# Patient Record
Sex: Female | Born: 1965 | Race: White | Hispanic: No | Smoking: Former smoker
Health system: Southern US, Community
[De-identification: ages and names within clinical notes are randomized; demographics above are authoritative.]

## PROBLEM LIST (undated history)

## (undated) DIAGNOSIS — F99 Mental disorder, not otherwise specified: Secondary | ICD-10-CM

## (undated) DIAGNOSIS — Z789 Other specified health status: Secondary | ICD-10-CM

## (undated) DIAGNOSIS — T4145XA Adverse effect of unspecified anesthetic, initial encounter: Secondary | ICD-10-CM

## (undated) DIAGNOSIS — F319 Bipolar disorder, unspecified: Secondary | ICD-10-CM

## (undated) DIAGNOSIS — R519 Headache, unspecified: Secondary | ICD-10-CM

## (undated) DIAGNOSIS — S022XXA Fracture of nasal bones, initial encounter for closed fracture: Secondary | ICD-10-CM

## (undated) DIAGNOSIS — J309 Allergic rhinitis, unspecified: Secondary | ICD-10-CM

## (undated) DIAGNOSIS — D510 Vitamin B12 deficiency anemia due to intrinsic factor deficiency: Secondary | ICD-10-CM

## (undated) DIAGNOSIS — K219 Gastro-esophageal reflux disease without esophagitis: Secondary | ICD-10-CM

## (undated) DIAGNOSIS — D509 Iron deficiency anemia, unspecified: Secondary | ICD-10-CM

## (undated) DIAGNOSIS — M797 Fibromyalgia: Secondary | ICD-10-CM

## (undated) DIAGNOSIS — R51 Headache: Secondary | ICD-10-CM

## (undated) DIAGNOSIS — D563 Thalassemia minor: Secondary | ICD-10-CM

## (undated) DIAGNOSIS — E119 Type 2 diabetes mellitus without complications: Secondary | ICD-10-CM

## (undated) DIAGNOSIS — G4733 Obstructive sleep apnea (adult) (pediatric): Secondary | ICD-10-CM

## (undated) DIAGNOSIS — Z8489 Family history of other specified conditions: Secondary | ICD-10-CM

## (undated) DIAGNOSIS — F419 Anxiety disorder, unspecified: Secondary | ICD-10-CM

## (undated) DIAGNOSIS — E041 Nontoxic single thyroid nodule: Secondary | ICD-10-CM

## (undated) HISTORY — DX: Iron deficiency anemia, unspecified: D50.9

## (undated) HISTORY — DX: Allergic rhinitis, unspecified: J30.9

## (undated) HISTORY — DX: Nontoxic single thyroid nodule: E04.1

## (undated) HISTORY — DX: Vitamin B12 deficiency anemia due to intrinsic factor deficiency: D51.0

## (undated) HISTORY — PX: OTHER SURGICAL HISTORY: SHX169

## (undated) HISTORY — PX: TUBAL LIGATION: SHX77

## (undated) HISTORY — DX: Morbid (severe) obesity due to excess calories: E66.01

## (undated) HISTORY — DX: Obstructive sleep apnea (adult) (pediatric): G47.33

## (undated) HISTORY — PX: WISDOM TOOTH EXTRACTION: SHX21

## (undated) HISTORY — DX: Bipolar disorder, unspecified: F31.9

---

## 1998-07-10 ENCOUNTER — Inpatient Hospital Stay (HOSPITAL_COMMUNITY): Admission: AD | Admit: 1998-07-10 | Discharge: 1998-07-10 | Payer: Self-pay | Admitting: Obstetrics

## 1998-07-18 ENCOUNTER — Inpatient Hospital Stay (HOSPITAL_COMMUNITY): Admission: AD | Admit: 1998-07-18 | Discharge: 1998-07-18 | Payer: Self-pay | Admitting: *Deleted

## 1998-07-22 ENCOUNTER — Inpatient Hospital Stay (HOSPITAL_COMMUNITY): Admission: AD | Admit: 1998-07-22 | Discharge: 1998-07-22 | Payer: Self-pay | Admitting: Obstetrics & Gynecology

## 1998-08-09 ENCOUNTER — Ambulatory Visit (HOSPITAL_COMMUNITY): Admission: RE | Admit: 1998-08-09 | Discharge: 1998-08-09 | Payer: Self-pay | Admitting: Obstetrics

## 1998-08-10 ENCOUNTER — Encounter: Admission: RE | Admit: 1998-08-10 | Discharge: 1998-08-10 | Payer: Self-pay | Admitting: Obstetrics & Gynecology

## 1998-09-06 ENCOUNTER — Ambulatory Visit (HOSPITAL_COMMUNITY): Admission: RE | Admit: 1998-09-06 | Discharge: 1998-09-06 | Payer: Self-pay | Admitting: Obstetrics & Gynecology

## 1998-09-14 ENCOUNTER — Encounter: Admission: RE | Admit: 1998-09-14 | Discharge: 1998-09-14 | Payer: Self-pay | Admitting: Obstetrics & Gynecology

## 1998-10-12 ENCOUNTER — Encounter: Admission: RE | Admit: 1998-10-12 | Discharge: 1998-10-12 | Payer: Self-pay | Admitting: Obstetrics & Gynecology

## 1998-10-26 ENCOUNTER — Other Ambulatory Visit: Admission: RE | Admit: 1998-10-26 | Discharge: 1998-10-26 | Payer: Self-pay | Admitting: Obstetrics & Gynecology

## 1998-10-26 ENCOUNTER — Encounter: Admission: RE | Admit: 1998-10-26 | Discharge: 1998-10-26 | Payer: Self-pay | Admitting: Obstetrics & Gynecology

## 1998-11-10 ENCOUNTER — Ambulatory Visit (HOSPITAL_COMMUNITY): Admission: RE | Admit: 1998-11-10 | Discharge: 1998-11-10 | Payer: Self-pay | Admitting: Obstetrics & Gynecology

## 1998-12-01 ENCOUNTER — Encounter: Admission: RE | Admit: 1998-12-01 | Discharge: 1998-12-01 | Payer: Self-pay | Admitting: Obstetrics

## 1998-12-09 ENCOUNTER — Inpatient Hospital Stay (HOSPITAL_COMMUNITY): Admission: AD | Admit: 1998-12-09 | Discharge: 1998-12-09 | Payer: Self-pay | Admitting: *Deleted

## 1998-12-10 ENCOUNTER — Inpatient Hospital Stay (HOSPITAL_COMMUNITY): Admission: AD | Admit: 1998-12-10 | Discharge: 1998-12-10 | Payer: Self-pay | Admitting: Obstetrics

## 1998-12-15 ENCOUNTER — Encounter: Admission: RE | Admit: 1998-12-15 | Discharge: 1998-12-15 | Payer: Self-pay | Admitting: Obstetrics

## 1998-12-20 ENCOUNTER — Encounter: Payer: Self-pay | Admitting: *Deleted

## 1998-12-20 ENCOUNTER — Inpatient Hospital Stay (HOSPITAL_COMMUNITY): Admission: AD | Admit: 1998-12-20 | Discharge: 1998-12-20 | Payer: Self-pay | Admitting: *Deleted

## 1998-12-22 ENCOUNTER — Encounter: Admission: RE | Admit: 1998-12-22 | Discharge: 1998-12-22 | Payer: Self-pay | Admitting: Obstetrics

## 1998-12-29 ENCOUNTER — Encounter: Admission: RE | Admit: 1998-12-29 | Discharge: 1998-12-29 | Payer: Self-pay | Admitting: Obstetrics

## 1999-01-04 ENCOUNTER — Encounter: Admission: RE | Admit: 1999-01-04 | Discharge: 1999-01-04 | Payer: Self-pay | Admitting: Obstetrics & Gynecology

## 1999-01-06 ENCOUNTER — Inpatient Hospital Stay (HOSPITAL_COMMUNITY): Admission: AD | Admit: 1999-01-06 | Discharge: 1999-01-06 | Payer: Self-pay | Admitting: *Deleted

## 1999-01-09 ENCOUNTER — Inpatient Hospital Stay (HOSPITAL_COMMUNITY): Admission: AD | Admit: 1999-01-09 | Discharge: 1999-01-12 | Payer: Self-pay | Admitting: Obstetrics & Gynecology

## 1999-08-24 ENCOUNTER — Emergency Department (HOSPITAL_COMMUNITY): Admission: EM | Admit: 1999-08-24 | Discharge: 1999-08-24 | Payer: Self-pay | Admitting: Emergency Medicine

## 1999-08-31 ENCOUNTER — Ambulatory Visit (HOSPITAL_COMMUNITY): Admission: RE | Admit: 1999-08-31 | Discharge: 1999-08-31 | Payer: Self-pay | Admitting: Family Medicine

## 2002-05-26 ENCOUNTER — Inpatient Hospital Stay (HOSPITAL_COMMUNITY): Admission: AD | Admit: 2002-05-26 | Discharge: 2002-05-26 | Payer: Self-pay | Admitting: *Deleted

## 2002-07-17 ENCOUNTER — Encounter (INDEPENDENT_AMBULATORY_CARE_PROVIDER_SITE_OTHER): Payer: Self-pay | Admitting: *Deleted

## 2002-07-17 ENCOUNTER — Inpatient Hospital Stay (HOSPITAL_COMMUNITY): Admission: AD | Admit: 2002-07-17 | Discharge: 2002-07-17 | Payer: Self-pay | Admitting: Family Medicine

## 2002-07-30 DIAGNOSIS — T8859XA Other complications of anesthesia, initial encounter: Secondary | ICD-10-CM

## 2002-07-30 HISTORY — DX: Other complications of anesthesia, initial encounter: T88.59XA

## 2002-07-31 ENCOUNTER — Ambulatory Visit (HOSPITAL_COMMUNITY): Admission: RE | Admit: 2002-07-31 | Discharge: 2002-07-31 | Payer: Self-pay | Admitting: *Deleted

## 2002-08-21 ENCOUNTER — Ambulatory Visit (HOSPITAL_COMMUNITY): Admission: RE | Admit: 2002-08-21 | Discharge: 2002-08-21 | Payer: Self-pay | Admitting: *Deleted

## 2002-09-02 ENCOUNTER — Encounter: Admission: RE | Admit: 2002-09-02 | Discharge: 2002-09-02 | Payer: Self-pay | Admitting: *Deleted

## 2002-09-08 ENCOUNTER — Encounter: Admission: RE | Admit: 2002-09-08 | Discharge: 2002-09-08 | Payer: Self-pay | Admitting: *Deleted

## 2002-09-09 ENCOUNTER — Encounter: Admission: RE | Admit: 2002-09-09 | Discharge: 2002-09-09 | Payer: Self-pay | Admitting: *Deleted

## 2002-09-23 ENCOUNTER — Encounter: Admission: RE | Admit: 2002-09-23 | Discharge: 2002-09-23 | Payer: Self-pay | Admitting: *Deleted

## 2002-10-07 ENCOUNTER — Encounter: Admission: RE | Admit: 2002-10-07 | Discharge: 2002-10-07 | Payer: Self-pay | Admitting: *Deleted

## 2002-10-14 ENCOUNTER — Inpatient Hospital Stay (HOSPITAL_COMMUNITY): Admission: AD | Admit: 2002-10-14 | Discharge: 2002-10-14 | Payer: Self-pay | Admitting: *Deleted

## 2002-10-15 ENCOUNTER — Inpatient Hospital Stay (HOSPITAL_COMMUNITY): Admission: AD | Admit: 2002-10-15 | Discharge: 2002-10-15 | Payer: Self-pay | Admitting: Family Medicine

## 2002-10-21 ENCOUNTER — Encounter: Admission: RE | Admit: 2002-10-21 | Discharge: 2002-10-21 | Payer: Self-pay | Admitting: *Deleted

## 2002-11-04 ENCOUNTER — Encounter: Admission: RE | Admit: 2002-11-04 | Discharge: 2002-11-04 | Payer: Self-pay | Admitting: *Deleted

## 2002-11-11 ENCOUNTER — Ambulatory Visit (HOSPITAL_COMMUNITY): Admission: RE | Admit: 2002-11-11 | Discharge: 2002-11-11 | Payer: Self-pay | Admitting: *Deleted

## 2002-11-11 ENCOUNTER — Encounter: Admission: RE | Admit: 2002-11-11 | Discharge: 2002-11-11 | Payer: Self-pay | Admitting: *Deleted

## 2002-11-11 ENCOUNTER — Encounter: Payer: Self-pay | Admitting: *Deleted

## 2002-11-12 ENCOUNTER — Inpatient Hospital Stay (HOSPITAL_COMMUNITY): Admission: AD | Admit: 2002-11-12 | Discharge: 2002-11-15 | Payer: Self-pay | Admitting: Obstetrics and Gynecology

## 2002-11-12 ENCOUNTER — Encounter (INDEPENDENT_AMBULATORY_CARE_PROVIDER_SITE_OTHER): Payer: Self-pay

## 2002-11-17 ENCOUNTER — Inpatient Hospital Stay (HOSPITAL_COMMUNITY): Admission: AD | Admit: 2002-11-17 | Discharge: 2002-11-17 | Payer: Self-pay | Admitting: *Deleted

## 2002-11-19 ENCOUNTER — Inpatient Hospital Stay (HOSPITAL_COMMUNITY): Admission: AD | Admit: 2002-11-19 | Discharge: 2002-11-19 | Payer: Self-pay | Admitting: Family Medicine

## 2002-11-25 ENCOUNTER — Inpatient Hospital Stay (HOSPITAL_COMMUNITY): Admission: AD | Admit: 2002-11-25 | Discharge: 2002-11-25 | Payer: Self-pay | Admitting: Obstetrics and Gynecology

## 2003-01-26 ENCOUNTER — Inpatient Hospital Stay (HOSPITAL_COMMUNITY): Admission: AD | Admit: 2003-01-26 | Discharge: 2003-01-26 | Payer: Self-pay | Admitting: *Deleted

## 2004-05-10 ENCOUNTER — Ambulatory Visit: Payer: Self-pay | Admitting: Family Medicine

## 2004-05-23 ENCOUNTER — Ambulatory Visit: Payer: Self-pay | Admitting: Nurse Practitioner

## 2004-06-13 ENCOUNTER — Ambulatory Visit: Payer: Self-pay | Admitting: Family Medicine

## 2005-04-24 ENCOUNTER — Ambulatory Visit: Payer: Self-pay | Admitting: Internal Medicine

## 2005-05-01 ENCOUNTER — Ambulatory Visit: Payer: Self-pay | Admitting: *Deleted

## 2005-09-06 ENCOUNTER — Ambulatory Visit: Payer: Self-pay | Admitting: Family Medicine

## 2005-11-13 ENCOUNTER — Ambulatory Visit: Payer: Self-pay | Admitting: Family Medicine

## 2005-11-20 ENCOUNTER — Ambulatory Visit: Payer: Self-pay | Admitting: Internal Medicine

## 2005-11-21 ENCOUNTER — Ambulatory Visit (HOSPITAL_COMMUNITY): Admission: RE | Admit: 2005-11-21 | Discharge: 2005-11-21 | Payer: Self-pay | Admitting: Family Medicine

## 2005-12-25 ENCOUNTER — Ambulatory Visit: Payer: Self-pay | Admitting: Family Medicine

## 2005-12-25 ENCOUNTER — Encounter (INDEPENDENT_AMBULATORY_CARE_PROVIDER_SITE_OTHER): Payer: Self-pay | Admitting: *Deleted

## 2005-12-25 ENCOUNTER — Ambulatory Visit: Payer: Self-pay | Admitting: *Deleted

## 2005-12-27 ENCOUNTER — Ambulatory Visit (HOSPITAL_COMMUNITY): Admission: RE | Admit: 2005-12-27 | Discharge: 2005-12-27 | Payer: Self-pay | Admitting: Family Medicine

## 2006-01-07 ENCOUNTER — Ambulatory Visit: Payer: Self-pay | Admitting: Family Medicine

## 2006-01-16 ENCOUNTER — Ambulatory Visit (HOSPITAL_COMMUNITY): Admission: RE | Admit: 2006-01-16 | Discharge: 2006-01-16 | Payer: Self-pay | Admitting: Family Medicine

## 2006-02-15 ENCOUNTER — Emergency Department (HOSPITAL_COMMUNITY): Admission: EM | Admit: 2006-02-15 | Discharge: 2006-02-15 | Payer: Self-pay | Admitting: Emergency Medicine

## 2006-02-18 ENCOUNTER — Ambulatory Visit: Payer: Self-pay | Admitting: Family Medicine

## 2006-02-20 ENCOUNTER — Ambulatory Visit: Payer: Self-pay | Admitting: Family Medicine

## 2006-03-14 ENCOUNTER — Ambulatory Visit (HOSPITAL_COMMUNITY): Admission: RE | Admit: 2006-03-14 | Discharge: 2006-03-14 | Payer: Self-pay | Admitting: Family Medicine

## 2006-03-14 ENCOUNTER — Ambulatory Visit: Payer: Self-pay | Admitting: Family Medicine

## 2006-05-13 ENCOUNTER — Ambulatory Visit: Payer: Self-pay | Admitting: Family Medicine

## 2006-05-15 ENCOUNTER — Ambulatory Visit (HOSPITAL_COMMUNITY): Admission: RE | Admit: 2006-05-15 | Discharge: 2006-05-15 | Payer: Self-pay | Admitting: Family Medicine

## 2006-06-25 ENCOUNTER — Ambulatory Visit: Payer: Self-pay | Admitting: Family Medicine

## 2006-11-21 ENCOUNTER — Ambulatory Visit: Payer: Self-pay | Admitting: Family Medicine

## 2006-12-25 ENCOUNTER — Emergency Department (HOSPITAL_COMMUNITY): Admission: EM | Admit: 2006-12-25 | Discharge: 2006-12-25 | Payer: Self-pay | Admitting: Emergency Medicine

## 2007-03-02 ENCOUNTER — Emergency Department (HOSPITAL_COMMUNITY): Admission: EM | Admit: 2007-03-02 | Discharge: 2007-03-02 | Payer: Self-pay | Admitting: Emergency Medicine

## 2007-03-27 ENCOUNTER — Ambulatory Visit: Payer: Self-pay | Admitting: Family Medicine

## 2007-03-27 LAB — CONVERTED CEMR LAB
Free T4: 1.07 ng/dL (ref 0.89–1.80)
Lithium Lvl: 0.4 meq/L — ABNORMAL LOW (ref 0.80–1.40)
T3, Total: 135.5 ng/dL (ref 60.0–181.0)

## 2007-04-16 ENCOUNTER — Encounter (INDEPENDENT_AMBULATORY_CARE_PROVIDER_SITE_OTHER): Payer: Self-pay | Admitting: *Deleted

## 2007-11-03 ENCOUNTER — Ambulatory Visit: Payer: Self-pay | Admitting: Internal Medicine

## 2007-12-23 ENCOUNTER — Emergency Department (HOSPITAL_COMMUNITY): Admission: EM | Admit: 2007-12-23 | Discharge: 2007-12-23 | Payer: Self-pay | Admitting: Emergency Medicine

## 2008-02-09 ENCOUNTER — Emergency Department (HOSPITAL_COMMUNITY): Admission: EM | Admit: 2008-02-09 | Discharge: 2008-02-09 | Payer: Self-pay | Admitting: Emergency Medicine

## 2008-02-16 ENCOUNTER — Ambulatory Visit: Payer: Self-pay | Admitting: Internal Medicine

## 2008-07-18 ENCOUNTER — Emergency Department (HOSPITAL_COMMUNITY): Admission: EM | Admit: 2008-07-18 | Discharge: 2008-07-18 | Payer: Self-pay | Admitting: Emergency Medicine

## 2008-07-29 ENCOUNTER — Ambulatory Visit: Payer: Self-pay | Admitting: Family Medicine

## 2008-07-29 LAB — CONVERTED CEMR LAB
AST: 21 units/L (ref 0–37)
Albumin: 4 g/dL (ref 3.5–5.2)
Alkaline Phosphatase: 72 units/L (ref 39–117)
HDL: 49 mg/dL (ref >39)
LDL Cholesterol: 84 mg/dL (ref 0–99)
Lithium Lvl: 0.51 meq/L — ABNORMAL LOW (ref 0.80–1.40)
Potassium: 4.6 meq/L (ref 3.5–5.3)
Sodium: 141 meq/L (ref 135–145)
TSH: 2.518 microintl units/mL (ref 0.350–4.50)
Total Bilirubin: 0.5 mg/dL (ref 0.3–1.2)
Total Protein: 7.6 g/dL (ref 6.0–8.3)
VLDL: 21 mg/dL (ref 0–40)

## 2008-08-26 ENCOUNTER — Ambulatory Visit: Payer: Self-pay | Admitting: Internal Medicine

## 2008-08-27 ENCOUNTER — Encounter (INDEPENDENT_AMBULATORY_CARE_PROVIDER_SITE_OTHER): Payer: Self-pay | Admitting: Family Medicine

## 2008-11-20 ENCOUNTER — Emergency Department (HOSPITAL_COMMUNITY): Admission: EM | Admit: 2008-11-20 | Discharge: 2008-11-20 | Payer: Self-pay | Admitting: Emergency Medicine

## 2008-11-24 ENCOUNTER — Ambulatory Visit: Payer: Self-pay | Admitting: Family Medicine

## 2008-11-26 ENCOUNTER — Ambulatory Visit: Payer: Self-pay | Admitting: Family Medicine

## 2008-12-08 ENCOUNTER — Ambulatory Visit (HOSPITAL_COMMUNITY): Admission: RE | Admit: 2008-12-08 | Discharge: 2008-12-08 | Payer: Self-pay | Admitting: Family Medicine

## 2008-12-10 ENCOUNTER — Emergency Department (HOSPITAL_COMMUNITY): Admission: EM | Admit: 2008-12-10 | Discharge: 2008-12-10 | Payer: Self-pay | Admitting: Family Medicine

## 2008-12-13 ENCOUNTER — Encounter: Payer: Self-pay | Admitting: Internal Medicine

## 2008-12-13 ENCOUNTER — Ambulatory Visit: Payer: Self-pay | Admitting: Family Medicine

## 2008-12-13 DIAGNOSIS — F319 Bipolar disorder, unspecified: Secondary | ICD-10-CM | POA: Insufficient documentation

## 2008-12-13 DIAGNOSIS — IMO0001 Reserved for inherently not codable concepts without codable children: Secondary | ICD-10-CM | POA: Insufficient documentation

## 2008-12-21 ENCOUNTER — Ambulatory Visit: Payer: Self-pay | Admitting: Family Medicine

## 2008-12-21 ENCOUNTER — Encounter: Payer: Self-pay | Admitting: Internal Medicine

## 2008-12-22 ENCOUNTER — Ambulatory Visit: Payer: Self-pay | Admitting: Internal Medicine

## 2008-12-22 DIAGNOSIS — R0602 Shortness of breath: Secondary | ICD-10-CM | POA: Insufficient documentation

## 2008-12-26 ENCOUNTER — Emergency Department (HOSPITAL_COMMUNITY): Admission: EM | Admit: 2008-12-26 | Discharge: 2008-12-26 | Payer: Self-pay | Admitting: Family Medicine

## 2009-01-02 ENCOUNTER — Encounter (INDEPENDENT_AMBULATORY_CARE_PROVIDER_SITE_OTHER): Payer: Self-pay | Admitting: Emergency Medicine

## 2009-01-02 ENCOUNTER — Observation Stay (HOSPITAL_COMMUNITY): Admission: EM | Admit: 2009-01-02 | Discharge: 2009-01-02 | Payer: Self-pay | Admitting: Emergency Medicine

## 2009-01-02 ENCOUNTER — Ambulatory Visit: Payer: Self-pay | Admitting: Vascular Surgery

## 2009-01-04 ENCOUNTER — Ambulatory Visit: Payer: Self-pay | Admitting: Family Medicine

## 2009-01-05 ENCOUNTER — Encounter: Payer: Self-pay | Admitting: Internal Medicine

## 2009-01-05 ENCOUNTER — Ambulatory Visit (HOSPITAL_COMMUNITY): Admission: RE | Admit: 2009-01-05 | Discharge: 2009-01-05 | Payer: Self-pay | Admitting: Internal Medicine

## 2009-01-05 ENCOUNTER — Emergency Department (HOSPITAL_COMMUNITY): Admission: EM | Admit: 2009-01-05 | Discharge: 2009-01-05 | Payer: Self-pay | Admitting: Emergency Medicine

## 2009-01-07 ENCOUNTER — Emergency Department (HOSPITAL_COMMUNITY): Admission: EM | Admit: 2009-01-07 | Discharge: 2009-01-07 | Payer: Self-pay | Admitting: Emergency Medicine

## 2009-01-10 ENCOUNTER — Ambulatory Visit: Payer: Self-pay | Admitting: Internal Medicine

## 2009-01-11 ENCOUNTER — Ambulatory Visit: Payer: Self-pay | Admitting: Internal Medicine

## 2009-01-13 ENCOUNTER — Ambulatory Visit (HOSPITAL_BASED_OUTPATIENT_CLINIC_OR_DEPARTMENT_OTHER): Admission: RE | Admit: 2009-01-13 | Discharge: 2009-01-13 | Payer: Self-pay | Admitting: Internal Medicine

## 2009-01-18 ENCOUNTER — Ambulatory Visit: Payer: Self-pay | Admitting: Pulmonary Disease

## 2009-01-19 ENCOUNTER — Emergency Department (HOSPITAL_COMMUNITY): Admission: EM | Admit: 2009-01-19 | Discharge: 2009-01-19 | Payer: Self-pay | Admitting: Emergency Medicine

## 2009-01-21 ENCOUNTER — Ambulatory Visit: Payer: Self-pay | Admitting: Internal Medicine

## 2009-01-23 ENCOUNTER — Emergency Department (HOSPITAL_COMMUNITY): Admission: EM | Admit: 2009-01-23 | Discharge: 2009-01-23 | Payer: Self-pay | Admitting: Emergency Medicine

## 2009-01-25 ENCOUNTER — Telehealth (INDEPENDENT_AMBULATORY_CARE_PROVIDER_SITE_OTHER): Payer: Self-pay | Admitting: *Deleted

## 2009-01-25 ENCOUNTER — Emergency Department (HOSPITAL_COMMUNITY): Admission: EM | Admit: 2009-01-25 | Discharge: 2009-01-25 | Payer: Self-pay | Admitting: Emergency Medicine

## 2009-01-26 ENCOUNTER — Emergency Department (HOSPITAL_COMMUNITY): Admission: EM | Admit: 2009-01-26 | Discharge: 2009-01-26 | Payer: Self-pay | Admitting: Emergency Medicine

## 2009-01-26 ENCOUNTER — Ambulatory Visit: Payer: Self-pay | Admitting: Pulmonary Disease

## 2009-01-26 DIAGNOSIS — G4730 Sleep apnea, unspecified: Secondary | ICD-10-CM | POA: Insufficient documentation

## 2009-01-27 DIAGNOSIS — J309 Allergic rhinitis, unspecified: Secondary | ICD-10-CM | POA: Insufficient documentation

## 2009-01-28 ENCOUNTER — Telehealth: Payer: Self-pay | Admitting: Internal Medicine

## 2009-01-29 ENCOUNTER — Emergency Department (HOSPITAL_COMMUNITY): Admission: EM | Admit: 2009-01-29 | Discharge: 2009-01-29 | Payer: Self-pay | Admitting: Emergency Medicine

## 2009-01-31 ENCOUNTER — Encounter: Payer: Self-pay | Admitting: Pulmonary Disease

## 2009-02-01 ENCOUNTER — Ambulatory Visit: Payer: Self-pay | Admitting: Internal Medicine

## 2009-02-01 ENCOUNTER — Telehealth (INDEPENDENT_AMBULATORY_CARE_PROVIDER_SITE_OTHER): Payer: Self-pay | Admitting: *Deleted

## 2009-02-14 ENCOUNTER — Encounter: Payer: Self-pay | Admitting: Pulmonary Disease

## 2009-02-21 ENCOUNTER — Ambulatory Visit: Payer: Self-pay | Admitting: Internal Medicine

## 2009-02-24 ENCOUNTER — Observation Stay (HOSPITAL_COMMUNITY): Admission: EM | Admit: 2009-02-24 | Discharge: 2009-02-25 | Payer: Self-pay | Admitting: Emergency Medicine

## 2009-02-24 ENCOUNTER — Ambulatory Visit: Payer: Self-pay | Admitting: Family Medicine

## 2009-02-28 ENCOUNTER — Ambulatory Visit (HOSPITAL_COMMUNITY): Admission: RE | Admit: 2009-02-28 | Discharge: 2009-02-28 | Payer: Self-pay | Admitting: Internal Medicine

## 2009-02-28 ENCOUNTER — Encounter: Payer: Self-pay | Admitting: Internal Medicine

## 2009-03-01 ENCOUNTER — Ambulatory Visit: Payer: Self-pay | Admitting: Family Medicine

## 2009-03-03 ENCOUNTER — Ambulatory Visit: Payer: Self-pay | Admitting: Pulmonary Disease

## 2009-03-04 ENCOUNTER — Ambulatory Visit: Payer: Self-pay | Admitting: Internal Medicine

## 2009-03-04 DIAGNOSIS — E041 Nontoxic single thyroid nodule: Secondary | ICD-10-CM | POA: Insufficient documentation

## 2009-03-09 ENCOUNTER — Ambulatory Visit (HOSPITAL_COMMUNITY): Admission: RE | Admit: 2009-03-09 | Discharge: 2009-03-09 | Payer: Self-pay | Admitting: Internal Medicine

## 2009-03-14 ENCOUNTER — Telehealth: Payer: Self-pay | Admitting: Internal Medicine

## 2009-03-15 ENCOUNTER — Telehealth: Payer: Self-pay | Admitting: Internal Medicine

## 2009-03-16 ENCOUNTER — Telehealth (INDEPENDENT_AMBULATORY_CARE_PROVIDER_SITE_OTHER): Payer: Self-pay | Admitting: *Deleted

## 2009-03-17 ENCOUNTER — Telehealth: Payer: Self-pay | Admitting: Pulmonary Disease

## 2009-03-17 ENCOUNTER — Encounter: Payer: Self-pay | Admitting: Pulmonary Disease

## 2009-03-23 ENCOUNTER — Ambulatory Visit: Admission: RE | Admit: 2009-03-23 | Discharge: 2009-03-23 | Payer: Self-pay | Admitting: Internal Medicine

## 2009-03-24 ENCOUNTER — Encounter: Payer: Self-pay | Admitting: Internal Medicine

## 2009-03-24 DIAGNOSIS — J383 Other diseases of vocal cords: Secondary | ICD-10-CM | POA: Insufficient documentation

## 2009-04-01 ENCOUNTER — Ambulatory Visit (HOSPITAL_COMMUNITY): Admission: RE | Admit: 2009-04-01 | Discharge: 2009-04-01 | Payer: Self-pay | Admitting: Family Medicine

## 2009-04-01 ENCOUNTER — Encounter (INDEPENDENT_AMBULATORY_CARE_PROVIDER_SITE_OTHER): Payer: Self-pay | Admitting: Interventional Radiology

## 2009-04-01 ENCOUNTER — Encounter: Payer: Self-pay | Admitting: Internal Medicine

## 2009-04-14 ENCOUNTER — Telehealth (INDEPENDENT_AMBULATORY_CARE_PROVIDER_SITE_OTHER): Payer: Self-pay | Admitting: *Deleted

## 2009-04-14 ENCOUNTER — Ambulatory Visit: Payer: Self-pay | Admitting: Family Medicine

## 2009-04-16 ENCOUNTER — Emergency Department (HOSPITAL_COMMUNITY): Admission: EM | Admit: 2009-04-16 | Discharge: 2009-04-16 | Payer: Self-pay | Admitting: Emergency Medicine

## 2009-04-22 ENCOUNTER — Encounter (INDEPENDENT_AMBULATORY_CARE_PROVIDER_SITE_OTHER): Payer: Self-pay | Admitting: Adult Health

## 2009-04-22 ENCOUNTER — Ambulatory Visit: Payer: Self-pay | Admitting: Internal Medicine

## 2009-04-22 ENCOUNTER — Telehealth (INDEPENDENT_AMBULATORY_CARE_PROVIDER_SITE_OTHER): Payer: Self-pay | Admitting: *Deleted

## 2009-04-22 LAB — CONVERTED CEMR LAB
Basophils Absolute: 0 10*3/uL (ref 0.0–0.1)
Eosinophils Absolute: 0.1 10*3/uL (ref 0.0–0.7)
Eosinophils Relative: 1 % (ref 0–5)
Lymphs Abs: 1.2 10*3/uL (ref 0.7–4.0)
MCV: 79.2 fL (ref 78.0–100.0)
Neutrophils Relative %: 80 % — ABNORMAL HIGH (ref 43–77)
Platelets: 341 10*3/uL (ref 150–400)
RDW: 14.4 % (ref 11.5–15.5)
WBC: 9.8 10*3/uL (ref 4.0–10.5)

## 2009-04-24 ENCOUNTER — Encounter (INDEPENDENT_AMBULATORY_CARE_PROVIDER_SITE_OTHER): Payer: Self-pay | Admitting: Emergency Medicine

## 2009-04-24 ENCOUNTER — Emergency Department (HOSPITAL_COMMUNITY): Admission: EM | Admit: 2009-04-24 | Discharge: 2009-04-24 | Payer: Self-pay | Admitting: Emergency Medicine

## 2009-04-26 ENCOUNTER — Ambulatory Visit: Payer: Self-pay | Admitting: Internal Medicine

## 2009-04-27 ENCOUNTER — Ambulatory Visit: Payer: Self-pay | Admitting: Vascular Surgery

## 2009-04-28 ENCOUNTER — Ambulatory Visit: Payer: Self-pay | Admitting: Family Medicine

## 2009-04-29 ENCOUNTER — Ambulatory Visit: Payer: Self-pay | Admitting: Family Medicine

## 2009-05-04 ENCOUNTER — Telehealth: Payer: Self-pay | Admitting: Internal Medicine

## 2009-05-05 ENCOUNTER — Ambulatory Visit: Payer: Self-pay | Admitting: Pulmonary Disease

## 2009-05-10 ENCOUNTER — Inpatient Hospital Stay (HOSPITAL_COMMUNITY): Admission: AD | Admit: 2009-05-10 | Discharge: 2009-05-10 | Payer: Self-pay | Admitting: Obstetrics & Gynecology

## 2009-05-11 ENCOUNTER — Encounter (INDEPENDENT_AMBULATORY_CARE_PROVIDER_SITE_OTHER): Payer: Self-pay | Admitting: *Deleted

## 2009-05-11 ENCOUNTER — Emergency Department (HOSPITAL_COMMUNITY): Admission: EM | Admit: 2009-05-11 | Discharge: 2009-05-11 | Payer: Self-pay | Admitting: Emergency Medicine

## 2009-05-12 ENCOUNTER — Telehealth (INDEPENDENT_AMBULATORY_CARE_PROVIDER_SITE_OTHER): Payer: Self-pay | Admitting: *Deleted

## 2009-05-12 ENCOUNTER — Ambulatory Visit: Payer: Self-pay | Admitting: Family Medicine

## 2009-05-13 ENCOUNTER — Telehealth (INDEPENDENT_AMBULATORY_CARE_PROVIDER_SITE_OTHER): Payer: Self-pay | Admitting: *Deleted

## 2009-06-17 ENCOUNTER — Telehealth (INDEPENDENT_AMBULATORY_CARE_PROVIDER_SITE_OTHER): Payer: Self-pay | Admitting: *Deleted

## 2009-06-24 ENCOUNTER — Emergency Department (HOSPITAL_COMMUNITY): Admission: EM | Admit: 2009-06-24 | Discharge: 2009-06-24 | Payer: Self-pay | Admitting: Family Medicine

## 2009-06-28 ENCOUNTER — Ambulatory Visit: Payer: Self-pay | Admitting: Family Medicine

## 2009-06-28 ENCOUNTER — Ambulatory Visit (HOSPITAL_COMMUNITY): Admission: RE | Admit: 2009-06-28 | Discharge: 2009-06-28 | Payer: Self-pay | Admitting: Family Medicine

## 2009-07-06 ENCOUNTER — Telehealth: Payer: Self-pay | Admitting: Internal Medicine

## 2009-07-06 ENCOUNTER — Encounter: Admission: RE | Admit: 2009-07-06 | Discharge: 2009-07-27 | Payer: Self-pay | Admitting: Internal Medicine

## 2009-07-06 ENCOUNTER — Encounter (INDEPENDENT_AMBULATORY_CARE_PROVIDER_SITE_OTHER): Payer: Self-pay | Admitting: *Deleted

## 2009-07-06 ENCOUNTER — Ambulatory Visit: Payer: Self-pay | Admitting: Internal Medicine

## 2009-07-06 DIAGNOSIS — K900 Celiac disease: Secondary | ICD-10-CM | POA: Insufficient documentation

## 2009-07-06 DIAGNOSIS — J019 Acute sinusitis, unspecified: Secondary | ICD-10-CM | POA: Insufficient documentation

## 2009-07-08 ENCOUNTER — Telehealth: Payer: Self-pay | Admitting: Internal Medicine

## 2009-07-08 ENCOUNTER — Telehealth (INDEPENDENT_AMBULATORY_CARE_PROVIDER_SITE_OTHER): Payer: Self-pay | Admitting: *Deleted

## 2009-07-13 ENCOUNTER — Telehealth (INDEPENDENT_AMBULATORY_CARE_PROVIDER_SITE_OTHER): Payer: Self-pay | Admitting: *Deleted

## 2009-08-01 ENCOUNTER — Ambulatory Visit: Payer: Self-pay | Admitting: Internal Medicine

## 2009-08-01 ENCOUNTER — Telehealth (INDEPENDENT_AMBULATORY_CARE_PROVIDER_SITE_OTHER): Payer: Self-pay | Admitting: *Deleted

## 2009-08-01 ENCOUNTER — Encounter: Admission: RE | Admit: 2009-08-01 | Discharge: 2009-10-30 | Payer: Self-pay | Admitting: Internal Medicine

## 2009-08-05 ENCOUNTER — Ambulatory Visit: Payer: Self-pay | Admitting: Gastroenterology

## 2009-08-05 DIAGNOSIS — R197 Diarrhea, unspecified: Secondary | ICD-10-CM | POA: Insufficient documentation

## 2009-08-05 DIAGNOSIS — F1020 Alcohol dependence, uncomplicated: Secondary | ICD-10-CM | POA: Insufficient documentation

## 2009-08-05 LAB — CONVERTED CEMR LAB
ALT: 37 units/L — ABNORMAL HIGH (ref 0–35)
AST: 30 units/L (ref 0–37)
Basophils Absolute: 0 10*3/uL (ref 0.0–0.1)
CO2: 27 meq/L (ref 19–32)
Calcium: 9.1 mg/dL (ref 8.4–10.5)
Chloride: 111 meq/L (ref 96–112)
Creatinine, Ser: 0.5 mg/dL (ref 0.4–1.2)
Eosinophils Absolute: 0.1 10*3/uL (ref 0.0–0.7)
GFR calc non Af Amer: 143.92 mL/min (ref >60)
Hemoglobin: 11.5 g/dL — ABNORMAL LOW (ref 12.0–15.0)
Lymphocytes Relative: 11.9 % — ABNORMAL LOW (ref 12.0–46.0)
MCHC: 32.4 g/dL (ref 30.0–36.0)
Monocytes Relative: 2.8 % — ABNORMAL LOW (ref 3.0–12.0)
Neutrophils Relative %: 83.8 % — ABNORMAL HIGH (ref 43.0–77.0)
Platelets: 269 10*3/uL (ref 150.0–400.0)
Potassium: 4.4 meq/L (ref 3.5–5.1)
RDW: 15.6 % — ABNORMAL HIGH (ref 11.5–14.6)
Sodium: 142 meq/L (ref 135–145)
Total Protein: 7.6 g/dL (ref 6.0–8.3)
aPTT: 31 s — ABNORMAL HIGH

## 2009-08-10 ENCOUNTER — Ambulatory Visit: Payer: Self-pay | Admitting: Emergency Medicine

## 2009-08-10 ENCOUNTER — Telehealth (INDEPENDENT_AMBULATORY_CARE_PROVIDER_SITE_OTHER): Payer: Self-pay | Admitting: *Deleted

## 2009-08-10 DIAGNOSIS — J209 Acute bronchitis, unspecified: Secondary | ICD-10-CM | POA: Insufficient documentation

## 2009-08-12 ENCOUNTER — Encounter: Payer: Self-pay | Admitting: Internal Medicine

## 2009-08-14 ENCOUNTER — Emergency Department (HOSPITAL_COMMUNITY): Admission: EM | Admit: 2009-08-14 | Discharge: 2009-08-14 | Payer: Self-pay | Admitting: Emergency Medicine

## 2009-08-16 ENCOUNTER — Telehealth: Payer: Self-pay | Admitting: Emergency Medicine

## 2009-08-22 ENCOUNTER — Encounter: Payer: Self-pay | Admitting: Internal Medicine

## 2009-08-26 ENCOUNTER — Encounter: Payer: Self-pay | Admitting: Adult Health

## 2009-08-31 ENCOUNTER — Ambulatory Visit: Payer: Self-pay | Admitting: Internal Medicine

## 2009-09-02 ENCOUNTER — Telehealth: Payer: Self-pay | Admitting: Internal Medicine

## 2009-09-05 ENCOUNTER — Ambulatory Visit: Payer: Self-pay | Admitting: Internal Medicine

## 2009-09-17 ENCOUNTER — Emergency Department (HOSPITAL_COMMUNITY): Admission: EM | Admit: 2009-09-17 | Discharge: 2009-09-17 | Payer: Self-pay | Admitting: Emergency Medicine

## 2009-09-19 ENCOUNTER — Emergency Department (HOSPITAL_COMMUNITY): Admission: EM | Admit: 2009-09-19 | Discharge: 2009-09-19 | Payer: Self-pay | Admitting: Emergency Medicine

## 2009-09-19 ENCOUNTER — Telehealth (INDEPENDENT_AMBULATORY_CARE_PROVIDER_SITE_OTHER): Payer: Self-pay | Admitting: *Deleted

## 2009-09-20 ENCOUNTER — Ambulatory Visit: Payer: Self-pay | Admitting: Family Medicine

## 2009-09-30 ENCOUNTER — Ambulatory Visit: Payer: Self-pay | Admitting: Internal Medicine

## 2009-09-30 DIAGNOSIS — R21 Rash and other nonspecific skin eruption: Secondary | ICD-10-CM | POA: Insufficient documentation

## 2009-09-30 LAB — CONVERTED CEMR LAB: IgE (Immunoglobulin E), Serum: 12.2 intl units/mL (ref 0.0–180.0)

## 2009-11-11 ENCOUNTER — Ambulatory Visit: Payer: Self-pay | Admitting: Family Medicine

## 2009-11-15 ENCOUNTER — Ambulatory Visit: Payer: Self-pay | Admitting: Family Medicine

## 2009-11-15 LAB — CONVERTED CEMR LAB
AST: 21 units/L (ref 0–37)
Albumin: 4.1 g/dL (ref 3.5–5.2)
Alkaline Phosphatase: 99 units/L (ref 39–117)
Basophils Absolute: 0 10*3/uL (ref 0.0–0.1)
Calcium: 8.7 mg/dL (ref 8.4–10.5)
Chloride: 112 meq/L (ref 96–112)
HCT: 38.3 % (ref 36.0–46.0)
Hemoglobin: 12 g/dL (ref 12.0–15.0)
Lymphocytes Relative: 12 % (ref 12–46)
Lymphs Abs: 0.8 10*3/uL (ref 0.7–4.0)
Monocytes Absolute: 0.4 10*3/uL (ref 0.1–1.0)
Neutro Abs: 5.5 10*3/uL (ref 1.7–7.7)
Platelets: 270 10*3/uL (ref 150–400)
Potassium: 4.3 meq/L (ref 3.5–5.3)
RDW: 16 % — ABNORMAL HIGH (ref 11.5–15.5)
Sodium: 140 meq/L (ref 135–145)
Total Protein: 7.1 g/dL (ref 6.0–8.3)
Vit D, 25-Hydroxy: 27 ng/mL — ABNORMAL LOW (ref 30–89)
WBC: 6.9 10*3/uL (ref 4.0–10.5)

## 2009-11-23 ENCOUNTER — Telehealth (INDEPENDENT_AMBULATORY_CARE_PROVIDER_SITE_OTHER): Payer: Self-pay | Admitting: *Deleted

## 2009-11-29 ENCOUNTER — Ambulatory Visit: Payer: Self-pay | Admitting: Family Medicine

## 2009-11-30 ENCOUNTER — Telehealth (INDEPENDENT_AMBULATORY_CARE_PROVIDER_SITE_OTHER): Payer: Self-pay | Admitting: *Deleted

## 2009-11-30 ENCOUNTER — Ambulatory Visit: Payer: Self-pay | Admitting: Internal Medicine

## 2009-12-02 ENCOUNTER — Telehealth: Payer: Self-pay | Admitting: Internal Medicine

## 2009-12-06 ENCOUNTER — Encounter: Admission: RE | Admit: 2009-12-06 | Discharge: 2009-12-06 | Payer: Self-pay | Admitting: Family Medicine

## 2010-01-04 ENCOUNTER — Emergency Department (HOSPITAL_COMMUNITY): Admission: EM | Admit: 2010-01-04 | Discharge: 2010-01-05 | Payer: Self-pay | Admitting: Emergency Medicine

## 2010-01-16 ENCOUNTER — Inpatient Hospital Stay (HOSPITAL_COMMUNITY): Admission: AD | Admit: 2010-01-16 | Discharge: 2010-01-16 | Payer: Self-pay | Admitting: Obstetrics & Gynecology

## 2010-01-16 ENCOUNTER — Ambulatory Visit: Payer: Self-pay | Admitting: Advanced Practice Midwife

## 2010-01-18 ENCOUNTER — Ambulatory Visit: Payer: Self-pay | Admitting: Family Medicine

## 2010-01-26 ENCOUNTER — Ambulatory Visit: Payer: Self-pay | Admitting: Family Medicine

## 2010-01-26 ENCOUNTER — Ambulatory Visit (HOSPITAL_COMMUNITY): Admission: RE | Admit: 2010-01-26 | Discharge: 2010-01-26 | Payer: Self-pay | Admitting: Family Medicine

## 2010-01-31 ENCOUNTER — Emergency Department (HOSPITAL_COMMUNITY): Admission: EM | Admit: 2010-01-31 | Discharge: 2010-01-31 | Payer: Self-pay | Admitting: Family Medicine

## 2010-02-03 ENCOUNTER — Ambulatory Visit: Payer: Self-pay | Admitting: Family Medicine

## 2010-02-10 ENCOUNTER — Ambulatory Visit: Payer: Self-pay | Admitting: Internal Medicine

## 2010-03-04 ENCOUNTER — Emergency Department (HOSPITAL_COMMUNITY): Admission: EM | Admit: 2010-03-04 | Discharge: 2010-03-04 | Payer: Self-pay | Admitting: Emergency Medicine

## 2010-03-11 ENCOUNTER — Emergency Department (HOSPITAL_COMMUNITY): Admission: EM | Admit: 2010-03-11 | Discharge: 2010-03-11 | Payer: Self-pay | Admitting: Emergency Medicine

## 2010-03-13 ENCOUNTER — Ambulatory Visit: Payer: Self-pay | Admitting: Internal Medicine

## 2010-03-14 ENCOUNTER — Telehealth (INDEPENDENT_AMBULATORY_CARE_PROVIDER_SITE_OTHER): Payer: Self-pay | Admitting: *Deleted

## 2010-03-23 ENCOUNTER — Ambulatory Visit: Payer: Self-pay | Admitting: Family Medicine

## 2010-03-23 ENCOUNTER — Telehealth (INDEPENDENT_AMBULATORY_CARE_PROVIDER_SITE_OTHER): Payer: Self-pay | Admitting: *Deleted

## 2010-03-29 ENCOUNTER — Ambulatory Visit: Payer: Self-pay | Admitting: Internal Medicine

## 2010-03-29 DIAGNOSIS — R05 Cough: Secondary | ICD-10-CM

## 2010-03-29 DIAGNOSIS — R059 Cough, unspecified: Secondary | ICD-10-CM | POA: Insufficient documentation

## 2010-03-30 ENCOUNTER — Telehealth (INDEPENDENT_AMBULATORY_CARE_PROVIDER_SITE_OTHER): Payer: Self-pay | Admitting: *Deleted

## 2010-04-05 ENCOUNTER — Telehealth (INDEPENDENT_AMBULATORY_CARE_PROVIDER_SITE_OTHER): Payer: Self-pay | Admitting: *Deleted

## 2010-04-19 ENCOUNTER — Ambulatory Visit: Payer: Self-pay | Admitting: Pulmonary Disease

## 2010-04-19 ENCOUNTER — Telehealth: Payer: Self-pay | Admitting: Internal Medicine

## 2010-05-07 ENCOUNTER — Emergency Department (HOSPITAL_COMMUNITY): Admission: EM | Admit: 2010-05-07 | Discharge: 2010-05-07 | Payer: Self-pay | Admitting: Emergency Medicine

## 2010-05-17 ENCOUNTER — Ambulatory Visit: Payer: Self-pay | Admitting: Internal Medicine

## 2010-06-21 ENCOUNTER — Emergency Department (HOSPITAL_COMMUNITY)
Admission: EM | Admit: 2010-06-21 | Discharge: 2010-06-21 | Payer: Self-pay | Source: Home / Self Care | Admitting: Family Medicine

## 2010-07-06 ENCOUNTER — Emergency Department (HOSPITAL_COMMUNITY): Admission: EM | Admit: 2010-07-06 | Discharge: 2009-09-07 | Payer: Self-pay | Admitting: Emergency Medicine

## 2010-08-31 NOTE — Miscellaneous (Signed)
Summary: Skin Test/[facility]  Skin Test/[facility]   Imported By: Sherian Rein 12/08/2009 07:21:54  _____________________________________________________________________  External Attachment:    Type:   Image     Comment:   External Document

## 2010-08-31 NOTE — Assessment & Plan Note (Signed)
Summary: Acute NP office visit - dyspnea   Copy to:  Kalman Shan, MD  Primary Jahkari Maclin/Referring Han Lysne:  [facility]   CC:  increased SOB, dry cough, and chest congestion with occ off-white colored mucus x29months - worse recently.  History of Present Illness: September 30, 2009 44 yoF Bipolar,followed at [facility] and referred now by Dr Marchelle Gearing for allergy evaluation. Hx of OSA and vocal cord dysfunction. Her complaint is of nose bleeds, sinus congestion, feeling run down and pains in chest, recurrent rashes with mouth blisters. Perennial postnasal drip worse in Spring and Fall. Triggers include strong odors and irritants. She suspects some undefined foods and is trying now to eat Vegan. Stays on benadryl to suppress rash and throat drainage. Latex contact dermatitis. Mother atopic- helped by allergy vaccine. Son with allergies to grass pollen and eggs. Mecholyl study negative. Celiac work up negative but choses to be on gluten free diet. Dr Vassie Loll manages for OSA on CPAP.  No hx ENT surgery. House, new to them x 2 years. Dogs. No basement, carpet or mold. CA plus airfilter.  Nov 30, 2009- Allergic rhintis, bronchitis.  Increased head and chest congestion off antihistamines this week. Skin test- limited positives for ragweed and some trees. As we finished today, she mentioned concern of possible stinging insect reaction in the past, without specific history.   March 13, 2010--Presents for increased SOB, dry cough, chest congestion with occ off-white colored mucus x80months - worse recently. Has frequent flare of dry cough and wheezing, Has [facility]. ER notes show neg xray last week. She has  lots of triggers lately to dust and smoke. I explained her work in past w/ neg MCT, nml spirometry, allergy eval. . She says these episodes are controlling her life. We talked about trigger prevention. Denies chest pain,  orthopnea,  hemoptysis, fever, n/v/d, edema, headache.   Medications Prior to Update: 1)  Benadryl 25 Mg Caps (Diphenhydramine Hcl) .Marland Kitchen.. 1-2 Tablets Every 4 Hours As Needed 2)  Clonazepam 0.5 Mg Tabs (Clonazepam) .Marland Kitchen.. 1 Tab By Mouth Twice Daily As Needed Anxiety 3)  Aspirin 325 Mg Tabs (Aspirin) .... 2 Tablets Every 4-6 Hours As Needed 4)  Ra Daytime Cold/flu 30-325-10 Mg/79ml Liqd (Pseudoephedrine-Apap-Dm) .... As Directed On Package 5)  Epipen 0.3 Mg/0.12ml Devi (Epinephrine) .... As Needed For Severe Allergic Reaction 6)  Nasal Moisturizer 0.65 % Soln (Saline) .... As Needed 7)  Levothyroxine Sodium 50 Mcg Tabs (Levothyroxine Sodium) .... Take 1 Tablet By Mouth Once A Day  Current Medications (verified): 1)  Benadryl 25 Mg Caps (Diphenhydramine Hcl) .Marland Kitchen.. 1-2 Tablets Every 4 Hours As Needed 2)  Clonazepam 0.5 Mg Tabs (Clonazepam) .Marland Kitchen.. 1 Tab By Mouth Twice Daily As Needed Anxiety 3)  Aspirin 325 Mg Tabs (Aspirin) .... 2 Tablets Every 4-6 Hours As Needed 4)  Ra Daytime Cold/flu 30-325-10 Mg/33ml Liqd (Pseudoephedrine-Apap-Dm) .... As Directed On Package 5)  Epipen 0.3 Mg/0.71ml Devi (Epinephrine) .... As Needed For Severe Allergic Reaction 6)  Nasal Moisturizer 0.65 % Soln (Saline) .... As Needed 7)  Levothyroxine Sodium 50 Mcg Tabs (Levothyroxine Sodium) .... Take 1 Tablet By Mouth Once A Day 8)  Loratadine 10 Mg Tabs (Loratadine) .... Take 1 Tablet By Mouth Two Times A Day 9)  Topamax 100 Mg Tabs (Topiramate) .... Take 1 Tablet By Mouth Two Times A Day 10)  Mucinex 600 Mg Xr12h-Tab (Guaifenesin) .Marland Kitchen.. 1 Tab Every 4 Hours As  Needed As Directed By Dr. Audria Nine 11)  Ventolin Hfa 108 (90 Base) Mcg/act Aers (Albuterol Sulfate) .... Inhale 2 Puffs Every Four Hours As Needed  Allergies: 1)  ! Wellbutrin 2)  ! Celexa  Past History:  Past Medical History: Last updated: 11/30/2009 #"ASTHMA" and DYSPNEA > acute visits 4/24 to ER, [facility], [facility],  12/10/2008 to er >Normal CXR  11/20/2008, 02/09/2008, 12/23/2007 -  >Spiro 12/08/2008: FeV1 2.68L/82%, FVC 3.3L.78%, Ratio 81 (82), Fef 25-75%: 3.4/96% >negative methacholine challenge test July 2010 >Normal CT chest 02/24/2009 (PE ruled out during ER visit and hospitalization) >CPST 02/28/2009: obesity as primary cause for dyspnea. NEgative test for exercise induced bronchospasm > [place] 03/23/2009 - VCD +  #ALLERGY SINUS TROUBLE- Skin test 11/30/09 #BIPOLAR/PTSD/OCD #MORBID OBESITY - BMI 49 > 360# in June 2010 -> 291# FEb 2011 #Moderate Sleep APnea Abnormal Sleep Study-[facility]-started on CPAP-12/2008.  #Diffuse Hepatic STeatosis >seen on Ct chest july 2010 #Thyroid Nodule on CT chest july 2010 > s/p bx 04/28/2009 - lymphocytes, histiocytes, and debris  Past Surgical History: Last updated: 08/05/2009 s/p tubal ligation 11/12/2002 C sections x 2  Family History: Last updated: 08/05/2009 Family History Breast Cancer--mother and mgm Family History MI/Heart Attack--pgf Family History Emphysema --pgm Family History Asthma--mother Family History Hypertension--father son - VCD Family History of Irritable Bowel Syndrome:Mother  Family History of Diabetes: Father  No FH of Colon Cancer: Family History of Colon Polyps:Mother   Social History: Last updated: 09/30/2009 Lives with husband, 5 boys, 1 girl, 4 dogs Unemployed Patient states former smoker. quit 2008 Alcohol Use - no Daily Caffeine Use: 2 daily  Illicit Drug Use - no  Risk Factors: Alcohol Use: 0 (12/13/2008) Caffeine Use: 1 (12/13/2008) Exercise: no (12/13/2008)  Risk Factors: Smoking Status: quit (01/26/2009)  Review of Systems      See HPI  Vital Signs:  Patient profile:   45 year old female Menstrual status:  regular Height:      72 inches Weight:      274.38 pounds BMI:     37.35 O2 Sat:      97 % on Room air Temp:     97.9 degrees F oral Pulse rate:   105 / minute BP sitting:   138 / 76  (right arm) Cuff size:   large  Vitals Entered By:  Boone Master CNA/MA (March 13, 2010 11:53 AM)  O2 Flow:  Room air CC: increased SOB, dry cough, chest congestion with occ off-white colored mucus x25months - worse recently Is Patient Diabetic? No Comments Medications reviewed with patient Daytime contact number verified with patient. Boone Master CNA/MA  March 13, 2010 11:54 AM    Physical Exam  Additional Exam:  General: A/Ox3; pleasant and cooperative, NAD, obese  SKIN: no rash, lesions NODES: no lymphadenopathy HEENT: [place]/AT, EOM- WNL, Conjuctivae- clear, PERRLA, TM-WNL, Nose- clear, Throat- clear and wnl, Mallampati  II,  NECK: Supple w/ fair ROM, JVD- none, normal carotid impulses w/o bruits Thyroid- , no stridor CHEST: Clear to P&A, no wheezing or psuedowheezing.  HEART: RRR, no m/g/r heard ABDOMEN: Soft and nl;  EAV:WUJW, nl pulses, no edema  NEURO: Grossly intact to observation      Impression & Recommendations:  Problem # 1:  DISEASE - VOCAL CORD NEC (ICD-478.5)  Extensive work up w/ no asthma found, however pt with multiple exacerbation w/ ER treatments.  Will try low dose ICS to see if this may have some preventive measures for her.  also add for  triggers of PND, and reflux.  close follow up with Dr. Marchelle Gearing  REC:  Begin Astepro 2 puffs at bedtime  Trial of QVAR 2 puffs two times a day -brush/rinse/gargle  Sugarless candy, ice chips, water to avoid throat clearing and coughing  Take claritin once daily  Use benadryl as needed drainage.  Begin Dexilant 60mg  once daily  follow up Dr. Marchelle Gearing in 2 weeks. and as needed  Please contact office for sooner follow up if symptoms do not improve or worsen  '  Orders: Est. Patient Level III (16109)  Medications Added to Medication List This Visit: 1)  Loratadine 10 Mg Tabs (Loratadine) .... Take 1 tablet by mouth two times a day 2)  Topamax 100 Mg Tabs (Topiramate) .... Take 1 tablet by mouth two times a day 3)  Mucinex 600 Mg Xr12h-tab (Guaifenesin)  .Marland Kitchen.. 1 tab every 4 hours as needed as directed by dr. Audria Nine 4)  Ventolin Hfa 108 (90 Base) Mcg/act Aers (Albuterol sulfate) .... Inhale 2 puffs every four hours as needed 5)  Qvar 40 Mcg/act Aers (Beclomethasone dipropionate) .... 2 puffs two times a day 6)  Astepro 0.15 % Soln (Azelastine hcl) .... 2 puffs at bedtime  Complete Medication List: 1)  Benadryl 25 Mg Caps (Diphenhydramine hcl) .Marland Kitchen.. 1-2 tablets every 4 hours as needed 2)  Clonazepam 0.5 Mg Tabs (Clonazepam) .Marland Kitchen.. 1 tab by mouth twice daily as needed anxiety 3)  Aspirin 325 Mg Tabs (Aspirin) .... 2 tablets every 4-6 hours as needed 4)  Ra Daytime Cold/flu 30-325-10 Mg/65ml Liqd (Pseudoephedrine-apap-dm) .... As directed on package 5)  Epipen 0.3 Mg/0.34ml Devi (Epinephrine) .... As needed for severe allergic reaction 6)  Nasal Moisturizer 0.65 % Soln (Saline) .... As needed 7)  Levothyroxine Sodium 50 Mcg Tabs (Levothyroxine sodium) .... Take 1 tablet by mouth once a day 8)  Loratadine 10 Mg Tabs (Loratadine) .... Take 1 tablet by mouth two times a day 9)  Topamax 100 Mg Tabs (Topiramate) .... Take 1 tablet by mouth two times a day 10)  Mucinex 600 Mg Xr12h-tab (Guaifenesin) .Marland Kitchen.. 1 tab every 4 hours as needed as directed by dr. Audria Nine 11)  Ventolin Hfa 108 (90 Base) Mcg/act Aers (Albuterol sulfate) .... Inhale 2 puffs every four hours as needed 12)  Qvar 40 Mcg/act Aers (Beclomethasone dipropionate) .... 2 puffs two times a day 13)  Astepro 0.15 % Soln (Azelastine hcl) .... 2 puffs at bedtime  Patient Instructions: 1)  Begin Astepro 2 puffs at bedtime  2)  Trial of QVAR 2 puffs two times a day -brush/rinse/gargle  3)  Sugarless candy, ice chips, water to avoid throat clearing and coughing  4)  Take claritin once daily  5)  Use benadryl as needed drainage.  6)  Begin Dexilant 60mg  once daily  7)  follow up Dr. Marchelle Gearing in 2 weeks. and as needed  8)  Please contact office for sooner follow up if symptoms do not  improve or worsen     Appended Document: Acute NP office visit - dyspnea Hi Tammy, I will see if the QVAR works. We had done a methacholine challenge and it was negative. This patient has been told it is her obesity. I will see how things go at [facility]. Will do spirometry at [facility]  Appended Document: Acute NP office visit - dyspnea pt has follow-up to see MR on 03-29-10

## 2010-08-31 NOTE — Progress Notes (Signed)
Summary: rx  Phone Note Call from Patient Call back at Home Phone [phone]   Caller: Patient Call For: ramaswamy Reason for Call: Talk to Nurse Summary of Call: plegm(yellow) coughing up lots of drainage, taking lots of OTC items, not helping.  Can she get something called in. [facility] [place] Initial call taken by: Eugene Gavia,  August 10, 2009 10:14 AM  Follow-up for Phone Call        Per Nicholos Johns, pt has appt for today at 2:30 with Dr. Delton Coombes. Follow-up by: Gweneth Dimitri RN,  August 10, 2009 11:39 AM

## 2010-08-31 NOTE — Assessment & Plan Note (Signed)
Summary: rov/ mbw   Visit Type:  Follow-up Copy to:  Kalman Shan, MD  Primary Provider/Referring Provider:  Dala Dock   CC:  Pt here for follow-up. Pt states she felt better the first few days and but when the air quality goes bad so does her breathing.  Marland Kitchen  History of Present Illness: OV 09/05/2009. Followup VCD, Recurrent Acute Sinusitis in a morbidly obese female. Stacey [name] returns for followup. Since her last visit she has had multiple episodes of recurrent sinusitis. She has a print out of the govt, report of air quality for past 1 month wiht her. STates that her episodes correspond to when the map is "yellow" suggesting greater than moderate levels of poor air quality. During this time she gets rhinitis and chest tightness. She denies prior allergy or ENT workup.  Currently feeling better. In terms of obesity, she continues to lose weight through dietary measures   Current Medications (verified): 1)  Benadryl 25 Mg Caps (Diphenhydramine Hcl) .Marland Kitchen.. 1-2 Tablets Every 4 Hours As Needed 2)  Clonazepam 0.5 Mg Tabs (Clonazepam) .Marland Kitchen.. 1 Tab By Mouth Twice Daily As Needed Anxiety 3)  Veramyst 27.5 Mcg/spray Susp (Fluticasone Furoate) .... Two Sprays Once Daily For One Week, Then As Needed 4)  Aspirin 325 Mg Tabs (Aspirin) .... 2 Tablets Every 4-6 Hours As Needed 5)  Ra Daytime Cold/flu 30-325-10 Mg/22ml Liqd (Pseudoephedrine-Apap-Dm) .... As Directed On Package 6)  Epipen 0.3 Mg/0.74ml Devi (Epinephrine) .... As Needed For Severe Allergic Reaction 7)  Nasal Moisturizer 0.65 % Soln (Saline) .... As Needed 8)  Prednisone 10 Mg Tabs (Prednisone) .... 4 Tabs For 2 Days, Then 3 Tabs For 2 Days, 2 Tabs For 2 Days, Then 1 Tab For 2 Days, Then Stop 9)  Doxycycline Hyclate 100 Mg Caps (Doxycycline Hyclate) .... Take One Cap By Mouth Two Times A Day After Meals X6 Days 10)  Levothyroxine Sodium 50 Mcg Tabs (Levothyroxine Sodium) .... Take 1 Tablet By Mouth Once A Day 11)  Astepro 0.15 % Soln (Azelastine  Hcl) .... 2 Sprays Each Evening  Allergies (verified): 1)  ! Wellbutrin 2)  ! Celexa  Past History:  Past Surgical History: Last updated: 08/05/2009 s/p tubal ligation 11/12/2002 C sections x 2  Family History: Last updated: 08/05/2009 Family History Breast Cancer--mother and mgm Family History MI/Heart Attack--pgf Family History Emphysema --pgm Family History Asthma--mother Family History Hypertension--father son - VCD Family History of Irritable Bowel Syndrome:Mother  Family History of Diabetes: Father  No FH of Colon Cancer: Family History of Colon Polyps:Mother   Social History: Last updated: 08/05/2009 Lives with husband, 5 boys, 1 girl, 4 dogs Unemployed Patient states former smoker.  Alcohol Use - no Daily Caffeine Use: 2 daily  Illicit Drug Use - no  Risk Factors: Alcohol Use: 0 (12/13/2008) Caffeine Use: 1 (12/13/2008) Exercise: no (12/13/2008)  Risk Factors: Smoking Status: quit (01/26/2009)  Past Medical History: #"ASTHMA" and DYSPNEA > acute visits 4/24 to ER, [facility], [facility],  12/10/2008 to er >Normal CXR 11/20/2008, 02/09/2008, 12/23/2007 -  >Spiro 12/08/2008: FeV1 2.68L/82%, FVC 3.3L.78%, Ratio 81 (82), Fef 25-75%: 3.4/96% >negative methacholine challenge test July 2010 >Normal CT chest 02/24/2009 (PE ruled out during ER visit and hospitalization) >CPST 02/28/2009: obesity as primary cause for dyspnea. NEgative test for exercise induced bronchospasm > [place] 03/23/2009 - VCD +  #ALLERGY SINUS TROUBLE #BIPOLAR/PTSD/OCD #MORBID OBESITY - BMI 49 > 360# in June 2010 -> 291# FEb 2011 #Moderate Sleep APnea Abnormal Sleep Study-[facility]-started on CPAP-12/2008.  #  Diffuse Hepatic STeatosis >seen on Ct chest july 2010 #Thyroid Nodule on CT chest july 2010 > s/p bx 04/28/2009 - lymphocytes, histiocytes, and debris  Family History: Reviewed history from 08/05/2009 and no changes required. Family History Breast Cancer--mother and  mgm Family History MI/Heart Attack--pgf Family History Emphysema --pgm Family History Asthma--mother Family History Hypertension--father son - VCD Family History of Irritable Bowel Syndrome:Mother  Family History of Diabetes: Father  No FH of Colon Cancer: Family History of Colon Polyps:Mother   Social History: Reviewed history from 08/05/2009 and no changes required. Lives with husband, 5 boys, 1 girl, 4 dogs Unemployed Patient states former smoker.  Alcohol Use - no Daily Caffeine Use: 2 daily  Illicit Drug Use - no  Review of Systems       The patient complains of productive cough and nasal congestion/difficulty breathing through nose.  The patient denies shortness of breath with activity, shortness of breath at rest, non-productive cough, coughing up blood, chest pain, irregular heartbeats, acid heartburn, indigestion, loss of appetite, weight change, abdominal pain, difficulty swallowing, sore throat, tooth/dental problems, headaches, sneezing, itching, ear ache, anxiety, depression, hand/feet swelling, joint stiffness or pain, rash, change in color of mucus, and fever.    Vital Signs:  Patient profile:   45 year old female Menstrual status:  regular Height:      72 inches Weight:      291.38 pounds O2 Sat:      98 % on Room air Temp:     97.8 degrees F oral Pulse rate:   84 / minute BP sitting:   122 / 80  (left arm) Cuff size:   large  Vitals Entered By: Carron Curie CMA (September 05, 2009 11:56 AM)  O2 Flow:  Room air CC: Pt here for follow-up. Pt states she felt better the first few days, but when the air quality goes bad so does her breathing.   Comments Medications reviewed with patient Carron Curie CMA  September 05, 2009 11:57 AM Daytime phone number verified with patient.    Physical Exam  General:  obese but losing weight Head:  normocephalic and atraumatic Eyes:  PERRLA and EOMI.   Ears:  mild nasal congestion Nose:  clear nasal discharge,  mild frontal sinus tenderness Mouth:  no deformity or lesions Neck:  no JVD.   Chest Wall:  no deformities noted Lungs:  clear bilaterally to auscultation and percussion Heart:  regular rate and rhythm, S1, S2 without murmurs, rubs, gallops, or clicks Abdomen:  not examined Msk:  no deformity or scoliosis noted with normal posture Pulses:  pulses normal Extremities:  both feet in long, tight stockings. RLE was area of cellulitis. She reports this has healed.  Reluctant to remove the stockings Neurologic:  non-focal Skin:  intact without lesions or rashes Cervical Nodes:  no significant adenopathy Axillary Nodes:  no significant adenopathy Psych:  anxious and easily distracted.     Impression & Recommendations:  Problem # 1:  ALLERGIC RHINITIS CAUSE UNSPECIFIED (ICD-477.9) Assessment Unchanged She appears to having recurrent rhinitis/sinusitis. Wonder if there is allergic component. I have reiterated to her multiple times that she does not have asthma based on our testing but is possible that she has an allergic issue especially if symptoms correlate with air-quality. Will refer her to DR Maple Hudson for allergy workup   Her updated medication list for this problem includes:    Benadryl 25 Mg Caps (Diphenhydramine hcl) .Marland Kitchen... 1-2 tablets every 4 hours as needed  Veramyst 27.5 Mcg/spray Susp (Fluticasone furoate) .Marland Kitchen..Marland Kitchen Two sprays once daily for one week, then as needed    Nasal Moisturizer 0.65 % Soln (Saline) .Marland Kitchen... As needed    Astepro 0.15 % Soln (Azelastine hcl) .Marland Kitchen... 2 sprays each evening  Orders: Allergy Referral  (Allergy) Est. Patient Level II (47829)  Medications Added to Medication List This Visit: 1)  Astepro 0.15 % Soln (Azelastine hcl) .... 2 sprays each evening  Patient Instructions: 1)  Please visit with Dr. Maple Hudson in our office for evaluation of allergic rhinitis 2)  Use saline netti pot nasal wash daily. Available at [facility], [facility], etc 3)  For acute sinus issues call  your primary care 4)  Followup based on visit with DR Maple Hudson

## 2010-08-31 NOTE — Progress Notes (Signed)
Summary: talk to nurse  Phone Note Call from Patient Call back at Home Phone [phone]   Caller: Patient Call For: Jalaya Sarver Reason for Call: Talk to Nurse Summary of Call: Pt states she was in for allergy testing on 5/4 and that a couple of places on her arm that were tested are itching now, pls advise. Initial call taken by: Darletta Moll,  Dec 02, 2009 8:22 AM  Follow-up for Phone Call        pt had skin test on 11/30/09 and states there are a few places on her arms now that are still red and itching and she wants to know what to use for this. Please advise.Carron Curie CMA  Dec 02, 2009 9:48 AM allergies: wellbutrin, celexa  per CY-use caladryl lotion. Carron Curie CMA  Dec 02, 2009 10:02 AM  pt advised. Carron Curie CMA  Dec 02, 2009 10:04 AM

## 2010-08-31 NOTE — Assessment & Plan Note (Signed)
Summary: rov 2 wks w/ mr ///kp   Visit Type:  Follow-up Copy to:  Kalman Shan, MD  Primary Corah Willeford/Referring Kenyatte Chatmon:  Dala Dock   CC:  Pt here for follow-up. Pt only used qvar 2 days. Marland Kitchen  History of Present Illness:  Followup VCD, REcurrent acute sinusitis  OV 03/29/2010: Last seen Feb 2011. She then saw Dr. Maple Hudson allergist for allergy eval of sinuses. Workup was only mildly positive. No allergy shots recommended. Only envirmental control recommended. She continue to suffer from sinus flares with dysponea, dry cough, "throat closing up' and 'wheeze' esp with trriggers. Her triggers are consistent prior triggers and inlcude bleach, smoke, dogs, carpets. She feels symptoms perists despite adopting breathing exercies that she learned once before at speech therapy for her VCD. Marland Kitchen She says these episodes are controlling her life. Denies chest pain,  orthopnea, hemoptysis, fever, n/v/d, edema, headache. On a positive note, she continues to lose weight.  Preventive Screening-Counseling & Management  Alcohol-Tobacco     Smoking Status: quit     Year Quit: 2008  Current Medications (verified): 1)  Benadryl 25 Mg Caps (Diphenhydramine Hcl) .Marland Kitchen.. 1-2 Tablets Every 4 Hours As Needed 2)  Aspirin 325 Mg Tabs (Aspirin) .... 2 Tablets Every 4-6 Hours As Needed 3)  Ra Daytime Cold/flu 30-325-10 Mg/64ml Liqd (Pseudoephedrine-Apap-Dm) .... As Directed On Package 4)  Epipen 0.3 Mg/0.28ml Devi (Epinephrine) .... As Needed For Severe Allergic Reaction 5)  Nasal Moisturizer 0.65 % Soln (Saline) .... As Needed 6)  Levothyroxine Sodium 50 Mcg Tabs (Levothyroxine Sodium) .... Take 1 Tablet By Mouth Once A Day 7)  Loratadine 10 Mg Tabs (Loratadine) .... Take 1 Tablet By Mouth Two Times A Day 8)  Topamax 100 Mg Tabs (Topiramate) .... Take 1 Tablet By Mouth Two Times A Day 9)  Mucinex 600 Mg Xr12h-Tab (Guaifenesin) .Marland Kitchen.. 1 Tab Every 4 Hours As Needed As Directed By Dr. Audria Nine 10)  Ventolin Hfa 108 (90 Base)  Mcg/act Aers (Albuterol Sulfate) .... Inhale 2 Puffs Every Four Hours As Needed 11)  Astepro 0.15 % Soln (Azelastine Hcl) .... 2 Puffs At Bedtime  Allergies (verified): 1)  ! Wellbutrin 2)  ! Celexa  Past History:  Past medical, surgical, family and social histories (including risk factors) reviewed, and no changes noted (except as noted below).  Past Medical History: #"ASTHMA" and DYSPNEA...Marland KitchenDr Marchelle Gearing > acute visits 4/24 to ER, [facility], [facility],  12/10/2008 to er >Normal CXR 11/20/2008, 02/09/2008, 12/23/2007 -  >Spiro 12/08/2008: FeV1 2.68L/82%, FVC 3.3L.78%, Ratio 81 (82), Fef 25-75%: 3.4/96% >negative methacholine challenge test July 2010 >Normal CT chest 02/24/2009 (PE ruled out during ER visit and hospitalization) >CPST 02/28/2009: obesity as primary cause for dyspnea. NEgative test for exercise induced bronchospasm > Lary 03/23/2009 - VCD +  #ALLERGY SINUS TROUBLE.... Dr Fannie Knee  > Skin test 11/30/09 Skin test- limited positives for ragweed and some trees. #BIPOLAR/PTSD/OCD #MORBID OBESITYwith intentionalweight loss -  > BMI 49 > BMI 36.8 Augutst 2011 > 360# in June 2010 -> 291# FEb 2011 #Moderate Sleep APnea.Marland KitchenMarland KitchenDr Vassie Loll > y-OSA-started on CPAP-12/2008.  #Diffuse Hepatic STeatosis >seen on Ct chest july 2010 #Thyroid Nodule on CT chest july 2010 > s/p bx 04/28/2009 - lymphocytes, histiocytes, and debris  Past Surgical History: Reviewed history from 08/05/2009 and no changes required. s/p tubal ligation 11/12/2002 C sections x 2  Family History: Reviewed history from 08/05/2009 and no changes required. Family History Breast Cancer--mother and mgm Family History MI/Heart Attack--pgf Family History Emphysema --pgm  Family History Asthma--mother Family History Hypertension--father son - VCD Family History of Irritable Bowel Syndrome:Mother  Family History of Diabetes: Father  No FH of Colon Cancer: Family History of Colon Polyps:Mother    Social History: Reviewed history from 09/30/2009 and no changes required. Lives with husband, 5 boys, 1 girl, 4 dogs Unemployed Patient states former smoker. quit 2008 Alcohol Use - no Daily Caffeine Use: 2 daily  Illicit Drug Use - no  Review of Systems       The patient complains of sore throat and nasal congestion/difficulty breathing through nose.  The patient denies shortness of breath with activity, shortness of breath at rest, productive cough, non-productive cough, coughing up blood, chest pain, irregular heartbeats, acid heartburn, indigestion, loss of appetite, weight change, abdominal pain, difficulty swallowing, tooth/dental problems, headaches, sneezing, itching, ear ache, anxiety, depression, hand/feet swelling, joint stiffness or pain, rash, change in color of mucus, and fever.    Vital Signs:  Patient profile:   45 year old female Menstrual status:  regular Height:      72 inches Weight:      270.25 pounds BMI:     36.78 O2 Sat:      98 % on Room air Temp:     98.5 degrees F oral Pulse rate:   88 / minute BP sitting:   110 / 80  (right arm) Cuff size:   regular  Vitals Entered By: Carron Curie CMA (March 29, 2010 2:23 PM)  O2 Flow:  Room air CC: Pt here for follow-up. Pt only used qvar 2 days.    Physical Exam  General:  obese but losing weight Head:  normocephalic and atraumatic Eyes:  PERRLA and EOMI.   Ears:  mild nasal congestion Nose:  clear nasal discharge, mild frontal sinus tenderness Mouth:  no deformity or lesions Neck:  no JVD.   Chest Wall:  no deformities noted Lungs:  clear bilaterally to auscultation and percussion Heart:  regular rate and rhythm, S1, S2 without murmurs, rubs, gallops, or clicks Abdomen:  obese soft non tender no rebound Msk:  no deformity or scoliosis noted with normal posture Pulses:  pulses normal Extremities:  both feet in long, tight stockings. RLE was area of cellulitis. She reports this has healed.   Reluctant to remove the stockings Neurologic:  non-focal Skin:  intact without lesions or rashes Cervical Nodes:  no significant adenopathy Axillary Nodes:  no significant adenopathy Psych:  depressed affect and anxious.     Impression & Recommendations:  Problem # 1:  COUGH (ICD-786.2) Assessment Deteriorated  This is due to VCD and post nasal drainage related LPR (layrngopharyngeal reflux). Last visit my NP started qVAr but she did not take it. I do not think this will help because she does not have asthma (ruled out by [facility]). I wil start her on neurontin. We discussed side effects and she recollects taking it many years ago for bipolar. She is fine with the strategy and iwll monitor for ADRs and call us. I will get her to see Dr. Suszanne Conners ENT to see if we can improve sinus control any further. WE discussed going to back to speech therapy again but she was not inclined.   Orders: Est. Patient Level III (40981)  Medications Added to Medication List This Visit: 1)  Neurontin 300 Mg Caps (Gabapentin) .... Take  1 capsusle daily for 3 days then 1 cap two times a day x 3 days then 1 cap three times a day to continue  Other Orders: ENT Referral (ENT)  Patient Instructions: 1)  I will refer you to Dr. Suszanne Conners for ENT eval 2)  STart neurontin as directed 3)  If sleepy or having side effects from it call me 4)  return to see me in 6 weeks to report progress Prescriptions: NEURONTIN 300 MG CAPS (GABAPENTIN) take  1 capsusle daily for 3 days then 1 cap two times a day x 3 days then 1 cap three times a day to continue  #90 x 1   Entered and Authorized by:   Kalman Shan MD   Signed by:   Kalman Shan MD on 03/29/2010   Method used:   Electronically to        [facility] (retail)       [address]       [place], [place]  [address]       Ph: [phone]       Fax: [phone]   RxID:   [id number]

## 2010-08-31 NOTE — Progress Notes (Signed)
Summary: sub for dexilant and astepro  Phone Note Call from Patient   Caller: Patient Call For: Stacey [name] Summary of Call: need neurontin prescript called to [facility]  Initial call taken by: Rickard Patience,  March 30, 2010 10:40 AM  Follow-up for Phone Call        SPoke with pt and she states neurotin at [facility] was $50 and she cannot afford this so she wants it sent to [facility] pharmacy. Rx for neurotin has been sent.  She also request that I ask [facility] if they have Astepro and dexilant, and if so then she needs an rx. If they do not have these meds then pt is requesting a generic substitute be prescribed and sent to [facility] pharmacy. Please advise on a sub. Thanks.Carron Curie CMA  March 30, 2010 12:16 PM   Additional Follow-up for Phone Call Additional follow up Details #1::        #Neurontin - thanks for sending to [facility] #Astepro- no generic. If she wants she can have generic fluticasone which is a steroid and different action to astepro #Dexilant -> generic omeprazole 20-40mg  per day which alone should cost $25-50/month. So, she can have generic ranitidine (different mchansism and weaker but still ok) 150 (1tab) -300mg  (2tab) at bedtime. this should cost $5 per month or so  Additional Follow-up by: Kalman Shan MD,  March 30, 2010 3:18 PM    Additional Follow-up for Phone Call Additional follow up Details #2::    Spoke with pt and notified of the above recs per MR.  Pt verbalized understanding.  She is willing to try the fluticasone and the omeprazole- I will be happy to send these meds to [facility], just need to know the directions for fluticasone and also if you want her on 20 or 40 mg of omeprazole and how many times per day you want her to take this.  Pls advise thanks! Follow-up by: Vernie Murders,  March 30, 2010 3:28 PM  Additional Follow-up for Phone Call Additional follow up Details #3:: Details for Additional Follow-up  Action Taken: i jsut sent it. Plse ensure it went to [facility]  Rxs went successfully- pt aware Vernie Murders  March 30, 2010 3:40 PM  Additional Follow-up by: Kalman Shan MD,  March 30, 2010 3:32 PM  New/Updated Medications: FLUTICASONE PROPIONATE 50 MCG/ACT SUSP (FLUTICASONE PROPIONATE) 2 squirts each nostril daily OMEPRAZOLE 20 MG CPDR (OMEPRAZOLE) 1 capsule on empty stomach in morning Prescriptions: OMEPRAZOLE 20 MG CPDR (OMEPRAZOLE) 1 capsule on empty stomach in morning  #30 x 6   Entered and Authorized by:   Kalman Shan MD   Signed by:   Kalman Shan MD on 03/30/2010   Method used:   Faxed to ...       [facility] (retail)       [address] [place], [place]  [address]       Ph: [phone]       Fax: [phone]   RxID:   [id number] FLUTICASONE PROPIONATE 50 MCG/ACT SUSP (FLUTICASONE PROPIONATE) 2 squirts each nostril daily  #1 x 6   Entered and Authorized by:   Kalman Shan MD   Signed by:   Kalman Shan MD on 03/30/2010   Method used:   Faxed to ...       [facility] (retail)       [address]  [place], [place]  [address]       Ph: [phone]       Fax: [phone]   RxID:   [id number] NEURONTIN 300 MG CAPS (GABAPENTIN) take  1 capsusle daily for 3 days then 1 cap two times a day x 3 days then 1 cap three times a day to continue  #90 x 1   Entered by:   Carron Curie CMA   Authorized by:   Kalman Shan MD   Signed by:   Carron Curie CMA on 03/30/2010   Method used:   Faxed to ...       [facility] (retail)       [address] [place], [place]  [address]       Ph: [phone]       Fax: [phone]   RxID:   [id number]

## 2010-08-31 NOTE — Consult Note (Signed)
Summary: [facility]   Imported By: Lester Bessemer 09/22/2009 07:32:44  _____________________________________________________________________  External Attachment:    Type:   Image     Comment:   External Document

## 2010-08-31 NOTE — Letter (Signed)
Summary: Generic Letter  [facility]  [address] [place], [place] [address]   Phone: [phone]  Fax: [phone]    08/05/2009  [facility] [address] [place], [place]  [address]  Dear Stacey [name],  It is my pleasure to have treated you recently as a new patient in my office. I appreciate your confidence and the opportunity to participate in your care.  Since I do have a busy inpatient endoscopy schedule and office schedule, my office hours vary weekly. I am, however, available for emergency calls everyday through my office. If I am not available for an urgent office appointment, another one of our gastroenterologist will be able to assist you.  My well-trained staff are prepared to help you at all times. For emergencies after office hours, a physician from our Gastroenterology section is always available through my 24 hour answering service  Once again I welcome you as a new patient and I look forward to a happy and healthy relationship               Sincerely,   Melvia Heaps MD  Appended Document: Generic Letter LETTER MAILED

## 2010-08-31 NOTE — Progress Notes (Signed)
Summary: samples of Dexilant   Phone Note Call from Patient Call back at Home Phone [phone]   Caller: Patient Call For: ramaswamy / Tammy P Reason for Call: Talk to Nurse Summary of Call: Dexilant 60mg  samples were given to pt at last visit, due in next week with MR.  Can she get enough samples to last her until appt or is there something OTC she can take? Initial call taken by: Eugene Gavia,  March 23, 2010 10:29 AM  Follow-up for Phone Call        called and spoke with pt.  pt recently saw TP on 03-13-2010 and was started on Dexilant.  Pt states she is about to run out of samples.  Gave pt another week worth of samples to last until she is seen by MR next week on 03-29-2010.  pt aware samples at front  desk.  Aundra Millet Reynolds LPN  March 23, 2010 10:41 AM

## 2010-08-31 NOTE — Assessment & Plan Note (Signed)
Summary: EXPLOSIVE BMS & ABD PAIN/YF    History of Present Illness Visit Type: consult  Primary GI MD: Melvia Heaps MD [facility] Primary Provider: Dala Dock  Requesting Provider: Kalman Shan, MD  Chief Complaint: Generalized abd pain, diarrhea and constipation  History of Present Illness:   [name] is a 45 year old white female referred at the request of Dr. Audria Nine for evaluation of diarrhea.  On a regular diet she frequently will experience diffuse abdominal pain followed by explosive diarrhea.  This may waken her.  Sac clearly improved on a gluten-free diet.  She has had some small amounts of bleeding at future based hemorrhoids.  The make her gassy.  CT of the abdomen in October, 2010 that showed fatty liver and gallstones.   GI Review of Systems    Reports abdominal pain.     Location of  Abdominal pain: generalized.    Denies acid reflux, belching, bloating, chest pain, dysphagia with liquids, dysphagia with solids, heartburn, loss of appetite, nausea, vomiting, vomiting blood, weight loss, and  weight gain.      Reports constipation and  diarrhea.     Denies anal fissure, black tarry stools, change in bowel habit, diverticulosis, fecal incontinence, heme positive stool, hemorrhoids, irritable bowel syndrome, jaundice, light color stool, liver problems, rectal bleeding, and  rectal pain.    Current Medications (verified): 1)  Benadryl 25 Mg Caps (Diphenhydramine Hcl) .Marland Kitchen.. 1-2 Tablets Every 4 Hours As Needed 2)  Clonazepam 0.5 Mg Tabs (Clonazepam) .... As Needed Anxiety 3)  Veramyst 27.5 Mcg/spray Susp (Fluticasone Furoate) .... Two Sprays Once Daily For One Week, Then As Needed 4)  Aspirin 325 Mg Tabs (Aspirin) .... 2 Tablets Every 4-6 Hours Prn 5)  Ra Daytime Cold/flu 30-325-10 Mg/20ml Liqd (Pseudoephedrine-Apap-Dm) .... As Directed 6)  Epipen 0.3 Mg/0.42ml Devi (Epinephrine) .... As Needed  Allergies (verified): 1)  ! Wellbutrin 2)  ! Celexa  Past History:  Past  Medical History: Reviewed history from 07/06/2009 and no changes required. #"ASTHMA" and DYSPNEA > acute visits 4/24 to ER, 4/[facility], [facility]/201 [facility],  12/10/2008 to er >Normal CXR 11/20/2008, 02/09/2008, 12/23/2007 -  >Spiro 12/08/2008: FeV1 2.68L/82%, FVC 3.3L.78%, Ratio 81 (82), Fef 25-75%: 3.4/96% >negative methacholine challenge test July 2010 >Normal CT chest 02/24/2009 (PE ruled out during ER visit and hospitalization) >CPST 02/28/2009: obesity as primary cause for dyspnea. NEgative test for exercise induced bronchospasm > Lary 03/23/2009 - VCD +  #ALLERGY SINUS TROUBLE #BIPOLAR/PTSD/OCD #MORBID OBESITY - BMI 49 > 360# in June 2010 -> 300# Dec 2010 #Moderate Sleep APnea Abnormal Sleep Study-OSA-started on CPAP-12/2008.  #Diffuse Hepatic STeatosis >seen on Ct chest july 2010 #Thyroid Nodule on CT chest july 2010 > s/p bx 04/28/2009 - lymphocytes, histiocytes, and debris  Past Surgical History: s/p tubal ligation 11/12/2002 C sections x 2  Family History: Family History Breast Cancer--mother and mgm Family History MI/Heart Attack--pgf Family History Emphysema --pgm Family History Asthma--mother Family History Hypertension--father son - VCD Family History of Irritable Bowel Syndrome:Mother  Family History of Diabetes: Father  No FH of Colon Cancer: Family History of Colon Polyps:Mother   Social History: Lives with husband, 5 boys, 1 girl, 4 dogs Unemployed Patient states former smoker.  Alcohol Use - no Daily Caffeine Use: 2 daily  Illicit Drug Use - no Drug Use:  no  Review of Systems       The patient complains of allergy/sinus, anxiety-new, arthritis/joint pain, back pain, confusion, cough, depression-new, fatigue, headaches-new, itching, menstrual pain, muscle pains/cramps, shortness  of breath, skin rash, sleeping problems, sore throat, swelling of feet/legs, and urination - excessive.  The patient denies anemia, blood in urine, breast changes/lumps,  change in vision, coughing up blood, fainting, fever, hearing problems, heart murmur, heart rhythm changes, night sweats, nosebleeds, pregnancy symptoms, swollen lymph glands, thirst - excessive , urination - excessive , urination changes/pain, urine leakage, vision changes, and voice change.         All other systems were reviewed and were negative   Vital Signs:  Patient profile:   45 year old female Menstrual status:  regular Height:      72 inches Weight:      295 pounds BMI:     40.15 BSA:     2.52 Pulse rate:   88 / minute Pulse rhythm:   regular BP sitting:   124 / 82  (left arm) Cuff size:   regular  Vitals Entered By: Ok Anis CMA (August 05, 2009 9:06 AM)  Physical Exam  Additional Exam:  On physical exam she is an obese female  skin: anicteric HEENT: normocephalic; PEERLA; no nasal or pharyngeal abnormalities neck: supple nodes: no cervical lymphadenopathy chest: clear to ausculatation and percussion heart: no murmurs, gallops, or rubs abd: soft, nontender; BS normoactive; no abdominal masses, tenderness, organomegaly rectal: deferred ext: no cynanosis, clubbing, edema skeletal: no deformities neuro: oriented x 3; no focal abnormalities    Impression & Recommendations:  Problem # 1:  DIARRHEA (ICD-787.91) Symptoms raise the question of celiac disease if, in fact, she is markedly improved on a gluten-free diet.  IBS, microscopic or macroscopic colitis are  other possibilities.  Recommendations #1 check celiac panel #2 colonoscopy if #1 is negative.  If serology suggest celiac disease then I will proceed with a small bowel biopsy Orders: TLB-CBC Platelet - w/Differential (85025-CBCD) TLB-CMP (Comprehensive Metabolic Pnl) (80053-COMP) TLB-PT (Protime) (85610-PTP) TLB-PTT (85730-PTTL) T-Sprue Panel (Celiac Disease Aby Eval) (83516x3/[address])  Problem # 2:  ALCOHOLISM (ICD-303.90) Assessment: Comment Only  Problem # 3:  OBSTRUCTIVE SLEEP APNEA  (ICD-780.57) Assessment: Comment Only  Problem # 4:  ASTHMA (ICD-493.90) Assessment: Comment Only  Problem # 5:  BIPOLAR DISORDER UNSPECIFIED (ICD-296.80) Assessment: Comment Only  Patient Instructions: 1)  cc Dow Adolph, MD

## 2010-08-31 NOTE — Assessment & Plan Note (Signed)
Summary: flu vaccine- ok per ts//kp   Nurse Visit   Allergies: 1)  ! Wellbutrin 2)  ! Celexa  Orders Added: 1)  Admin 1st Vaccine [90471] 2)  Flu Vaccine 110yrs + [[address]]  Flu Vaccine Consent Questions     Do you have a history of severe allergic reactions to this vaccine? no    Any prior history of allergic reactions to egg and/or gelatin? no    Do you have a sensitivity to the preservative Thimersol? no    Do you have a past history of Guillan-Barre Syndrome? no    Do you currently have an acute febrile illness? no    Have you ever had a severe reaction to latex? no    Vaccine information given and explained to patient? yes    Are you currently pregnant? no    Lot Number:[id number]   Exp Date:01/26/2010   Site Given  Right Deltoid IM Tammy Scott  August 01, 2009 4:28 PM

## 2010-08-31 NOTE — Progress Notes (Signed)
Summary: two GI appts/ two GI docs?  Phone Note From Other Clinic   Caller: robin- w/ [facility]- dr Arlyce Dice Call For: [facility] Summary of Call: pt has an appt scheduled w/ dr Arlyce Dice for 1/7. however, pt is already scheduled for endo w/ dr Talmage Nap 08/22/09. per caller: should the appt w/ dr Arlyce Dice be cancelled? call robin at [phone] Initial call taken by: Tivis Ringer,  August 01, 2009 11:11 AM  Follow-up for Phone Call        lm for.pt to call me back/pt's appt to see dr Talmage Nap is for endocrinology and she does plan to see dr Arlyce Dice on 08/05/09 Follow-up by: Oneita Jolly,  August 01, 2009 1:48 PM

## 2010-08-31 NOTE — Assessment & Plan Note (Signed)
Summary: ALLERGIES///kp   Vital Signs:  Patient profile:   45 year old female Menstrual status:  regular Height:      72 inches Weight:      284.25 pounds BMI:     38.69 O2 Sat:      96 % on Room air Pulse rate:   104 / minute BP sitting:   106 / 66  (left arm) Cuff size:   large  Vitals Entered By: Reynaldo Minium CMA (September 30, 2009 3:29 PM)  O2 Flow:  Room air  Copy to:  Kalman Shan, MD  Primary Provider/Referring Provider:  Dala Dock   CC:  Allerguy Consult-Dr. Marchelle Gearing.Marland Kitchen  History of Present Illness: September 30, 2009 44 yoF Bipolar,followed at [facility] and referred now by Dr Marchelle Gearing for allergy evaluation. Hx of OSA and vocal cord dysfunction. Her complaint is of nose bleeds, sinus congestion, feeling run down and pains in chest, recurrent rashes with mouth blisters. Perennial postnasal drip worse in Spring and Fall. Triggers include strong odors and irritants. She suspects some undefined foods and is trying now to eat Vegan. Stays on benadryl to suppress rash and throat drainage. Latex contact dermatitis. Mother atopic- helped by allergy vaccine. Son with allergies to grass polen and eggs. Mecholyl study negative. Celiac work up negative but choses to be on gluten free diet. Dr Vassie Loll manages for OSA on CPAP.  No hx ENT surgery. House, new to them x 2 years. Dogs. No basement, carpet or mold. CA plus airfilter.   Current Medications (verified): 1)  Benadryl 25 Mg Caps (Diphenhydramine Hcl) .Marland Kitchen.. 1-2 Tablets Every 4 Hours As Needed 2)  Clonazepam 0.5 Mg Tabs (Clonazepam) .Marland Kitchen.. 1 Tab By Mouth Twice Daily As Needed Anxiety 3)  Aspirin 325 Mg Tabs (Aspirin) .... 2 Tablets Every 4-6 Hours As Needed 4)  Ra Daytime Cold/flu 30-325-10 Mg/60ml Liqd (Pseudoephedrine-Apap-Dm) .... As Directed On Package 5)  Epipen 0.3 Mg/0.34ml Devi (Epinephrine) .... As Needed For Severe Allergic Reaction 6)  Nasal Moisturizer 0.65 % Soln (Saline) .... As Needed 7)  Levothyroxine Sodium 50  Mcg Tabs (Levothyroxine Sodium) .... Take 1 Tablet By Mouth Once A Day 8)  Topiramate 25 Mg Tabs (Topiramate) .... Take 1 By Mouth Two Times A Day X 1 Week, Take 2 By Mouth Two Times A Day X 1 Week, Take 3 By Mouth Two Times A Day X 1 Week.  Allergies (verified): 1)  ! Wellbutrin 2)  ! Celexa  Social History: Lives with husband, 5 boys, 1 girl, 4 dogs Unemployed Patient states former smoker. quit 2008 Alcohol Use - no Daily Caffeine Use: 2 daily  Illicit Drug Use - no  Review of Systems       The patient complains of shortness of breath with activity, shortness of breath at rest, productive cough, non-productive cough, chest pain, irregular heartbeats, indigestion, loss of appetite, weight change, abdominal pain, sore throat, headaches, nasal congestion/difficulty breathing through nose, sneezing, itching, anxiety, depression, joint stiffness or pain, rash, and change in color of mucus.  The patient denies coughing up blood, acid heartburn, difficulty swallowing, tooth/dental problems, ear ache, hand/feet swelling, and fever.         Aware heartburn while pregnant.  Physical Exam  Additional Exam:  General: A/Ox3; pleasant and cooperative, NAD, overweight, talkative SKIN: no rash, lesions NODES: no lymphadenopathy HEENT: [place]/AT, EOM- WNL, Conjuctivae- clear, PERRLA, TM-WNL, Nose- clear, Throat- clear and wnl, Mallampati  II, ot red NECK: Supple w/ fair ROM, JVD- none, normal  carotid impulses w/o bruits Thyroid- normal to palpation, no stridor CHEST: Clear to P&A HEART: RRR, no m/g/r heard ABDOMEN: Soft and nl; nml bowel sounds; no organomegaly or masses noted ZOX:WRUE, nl pulses, no edema  NEURO: Grossly intact to observation      Impression & Recommendations:  Problem # 1:  ALLERGIC RHINITIS CAUSE UNSPECIFIED (ICD-477.9)  Question if she has a significant atopic pattern or is sensitive to nonspecific irritants. We can assess her IgE status. The following medications were  removed from the medication list:    Veramyst 27.5 Mcg/spray Susp (Fluticasone furoate) .Marland Kitchen..Marland Kitchen Two sprays once daily for one week, then as needed    Astepro 0.15 % Soln (Azelastine hcl) .Marland Kitchen... 2 sprays each evening Her updated medication list for this problem includes:    Benadryl 25 Mg Caps (Diphenhydramine hcl) .Marland Kitchen... 1-2 tablets every 4 hours as needed    Nasal Moisturizer 0.65 % Soln (Saline) .Marland Kitchen... As needed  Problem # 2:  SKIN RASH (ICD-782.1) She says benadryl suppresses this. No clear pattern. I can't tell if she is trying to describe an atopic dermatitiis.  Medications Added to Medication List This Visit: 1)  Topiramate 25 Mg Tabs (Topiramate) .... Take 1 by mouth two times a day x 1 week, take 2 by mouth two times a day x 1 week, take 3 by mouth two times a day x 1 week.  Other Orders: T-Allergy Profile Region [place], [place], [place], [place], [place] ([phone]) T-Food Allergy Profile Specific IgE ([id number]) Consultation Level IV (98119)  Patient Instructions: 1)  Return as able for allergy skin tests- Stop all antihistamines 3 days before skin testing, including cold and allergy meds, otc sleep and cough meds.  2)  Lab

## 2010-08-31 NOTE — Progress Notes (Signed)
Summary: sharp pain in R lung  Phone Note Call from Patient Call back at Home Phone [phone]   Caller: Patient Call For: tammy parrett Summary of Call: pt was seen by tp 08/31/09. pt c/o of cough w/ white/ yellow mucus x 2 wks. this will be her 3rd day taking prednisone. pt says she has sharp pain in lung area "whenever she moves around". this x 2days. pt denies fever although she says her spouse told her she felt "a little warm". pt says that she usually does well on doxycycline. [facility] on [address]. call pt at above # [phone] Initial call taken by: Tivis Ringer, CNA,  September 02, 2009 12:24 PM  Follow-up for Phone Call        mr pls advise if you want to give pt doxy   Philipp Deputy [facility]  September 02, 2009 1:19 PM   Additional Follow-up for Phone Call Additional follow up Details #1::        vascular US sept 2010 neg for DVT. CT angio July 2010 negative for PE. She is not on OCP. So, low risk for PE.  If she is not having limb swelling or coughing blood, then okay for doxycycline 100mg  by mouth two times a day x 6 days after meals. If worse, go to er Additional Follow-up by: Kalman Shan MD,  September 02, 2009 3:40 PM    Additional Follow-up for Phone Call Additional follow up Details #2::    called, spoke with pt.  Pt denies hemoptysis and limb swelling.  Pt informed MR ok'd doxy 100mg  two times a day x6 days after meals and if sxs become worse than to go to ER.  Requesting [facility] to be sent to [facility].  aware abx sent-verbalized understanding of instructions. Follow-up by: Gweneth Dimitri RN,  September 02, 2009 3:56 PM  New/Updated Medications: DOXYCYCLINE HYCLATE 100 MG CAPS (DOXYCYCLINE HYCLATE) take one cap by mouth two times a day after meals x6 days Prescriptions: DOXYCYCLINE HYCLATE 100 MG CAPS (DOXYCYCLINE HYCLATE) take one cap by mouth two times a day after meals x6 days  #12 x 0   Entered by:   Gweneth Dimitri RN   Authorized by:   Kalman Shan MD  Signed by:   Gweneth Dimitri RN on 09/02/2009   Method used:   Electronically to        [facility] (retail)       [address]       [place], [place]  [address]       Ph: [phone]       Fax: [phone]   RxID:   [id number]

## 2010-08-31 NOTE — Assessment & Plan Note (Signed)
Summary: allergy testing/apc   Vital Signs:  Patient profile:   45 year old female Menstrual status:  regular Height:      72 inches Weight:      278 pounds BMI:     37.84 O2 Sat:      100 % on Room air Pulse rate:   69 / minute BP sitting:   104 / 62  (left arm) Cuff size:   large  Vitals Entered By: Reynaldo Minium CMA (Nov 30, 2009 3:12 PM)  O2 Flow:  Room air  Copy to:  Kalman Shan, MD  Primary Provider/Referring Provider:  [facility]    History of Present Illness: History of Present Illness: September 30, 2009 44 yoF Bipolar,followed at [facility] and referred now by Dr Marchelle Gearing for allergy evaluation. Hx of OSA and vocal cord dysfunction. Her complaint is of nose bleeds, sinus congestion, feeling run down and pains in chest, recurrent rashes with mouth blisters. Perennial postnasal drip worse in Spring and Fall. Triggers include strong odors and irritants. She suspects some undefined foods and is trying now to eat Vegan. Stays on benadryl to suppress rash and throat drainage. Latex contact dermatitis. Mother atopic- helped by allergy vaccine. Son with allergies to grass pollen and eggs. Mecholyl study negative. Celiac work up negative but choses to be on gluten free diet. Dr Vassie Loll manages for OSA on CPAP.  No hx ENT surgery. House, new to them x 2 years. Dogs. No basement, carpet or mold. CA plus airfilter.  Nov 30, 2009- Allergic rhintis, bronchitis.  Increased head and chest congestion off antihistamines this week. Skin test- limited positives for ragweed and some trees. As we finished today, she mentioned concern of possible stinging insect reaction in the past, without specific history.    Current Medications (verified): 1)  Benadryl 25 Mg Caps (Diphenhydramine Hcl) .Marland Kitchen.. 1-2 Tablets Every 4 Hours As Needed 2)  Clonazepam 0.5 Mg Tabs (Clonazepam) .Marland Kitchen.. 1 Tab By Mouth Twice Daily As Needed Anxiety 3)  Aspirin 325 Mg Tabs (Aspirin) .... 2 Tablets Every 4-6 Hours As  Needed 4)  Ra Daytime Cold/flu 30-325-10 Mg/81ml Liqd (Pseudoephedrine-Apap-Dm) .... As Directed On Package 5)  Epipen 0.3 Mg/0.72ml Devi (Epinephrine) .... As Needed For Severe Allergic Reaction 6)  Nasal Moisturizer 0.65 % Soln (Saline) .... As Needed 7)  Levothyroxine Sodium 50 Mcg Tabs (Levothyroxine Sodium) .... Take 1 Tablet By Mouth Once A Day  Allergies (verified): 1)  ! Wellbutrin 2)  ! Celexa  Past History:  Past Surgical History: Last updated: 08/05/2009 s/p tubal ligation 11/12/2002 C sections x 2  Family History: Last updated: 08/05/2009 Family History Breast Cancer--mother and mgm Family History MI/Heart Attack--pgf Family History Emphysema --pgm Family History Asthma--mother Family History Hypertension--father son - VCD Family History of Irritable Bowel Syndrome:Mother  Family History of Diabetes: Father  No FH of Colon Cancer: Family History of Colon Polyps:Mother   Social History: Last updated: 09/30/2009 Lives with husband, 5 boys, 1 girl, 4 dogs Unemployed Patient states former smoker. quit 2008 Alcohol Use - no Daily Caffeine Use: 2 daily  Illicit Drug Use - no  Risk Factors: Alcohol Use: 0 (12/13/2008) Caffeine Use: 1 (12/13/2008) Exercise: no (12/13/2008)  Risk Factors: Smoking Status: quit (01/26/2009)  Past Medical History: #"ASTHMA" and DYSPNEA > acute visits 4/24 to ER, [facility], [facility],  12/10/2008 to er >Normal CXR 11/20/2008, 02/09/2008, 12/23/2007 -  >Spiro 12/08/2008: FeV1 2.68L/82%, FVC 3.3L.78%, Ratio 81 (82), Fef 25-75%: 3.4/96% >negative  methacholine challenge test July 2010 >Normal CT chest 02/24/2009 (PE ruled out during ER visit and hospitalization) >CPST 02/28/2009: obesity as primary cause for dyspnea. NEgative test for exercise induced bronchospasm > [place]ary 03/23/2009 - VCD +  #ALLERGY SINUS TROUBLE- Skin test 11/30/09 #BIPOLAR/PTSD/OCD #MORBID OBESITY - BMI 49 > 360# in June 2010 -> 291# FEb  2011 #Moderate Sleep APnea Abnormal Sleep Study-[facility]A-started on CPAP-12/2008.  #Diffuse Hepatic STeatosis >seen on Ct chest july 2010 #Thyroid Nodule on CT chest july 2010 > s/p bx 04/28/2009 - lymphocytes, histiocytes, and debris  Review of Systems      See HPI  The patient denies anorexia, fever, weight loss, weight gain, vision loss, decreased hearing, hoarseness, chest pain, syncope, dyspnea on exertion, peripheral edema, prolonged cough, headaches, hemoptysis, and severe indigestion/heartburn.    Physical Exam  Additional Exam:  General: A/Ox3; pleasant and cooperative, NAD, overweight, talkative SKIN: no rash, lesions NODES: no lymphadenopathy HEENT: [place]/AT, EOM- WNL, Conjuctivae- clear, PERRLA, TM-WNL, Nose- clear, Throat- clear and wnl, Mallampati  II,  NECK: Supple w/ fair ROM, JVD- none, normal carotid impulses w/o bruits Thyroid- , no stridor CHEST: Clear to P&A HEART: RRR, no m/g/r heard ABDOMEN: Soft and nl;  EAV:WUJW, nl pulses, no edema  NEURO: Grossly intact to observation      Impression & Recommendations:  Problem # 1:  ALLERGIC RHINITIS CAUSE UNSPECIFIED (ICD-477.9) Limited skin test positivity. I'm not impressed that this corresponds to her complaints. We discussed symptomatic management and environmental controls first. Her updated medication list for this problem includes:    Benadryl 25 Mg Caps (Diphenhydramine hcl) .Marland Kitchen... 1-2 tablets every 4 hours as needed    Nasal Moisturizer 0.65 % Soln (Saline) .Marland Kitchen... As needed  Problem # 2:  ASTHMA (ICD-493.90) Methacholine challenge was negative in the past. Obesity with hypoventilation and vocal cord dysfunction may be greater problems for her.  Other Orders: Est. Patient Level II (11914) Allergy Puncture Test (78295) Allergy I.D Test (62130)  Patient Instructions: 1)  Please schedule a follow-up appointment as needed. 2)  if you are concerned about allergic reaction to stinging insects, beyond what we have  talked about today, then suggest you see Dr Kathyrn Lass group

## 2010-08-31 NOTE — Assessment & Plan Note (Signed)
Summary: sleep followup//lmr   Visit Type:  Follow-up Copy to:  Kalman Shan, MD  Primary Stacey [name]/Referring Stacey [name]:  Stacey [name]   CC:  Pt here for follow up. Pt states is using CPAP every night. Pt request flu vax.  History of Present Illness:  45/F, obese, bipolar for FU of moderate obstructive sleep apnea  Reviewed PSG >> moderate obstructive sleep apnea with predominant hypopneas & RERAs, RDI 21/h, lowest destauration 82%, no arryhtmias or significant PLMs. AutoCPAP 5-15 was set up on 01/31/09. She has had good results. Feels refreshed, tolerating well, pressure OK, mask Ok, no dryness. Reviewed download 7/2- 7/19 >> avg pressure 10.5, residual AHI 6.8, leak OK, good compliance.  7/31- 03/17/09 >>avg pr 12 cm,    April 19, 2010 12:23 PM  has lost 70 lbs  from 343 to 278 lbs over the last yr!  Reports good compliance, husband has not noted snoring, denies excessive daytime somnolence , neurontin helping cough & bipolar symptoms  Preventive Screening-Counseling & Management  Alcohol-Tobacco     Alcohol drinks/day: 0     Smoking Status: quit     Packs/Day: 0.5     Year Quit: 2008  Current Medications (verified): 1)  Benadryl 25 Mg Caps (Diphenhydramine Hcl) .Marland Kitchen.. 1-2 Tablets Every 4 Hours As Needed 2)  Ibuprofen 200 Mg Caps (Ibuprofen) .... Take 2 To 3 Tabs By Mouth At Bedtime and As Needed During The Day 3)  Ra Daytime Cold/flu 30-325-10 Mg/14ml Liqd (Pseudoephedrine-Apap-Dm) .... As Directed On Package 4)  Epipen 0.3 Mg/0.32ml Devi (Epinephrine) .... As Needed For Severe Allergic Reaction 5)  Nasal Moisturizer 0.65 % Soln (Saline) .... As Needed 6)  Levothyroxine Sodium 50 Mcg Tabs (Levothyroxine Sodium) .... Take 1 Tablet By Mouth Once A Day 7)  Loratadine 10 Mg Tabs (Loratadine) .... Take 1 Tablet By Mouth Two Times A Day 8)  Topamax 100 Mg Tabs (Topiramate) .... Take 1 Tablet By Mouth Two Times A Day 9)  Mucinex 600 Mg Xr12h-Tab (Guaifenesin) .Marland Kitchen.. 1 Tab Every 4 Hours  As Needed As Directed By Dr. Audria Nine 10)  Ventolin Hfa 108 (90 Base) Mcg/act Aers (Albuterol Sulfate) .... Inhale 2 Puffs Every Four Hours As Needed 11)  Neurontin 300 Mg Caps (Gabapentin) .... Take  1 Capsusle Daily For 3 Days Then 1 Cap Two Times A Day X 3 Days Then 1 Cap Three Times A Day To Continue 12)  Fluticasone Propionate 50 Mcg/act Susp (Fluticasone Propionate) .... 2 Squirts Each Nostril Daily 13)  Omeprazole 20 Mg Cpdr (Omeprazole) .Marland Kitchen.. 1 Capsule On Empty Stomach in Morning  Allergies (verified): 1)  ! Wellbutrin 2)  ! Celexa  Past History:  Past Medical History: Last updated: 03/29/2010 #"ASTHMA" and DYSPNEA...Marland KitchenDr Marchelle Gearing > acute visits 4/24 to ER, [facility], [facility],  12/10/2008 to er >Normal CXR 11/20/2008, 02/09/2008, 12/23/2007 -  >Spiro 12/08/2008: FeV1 2.68L/82%, FVC 3.3L.78%, Ratio 81 (82), Fef 25-75%: 3.4/96% >negative methacholine challenge test July 2010 >Normal CT chest 02/24/2009 (PE ruled out during ER visit and hospitalization) >CPST 02/28/2009: obesity as primary cause for dyspnea. NEgative test for exercise induced bronchospasm > [place] 03/23/2009 - VCD +  #ALLERGY SINUS TROUBLE.... Dr Fannie Knee  > Skin test 11/30/09 Skin test- limited positives for ragweed and some trees. #BIPOLAR/PTSD/OCD #MORBID OBESITYwith intentionalweight loss -  > BMI 49 > BMI 36.8 Augutst 2011 > 360# in June 2010 -> 291# FEb 2011 #Moderate Sleep APnea.Marland KitchenMarland KitchenDr Vassie Loll > y-OSA-started on CPAP-12/2008.  #Diffuse Hepatic STeatosis >seen on Ct chest  july 2010 #Thyroid Nodule on CT chest july 2010 > s/p bx 04/28/2009 - lymphocytes, histiocytes, and debris  Social History: Last updated: 09/30/2009 Lives with husband, 5 boys, 1 girl, 4 dogs Unemployed Patient states former smoker. quit 2008 Alcohol Use - no Daily Caffeine Use: 2 daily  Illicit Drug Use - no  Social History: Packs/Day:  0.5  Review of Systems  The patient denies anorexia, fever, weight loss,  weight gain, vision loss, decreased hearing, hoarseness, chest pain, syncope, dyspnea on exertion, peripheral edema, prolonged cough, headaches, hemoptysis, abdominal pain, melena, hematochezia, severe indigestion/heartburn, hematuria, muscle weakness, suspicious skin lesions, difficulty walking, depression, unusual weight change, abnormal bleeding, enlarged lymph nodes, and angioedema.    Vital Signs:  Patient profile:   45 year old female Menstrual status:  regular Height:      72 inches Weight:      278.50 pounds BMI:     37.91 O2 Sat:      97 % on Room air Temp:     98.3 degrees F oral Pulse rate:   89 / minute BP sitting:   118 / 80  (left arm) Cuff size:   large  Vitals Entered By: Zackery Barefoot CMA (April 19, 2010 11:55 AM)  O2 Flow:  Room air CC: Pt here for follow up. Pt states is using CPAP every night. Pt request flu vax Comments Medications reviewed with patient Verified contact number and pharmacy with patient Zackery Barefoot CMA  April 19, 2010 11:55 AM    Physical Exam  Additional Exam:  General: A/Ox3; pleasant and cooperative, NAD, obese  SKIN: no rash, lesions NODES: no lymphadenopathy HEENT: /AT, EOM- WNL, Conjuctivae- clear, PERRLA, TM-WNL, Nose- clear, Throat- clear and wnl, Mallampati  II NECK: Supple w/ fair ROM, JVD- none, normal carotid impulses w/o bruits Thyroid- , no stridor CHEST: Clear to P&A, no wheezing or psuedowheezing.  HEART: RRR, no m/g/r heard QIO:NGEX, nl pulses, no edema        Impression & Recommendations:  Problem # 1:  OBSTRUCTIVE SLEEP APNEA (ICD-780.57) Obtian download on current auto settings Given significant wt loss, would expect lower avg pressure needs If so, can change to fixed level CPAP Compliance encouraged, wt loss emphasized, asked to avoid meds with sedative side effects, cautioned against driving when sleepy.  If continues to lose wt, consider rpt study to see if PSA resolved. Orders: Est. Patient  Level III (52841) DME Referral (DME)  Medications Added to Medication List This Visit: 1)  Ibuprofen 200 Mg Caps (Ibuprofen) .... Take 2 to 3 tabs by mouth at bedtime and as needed during the day  Patient Instructions: 1)  Copy sent to: dr Marchelle Gearing 2)  Please schedule a follow-up appointment in 1 year. 3)  We will obtain download fro your machine & make changes as needed

## 2010-08-31 NOTE — Progress Notes (Signed)
Summary: triage/sinustis/  Phone Note Call from Patient   Caller: Patient Reason for Call: Talk to Nurse Summary of Call: Patient states [facility] this past [facility] for Sinusitis (see report).  She states her face and sinuses are swollen and that they gave her prednisone..She says she has still has a dull headache and her face is swollen..Patient denies SOB difficulety breathing.Marland KitchenMarland KitchenShe says the predinisone has set off her bipolar disorder and that she has talked to Mental Health and they said we need to release her first..Marland KitchenAdvised patient we have an 8:30 appt with Dr. Audria Nine tomorrow and nothing today.Marland KitchenMarland KitchenAdvised her to go to ED if she felt she was having a crisis and couldn't wait until then. Initial call taken by: Conchita Paris,  September 19, 2009 9:44 AM

## 2010-08-31 NOTE — Progress Notes (Signed)
Summary: talk to nurse  Phone Note Call from Patient   Caller: Patient Call For: parrett Summary of Call: pt have question about directions on check out sheet. she was seen on yesterday Initial call taken by: Rickard Patience,  March 14, 2010 1:29 PM  Follow-up for Phone Call        Pt states that TP and she talked about using/trying Zyrtec once daily but checkout sheet states take claritin once daily-pt is already  on loratadine two times a day. Please advise.Pt okayed for leaving a message on answering Golden Hurter CMA  March 14, 2010 1:41 PM   Additional Follow-up for Phone Call Additional follow up Details #1::        She can use Zyrtec 10mg  in am and benadryl as previously recommended.  Additional Follow-up by: Rubye Oaks NP,  March 14, 2010 2:12 PM    Additional Follow-up for Phone Call Additional follow up Details #2::    Spoke with pt; aware of recs from TP.Reynaldo Minium CMA  March 14, 2010 2:36 PM

## 2010-08-31 NOTE — Progress Notes (Signed)
Summary: neurontin/ FYI  Phone Note Call from Patient Call back at Home Phone [phone]   Caller: Patient Call For: ramaswamy Summary of Call: FYI only for NR: neurontin working great "YEAH and HUGS" per pt. no call back needed.  Initial call taken by: Tivis Ringer, CNA,  April 19, 2010 12:39 PM  Follow-up for Phone Call        great. I am glad Follow-up by: Kalman Shan MD,  April 19, 2010 6:49 PM

## 2010-08-31 NOTE — Assessment & Plan Note (Signed)
Summary: bronchitis, VCD, allergies   Visit Type:  Follow-up Copy to:  Kalman Shan, MD  Primary Provider/Referring Provider:  Dala Dock   CC:  Acute visit. Stacey [name]. [name] c/o a runny nose and cough with yellow mucus for 3 days.Marland Kitchen  History of Present Illness: ACUTE OFFICE VISIT 07/06/2009: Followup VCD, thyroid nodule and for new acute sinusitis.  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 She has not attended speech therapy due to issues with cellulitis in RLE which has since healed. Started attending speech therapy today. REgarding thyroid nodule underwent bx 04/01/2009. Showed lymphocytes, histiocytes and debris. She never followed this up with [facility] or saw endocrinologist. Now she has acute sinus symptoms wiht green/yellow mucus and post nasal drainage, subjective feeling of fever, worsening myalgias and cough. There is some streaky associated hemoptysis as well. Denies sick contacts. Of note, she has lost 60# weight since June 2010 intentionally. Also, says she is eating gluten free diet and this has helped iwth weight loss and chronic diarrhea to resolve  Acute visit 08/10/09 -- 45 yo pt of MR with VCD, hx sinus dz, allergic rhinitis. Tells me that her nasal drip and congestion have been worse over about 2 weeks. Has been on her benadryl and veramyst. Not reliable with daily NSW's. No fever. She is coughing up white to yellow thick mucous.  b Current Medications (verified): 1)  Benadryl 25 Mg Caps (Diphenhydramine Hcl) .Marland Kitchen.. 1-2 Tablets Every 4 Hours As Needed 2)  Clonazepam 0.5 Mg Tabs (Clonazepam) .... As Needed Anxiety 3)  Veramyst 27.5 Mcg/spray Susp (Fluticasone Furoate) .... Two Sprays Once Daily For One Week, Then As Needed 4)  Aspirin 325 Mg Tabs (Aspirin) .... 2 Tablets Every 4-6 Hours Prn 5)  Ra Daytime Cold/flu 30-325-10 Mg/43ml Liqd (Pseudoephedrine-Apap-Dm) .... As Directed 6)  Epipen 0.3 Mg/0.1ml Devi (Epinephrine) .... As Needed  Allergies (verified): 1)   ! Wellbutrin 2)  ! Celexa  Vital Signs:  [name] profile:   45 year old female Menstrual status:  regular Height:      72 inches (182.88 cm) Weight:      297 pounds (135 kg) BMI:     40.43 O2 Sat:      97 % on Room air Temp:     98.3 degrees F (36.83 degrees C) oral Pulse rate:   85 / minute BP sitting:   118 / 80  (left arm) Cuff size:   regular  Vitals Entered By: Michel Bickers CMA (August 10, 2009 2:30 PM)  O2 Flow:  Room air  Physical Exam  General:  obese.   Head:  normocephalic and atraumatic Eyes:  PERRLA and EOMI.   Ears:  mild nasal congestion Nose:  clear nasal discharge, mild frontal sinus tenderness Mouth:  no deformity or lesions Neck:  no JVD.   Chest Wall:  no deformities noted Lungs:  clear bilaterally to auscultation and percussion Heart:  regular rate and rhythm, S1, S2 without murmurs, rubs, gallops, or clicks Abdomen:  not examined Msk:  no deformity or scoliosis noted with normal posture Neurologic:  non-focal Skin:  intact without lesions or rashes Psych:  anxious and easily distracted.     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 [name] Level  III [phone])  Medications Added to Medication List This Visit: 1)  Doxycycline Hyclate 100 Mg Caps (Doxycycline hyclate) .Marland Kitchen.. 1 by mouth two times a day  [name] Instructions: 1)  Continue benadry ald Veramyst 2)  Add daily nasal saline washes 3)  Take doxycycline 100mg  by mouth two times a day for 7 days.  4)  Call our office if you do not improve in 7 - 10 days. Prescriptions: DOXYCYCLINE HYCLATE 100 MG CAPS (DOXYCYCLINE HYCLATE) 1 by mouth two times a day  #14 x 0   Entered and Authorized by:   Leslye Peer MD   Signed by:    Leslye Peer MD on 08/10/2009   Method used:   Electronically to        [facility] (retail)       [address]       [place], [place]  [address]       Ph: [phone]       Fax: [phone]   RxID:   [id number]

## 2010-08-31 NOTE — Assessment & Plan Note (Signed)
Summary: Acute NP office visit - bronchitis   Copy to:  Kalman Shan, MD  Primary Provider/Referring Provider:  [facility]   CC:  dyspnea, wheezing, tightness in chest, prod cough with yellow/white mucus, fatigue x1week, and worsening over night.  denies f/c/s  finished round of doxy given by RB.  History of Present Illness: ACUTE OFFICE VISIT 07/06/2009: Followup VCD, thyroid nodule and for new acute sinusitis.  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 She has not attended speech therapy due to issues with cellulitis in RLE which has since healed. Started attending speech therapy today. REgarding thyroid nodule underwent bx 04/01/2009. Showed lymphocytes, histiocytes and debris. She never followed this up with [facility] or saw endocrinologist. Now she has acute sinus symptoms wiht green/yellow mucus and post nasal drainage, subjective feeling of fever, worsening myalgias and cough. There is some streaky associated hemoptysis as well. Denies sick contacts. Of note, she has lost 60# weight since June 2010 intentionally. Also, says she is eating gluten free diet and this has helped iwth weight loss and chronic diarrhea to resolve  Acute visit 08/10/09 -- 45 yo pt of MR with VCD, hx sinus dz, allergic rhinitis. Tells me that her nasal drip and congestion have been worse over about 2 weeks. Has been on her benadryl and veramyst. Not reliable with daily NSW's. No fever. She is coughing up white to yellow thick mucous.   August 31, 2009 --Presents for an acute office visit. Complains of dyspnea, wheezing, tightness in chest, prod cough with yellow/white mucus, fatigue x1week, worsening over night.  denies f/c/s  Last visit tx w/ Doxycycline for 7 days. Went to ER 08/14/09 , CXR was neg. Cardiac enzymes neg. Denies  , orthopnea, hemoptysis, fever, n/v/d, edema, headache. .   Medications Prior to Update: 1)  Benadryl 25 Mg Caps (Diphenhydramine Hcl) .Marland Kitchen.. 1-2 Tablets Every 4 Hours As Needed 2)   Clonazepam 0.5 Mg Tabs (Clonazepam) .... As Needed Anxiety 3)  Veramyst 27.5 Mcg/spray Susp (Fluticasone Furoate) .... Two Sprays Once Daily For One Week, Then As Needed 4)  Aspirin 325 Mg Tabs (Aspirin) .... 2 Tablets Every 4-6 Hours Prn 5)  Ra Daytime Cold/flu 30-325-10 Mg/59ml Liqd (Pseudoephedrine-Apap-Dm) .... As Directed 6)  Epipen 0.3 Mg/0.8ml Devi (Epinephrine) .... As Needed 7)  Doxycycline Hyclate 100 Mg Caps (Doxycycline Hyclate) .Marland Kitchen.. 1 By Mouth Two Times A Day  Current Medications (verified): 1)  Benadryl 25 Mg Caps (Diphenhydramine Hcl) .Marland Kitchen.. 1-2 Tablets Every 4 Hours As Needed 2)  Clonazepam 0.5 Mg Tabs (Clonazepam) .Marland Kitchen.. 1 Tab By Mouth Twice Daily As Needed Anxiety 3)  Veramyst 27.5 Mcg/spray Susp (Fluticasone Furoate) .... Two Sprays Once Daily For One Week, Then As Needed 4)  Aspirin 325 Mg Tabs (Aspirin) .... 2 Tablets Every 4-6 Hours As Needed 5)  Ra Daytime Cold/flu 30-325-10 Mg/50ml Liqd (Pseudoephedrine-Apap-Dm) .... As Directed On Package 6)  Epipen 0.3 Mg/0.44ml Devi (Epinephrine) .... As Needed For Severe Allergic Reaction 7)  Nasal Moisturizer 0.65 % Soln (Saline) .... As Needed  Allergies (verified): 1)  ! Wellbutrin 2)  ! Celexa  Past History:  Past Medical History: Last updated: 07/06/2009 #"ASTHMA" and DYSPNEA > acute visits 4/24 to ER, 4/[facility] to [facility], [facility]/30/[facility] to [facility],  12/10/2008 to er >Normal CXR 11/20/2008, 02/09/2008, 12/23/2007 -  >Spiro 12/08/2008: FeV1 2.68L/82%, FVC 3.3L.78%, Ratio 81 (82), Fef 25-75%: 3.4/96% >negative methacholine challenge test July 2010 >Normal CT chest 02/24/2009 (PE ruled out during ER visit and hospitalization) >CPST 02/28/2009: obesity as primary cause  for dyspnea. NEgative test for exercise induced bronchospasm > [place] 03/23/2009 - VCD +  #ALLERGY SINUS TROUBLE #BIPOLAR/PTSD/OCD #MORBID OBESITY - BMI 49 > 360# in June 2010 -> 300# Dec 2010 #Moderate Sleep APnea Abnormal Sleep Study-[facility]-started on CPAP-12/2008.    #Diffuse Hepatic STeatosis >seen on Ct chest july 2010 #Thyroid Nodule on CT chest july 2010 > s/p bx 04/28/2009 - lymphocytes, histiocytes, and debris  Past Surgical History: Last updated: 08/05/2009 s/p tubal ligation 11/12/2002 C sections x 2  Family History: Last updated: 08/05/2009 Family History Breast Cancer--mother and mgm Family History MI/Heart Attack--pgf Family History Emphysema --pgm Family History Asthma--mother Family History Hypertension--father son - VCD Family History of Irritable Bowel Syndrome:Mother  Family History of Diabetes: Father  No FH of Colon Cancer: Family History of Colon Polyps:Mother   Social History: Last updated: 08/05/2009 Lives with husband, 5 boys, 1 girl, 4 dogs Unemployed Patient states former smoker.  Alcohol Use - no Daily Caffeine Use: 2 daily  Illicit Drug Use - no  Risk Factors: Alcohol Use: 0 (12/13/2008) Caffeine Use: 1 (12/13/2008) Exercise: no (12/13/2008)  Risk Factors: Smoking Status: quit (01/26/2009)  Review of Systems      See HPI  Vital Signs:  Patient profile:   45 year old female Menstrual status:  regular Height:      72 inches Weight:      290.50 pounds BMI:     39.54 O2 Sat:      100 % on Room air Temp:     98.1 degrees F oral Pulse rate:   70 / minute BP sitting:   106 / 76  (left arm) Cuff size:   large  Vitals Entered By: Boone Master CNA (August 31, 2009 11:22 AM)  O2 Flow:  Room air CC: dyspnea, wheezing, tightness in chest, prod cough with yellow/white mucus, fatigue x1week, worsening over night.  denies f/c/s  finished round of doxy given by RB Is Patient Diabetic? No Comments Medications reviewed with patient Daytime contact number verified with patient. Boone Master CNA  August 31, 2009 11:22 AM    Physical Exam  Additional Exam:  Gen. Morbidly obese female  ENT - no lesions, no post nasal drip Neck: No JVD, no thyromegaly, no carotid bruits Lungs: no use of accessory  muscles, no dullness to percussion, clear without rales or rhonchi  Cardiovascular: Rhythm regular, heart sounds  normal, no murmurs or gallops, no peripheral edema Musculoskeletal: No deformities, no cyanosis or clubbing  EXT : warm, venous insufficiency, neg calf tendrness Neuro: intact w/ no focal deficits noted.      Impression & Recommendations:  Problem # 1:  ACUTE BRONCHITIS (ICD-466.0)  Recurrent bronchitis -slow to resovle , recent xray w/ no acute changes.  REC:  Restart Veramyst 2 puffs two times a day  Add Astepro 2 puffs at bedtime  Prednisone taper over next week.  Increase fluids.  follow up Dr. Marchelle Gearing next week as scheduled and as needed  Please contact office for sooner follow up if symptoms do not improve or worsen  The following medications were removed from the medication list:    Doxycycline Hyclate 100 Mg Caps (Doxycycline hyclate) .Marland Kitchen... 1 by mouth two times a day Her updated medication list for this problem includes:    Ra Daytime Cold/flu 30-325-10 Mg/52ml Liqd (Pseudoephedrine-apap-dm) .Marland Kitchen... As directed on package    Doxycycline Hyclate 100 Mg Caps (Doxycycline hyclate) .Marland Kitchen... Take one cap by mouth two times a day after meals x6 days  Orders: Est. Patient Level III ([address])  Medications Added to Medication List This Visit: 1)  Clonazepam 0.5 Mg Tabs (Clonazepam) .Marland Kitchen.. 1 tab by mouth twice daily as needed anxiety 2)  Aspirin 325 Mg Tabs (Aspirin) .... 2 tablets every 4-6 hours as needed 3)  Ra Daytime Cold/flu 30-325-10 Mg/79ml Liqd (Pseudoephedrine-apap-dm) .... As directed on package 4)  Epipen 0.3 Mg/0.67ml Devi (Epinephrine) .... As needed for severe allergic reaction 5)  Nasal Moisturizer 0.65 % Soln (Saline) .... As needed 6)  Prednisone 10 Mg Tabs (Prednisone) .... 4 tabs for 2 days, then 3 tabs for 2 days, 2 tabs for 2 days, then 1 tab for 2 days, then stop  Patient Instructions: 1)  Restart Veramyst 2 puffs two times a day  2)  Add Astepro 2  puffs at bedtime  3)  Prednisone taper over next week.  4)  Increase fluids.  5)  follow up Dr. Marchelle Gearing next week as scheduled and as needed  6)  Please contact office for sooner follow up if symptoms do not improve or worsen  Prescriptions: PREDNISONE 10 MG TABS (PREDNISONE) 4 tabs for 2 days, then 3 tabs for 2 days, 2 tabs for 2 days, then 1 tab for 2 days, then stop  #20 x 0   Entered and Authorized by:   Rubye Oaks NP   Signed by:   Rubye Oaks NP on 08/31/2009   Method used:   [facility]       [address]       [place], [place]  [address]       Ph: [phone]       Fax: [phone]   RxID:   [id number]    Immunization History:  Pneumovax Immunization History:    Pneumovax:  historical (04/29/2009)   Appended Document: levothyroxine addition to med list Medications Added LEVOTHYROXINE SODIUM 50 MCG TABS (LEVOTHYROXINE SODIUM) Take 1 tablet by mouth once a day       at OV, pt had a lab result sheet from [facility] with a TSH result a recommendation to begin levothyroxine.  this result sheet has been copied and sent to be scanned into EMR and the levothyroxine added to pt's med list. Boone Master CNA  September 05, 2009 10:27 AM   Clinical Lists Changes  Medications: Added new medication of LEVOTHYROXINE SODIUM 50 MCG TABS (LEVOTHYROXINE SODIUM) Take 1 tablet by mouth once a day

## 2010-08-31 NOTE — Progress Notes (Signed)
Summary: cough/ congestion  Phone Note Call from Patient   Caller: Patient Call For: ramaswamy Summary of Call:  congestion and cough would like sample of veramyst Initial call taken by: Rickard Patience,  August 16, 2009 8:47 AM  Follow-up for Phone Call        [facility]. Carron Curie CMA  August 16, 2009 9:44 AM  called and spoke with pt.  pt states she saw RB for a sick visit on 08-10-2009.  Pt was instructed to take Doxycycline, Benadryl, Veramyst and nasal saline rinses. Pt states she is doing all of this. Pt states she is almost finished with abx but believes she needs another round.  Pt c/o still coughing up frothy white to yellow colored sputum.  Pt denied fever or changes in breathing.  Will forward to RB to address.  Also, pt states she finished Veramyst sample and requested 1 more box. sample left at front desk.  pt aware. Aundra Millet Reynolds LPN  August 16, 2009 11:33 AM  No clear indication to extend abx course - If no change on doxy then doubt a longer course will change things. Suspect that sgressive treatment of her allergies will help - agree with the veramyst, benadryl, consider adding daily nasal saline washes. Leslye Peer MD  August 17, 2009 8:53 AM    Follow-up by: Leslye Peer MD,  August 17, 2009 8:53 AM  Additional Follow-up for Phone Call Additional follow up Details #1::        LMT[facility]. Carron Curie CMA  August 17, 2009 9:25 AM pt advised of RB recs. Asked pt if she wanted an appt because pt was not satisfied with response, she stated she just got up and will call back if she needs an appt. Carron Curie CMA  August 17, 2009 10:05 AM

## 2010-08-31 NOTE — Progress Notes (Signed)
Summary: referral to [facility] ENT made  Phone Note Call from Patient Call back at Home Phone [phone]   Caller: Patient Call For: Ramaswamy Reason for Call: Talk to Nurse Summary of Call: pt called pcp, said to cal MR - MR wanted her to see ENT Friday, she doesn't have insurance.  Checked with [facility] and they recommended [facility].  ENT is telling her $130 to come in and she doesn't have this.  Spoke to MR last week about this too.  [facility] only requires $50 and then they work out a Surveyor, quantity.  Please advise.  Has not c/x ENT yet.  Waiting for your advise.  Was only able to pick up medicine yesterday. Initial call taken by: Eugene Gavia,  April 05, 2010 10:02 AM  Follow-up for Phone Call        Pt states she does not have $130 that is required for ENT doctor here in [place], but [facility] told her [facility] ENT only charges $50 and sets p payments for the rest. Pt is [facility] to [facility]. Please advise. Carron Curie CMA  April 05, 2010 2:21 PM   Additional Follow-up for Phone Call Additional follow up Details #1::        ok . order done.  Additional Follow-up by: Kalman Shan MD,  April 05, 2010 2:45 PM    Additional Follow-up for Phone Call Additional follow up Details #2::    Spoke with pt and advised per MR that order was sent to [facility] for [facility] ENT ref.  Also I sched her rov with RA per her request since overdue to see him- appt was sched for 04/19/10 at 11:45 am. Follow-up by: Vernie Murders,  April 05, 2010 2:54 PM

## 2010-08-31 NOTE — Assessment & Plan Note (Signed)
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 Neurontin startred for VCD; she had refused repeat speech therapy and could not afford seeing ENT. Neurontin now upped to 400mg  three times a day by her pyschiatrist. Cough largely resolved. Still gets cough when exposed to stress, cigarettes, pollution or smoke or weather changes. Some few Adlene Adduci ago [facility] put her back on QVAR ( in spring 2011 my NP also had started her on qvar) despite her being MCT test negative for atshma in past. STates QVAR also helping. Wants to continue QVAR. Denies fever, nausea.   Preventive Screening-Counseling & Management  Alcohol-Tobacco     Alcohol drinks/day: 0     Smoking Status: quit     Packs/Day: 0.5     Year Started: 1987     Year Quit: 2008  Comments: pt states she would smoke som ethen stop for a while, then start back.   Current Medications (verified): 1)  Benadryl 25 Mg Caps (Diphenhydramine Hcl) .Marland Kitchen.. 1-2 Tablets Every 4 Hours As Needed 2)  Ibuprofen 200 Mg Caps (Ibuprofen) .... Take 2 To 3 Tabs By Mouth At Bedtime and As Needed During The Day 3)  Epipen 0.3 Mg/0.27ml Devi (Epinephrine) .... As Needed For Severe Allergic Reaction 4)  Levothyroxine Sodium 50 Mcg Tabs (Levothyroxine Sodium) .... Take 1 Tablet By Mouth Once A Day 5)  Loratadine 10 Mg Tabs (Loratadine) .... Take 1 Tablet By Mouth Two Times A Day 6)  Topamax 100 Mg Tabs (Topiramate) .... Take 1 Tablet By Mouth Two Times A Day 7)  Mucinex 600 Mg Xr12h-Tab (Guaifenesin) .Marland Kitchen.. 1 Tab Every 4 Hours As Needed As Directed By Dr. Audria Nine 8)  Ventolin Hfa 108 (90 Base) Mcg/act Aers (Albuterol  Sulfate) .... Inhale 2 Puffs Every Four Hours As Needed 9)  Qvar 40 Mcg/act Aers (Beclomethasone Dipropionate) .... 2 Puffs in Am 10)  Neurontin 400 Mg Caps (Gabapentin) .... Take 1 Tablet By Mouth Three Times A Day 11)  Lorazepam 1 Mg Tabs (Lorazepam) .... 1/2 To 1 Tab Three Times A Day 12)  Cetirizine Hcl 10 Mg Tabs (Cetirizine Hcl) .... Take 1 Tablet By Mouth Once A Day 13)  Aciphex 20 Mg Tbec (Rabeprazole Sodium) .... Take 1 Tablet By Mouth Once A Day 14)  Cepacol Mouthwash/gargle 0.05 % Liqd (Cetylpyridinium Chloride) .... As Needed  Allergies (verified): 1)  ! Wellbutrin 2)  ! Celexa  Past History:  Past medical, surgical, family and social histories (including risk factors) reviewed, and no changes noted (except as noted below).  Past Medical History: #"ASTHMA" and DYSPNEA...Marland KitchenDr Marchelle Gearing > acute visits 4/24 to ER, 4/28 to [facility], [facility],  12/10/2008 to er >Normal CXR 11/20/2008, 02/09/2008, 12/23/2007 -  >[facility] 12/08/2008: FeV1 2.68L/82%, FVC 3.3L.78%, Ratio 81 (82), Fef 25-75%: 3.4/96% >negative methacholine challenge test July 2010 >Normal CT chest 02/24/2009 (PE ruled out during ER visit and hospitalization) >CPST 02/28/2009: obesity as primary cause for dyspnea. NEgative test for exercise induced bronchospasm > [place] 03/23/2009 - VCD +  -Neurontin trial - SUCCESS in sept 2011  #ALLERGY SINUS TROUBLE.... Dr Fannie Knee  > Skin test 11/30/09 Skin test- limited positives for ragweed and  some trees. #BIPOLAR/PTSD/OCD #MORBID OBESITYwith intentionalweight loss -  > BMI 49 > BMI 36.8 Augutst 2011 > 360# in June 2010 -> 291# FEb 2011 #Moderate Sleep APnea.Marland KitchenMarland KitchenDr Vassie Loll > y-OSA-started on CPAP-12/2008.  #Diffuse Hepatic STeatosis >seen on Ct chest july 2010 #Thyroid Nodule on CT chest july 2010 > s/p bx 04/28/2009 - lymphocytes, histiocytes, and debris  Past Surgical History: Reviewed history from 08/05/2009 and no changes required. s/p tubal ligation 11/12/2002 C  sections x 2   Family History: Reviewed history from 08/05/2009 and no changes required. Family History Breast Cancer--mother and mgm Family History MI/Heart Attack--pgf Family History Emphysema --pgm Family History Asthma--mother Family History Hypertension--father son - VCD Family History of Irritable Bowel Syndrome:Mother  Family History of Diabetes: Father  No FH of Colon Cancer: Family History of Colon Polyps:Mother   Social History: Reviewed history from 09/30/2009 and no changes required. Lives with husband, 5 boys, 1 girl, 4 dogs Unemployed Patient states former smoker. quit 2008 Alcohol Use - no Daily Caffeine Use: 2 daily  Illicit Drug Use - no  Review of Systems  The patient denies anorexia, fever, weight loss, weight gain, vision loss, decreased hearing, hoarseness, chest pain, syncope, dyspnea on exertion, peripheral edema, prolonged cough, headaches, hemoptysis, abdominal pain, melena, hematochezia, severe indigestion/heartburn, hematuria, incontinence, genital sores, muscle weakness, suspicious skin lesions, transient blindness, difficulty walking, depression, unusual weight change, abnormal bleeding, enlarged lymph nodes, angioedema, breast masses, and testicular masses.    Vital Signs:  Patient profile:   45 year old female Menstrual status:  regular Height:      72 inches Weight:      287.50 pounds BMI:     39.13 O2 Sat:      100 % on Room air Temp:     97.8 degrees F oral Pulse rate:   86 / minute BP sitting:   110 / 68  (right arm) Cuff size:   large  Vitals Entered By: Carron Curie CMA (May 17, 2010 2:49 PM)  O2 Flow:  Room air CC: Pt here for 6 week follow-up to discuss neurontin. Comments Medications reviewed with patient Carron Curie CMA  May 17, 2010 2:51 PM Daytime phone number verified with patient.    Physical Exam  General:  obese but losing weight Head:  normocephalic and atraumatic Eyes:  PERRLA and EOMI.     Ears:  mild nasal congestion Nose:  clear nasal discharge, mild frontal sinus tenderness Mouth:  no deformity or lesions Neck:  no JVD.   Chest Wall:  no deformities noted Lungs:  clear bilaterally to auscultation and percussion Heart:  regular rate and rhythm, S1, S2 without murmurs, rubs, gallops, or clicks Abdomen:  obese soft non tender no rebound Msk:  no deformity or scoliosis noted with normal posture Pulses:  pulses normal Extremities:  wearing boots Neurologic:  non-focal Skin:  intact without lesions or rashes Cervical Nodes:  no significant adenopathy Axillary Nodes:  no significant adenopathy Psych:  depressed affect and anxious.     Impression & Recommendations:  Problem # 1:  COUGH (ICD-786.2) Assessment Improved Coiugh is from LPR (VCD and sinus). Cough is singiifcantly resolved with neurontin. She has asthma symptoms with subjective releif with QVAR but July 2010 methacholine test was negative for asthma. I discussed the implications of committing to long term ICS without formal confirmatory diagnosis of asthma. So, we resolved to repeat methacholine challenge test.   Flu shot today FU after mct Orders: Pulmonary Referral (Pulmonary) Est. Patient Level III (14782)  Medications Added to Medication List This Visit: 1)  Qvar 40 Mcg/act Aers (Beclomethasone dipropionate) .... 2 puffs in am 2)  Neurontin 400 Mg Caps (Gabapentin) .... Take 1 tablet by mouth three times a day 3)  Lorazepam 1 Mg Tabs (Lorazepam) .... 1/2 to 1 tab three times a day 4)  Cetirizine Hcl 10 Mg Tabs (Cetirizine hcl) .... Take 1 tablet by mouth once a day 5)  Aciphex 20 Mg Tbec (Rabeprazole sodium) .... Take 1 tablet by mouth once a day 6)  Cepacol Mouthwash/gargle 0.05 % Liqd (Cetylpyridinium chloride) .... As needed  Patient Instructions: 1)  please stop qvar 2)  have meathcholine challenge test (repeat) in 2-3 Jaquelynn Wanamaker 3)  return to see me after the test 4)  continue neurontin 5)   have flu shot today    Appended Document: flu shot Flu Vaccine Consent Questions     Do you have a history of severe allergic reactions to this vaccine? no    Any prior history of allergic reactions to egg and/or gelatin? no    Do you have a sensitivity to the preservative Thimersol? no    Do you have a past history of Guillan-Barre Syndrome? no    Do you currently have an acute febrile illness? no    Have you ever had a severe reaction to latex? no    Vaccine information given and explained to patient? yes    Are you currently pregnant? no    Lot Number:[id number]   Exp Date:01/27/2011   Site Given  Left Deltoid IM Carron Curie CMA  May 17, 2010 5:25 PM     Clinical Lists Changes  Orders: Added new Service order of Admin 1st Vaccine ([address]) - Signed Added new Service order of Flu Vaccine 89yrs + [phone]) - Signed Observations: Added new observation of FLU VAX VIS: 02/21/10 version (05/17/2010 17:24) Added new observation of FLU VAXLOT: [id number] (05/17/2010 17:24) Added new observation of FLU VAXMFR: [employer] (05/17/2010 17:24) Added new observation of FLU VAX EXP: 01/27/2011 (05/17/2010 17:24) Added new observation of FLU VAX DSE: 0.53ml (05/17/2010 17:24) Added new observation of FLU VAX: Fluvax 3+ (05/17/2010 17:24)

## 2010-08-31 NOTE — Progress Notes (Signed)
Summary: allergy shots  Phone Note Call from Patient Call back at Home Phone [phone]   Caller: Patient Call For: young Summary of Call: pt wants to know if [facility] will cover her allergy shots.  Initial call taken by: Tivis Ringer, CNA,  Nov 30, 2009 4:58 PM  Follow-up for Phone Call        I spoke with the patient & let her know that [facility] does not cover allergy shots.  I am going to get her a total cost with a cash payment discount so she will know what to do from here.  Phone Call Completed Follow-up by: Lorenza Evangelist,  Dec 06, 2009 12:14 PM

## 2010-08-31 NOTE — Miscellaneous (Signed)
Summary: Discharge Summary for [facility]  Discharge Summary for [facility]   Imported By: Sherian Rein 09/06/2009 11:22:58  _____________________________________________________________________  External Attachment:    Type:   Image     Comment:   External Document

## 2010-08-31 NOTE — Progress Notes (Signed)
Summary: shot  Phone Note Call from Patient   Caller: Patient Call For: young Summary of Call: calling about getting steroid shot this week Initial call taken by: Rickard Patience,  November 23, 2009 11:54 AM  Follow-up for Phone Call        Pt states she is schedueld for skin test appt on 11/30/09. She staets she is aware she is supposed to stop meds three days before, but she states she is taking benadryl every 4-6 hours and loratadine daily and is still ahving alot of allergy symptoms. [facility] estates CY mentioned something about if she didn't feel like she could go without meds x 3 days then it was possible for him to give her a steroid shot. Please advise. Carron Curie CMA  November 23, 2009 1:12 PM allergies: wellbutrin, celexa  Additional Follow-up for Phone Call Additional follow up Details #1::        I suggest we send her prednisone 10 mg, # 7. Have her take one daily, starting a week before she comes for skin testing. She is to stop all antihistamine products 3 days before testing. Additional Follow-up by: Waymon Budge MD,  November 23, 2009 4:45 PM    Additional Follow-up for Phone Call Additional follow up Details #2::    Called, spoke with pt.  Pt informed of above recs per CY and aware pred rx sent to [facility].  She verbalized understanding.   Follow-up by: Gweneth Dimitri RN,  November 23, 2009 4:52 PM  New/Updated Medications: PREDNISONE 10 MG TABS (PREDNISONE) take 1 tablet by mouth once daily Prescriptions: PREDNISONE 10 MG TABS (PREDNISONE) take 1 tablet by mouth once daily  #7 x 0   Entered by:   Gweneth Dimitri RN   Authorized by:   Waymon Budge MD   Signed by:   Gweneth Dimitri RN on 11/23/2009   Method used:   Electronically to        [facility] (retail)       [address]       [place], [place]  [address]       Ph: [phone]       Fax: [phone]   RxID:   [id number]

## 2010-09-07 ENCOUNTER — Inpatient Hospital Stay (INDEPENDENT_AMBULATORY_CARE_PROVIDER_SITE_OTHER)
Admission: RE | Admit: 2010-09-07 | Discharge: 2010-09-07 | Disposition: A | Discharge status: Home / Self Care | Payer: Self-pay | Source: Ambulatory Visit | Attending: Family Medicine | Admitting: Family Medicine

## 2010-09-07 DIAGNOSIS — R42 Dizziness and giddiness: Secondary | ICD-10-CM

## 2010-09-07 DIAGNOSIS — R071 Chest pain on breathing: Secondary | ICD-10-CM

## 2010-09-07 LAB — POCT I-STAT, CHEM 8
BUN: 10 mg/dL (ref 6–23)
Calcium, Ion: 1.25 mmol/L (ref 1.12–1.32)
Chloride: 108 mEq/L (ref 96–112)
Creatinine, Ser: 0.8 mg/dL (ref 0.4–1.2)
Glucose, Bld: 100 mg/dL — ABNORMAL HIGH (ref 70–99)

## 2010-10-13 LAB — BASIC METABOLIC PANEL
BUN: 5 mg/dL — ABNORMAL LOW (ref 6–23)
CO2: 22 mEq/L (ref 19–32)
Chloride: 113 mEq/L — ABNORMAL HIGH (ref 96–112)
Creatinine, Ser: 0.65 mg/dL (ref 0.4–1.2)
Glucose, Bld: 77 mg/dL (ref 70–99)

## 2010-10-13 LAB — POCT CARDIAC MARKERS

## 2010-10-13 LAB — CBC
MCH: 27.9 pg (ref 26.0–34.0)
MCHC: 33.3 g/dL (ref 30.0–36.0)
MCV: 83.8 fL (ref 78.0–100.0)
Platelets: 261 10*3/uL (ref 150–400)
RBC: 4.44 MIL/uL (ref 3.87–5.11)

## 2010-10-13 LAB — DIFFERENTIAL
Eosinophils Absolute: 0.1 10*3/uL (ref 0.0–0.7)
Eosinophils Relative: 1 % (ref 0–5)
Lymphs Abs: 1.3 10*3/uL (ref 0.7–4.0)

## 2010-10-15 LAB — URINALYSIS, ROUTINE W REFLEX MICROSCOPIC
Leukocytes, UA: NEGATIVE
Protein, ur: NEGATIVE mg/dL
Urobilinogen, UA: 0.2 mg/dL (ref 0.0–1.0)

## 2010-10-15 LAB — POCT I-STAT, CHEM 8
BUN: 4 mg/dL — ABNORMAL LOW (ref 6–23)
Creatinine, Ser: 0.6 mg/dL (ref 0.4–1.2)
Potassium: 4 mEq/L (ref 3.5–5.1)
Sodium: 141 mEq/L (ref 135–145)
TCO2: 27 mmol/L (ref 0–100)

## 2010-10-15 LAB — CBC
Hemoglobin: 12.2 g/dL (ref 12.0–15.0)
MCHC: 34.2 g/dL (ref 30.0–36.0)
Platelets: 287 10*3/uL (ref 150–400)
RDW: 14.4 % (ref 11.5–15.5)

## 2010-10-15 LAB — URINE MICROSCOPIC-ADD ON

## 2010-10-15 LAB — POCT CARDIAC MARKERS
CKMB, poc: <1 ng/mL — ABNORMAL LOW (ref 1.0–8.0)
Myoglobin, poc: 34.2 ng/mL (ref 12–200)

## 2010-10-16 LAB — POCT I-STAT, CHEM 8
Glucose, Bld: 86 mg/dL (ref 70–99)
HCT: 40 % (ref 36.0–46.0)
Hemoglobin: 13.6 g/dL (ref 12.0–15.0)
Potassium: 4.6 mEq/L (ref 3.5–5.1)
Sodium: 141 mEq/L (ref 135–145)

## 2010-10-24 ENCOUNTER — Inpatient Hospital Stay (INDEPENDENT_AMBULATORY_CARE_PROVIDER_SITE_OTHER)
Admission: RE | Admit: 2010-10-24 | Discharge: 2010-10-24 | Disposition: A | Discharge status: Home / Self Care | Payer: Self-pay | Source: Ambulatory Visit | Attending: Family Medicine | Admitting: Family Medicine

## 2010-10-24 DIAGNOSIS — R51 Headache: Secondary | ICD-10-CM

## 2010-10-24 DIAGNOSIS — J019 Acute sinusitis, unspecified: Secondary | ICD-10-CM

## 2010-10-24 LAB — POCT I-STAT, CHEM 8
Glucose, Bld: 80 mg/dL (ref 70–99)
HCT: 35 % — ABNORMAL LOW (ref 36.0–46.0)
Hemoglobin: 11.9 g/dL — ABNORMAL LOW (ref 12.0–15.0)
Potassium: 4.4 mEq/L (ref 3.5–5.1)

## 2010-11-02 LAB — BASIC METABOLIC PANEL
BUN: 2 mg/dL — ABNORMAL LOW (ref 6–23)
CO2: 27 mEq/L (ref 19–32)
Chloride: 107 mEq/L (ref 96–112)
Creatinine, Ser: 0.54 mg/dL (ref 0.4–1.2)
GFR calc Af Amer: >60 mL/min (ref >60)
Potassium: 3.8 mEq/L (ref 3.5–5.1)

## 2010-11-02 LAB — URINE MICROSCOPIC-ADD ON

## 2010-11-02 LAB — WET PREP, GENITAL
Clue Cells Wet Prep HPF POC: NONE SEEN
Trich, Wet Prep: NONE SEEN
Yeast Wet Prep HPF POC: NONE SEEN

## 2010-11-02 LAB — URINALYSIS, ROUTINE W REFLEX MICROSCOPIC
Glucose, UA: NEGATIVE mg/dL
Glucose, UA: NEGATIVE mg/dL
Hgb urine dipstick: NEGATIVE
Ketones, ur: 15 mg/dL — AB
Ketones, ur: >80 mg/dL — AB
Protein, ur: NEGATIVE mg/dL
Protein, ur: NEGATIVE mg/dL
Urobilinogen, UA: 0.2 mg/dL (ref 0.0–1.0)

## 2010-11-02 LAB — CBC
HCT: 36.3 % (ref 36.0–46.0)
MCHC: 32.7 g/dL (ref 30.0–36.0)
MCHC: 33.2 g/dL (ref 30.0–36.0)
MCV: 78.1 fL (ref 78.0–100.0)
RBC: 4.64 MIL/uL (ref 3.87–5.11)
RDW: 14.8 % (ref 11.5–15.5)

## 2010-11-02 LAB — DIFFERENTIAL
Basophils Absolute: 0 10*3/uL (ref 0.0–0.1)
Basophils Relative: 0 % (ref 0–1)
Basophils Relative: 0 % (ref 0–1)
Eosinophils Absolute: 0 10*3/uL (ref 0.0–0.7)
Eosinophils Relative: 1 % (ref 0–5)
Monocytes Relative: 5 % (ref 3–12)
Neutro Abs: 5.4 10*3/uL (ref 1.7–7.7)
Neutrophils Relative %: 78 % — ABNORMAL HIGH (ref 43–77)
Neutrophils Relative %: 83 % — ABNORMAL HIGH (ref 43–77)

## 2010-11-02 LAB — URINE CULTURE: Colony Count: 30000

## 2010-11-03 LAB — URINALYSIS, ROUTINE W REFLEX MICROSCOPIC
Glucose, UA: NEGATIVE mg/dL
Ketones, ur: NEGATIVE mg/dL
pH: 6 (ref 5.0–8.0)

## 2010-11-03 LAB — CULTURE, ROUTINE-ABSCESS

## 2010-11-03 LAB — POCT I-STAT, CHEM 8
BUN: 11 mg/dL (ref 6–23)
Calcium, Ion: 1.12 mmol/L (ref 1.12–1.32)
Chloride: 108 mEq/L (ref 96–112)

## 2010-11-03 LAB — CBC
MCHC: 33.7 g/dL (ref 30.0–36.0)
Platelets: 291 10*3/uL (ref 150–400)
RDW: 15.7 % — ABNORMAL HIGH (ref 11.5–15.5)

## 2010-11-03 LAB — DIFFERENTIAL
Eosinophils Absolute: 0.1 10*3/uL (ref 0.0–0.7)
Eosinophils Relative: 1 % (ref 0–5)
Lymphocytes Relative: 12 % (ref 12–46)
Monocytes Absolute: 0.5 10*3/uL (ref 0.1–1.0)
Neutro Abs: 6.4 10*3/uL (ref 1.7–7.7)

## 2010-11-03 LAB — POCT PREGNANCY, URINE: Preg Test, Ur: NEGATIVE

## 2010-11-05 LAB — POCT CARDIAC MARKERS
CKMB, poc: <1 ng/mL — ABNORMAL LOW (ref 1.0–8.0)
Myoglobin, poc: 36.1 ng/mL (ref 12–200)
Myoglobin, poc: 61 ng/mL (ref 12–200)
Troponin i, poc: <0.05 ng/mL (ref 0.00–0.09)

## 2010-11-05 LAB — CARDIAC PANEL(CRET KIN+CKTOT+MB+TROPI)
CK, MB: 0.4 ng/mL (ref 0.3–4.0)
CK, MB: 0.4 ng/mL (ref 0.3–4.0)
Total CK: 27 U/L (ref 7–177)
Troponin I: 0.01 ng/mL (ref 0.00–0.06)

## 2010-11-05 LAB — CBC
HCT: 35.9 % — ABNORMAL LOW (ref 36.0–46.0)
Platelets: 351 10*3/uL (ref 150–400)
WBC: 8.9 10*3/uL (ref 4.0–10.5)

## 2010-11-05 LAB — BASIC METABOLIC PANEL
BUN: 4 mg/dL — ABNORMAL LOW (ref 6–23)
CO2: 27 mEq/L (ref 19–32)
Chloride: 108 mEq/L (ref 96–112)
Potassium: 3.8 mEq/L (ref 3.5–5.1)

## 2010-11-05 LAB — GLUCOSE, CAPILLARY: Glucose-Capillary: 88 mg/dL (ref 70–99)

## 2010-11-05 LAB — POCT I-STAT, CHEM 8
BUN: <3 mg/dL — ABNORMAL LOW (ref 6–23)
Calcium, Ion: 1.2 mmol/L (ref 1.12–1.32)
Creatinine, Ser: 0.6 mg/dL (ref 0.4–1.2)
Glucose, Bld: 115 mg/dL — ABNORMAL HIGH (ref 70–99)
TCO2: 21 mmol/L (ref 0–100)

## 2010-11-05 LAB — D-DIMER, QUANTITATIVE: D-Dimer, Quant: 0.76 ug/mL-FEU — ABNORMAL HIGH (ref 0.00–0.48)

## 2010-11-05 LAB — CK TOTAL AND CKMB (NOT AT ARMC): Relative Index: INVALID (ref 0.0–2.5)

## 2010-11-05 LAB — LIPID PANEL
Cholesterol: 126 mg/dL (ref 0–200)
HDL: 48 mg/dL (ref >39)
Triglycerides: 62 mg/dL (ref <150)

## 2010-11-06 LAB — DIFFERENTIAL
Basophils Absolute: 0 10*3/uL (ref 0.0–0.1)
Lymphocytes Relative: 10 % — ABNORMAL LOW (ref 12–46)
Monocytes Absolute: 0.3 10*3/uL (ref 0.1–1.0)
Neutro Abs: 7.2 10*3/uL (ref 1.7–7.7)
Neutrophils Relative %: 85 % — ABNORMAL HIGH (ref 43–77)

## 2010-11-06 LAB — POCT I-STAT, CHEM 8
Calcium, Ion: 1.15 mmol/L (ref 1.12–1.32)
Chloride: 107 mEq/L (ref 96–112)
Chloride: 110 mEq/L (ref 96–112)
Glucose, Bld: 100 mg/dL — ABNORMAL HIGH (ref 70–99)
HCT: 36 % (ref 36.0–46.0)
HCT: 35 % — ABNORMAL LOW (ref 36.0–46.0)
Hemoglobin: 11.9 g/dL — ABNORMAL LOW (ref 12.0–15.0)
Potassium: 3.1 mEq/L — ABNORMAL LOW (ref 3.5–5.1)
Potassium: 4.1 mEq/L (ref 3.5–5.1)
Sodium: 143 mEq/L (ref 135–145)
Sodium: 141 mEq/L (ref 135–145)
TCO2: 24 mmol/L (ref 0–100)

## 2010-11-06 LAB — CBC
Hemoglobin: 11.7 g/dL — ABNORMAL LOW (ref 12.0–15.0)
RBC: 4.1 MIL/uL (ref 3.87–5.11)
RDW: 15 % (ref 11.5–15.5)

## 2010-11-06 LAB — POCT I-STAT 3, VENOUS BLOOD GAS (G3P V)
pCO2, Ven: 31.3 mmHg — ABNORMAL LOW (ref 45.0–50.0)
pH, Ven: 7.446 — ABNORMAL HIGH (ref 7.250–7.300)

## 2010-11-06 LAB — BRAIN NATRIURETIC PEPTIDE: Pro B Natriuretic peptide (BNP): 63 pg/mL (ref 0.0–100.0)

## 2010-11-06 LAB — LITHIUM LEVEL: Lithium Lvl: 0.57 mEq/L — ABNORMAL LOW (ref 0.80–1.40)

## 2010-11-08 ENCOUNTER — Other Ambulatory Visit (HOSPITAL_COMMUNITY): Payer: Self-pay | Admitting: Family Medicine

## 2010-11-08 ENCOUNTER — Other Ambulatory Visit (HOSPITAL_COMMUNITY): Payer: Self-pay

## 2010-11-08 DIAGNOSIS — E049 Nontoxic goiter, unspecified: Secondary | ICD-10-CM

## 2010-11-13 ENCOUNTER — Ambulatory Visit (HOSPITAL_COMMUNITY)
Admission: RE | Admit: 2010-11-13 | Discharge: 2010-11-13 | Disposition: A | Discharge status: Home / Self Care | Payer: Self-pay | Source: Ambulatory Visit | Attending: Family Medicine | Admitting: Family Medicine

## 2010-11-13 DIAGNOSIS — E0789 Other specified disorders of thyroid: Secondary | ICD-10-CM | POA: Insufficient documentation

## 2010-11-13 DIAGNOSIS — E049 Nontoxic goiter, unspecified: Secondary | ICD-10-CM

## 2010-12-12 NOTE — H&P (Signed)
Stacey [name], Stacey [name] NO.:  [id number]   MEDICAL RECORD NO.:  [id number]          PATIENT TYPE:  INP   LOCATION:  1827                         FACILITY:  [facility]   PHYSICIAN:  Pearlean Brownie, M.D.DATE OF BIRTH:  Sep 08, 1965   DATE OF ADMISSION:  02/24/2009  DATE OF DISCHARGE:                              HISTORY & PHYSICAL   PRIMARY CARE PHYSICIAN:  [facility], Maurice March, MD   CHIEF COMPLAINT:  Chest pain.   HISTORY OF PRESENT ILLNESS:  This is a 45 year old female with chest  pain, started this morning.  The patient described as a pressure on the  left side of her chest that radiated to her back.  Pain lasted for  hours, only relieved at about 3 p.m. while the patient was in the ER  when she received 1 sublingual nitroglycerin, rated the pain as 5-6/10.  After first nitroglycerin, pain was 0/10.  Currently has Nitropaste on,  and the patient states she is still getting some muscle spasm and pain  in her chest.  Rates the pain is minimal.  The patient also had some  shortness of breath with her chest pain earlier today.  She stated she  felt she could not catch her breath.  These all did start at rest while  she was on the computer.  The patient had no nausea or vomiting, no  sweating.  The patient does see Dr. Marchelle Gearing at [facility] and  contacted his office during her episode this morning.  The patient was  told she could be worked in his office and see later today.  The patient  felt she could not wait and drove herself to the ER.  Pulmonology workup  so far per the patient has been negative.  She was scheduled for a  stress test on Monday.  In the ER, the patient received sublingual  nitroglycerin  x1, aspirin 324 mg, albuterol nebs, Atrovent nebs,  Ativan, and she is currently still on her Nitropaste.   ALLERGIES:  ASMANEX TWISTHALER and WELLBUTRIN.   MEDICATIONS:  1. Albuterol inhaler.  2. Benadryl 50 mg p.o. daily.  3.  Clonazepam 0.5 mg p.o. daily p.r.n. anxiety.  4. Lorazepam 1 mg p.o. q.4 h. p.r.n. anxiety.   PAST MEDICAL HISTORY:  Asthma, back pain which is chronic, bipolar  disorder, morbid obesity, posttraumatic stress disorder, and sleep  apnea.   PAST SURGICAL HISTORY:  Significant only for BTL.   SOCIAL HISTORY:  Lives with her husband and 5 sons and 1 daughter.  The  patient does not work currently.  Tobacco use.  She quit smoking about 2-  1/2 years ago.  Previous to that describes her smoking was very  intermittent and not heavy. Denies alcohol and denies drugs.   FAMILY HISTORY:  Mother, diabetes and hypertension.  Father, diabetes,  hypertension, and CAD.  Siblings, none.   REVIEW OF SYSTEMS:  Please see HPI for complete review of systems.  The  patient admits to chest pain, dyspnea, and wheezing.  Denies any other  problems.   PHYSICAL EXAMINATION:  VITAL SIGNS:  Temperature 98.3, pulse  108,  respirations 16, blood pressure 146/100, 98% on room air, weight 345  pounds.  GENERAL:  Pleasant, middle-aged, obese female in no acute distress.  A  and O x3.  HEAD:  Atraumatic and normocephalic.  EYES:  EOMI.  PERRLA.  EARS:  Clear.  No bulging of tympanic membrane or erythema noted.  MOUTH:  Moist oral mucosa.  NECK:  No JVD.  Supple.  Normal range of motion.  No masses.  CV:  Regular rate and rhythm.  No rubs, murmurs, or gallops.  LUNGS:  Clear to bilateral auscultation.  Normal respiratory effort.  ABDOMEN:  Positive bowel sounds x4.  Nontender and soft.  No masses or  organomegaly.  EXTREMITIES:  No clubbing or cyanosis.  +1 pitting edema bilaterally on  the lower extremities.  NEURO:  Cranial nerves II through XII are intact.   LABORATORY AND STUDIES:  Sodium of 140, potassium 4.4, BUN 3, creatinine  0.6, and glucose 115.  Hemoglobin 12.9 and hematocrit 38.0.  Point-of-  care enzymes and cardiac enzymes done in the ER were negative x2.  D-  dimer was 0.76.  CTA was done  subsequent to the high D-dimer showed no  pulmonary embolism, did show a thyroid nodule and a fatty liver.  Chest  x-ray showed no acute or active processes.  EKG was in normal sinus  rhythm at a rate of 73 beats per minute.   ASSESSMENT AND PLAN:  A 45 year old female admitted with chest pain.  1. Chest pain.  Point-of-care enzymes negative x2 in the ER.  We will      cycle her enzymes x3 overnight and get a repeat EKG in the a.m.      TSH and FLP.  We will monitor her blood pressure.  We will start      metoprolol 25 mg p.o. b.i.d. with the first dose now.  We will      discontinue her Nitropaste.  Chest pain is currently resolved.  If      the pain returns, we will start her nitroglycerin sublingual and      continue to monitor.  2. Bipolar and posttraumatic stress syndrome.  On p.r.n. clonazepam      and p.r.n. lorazepam for anxiety and stress.  We will continue      these p.r.n.  Not on any baseline meds for bipolar, supposed to      follow up with [facility], has not done so yet.  3. Shortness of breath.  We will continue with albuterol inhaler      p.r.n. for shortness of breath and continue her home Benadryl.  4. Prophylaxis heparin 5000 units subcu q.8 h. and PPI.  5. FEN/GI:  Heart healthy diet, hep-lock, IV fluids.  6. Disposition.  Pending resolution of symptoms.      Alvia Grove, DO  Electronically Signed      Pearlean Brownie, M.D.  Electronically Signed    BB/MEDQ  D:  02/24/2009  T:  02/25/2009  Job:  [id number]

## 2010-12-12 NOTE — Op Note (Signed)
Stacey [name], BYE              ACCOUNT NO.:  [id number]   MEDICAL RECORD NO.:  [id number]          PATIENT TYPE:  AMB   LOCATION:  CARD                         FACILITY:  [facility]   PHYSICIAN:  Kalman Shan, MD   DATE OF BIRTH:  April 22, 1966   DATE OF PROCEDURE:  03/23/2009  DATE OF DISCHARGE:                               OPERATIVE REPORT   TYPE OF PROCEDURE:  Laryngoscopy.   INDICATION:  Rule out vocal cord dysfunction.   PREPROCEDURE ASSESSMENT:  Low risk procedure with just topical lidocaine  anesthesia.  Obese female.  No clear cause for dyspnea and asthma  symptoms.  Methacholine challenge test negative.  Exercise induced  bronchospasm test negative.  Cardiopulmonary stress test shows only  obesity as cause of dyspnea.   PREPROCEDURE VITAL SIGNS:  Height 72 inches, weight 343 pounds, blood  pressure 134/79, pulse of 81, respiratory rate of 25.  ASA class I-II.  Airway assessment class is II.   PHYSICAL EXAM:  Is within normal limits other than the fact she is  obese.   PROCEDURE NOTE:  Her left nostril was numbed with just topical lidocaine  gel and the bronchoscope was introduced through the nose and then  positioned just above the epiglottis to have a clear look at the vocal  cords.  No other anesthesia was administered.  Then under quiet  conditions normal respiratory motion of the vocal cords was observed and  this was normal.  The epiglottis also looked normal.  The vocal cord  also looked normal.  After this exposure was done with various agents  that are supposed to trigger her asthma symptoms.  She was initially  negative for bleach and cigarette smoke but subsequently had positive  vocal cord dysfunction for Spray and Wash, Mr. Clean and perfume.  She  was then again negative for nail polish and nail polish remover.  After  this the procedure was terminated.   IMPRESSION:  Vocal cord dysfunction for select agents such as Spray and  Wash. Mr. Brigid Re and  perfume.   POSTPROCEDURE PLAN:  Will refer to speech therapy.  No other  restrictions postprocedure.      Kalman Shan, MD  Electronically Signed     MR/MEDQ  D:  03/23/2009  T:  03/23/2009  Job:  [id number]

## 2010-12-12 NOTE — Procedures (Signed)
Stacey [name], Stacey [name] NO.:  [id number]   MEDICAL RECORD NO.:  [id number]          PATIENT TYPE:  OUT   LOCATION:  SLEEP CENTER                 FACILITY:  [facility]   PHYSICIAN:  Oretha Milch, MD      DATE OF BIRTH:  Dec 03, 1965   DATE OF STUDY:  01/18/2009                            NOCTURNAL POLYSOMNOGRAM   REFERRING PHYSICIAN:   INDICATION FOR THE STUDY:  Excessive daytime somnolence, loud snoring,  morning headaches, and non-restorative sleep in this morbidly obese  woman with asthma.  At the time of the study, her height was 5 feet 11  inches, weight 360 pounds, BMI 50, and neck size 18 inches.   EPWORTH SLEEPINESS SCORE:  Epworth sleepiness score was 12.   BEDTIME MEDICATION:  Lithium carbonate for bipolar disorder.   This overnight polysomnogram was performed with a sleep technologist in  attendance.  EEG, EOG, EMG, EKG, and respiratory data were recorded.  Sleep stages, arousals, limb movements, and respiratory parameters were  scored according to criteria laid out by the American Academy of Sleep  Medicine.   SLEEP ARCHITECTURE:  Lights out was at 10:27 p.m., lights on was at 5:17  a.m.  Total sleep time was 256 minutes with a sleep period time of 380  minutes with a sleep efficiency of 63%.  Sleep latency was 29 minutes  and latency to REM sleep was 18 minutes.  REM sleep was noted on 1 brief  around 4:00 a.m., she had long periods of awakening during the study.  Sleep stages of the percentage of total sleep time was N1 7%, N2 54%,  and N3 30%, and REM sleep 8.8% (22.5 minutes).  She has spent 126  minutes in the supine position and 22 minutes in the supine REM  position.   AROUSAL DATA:  There were a total of 111 arousals with an arousal index  of 26 events per hour, of these 76 were spontaneous and 2 were  associated with limb movements, and the rest were associated with  respiratory events.   RESPIRATORY DATA:  There were a total of 5 obstructive  apnea, 0 central  apnea, 0 mixed apneas, and 75 hypopneas with an apnea-hypopnea index of  18.7 events per hour, 9 respiratory effort related arousals were noted  with an RDI (respiratory disturbance index) of 20.8 events per hour.  Longest hypopnea was 32-second.  The REM related AHI was 69 events per  hour.   OXYGEN SATURATION DATA:  The lowest desaturation was 82% during non-REM  sleep.  Desaturation index was 25 events per hour.  She spent 12 minutes  with a saturation less than 88%.   CARDIAC DATA:  The low heart rate was 54 beats per minute during non-REM  sleep.  No arrhythmias were noted.   LIMB MOVEMENT DATA:  Limb movement arousal index was 0.5 events per  hour.   DISCUSSION:  She was desensitized with a medium ResMed Mirage Quattro  mask prior to the study.  However, there were not enough respiratory  events in the first half to perform a CPAP titration.   IMPRESSION:  1. Moderate degree of sleep disordered  breathing with predominant      hypopneas causing sleep fragmentation and oxygen desaturation.  2. This appeared to be more severe during REM sleep, but was not      related to body position.  3. No evidence of cardiac arrhythmias, limb movements, or behavioral      disturbance during sleep.   RECOMMENDATIONS:  1. Treatment options for this degree of sleep-disordered breathing      include CPAP therapy and weight loss.  2. She should be asked to avoid medications with sedative side      effects.  3. She should be cautioned against driving when sleepy.      Oretha Milch, MD  Electronically Signed     RVA/MEDQ  D:  01/18/2009 13:02:46  T:  01/19/2009 01:25:57  Job:  [id number]   cc:   Kalman Shan, MD  [address] [employer] [address]  [place] [address]

## 2010-12-12 NOTE — Discharge Summary (Signed)
NAMELEIA, COLETTI NO.:  [id number]   MEDICAL RECORD NO.:  [id number]          PATIENT TYPE:  INP   LOCATION:  3733                         FACILITY:  [facility]   PHYSICIAN:  Paula Compton, MD        DATE OF BIRTH:  [birth year]   DATE OF ADMISSION:  02/24/2009  DATE OF DISCHARGE:  02/25/2009                               DISCHARGE SUMMARY   PRIMARY CARE Orine Goga:  Maurice March, MD   DISCHARGE DIAGNOSES:  1. Chest pain.  2. Bipolar and post-traumatic stress syndrome.  3. Shortness of breath.   DISCHARGE MEDICATIONS:  1. Albuterol inhaler.  2. Benadryl 50 mg p.o. daily p.r.n.  3. Clonazepam 0.5 mg p.o. daily p.r.n. anxiety.  4. Lorazepam 1 mg p.o. q.4 h. p.r.n. anxiety.   PROCEDURES:  1. EKG which showed normal sinus rhythm at a rate of 73 beats per      minute.  Repeat EKG done today showed a normal sinus rhythm at a      rate of 71 beats per minute.  2. A CTA was done in the ER due to her elevated D-dimer which showed      no acute thoracic findings.  3. Nodules extending posteriorly from the left thyroid lobe measuring      1.6 cm in diameter.  The patient is on outpatient followup on this.  4. Diffuse hepatic steatosis was also found.  No pulmonary embolisms      were seen on CTA.   LABORATORY DATA:  In the ER, point of care enzymes were negative x2.  Cardiac enzymes were negative x3.  Lipid panel was drawn showed a  cholesterol of 126, triglycerides of 62, HDL 48, LDL 66.  BMP with  sodium of 140, potassium of 3.8, chloride 108, CO2 27, BUN 4, creatinine  0.57, glucose of 90.  CBC white count of 8.9, hemoglobin 11.9,  hematocrit 35.9, platelets 361.  D-dimer was elevated at 0.76.   BRIEF HOSPITAL COURSE:  1. This is a 45 year old female who presented to the ER yesterday      started with some atypical chest pain started that morning.  The      patient started with pressure on the left side of her chest that      radiated to her back.  Chest  pain was relieved.  In the ER, the      patient received one dose of nitroglycerin.  Cardiac enzymes were      negative x2.  Point of care enzymes were cycled and once the      patient was on the floor they were negative x3.  EKG in the ER with      normal sinus rhythm, repeat EKG done on the floor showed a normal      sinus rhythm.  Fasting lipid panels were drawn and results were as      above.  The patient does not have a TSH pending.  Currently, the      patient is resolved of her chest pain, the chest pain actually  resolved yesterday in the ER, chest pain has not returned at all.      The patient has no associated symptoms of nausea, vomiting, no      sweating and shortness of breath.  2. Bipolar post-traumatic stress syndrome.  The patient was continued      on her p.r.n. clonazepam and p.r.n. lorazepam for anxiety and      stress.  She does not have any baseline medicines for this disorder      and has been told repeatedly to follow up to get a psychiatrist at      [facility] and the patient has not yet done, so the patient      was encouraged to do this.  Once again on her hospital admission,      the patient was given phone numbers many times and at this point      she needs to follow up with that as she desires.  3. Shortness of breath.  The patient is followed by Dr. Marchelle Gearing at      [facility].  Workup thus far has been negative for any      kind of findings.  The patient does state that she has trouble      breathing when the ozone is bad.  She does have some concern for      breathing later on today, currently, she is stable and she is      sating at 100% on room air.  On exam, lung sounds are clear.  The      patient is not showing any accessory use of muscles or any      increasing work to breathe.  4. Thyroid nodule.  This was an incidental finding on the CT scan.      Discussed this with the patient.  She is to follow this up with Dr.      Audria Nine,  her primary care doctor.   DISCHARGE INSTRUCTIONS:  1. The patient was instructed that if she has any return of chest      pain, she is to return to the ED as soon as possible.  2. Pending result issues to be followed up after discharge.  Thyroid      nodule was found incidentally on the CTA and needs to be followed      up with PCP.  TSH is pending that needs to be followed up as well.   FOLLOWUP APPOINTMENTS:  1. The patient has a stress test scheduled by Dr. Marchelle Gearing here at      [facility] on Monday, February 28, 2009.  The patient was instructed      to follow up with this.  2. An appointment was made with Dr. Marchelle Gearing at [facility]      for March 04, 2009 at 4:30 p.m.  3. The patient already has an appointment with Dr. Vassie Loll, sleep doctor      at [facility] March 03, 2009 at 1:45 p.m.   DISCHARGE CONDITION:  The patient was discharged to home in stable  medical condition.      Alvia Grove, DO  Electronically Signed      Paula Compton, MD  Electronically Signed    BB/MEDQ  D:  02/25/2009  T:  02/26/2009  Job:  [id number]   cc:   Kalman Shan, MD  Maurice March, M.D.

## 2010-12-12 NOTE — Assessment & Plan Note (Signed)
[facility]                                 ON-CALL NOTE   NAME:[name], Stacey BORMAN                     MRN:          [id number]  DATE:02/17/2009                            DOB:          August 12, 1965    Ms. [name] is a patient of Dr. Marchelle Gearing.  She call this evening saying  that when she stepped outside, she had tightness and burning in her  chest and is similar to previous episodes for which she reported to the  emergency room throughout the last 2 months.  She has been told to try  Benadryl and Ventolin, and has also been given prescriptions for  prednisone to try on an acute basis.  She claims that this episode is no  worse than the prior episodes that she is reported to the emergency  room, and she usually gets good benefit from prednisone shortly after  taking it.  I asked that she tried 40 mg of prednisone now, if she does  not have significant improvement over the next several hours or she has  any worsening, she should report to the emergency room immediately.  Otherwise, she should call Dr. Marchelle Gearing in his office tomorrow for  further instructions.  At any time, she can call back here or report to  the emergency room.  During this interview, she talked in complete  sentences and had no evidence of respiratory distress over the phone.     Rogelia Rohrer, MD  Electronically Signed    Clemetine Marker  DD: 02/17/2009  DT: 02/18/2009  Job #: [id number]

## 2010-12-15 NOTE — Op Note (Signed)
NAME:  Stacey [name], Stacey [name] NO.:  [id number]   MEDICAL RECORD NO.:  [id number]                   PATIENT TYPE:  AMB   LOCATION:  [facility]                                 FACILITY:  [facility]   PHYSICIAN:  Phil D. Okey Dupre, M.D.                  DATE OF BIRTH:  [birth year]   DATE OF PROCEDURE:  11/12/2002  DATE OF DISCHARGE:                                 OPERATIVE REPORT   PROCEDURES:  1. Low flat cesarean section.  2. Bilateral tubal ligation.   PREOPERATIVE DIAGNOSES:  1. Breech presentation at labor.  2. Previous cesarean section.   POSTOPERATIVE DIAGNOSES:  1. Breech presentation at labor.  2. Previous cesarean section.  3. Double footling breech.   SURGEON:  Javier Glazier. Okey Dupre, M.D.   FIRST ASSISTANT:  Dr. Milas Gain.   ANESTHESIA:  Spinal.   PROCEDURE:  Under satisfactory spinal anesthesia with the patient in the  dorsal supine position, a Foley catheter in the urinary bladder, the abdomen  was prepped and draped in the usual sterile manner.  We entered through a  previous surgical scar, the skin incision situated 7 cm above the symphysis  pubis and extended for a total length of 20 cm.  The abdomen was entered by  layers and entered into the peritoneal cavity.  The visceral peritoneum and  the viscera of the uterus was opened transversely with sharp dissection, the  bladder pushed away from the lower uterine segment.  It was entered by sharp  and blunt dissection, and the baby was in a RST presentation.  The right  foot was grasped, which immediately brought the left foot into view.  Both  feet were then grasped around the heels and the baby was easily delivered  down to the shoulders, which were delivered in a typical breech extraction  and the head was then easily delivered.  The cord doubly clamped and divided  and the baby handed to the pediatrician.  The placenta manually removed, and  the uterus explored.  The uterus was then closed with a continuous  running  locked 0 Vicryl on an atraumatic needle.  Once this was done, the fallopian  tube on the left was grasped in the midportion.  A hemostat placed around an  avascular portion of the mesosalpinx beneath the tube and one plain suture  drawn through this and tied around the distal and proximal ends of the tube,  forming a 2 cm loop above the tie.  A second tie placed just below the  aforementioned tie.  The section of the tube above the tie was excised and  the ends of the tubes remaining were coagulated with hot cautery.  Attention  was paid to the area if there was bleeding and none was noted.  Attention  directed to the right fallopian tube, which was bound down with adhesions,  so that there was no real free area  of the tube with the exception of the  fimbriated end.  A hemostat was placed across the fimbriated end of the tube  for about the last 2 cm, and the section of tube, the fimbria, was excised.  The portion of tube held by the hemostat was then tied with a 1 plain catgut  suture and a second tie placed just below the aforementioned tie and so that  the end of the tube was then coagulated with hot cautery.  The area was  observed for bleeding, and none was noted.  The area of the uterine incision  was then observed.  There was a small oozing area in the midportion and a  figure-of-eight 0 Vicryl was used to control that.  There was no further  bleeding noted.  The fascia was closed with a running alternating locked 0  Vicryl suture.  A stab wound was made in the right upper area of the  incision, approximately 1 cm from the right end of the incision and 1 cm  above the incision, and through this, a Jackson-Pratt drain, #10, was drawn,  which was left in the wound.  The wound then was tied with subcutaneous  approximation carried out with 0 plain sutures.  The skin staples were used  for skin edge approximation.  A dry, sterile dressing was applied.  The  estimated total blood  loss during the procedure was 500 mL.  Tolerated the  procedure well and was sent to the recovery room in a satisfactory condition  with the Foley catheter draining clear amber urine at the end of the  procedure.  The baby was female with a 9 and 9 Apgar's.  Weight is not  available at this time.                                               Phil D. Okey Dupre, M.D.    PDR/MEDQ  D:  11/12/2002  T:  11/12/2002  Job:  [id number]

## 2010-12-15 NOTE — Discharge Summary (Signed)
NAME:  Stacey [name], GECK NO.:  [id number]   MEDICAL RECORD NO.:  [id number]                   PATIENT TYPE:  INP   LOCATION:  9113                                 FACILITY:  [facility]   PHYSICIAN:  Phil D. Okey Dupre, M.D.                  DATE OF BIRTH:  1966/06/07   DATE OF ADMISSION:  11/12/2002  DATE OF DISCHARGE:  11/15/2002                                 DISCHARGE SUMMARY   DISCHARGE DIAGNOSES:  1. Status post low transverse cesarean section.  2. Bilateral tubal ligation.  3. Breech presentation.  4. Delivery of a viable female infant.   DISCHARGE MEDICATIONS:  1. Percocet 5/325 mg one tab p.o. q.4h. p.r.n. pain.  2. Ibuprofen 600 mg one tab p.o. q.6h. p.r.n. cramps, pain.  3. Flintstones vitamins 1-2 every day while breast feeding.   WOUND CARE:  Keep the incision clean and dry and return to maternity  admissions on Tuesday April 20 for removal of staples.   DIET:  Regular.   ACTIVITY:  1. No heavy lifting x 4 weeks.  2. Nothing in the vagina for six weeks.   FOLLOW UP:  1. Followup at [facility] in six weeks.  2. Patient was also instructed to return to maternity admissions at [facility] if her headache did not resolve or worsened, if she developed     blurring vision or spots in her vision, if she developed right sided     abdominal pain, or was otherwise concerned.   BRIEF ADMISSION HISTORY:  The patient s a 45 year old G9, P5-0-3-5 who  presented at 33 and 5 weeks to maternity admission complaining of  contractions and possible spontaneous ruptured membranes.  She reported good  fetal activity.  No vaginal bleeding.  She received health care at [facility] with onset of care at 23 and 5/7ths weeks.  Her first pregnancy in  1988 was delivered by C-section while her remaining deliveries were all  vaginal.   PRENATAL LABORATORY DATA:  Significant  for group B streptococcus positive  cultures in December 2003.  Patient had  signed a consent for bilateral tubal  ligation on September 23, 2002.   PHYSICAL EXAMINATION:  VITAL SIGNS:  Patient was afebrile.  PELVIC:  Patient was found to be 4 cm dilated.  Fetal heart tracing was  reassuring baseline in the 140s, good variability and reactivity.  Contractions were occurring every four to six minutes.   DIAGNOSTIC STUDIES:  Ultrasound showed the baby to be in breech presentation  so the patient was taken back for C-section secondary to breech presentation  at labor.   PROCEDURES:  Baby was delivered easily, viable female infant, Apgar's 8 and  9 and birth weight 8 pounds 0 ounces.  Bilateral tubal ligation was  performed.  Estimated total blood loss was 500 cc.  Patient and infant  tolerated the procedure well.  HOSPITAL COURSE:  The remainder of her hospitalization patient recovered  well.  Post-op hemoglobin was 10.6.  Patient had resumption of bowel  function by post-op day #1.  Patient did develop some headache on post-op  day #3 but denied any visual changes or right upper quadrant pain and had  blood pressures ranging from 116-144 systolic over 64-74 diastolic.  The JP  drain that had been placed during operation was removed on post-op day #3  secondary to decreased drain output.  Secondary to the patient's obesity  staples were not removed at the time of discharge.  Incision looked clean,  dry and intact without erythema or induration.  The drain site was also  clean and dry with no drainage.   DISPOSITION:  The patient was to return to maternity admission on April 20  for staple removal.  At the time of discharge was breast feeding and doing  well.  RPR was negative.     Georgina Peer, M.D.                 Phil D. Okey Dupre, M.D.    JM/MEDQ  D:  12/11/2002  T:  12/11/2002  Job:  [id number]

## 2011-01-13 ENCOUNTER — Emergency Department (HOSPITAL_COMMUNITY)
Admission: EM | Admit: 2011-01-13 | Discharge: 2011-01-13 | Disposition: A | Discharge status: Home / Self Care | Payer: Self-pay | Attending: Emergency Medicine | Admitting: Emergency Medicine

## 2011-01-13 DIAGNOSIS — R220 Localized swelling, mass and lump, head: Secondary | ICD-10-CM | POA: Insufficient documentation

## 2011-01-13 DIAGNOSIS — R000 Tachycardia, unspecified: Secondary | ICD-10-CM | POA: Insufficient documentation

## 2011-01-13 DIAGNOSIS — R0789 Other chest pain: Secondary | ICD-10-CM | POA: Insufficient documentation

## 2011-01-13 DIAGNOSIS — E669 Obesity, unspecified: Secondary | ICD-10-CM | POA: Insufficient documentation

## 2011-01-13 DIAGNOSIS — M549 Dorsalgia, unspecified: Secondary | ICD-10-CM | POA: Insufficient documentation

## 2011-01-13 DIAGNOSIS — R0609 Other forms of dyspnea: Secondary | ICD-10-CM | POA: Insufficient documentation

## 2011-01-13 DIAGNOSIS — T5491XA Toxic effect of unspecified corrosive substance, accidental (unintentional), initial encounter: Secondary | ICD-10-CM | POA: Insufficient documentation

## 2011-01-13 DIAGNOSIS — G8929 Other chronic pain: Secondary | ICD-10-CM | POA: Insufficient documentation

## 2011-01-13 DIAGNOSIS — R0989 Other specified symptoms and signs involving the circulatory and respiratory systems: Secondary | ICD-10-CM | POA: Insufficient documentation

## 2011-01-13 DIAGNOSIS — Z7982 Long term (current) use of aspirin: Secondary | ICD-10-CM | POA: Insufficient documentation

## 2011-01-13 DIAGNOSIS — Z79899 Other long term (current) drug therapy: Secondary | ICD-10-CM | POA: Insufficient documentation

## 2011-01-13 DIAGNOSIS — F319 Bipolar disorder, unspecified: Secondary | ICD-10-CM | POA: Insufficient documentation

## 2011-03-06 ENCOUNTER — Inpatient Hospital Stay (INDEPENDENT_AMBULATORY_CARE_PROVIDER_SITE_OTHER)
Admission: RE | Admit: 2011-03-06 | Discharge: 2011-03-06 | Disposition: A | Discharge status: Home / Self Care | Payer: Self-pay | Source: Ambulatory Visit | Attending: Family Medicine | Admitting: Family Medicine

## 2011-03-06 DIAGNOSIS — R05 Cough: Secondary | ICD-10-CM

## 2011-03-30 ENCOUNTER — Inpatient Hospital Stay (INDEPENDENT_AMBULATORY_CARE_PROVIDER_SITE_OTHER)
Admission: RE | Admit: 2011-03-30 | Discharge: 2011-03-30 | Disposition: A | Discharge status: Home / Self Care | Payer: Self-pay | Source: Ambulatory Visit | Attending: Emergency Medicine | Admitting: Emergency Medicine

## 2011-03-30 DIAGNOSIS — L259 Unspecified contact dermatitis, unspecified cause: Secondary | ICD-10-CM

## 2011-03-30 DIAGNOSIS — I872 Venous insufficiency (chronic) (peripheral): Secondary | ICD-10-CM

## 2011-03-30 DIAGNOSIS — J45909 Unspecified asthma, uncomplicated: Secondary | ICD-10-CM

## 2011-04-16 ENCOUNTER — Inpatient Hospital Stay (INDEPENDENT_AMBULATORY_CARE_PROVIDER_SITE_OTHER)
Admission: RE | Admit: 2011-04-16 | Discharge: 2011-04-16 | Disposition: A | Discharge status: Home / Self Care | Payer: Self-pay | Source: Ambulatory Visit | Attending: Emergency Medicine | Admitting: Emergency Medicine

## 2011-04-16 DIAGNOSIS — R05 Cough: Secondary | ICD-10-CM

## 2011-04-16 DIAGNOSIS — L989 Disorder of the skin and subcutaneous tissue, unspecified: Secondary | ICD-10-CM

## 2011-04-25 ENCOUNTER — Encounter: Payer: Self-pay | Admitting: Pulmonary Disease

## 2011-04-25 LAB — URINALYSIS, ROUTINE W REFLEX MICROSCOPIC
Glucose, UA: NEGATIVE
Hgb urine dipstick: NEGATIVE
Specific Gravity, Urine: 1.024
Urobilinogen, UA: 1

## 2011-04-26 ENCOUNTER — Ambulatory Visit (INDEPENDENT_AMBULATORY_CARE_PROVIDER_SITE_OTHER)
Admission: RE | Admit: 2011-04-26 | Discharge: 2011-04-26 | Disposition: A | Discharge status: Home / Self Care | Payer: Medicaid Other | Source: Ambulatory Visit | Attending: Adult Health | Admitting: Adult Health

## 2011-04-26 ENCOUNTER — Ambulatory Visit (INDEPENDENT_AMBULATORY_CARE_PROVIDER_SITE_OTHER): Payer: Medicaid Other | Admitting: Adult Health

## 2011-04-26 ENCOUNTER — Encounter: Payer: Self-pay | Admitting: Adult Health

## 2011-04-26 VITALS — BP 126/80 | HR 110 | Temp 97.9°F | Ht 70.0 in | Wt 342.4 lb

## 2011-04-26 DIAGNOSIS — R0600 Dyspnea, unspecified: Secondary | ICD-10-CM

## 2011-04-26 DIAGNOSIS — R0609 Other forms of dyspnea: Secondary | ICD-10-CM

## 2011-04-26 MED ORDER — OLOPATADINE HCL 0.6 % NA SOLN: 2.0000 | Freq: Every day | NASAL | Status: DC

## 2011-04-26 NOTE — Patient Instructions (Signed)
Continue on Zyrtec 10mg  daily in am  Stop Claritin.  Begin Chlor tabs 4mg   2 tabs every 6 hr as needed drainage.  Saline nasal rinses As needed   Begin Patanase Nasal 2 puffs At bedtime   Continue on Nasonex 2 puffs in am.  Please contact office for sooner follow up if symptoms do not improve or worsen or seek emergency care  follow up Dr. Marchelle Gearing in 2 months and As needed

## 2011-04-26 NOTE — Assessment & Plan Note (Signed)
Flare   Plan:  Continue on Zyrtec 10mg  daily in am  Stop Claritin.  Begin Chlor tabs 4mg   2 tabs every 6 hr as needed drainage.  Saline nasal rinses As needed   Begin Patanase Nasal 2 puffs At bedtime   Continue on Nasonex 2 puffs in am.  Please contact office for sooner follow up if symptoms do not improve or worsen or seek emergency care  follow up Dr. Marchelle Gearing in 2 months and As needed

## 2011-04-26 NOTE — Progress Notes (Signed)
Subjective:    Patient ID: Stacey [name], female    DOB: 07/01/1966, 45 y.o.   MRN: [id number]  HPI 45 yo female with known hx of VCD and sinus related LPR cough, Recurrent acute sinuisitis in obese female, moderate OSA, bipolar, with hx of  multiple ER visits for respiratory issues.   May 17, 2010: Last seen March 29, 2010. Neurontin startred for VCD; she had refused repeat speech therapy and could not afford seeing ENT. Neurontin now upped to 400mg  three times a day by her pyschiatrist. Cough largely resolved. Still gets cough when exposed to stress, cigarettes, pollution or smoke or weather changes. Some few weeks ago [facility] put her back on QVAR ( in spring 2011 my NP also had started her on qvar) despite her being MCT test negative for atshma in past. STates QVAR also helping. Wants to continue QVAR.  04/26/2011 Acute OV  Pt states has had increased SOB x 2 months, relates to "the ragweed season". She states that SOB is esp worse in the early morning. She states gets OOB with walking short distances and taking shower. More coughing esp in evening . Coughing up yellow and white mucus. Seen at urgent care x 3 times recently , given zpack x 2. Has a lot of throat drainage despite zyrtec and claritin daily and saline washes.       Past Medical History:  #"ASTHMA" and DYSPNEA...Marland KitchenDr Marchelle Gearing  > acute visits 4/24 to ER, 4/28 to [facility], 4/30/201 to [facility], 12/10/2008 to er  >Normal CXR 11/20/2008, 02/09/2008, 12/23/2007 -  >Spiro 12/08/2008: FeV1 2.68L/82%, FVC 3.3L.78%, Ratio 81 (82), Fef 25-75%: 3.4/96%  >negative methacholine challenge test July 2010  >Normal CT chest 02/24/2009 (PE ruled out during ER visit and hospitalization)  >CPST 02/28/2009: obesity as primary cause for dyspnea. NEgative test for exercise induced bronchospasm  > [place] 03/23/2009 - VCD +  -Neurontin trial - SUCCESS in sept 2011  #ALLERGY SINUS TROUBLE.... Dr Fannie Knee  > Skin test 11/30/09 Skin test- limited  positives for ragweed and some trees.  #BIPOLAR/PTSD/OCD  #MORBID OBESITYwith intentionalweight loss -  > BMI 49  > BMI 36.8 Augutst 2011  > 360# in June 2010 -> 291# FEb 2011  #Moderate Sleep APnea.Marland KitchenMarland KitchenDr Vassie Loll  > y-OSA-started on CPAP-12/2008.  #Diffuse Hepatic STeatosis  >seen on Ct chest july 2010  #Thyroid Nodule on CT chest july 2010  > s/p bx 04/28/2009 - lymphocytes, histiocytes, and debris    Review of Systems Constitutional:   No  weight loss, night sweats,  Fevers, chills, fatigue, or  lassitude.  HEENT:   No headaches,  Difficulty swallowing,  Tooth/dental problems, or  Sore throat,                No sneezing, itching, ear ache, nasal congestion, post nasal drip,   CV:  No chest pain,  Orthopnea, PND, swelling in lower extremities, anasarca, dizziness, palpitations, syncope.   GI  No heartburn, indigestion, abdominal pain, nausea, vomiting, diarrhea, change in bowel habits, loss of appetite, bloody stools.   Resp: No shortness of breath with exertion or at rest.  No excess mucus, no productive cough,  No non-productive cough,  No coughing up of blood.  No change in color of mucus.  No wheezing.  No chest wall deformity  Skin: no rash or lesions.  GU: no dysuria, change in color of urine, no urgency or frequency.  No flank pain, no hematuria   MS:  No joint pain or swelling.  No decreased range of motion.  No back pain  Psych:  No change in mood or affect. No depression or anxiety.  No memory loss.          Objective:   Physical Exam GEN: A/Ox3; pleasant , NAD, well nourished   HEENT:  [place]/AT,  EACs-clear, TMs-wnl, NOSE-clear, THROAT-clear, no lesions, no postnasal drip or exudate noted.   NECK:  Supple w/ fair ROM; no JVD; normal carotid impulses w/o bruits; no thyromegaly or nodules palpated; no lymphadenopathy.  RESP  Clear  P & A; w/o, wheezes/ rales/ or rhonchi.no accessory muscle use, no dullness to percussion  CARD:  RRR, no m/r/g  , no peripheral edema,  pulses intact, no cyanosis or clubbing.  GI:   Soft & nt; nml bowel sounds; no organomegaly or masses detected.  Musco: Warm bil, no deformities or joint swelling noted.   Neuro: alert, no focal deficits noted.    Skin: Warm, no lesions or rashes         Assessment & Plan:

## 2011-04-27 ENCOUNTER — Other Ambulatory Visit: Payer: Self-pay | Admitting: *Deleted

## 2011-04-27 ENCOUNTER — Telehealth: Payer: Self-pay | Admitting: Internal Medicine

## 2011-04-27 MED ORDER — PANTOPRAZOLE SODIUM 40 MG PO TBEC: 40.0000 mg | DELAYED_RELEASE_TABLET | Freq: Every day | ORAL | Status: DC

## 2011-04-27 MED ORDER — ESOMEPRAZOLE MAGNESIUM 40 MG PO CPDR: 40.0000 mg | DELAYED_RELEASE_CAPSULE | Freq: Every day | ORAL | Status: DC

## 2011-04-27 NOTE — Telephone Encounter (Signed)
Per TP okay to send rx for pt

## 2011-04-27 NOTE — Telephone Encounter (Signed)
Pantoprazole 40mg  daily is fine

## 2011-04-27 NOTE — Telephone Encounter (Signed)
Called [facility] - spoke with Victorino Dike.  She was advised ok to change nexium to pantoprazole 40mg  daily # 30 x 1.  She verbalized understanding of this.  Nothing further needed.    Called, spoke with pt.  I informed her [facility] does not carry nexium so it was changed to pantoprazole 40mg  qd.  She verbalized understanding of this and voiced no further questions/concerns.

## 2011-04-27 NOTE — Telephone Encounter (Signed)
Stacey [name], pt was seen by you yesterday - nexium 40mg   rx called in today.  Per Victorino Dike with [facility], they do not carry this.  They have either pantoprazole or omeprazole.  Are you ok with nexium being switched to one of these?

## 2011-05-07 ENCOUNTER — Emergency Department (HOSPITAL_COMMUNITY): Payer: Medicaid Other

## 2011-05-07 ENCOUNTER — Emergency Department (HOSPITAL_COMMUNITY)
Admission: EM | Admit: 2011-05-07 | Discharge: 2011-05-08 | Disposition: A | Discharge status: Home / Self Care | Payer: Medicaid Other | Attending: Emergency Medicine | Admitting: Emergency Medicine

## 2011-05-07 DIAGNOSIS — F489 Nonpsychotic mental disorder, unspecified: Secondary | ICD-10-CM | POA: Insufficient documentation

## 2011-05-07 DIAGNOSIS — N938 Other specified abnormal uterine and vaginal bleeding: Secondary | ICD-10-CM | POA: Insufficient documentation

## 2011-05-07 DIAGNOSIS — F429 Obsessive-compulsive disorder, unspecified: Secondary | ICD-10-CM | POA: Insufficient documentation

## 2011-05-07 DIAGNOSIS — F319 Bipolar disorder, unspecified: Secondary | ICD-10-CM | POA: Insufficient documentation

## 2011-05-07 DIAGNOSIS — N949 Unspecified condition associated with female genital organs and menstrual cycle: Secondary | ICD-10-CM | POA: Insufficient documentation

## 2011-05-07 DIAGNOSIS — F4310 Post-traumatic stress disorder, unspecified: Secondary | ICD-10-CM | POA: Insufficient documentation

## 2011-05-07 DIAGNOSIS — D509 Iron deficiency anemia, unspecified: Secondary | ICD-10-CM | POA: Insufficient documentation

## 2011-05-07 LAB — DIFFERENTIAL
Basophils Relative: 0 % (ref 0–1)
Eosinophils Absolute: 0.1 10*3/uL (ref 0.0–0.7)
Lymphs Abs: 1.2 10*3/uL (ref 0.7–4.0)
Monocytes Absolute: 0.7 10*3/uL (ref 0.1–1.0)
Neutrophils Relative %: 83 % — ABNORMAL HIGH (ref 43–77)

## 2011-05-07 LAB — COMPREHENSIVE METABOLIC PANEL
Albumin: 3.7 g/dL (ref 3.5–5.2)
Alkaline Phosphatase: 128 U/L — ABNORMAL HIGH (ref 39–117)
BUN: 5 mg/dL — ABNORMAL LOW (ref 6–23)
Calcium: 9.9 mg/dL (ref 8.4–10.5)
Creatinine, Ser: 0.7 mg/dL (ref 0.50–1.10)
GFR calc Af Amer: >90 mL/min (ref >90)
Glucose, Bld: 98 mg/dL (ref 70–99)
Potassium: 4.4 mEq/L (ref 3.5–5.1)
Total Protein: 8.5 g/dL — ABNORMAL HIGH (ref 6.0–8.3)

## 2011-05-07 LAB — RAPID URINE DRUG SCREEN, HOSP PERFORMED
Barbiturates: NOT DETECTED
Opiates: NOT DETECTED

## 2011-05-07 LAB — CBC
HCT: 28.9 % — ABNORMAL LOW (ref 36.0–46.0)
MCH: 18.4 pg — ABNORMAL LOW (ref 26.0–34.0)
MCHC: 27.3 g/dL — ABNORMAL LOW (ref 30.0–36.0)
RDW: 18.5 % — ABNORMAL HIGH (ref 11.5–15.5)

## 2011-05-07 LAB — ETHANOL: Alcohol, Ethyl (B): <11 mg/dL (ref 0–11)

## 2011-05-10 ENCOUNTER — Observation Stay (HOSPITAL_COMMUNITY)
Admission: AD | Admit: 2011-05-10 | Discharge: 2011-05-11 | Disposition: A | Discharge status: Home / Self Care | Payer: Medicaid Other | Source: Ambulatory Visit | Attending: Obstetrics & Gynecology | Admitting: Obstetrics & Gynecology

## 2011-05-10 ENCOUNTER — Encounter (HOSPITAL_COMMUNITY): Payer: Self-pay | Admitting: *Deleted

## 2011-05-10 DIAGNOSIS — N938 Other specified abnormal uterine and vaginal bleeding: Principal | ICD-10-CM | POA: Insufficient documentation

## 2011-05-10 DIAGNOSIS — D649 Anemia, unspecified: Secondary | ICD-10-CM | POA: Insufficient documentation

## 2011-05-10 DIAGNOSIS — N949 Unspecified condition associated with female genital organs and menstrual cycle: Secondary | ICD-10-CM | POA: Insufficient documentation

## 2011-05-10 DIAGNOSIS — R42 Dizziness and giddiness: Secondary | ICD-10-CM | POA: Insufficient documentation

## 2011-05-10 HISTORY — DX: Anxiety disorder, unspecified: F41.9

## 2011-05-10 HISTORY — DX: Mental disorder, not otherwise specified: F99

## 2011-05-10 HISTORY — DX: Thalassemia minor: D56.3

## 2011-05-10 LAB — RETICULOCYTES
RBC.: 4.11 MIL/uL (ref 3.87–5.11)
Retic Ct Pct: 3.7 % — ABNORMAL HIGH (ref 0.4–3.1)

## 2011-05-10 LAB — IRON AND TIBC
Saturation Ratios: 7 % — ABNORMAL LOW (ref 20–55)
TIBC: 363 ug/dL (ref 250–470)

## 2011-05-10 LAB — CBC
MCH: 18.7 pg — ABNORMAL LOW (ref 26.0–34.0)
MCHC: 27.3 g/dL — ABNORMAL LOW (ref 30.0–36.0)
MCV: 68.6 fL — ABNORMAL LOW (ref 78.0–100.0)
Platelets: 366 10*3/uL (ref 150–400)
RBC: 4.11 MIL/uL (ref 3.87–5.11)

## 2011-05-10 LAB — FERRITIN: Ferritin: 7 ng/mL — ABNORMAL LOW (ref 10–291)

## 2011-05-10 LAB — ABO/RH: ABO/RH(D): O POS

## 2011-05-10 LAB — PREPARE RBC (CROSSMATCH)

## 2011-05-10 MED ORDER — DIPHENHYDRAMINE HCL 25 MG PO CAPS
25.0000 mg | ORAL_CAPSULE | Freq: Once | ORAL | Status: AC
  Administered 2011-05-10: 25 mg via ORAL
  Filled 2011-05-10: qty 1

## 2011-05-10 MED ORDER — PANTOPRAZOLE SODIUM 40 MG PO TBEC
40.0000 mg | DELAYED_RELEASE_TABLET | Freq: Every day | ORAL | Status: DC
  Administered 2011-05-10: 40 mg via ORAL
  Filled 2011-05-10 (×3): qty 1

## 2011-05-10 MED ORDER — OLOPATADINE HCL 0.6 % NA SOLN
2.0000 | Freq: Every day | NASAL | Status: DC
  Administered 2011-05-10: 2 via NASAL

## 2011-05-10 MED ORDER — LORATADINE 10 MG PO TABS
10.0000 mg | ORAL_TABLET | Freq: Every day | ORAL | Status: DC
  Administered 2011-05-10 – 2011-05-11 (×2): 10 mg via ORAL
  Filled 2011-05-10 (×3): qty 1

## 2011-05-10 MED ORDER — LORATADINE 10 MG PO TABS: 10.0000 mg | ORAL_TABLET | Freq: Every day | ORAL | Status: DC

## 2011-05-10 MED ORDER — THERA M PLUS PO TABS
1.0000 | ORAL_TABLET | Freq: Every day | ORAL | Status: DC
  Administered 2011-05-10 – 2011-05-11 (×2): 1 via ORAL
  Filled 2011-05-10 (×3): qty 1

## 2011-05-10 MED ORDER — GUAIFENESIN ER 600 MG PO TB12
600.0000 mg | ORAL_TABLET | Freq: Two times a day (BID) | ORAL | Status: DC
  Administered 2011-05-10 – 2011-05-11 (×2): 600 mg via ORAL
  Filled 2011-05-10 (×4): qty 1

## 2011-05-10 MED ORDER — FLUTICASONE PROPIONATE HFA 44 MCG/ACT IN AERO
1.0000 | INHALATION_SPRAY | Freq: Two times a day (BID) | RESPIRATORY_TRACT | Status: DC
  Administered 2011-05-10 – 2011-05-11 (×2): 1 via RESPIRATORY_TRACT
  Filled 2011-05-10: qty 10.6

## 2011-05-10 MED ORDER — ACETAMINOPHEN 325 MG PO TABS
650.0000 mg | ORAL_TABLET | Freq: Once | ORAL | Status: AC
  Administered 2011-05-10: 650 mg via ORAL
  Filled 2011-05-10: qty 1

## 2011-05-10 MED ORDER — SODIUM CHLORIDE 0.9 % IV SOLN
INTRAVENOUS | Status: DC
  Administered 2011-05-10: 17:00:00 via INTRAVENOUS

## 2011-05-10 MED ORDER — ESCITALOPRAM OXALATE 10 MG PO TABS
10.0000 mg | ORAL_TABLET | Freq: Every day | ORAL | Status: DC
  Administered 2011-05-10: 10 mg via ORAL
  Filled 2011-05-10 (×2): qty 1

## 2011-05-10 MED ORDER — EPINEPHRINE 0.3 MG/0.3ML IJ DEVI
0.3000 mg | Freq: Once | INTRAMUSCULAR | Status: DC
Start: 2011-05-10 — End: 2011-05-10

## 2011-05-10 MED ORDER — FLUTICASONE PROPIONATE 50 MCG/ACT NA SUSP
1.0000 | Freq: Every day | NASAL | Status: DC
  Administered 2011-05-11: 1 via NASAL
  Filled 2011-05-10: qty 16

## 2011-05-10 MED ORDER — PSEUDOEPHEDRINE HCL 30 MG PO TABS: 60.0000 mg | ORAL_TABLET | ORAL | Status: DC | PRN

## 2011-05-10 MED ORDER — ALBUTEROL SULFATE HFA 108 (90 BASE) MCG/ACT IN AERS
2.0000 | INHALATION_SPRAY | Freq: Four times a day (QID) | RESPIRATORY_TRACT | Status: DC | PRN
  Filled 2011-05-10: qty 6.7

## 2011-05-10 MED ORDER — LORAZEPAM 1 MG PO TABS: 1.0000 mg | ORAL_TABLET | Freq: Three times a day (TID) | ORAL | Status: DC | PRN

## 2011-05-10 MED ORDER — LITHIUM CARBONATE 300 MG PO CAPS
300.0000 mg | ORAL_CAPSULE | Freq: Two times a day (BID) | ORAL | Status: DC
  Administered 2011-05-10 – 2011-05-11 (×2): 300 mg via ORAL
  Filled 2011-05-10 (×4): qty 1

## 2011-05-10 MED ORDER — CALCIUM CARBONATE ANTACID 500 MG PO CHEW: 1.0000 | CHEWABLE_TABLET | Freq: Three times a day (TID) | ORAL | Status: DC | PRN

## 2011-05-10 MED ORDER — GABAPENTIN 300 MG PO CAPS
300.0000 mg | ORAL_CAPSULE | Freq: Three times a day (TID) | ORAL | Status: DC
  Administered 2011-05-10 – 2011-05-11 (×2): 300 mg via ORAL
  Filled 2011-05-10 (×5): qty 1

## 2011-05-10 MED ORDER — FERROUS SULFATE 325 (65 FE) MG PO TABS
325.0000 mg | ORAL_TABLET | Freq: Every day | ORAL | Status: DC
  Administered 2011-05-11: 325 mg via ORAL
  Filled 2011-05-10: qty 1

## 2011-05-10 NOTE — Progress Notes (Signed)
Pt states she has been having irregular cycles x2 years, was seen at [facility] on Monday 10/8 for anemia and cleared for [facility] psych admit for 24 hours. Pt was told she was anemic and was given a RX for Iron which makes her nauseated. No bleeding or pain at this time.  Pt states she feels dizzy at this time.

## 2011-05-10 NOTE — Progress Notes (Signed)
GAVE REPORT TO KIM RN  - TRANSFER TO [phone]

## 2011-05-10 NOTE — Progress Notes (Signed)
TRANSFER  VIA W/C      -  PT VERY TALKATIVE.  HUSBAND WITH PT.

## 2011-05-10 NOTE — ED Provider Notes (Signed)
Stacey [name] [id number] @Unknown  Chief Complaint  Patient presents with  . Vaginal Bleeding  . Near Syncope    SUBJECTIVE  HPI: She gives a two-day history of dizziness which is progressively worsening and at present is dizzy even sitting up at rest. She was discharged from [facility] 2 days ago where she was admitted due to mania episode. During that admission she was found to be anemic with hemoglobin of 7.9 on 05/07/2011.they gave her oral iron however she had a self DC'd that due to diarrhea, finger swelling and itching which she attributes to taking the iron supplement. Menstrual history is significant for her being fairly regular every 3-4 weeks for the past one to 2 years. She typically has heavy flow with clots for 2 days preceded by and followed by light spotting for a few days. She states that she's not bleeding now for at least a few months has had inter menstrual spotting in an unpredictable pattern and postcoital spotting. She says there some months where she has spotting almost every day. The only new meds are lithium (which she's been on before without problem) and an antihistamine.   Past Medical History  Diagnosis Date  . Asthma   . Allergic rhinitis, cause unspecified   . Bipolar 1 disorder   . Morbid obesity   . OSA (obstructive sleep apnea)   . Thyroid nodule   . Mental disorder   . 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 Pt has never been tested.   GF and aunt have B thal minor Ob Hx: Gyn Hx: Past Surgical History  Procedure Date  . Tubal ligation   . Cesarean section   . Noraplant    History   Social History  . Marital Status: Married    Spouse Name: N/A    Number of Children: N/A  . Years of Education: N/A   Occupational History  . Unemployed    Social History Main Topics  . Smoking status: Former Smoker -- 0.5 packs/day for 10 years    Types: Cigarettes    Quit date: 07/30/2006  . Smokeless tobacco: Never Used  .  Alcohol Use: No  . Drug Use: No  . Sexually Active: Yes    Birth Control/ Protection: Surgical   Other Topics Concern  . Not on file   Social History Narrative  . No narrative on file   No current facility-administered medications on file prior to encounter.   Current Outpatient Prescriptions on File Prior to Encounter  Medication Sig Dispense Refill  . beclomethasone (QVAR) 40 MCG/ACT inhaler Inhale 2 puffs into the lungs 3 (three) times daily.       . calcium carbonate (TUMS - DOSED IN MG ELEMENTAL CALCIUM) 500 MG chewable tablet As needed after meals       . cetirizine (ZYRTEC) 10 MG tablet Take 10 mg by mouth daily.        . diphenhydrAMINE (SOMINEX) 25 MG tablet Take 50 mg by mouth every 4 (four) hours as needed.       Marland Kitchen escitalopram (LEXAPRO) 10 MG tablet Take 10 mg by mouth at bedtime.        . gabapentin (NEURONTIN) 300 MG capsule Take 300 mg by mouth 3 (three) times daily.        Marland Kitchen guaiFENesin (MUCINEX) 600 MG 12 hr tablet Take 600 mg by mouth 2 (two) times daily.        Marland Kitchen loratadine (CLARITIN) 10 MG tablet Take  10 mg by mouth daily.        Marland Kitchen LORazepam (ATIVAN) 1 MG tablet 1/2 to 1 three times a day as needed      . mometasone (NASONEX) 50 MCG/ACT nasal spray Place 1 spray into the nose daily.        . multivitamin (THERAGRAN) per tablet Take 1 tablet by mouth daily.        . Olopatadine HCl 0.6 % SOLN Place 2 puffs into the nose at bedtime.  30.5 g  5  . pantoprazole (PROTONIX) 40 MG tablet Take 1 tablet (40 mg total) by mouth daily.  30 tablet  1  . pseudoephedrine (SUDAFED) 30 MG tablet Take 60 mg by mouth every 4 (four) hours as needed.        Marland Kitchen albuterol (PROVENTIL HFA;VENTOLIN HFA) 108 (90 BASE) MCG/ACT inhaler Inhale 2 puffs into the lungs every 6 (six) hours as needed.        . Cetylpyridinium Chloride (CEPACOL MOUTHWASH/GARGLE) 0.05 % LIQD Use as directed in the mouth or throat.        Marland Kitchen EPINEPHrine (EPI-PEN) 0.3 mg/0.3 mL DEVI Inject 0.3 mg into the muscle once.         . topiramate (TOPAMAX) 100 MG tablet Take 100 mg by mouth 2 (two) times daily.        Marland Kitchen topiramate (TOPAMAX) 25 MG tablet Take 25 mg by mouth at bedtime.         Allergies  Allergen Reactions  . Bupropion Hcl     REACTION: swelling in hands  . Citalopram Hydrobromide   . Lamictal (Lamotrigine) Rash    ROS: Pertinent items in HPI  OBJECTIVE  BP 140/82  Pulse 121  Temp(Src) 98.7 F (37.1 C) (Oral)  Resp 20  Ht 5\' 10"  (1.778 m)  Wt 148.893 kg (328 lb 4 oz)  BMI 47.10 kg/m2  SpO2 99%  LMP 05/06/2011   Physical Exam:  General: Morbidly obese in NAD Skin: pale, dry Psych: A&O x 3, agitated, some flight of ideas Abd: obese NT ND Pelvic: NEFG, could not visualize cx with large spec; no blood, no vag lesion, physiologic discharge Back: neg CVAT Ext:tr edema  Results for orders placed during the hospital encounter of 05/10/11 (from the past 24 hour(s))  CBC     Status: Abnormal   Collection Time   05/10/11  3:10 PM      Component Value Range   WBC 10.3  4.0 - 10.5 (K/uL)   RBC 4.11  3.87 - 5.11 (MIL/uL)   Hemoglobin 7.7 (*) 12.0 - 15.0 (g/dL)   HCT 96.2 (*) 95.2 - 46.0 (%)   MCV 68.6 (*) 78.0 - 100.0 (fL)   MCH 18.7 (*) 26.0 - 34.0 (pg)   MCHC 27.3 (*) 30.0 - 36.0 (g/dL)   RDW 84.1 (*) 32.4 - 15.5 (%)   Platelets 366  150 - 400 (K/uL)  RETICULOCYTES     Status: Abnormal   Collection Time   05/10/11  3:10 PM      Component Value Range   Retic Ct Pct 3.7 (*) 0.4 - 3.1 (%)   RBC. 4.11  3.87 - 5.11 (MIL/uL)   Retic Count, Manual 152.1  19.0 - 186.0 (K/uL)  TYPE AND SCREEN     Status: Normal (Preliminary result)   Collection Time   05/10/11  3:10 PM      Component Value Range   ABO/RH(D) O POS     Antibody Screen PENDING  Sample Expiration 05/13/2011     See orthostatics: standing tachycardia but BP stable  ASSESSMENT  AUB Symptomatic anemia   PLAN   Pelvic US ordered and care turned over to Henrietta Hoover, PA F/U appt at [facility] clinic

## 2011-05-11 LAB — CBC
HCT: 30.4 % — ABNORMAL LOW (ref 36.0–46.0)
Hemoglobin: 8.5 g/dL — ABNORMAL LOW (ref 12.0–15.0)
MCH: 19.7 pg — ABNORMAL LOW (ref 26.0–34.0)
MCHC: 28 g/dL — ABNORMAL LOW (ref 30.0–36.0)
MCV: 70.5 fL — ABNORMAL LOW (ref 78.0–100.0)

## 2011-05-11 LAB — TYPE AND SCREEN
ABO/RH(D): O POS
Unit division: 0

## 2011-05-11 NOTE — Progress Notes (Signed)
UR Chart review completed.  

## 2011-05-11 NOTE — Discharge Summary (Signed)
Physician Discharge Summary  Patient ID: VERLINDA SLOTNICK MRN: [id number] DOB/AGE: Mar 19, 1966 45 y.o.  Admit date: 05/10/2011 Discharge date: 05/11/2011  Admission Diagnoses:  Vaginal Bleeding, symptomatic anemia  Discharge Diagnoses: Vaginal bleeding, symptomatic anemia, s/p blood transfusion  Discharged Condition: good  Hospital Course:  This is a 45 year old woman who was admitted on 05/10/2011 for abnormal uterine bleeding and symptomatic anemia. The patient was administered 2 units of packed red blood cells. The patient tolerated the procedure without any complications. The patient had a repeat hemoglobin which was improved to 8.5. The patient is currently being discharged in stable condition to followup with her primary care provider within a week.  Significant Diagnostic Studies: None  Treatments: Transfusion of 2 units packed red blood cells  Discharge Exam: Blood pressure 136/77, pulse 79, temperature 97.8 F (36.6 C), temperature source Oral, resp. rate 20, height 5\' 10"  (1.778 m), weight 149.37 kg (329 lb 4.8 oz), last menstrual period 05/06/2011, SpO2 98.00%. General appearance: alert, cooperative and no distress Head: Normocephalic, without obvious abnormality, atraumatic Eyes: conjunctivae/corneas clear. PERRL, EOM's intact. Fundi benign. Resp: clear to auscultation bilaterally Cardio: regular rate and rhythm, S1, S2 normal, no murmur, click, rub or gallop GI: soft, non-tender; bowel sounds normal; no masses,  no organomegaly Extremities: no edema, redness or tenderness in the calves or thighs Pulses: 2+ and symmetric  Disposition: Home or Self Care  Discharge Orders    Future Appointments: Provider: Department: Dept Phone: Center:   05/30/2011 2:00 PM Amada Kingfisher, MD [facility] [phone] [facility]   06/26/2011 10:00 AM Kalman Shan, MD [facility] [phone] None     Current Discharge Medication List    CONTINUE these medications which have  NOT CHANGED   Details  beclomethasone (QVAR) 40 MCG/ACT inhaler Inhale 2 puffs into the lungs 3 (three) times daily.     calcium carbonate (TUMS - DOSED IN MG ELEMENTAL CALCIUM) 500 MG chewable tablet As needed after meals     cetirizine (ZYRTEC) 10 MG tablet Take 10 mg by mouth daily.      diphenhydrAMINE (SOMINEX) 25 MG tablet Take 50 mg by mouth every 4 (four) hours as needed.     escitalopram (LEXAPRO) 10 MG tablet Take 10 mg by mouth at bedtime.      ferrous sulfate 325 (65 FE) MG tablet Take 325 mg by mouth daily with breakfast.      gabapentin (NEURONTIN) 300 MG capsule Take 300 mg by mouth 3 (three) times daily.      guaiFENesin (MUCINEX) 600 MG 12 hr tablet Take 600 mg by mouth 2 (two) times daily.      lithium carbonate 300 MG capsule Take 300 mg by mouth 2 (two) times daily.      loratadine (CLARITIN) 10 MG tablet Take 10 mg by mouth daily.      LORazepam (ATIVAN) 1 MG tablet 1/2 to 1 three times a day as needed    mometasone (NASONEX) 50 MCG/ACT nasal spray Place 1 spray into the nose daily.      multivitamin (THERAGRAN) per tablet Take 1 tablet by mouth daily.      Olopatadine HCl 0.6 % SOLN Place 2 puffs into the nose at bedtime. Qty: 30.5 g, Refills: 5    pantoprazole (PROTONIX) 40 MG tablet Take 1 tablet (40 mg total) by mouth daily. Qty: 30 tablet, Refills: 1    pseudoephedrine (SUDAFED) 30 MG tablet Take 60 mg by mouth every 4 (four) hours as needed.  albuterol (PROVENTIL HFA;VENTOLIN HFA) 108 (90 BASE) MCG/ACT inhaler Inhale 2 puffs into the lungs every 6 (six) hours as needed.      Cetylpyridinium Chloride (CEPACOL MOUTHWASH/GARGLE) 0.05 % LIQD Use as directed in the mouth or throat.      EPINEPHrine (EPI-PEN) 0.3 mg/0.3 mL DEVI Inject 0.3 mg into the muscle once.      !! topiramate (TOPAMAX) 100 MG tablet Take 100 mg by mouth 2 (two) times daily.      !! topiramate (TOPAMAX) 25 MG tablet Take 25 mg by mouth at bedtime.       !! - Potential  duplicate medications found. Please discuss with provider.       SignedCandelaria Celeste JEHIEL 05/11/2011, 1:47 PM

## 2011-05-12 NOTE — H&P (Signed)
SUBJECTIVE  HPI: She gives a two-day history of dizziness which is progressively worsening and at present is dizzy even sitting up at rest. She was discharged from [facility] 2 days ago where she was admitted due to mania episode. During that admission she was found to be anemic with hemoglobin of 7.9 on 05/07/2011.they gave her oral iron however she had a self DC'd that due to diarrhea, finger swelling and itching which she attributes to taking the iron supplement.  Menstrual history is significant for her being fairly regular every 3-4 weeks for the past one to 2 years. She typically has heavy flow with clots for 2 days preceded by and followed by light spotting for a few days. She states that she's not bleeding now for at least a few months has had inter menstrual spotting in an unpredictable pattern and postcoital spotting. She says there some months where she has spotting almost every day.  The only new meds are lithium (which she's been on before without problem) and an antihistamine.  Past Medical History   Diagnosis  Date   .  Asthma    .  Allergic rhinitis, cause unspecified    .  Bipolar 1 disorder    .  Morbid obesity    .  OSA (obstructive sleep apnea)    .  Thyroid nodule    .  Mental disorder    .  Beta thalassemia minor  Pt has a family hx. Pt has never been tested.   GF and aunt have B thal minor  Ob Hx:  Gyn Hx:  Past Surgical History   Procedure  Date   .  Tubal ligation    .  Cesarean section    .  Noraplant     History    Social History   .  Marital Status:  Married     Spouse Name:  N/A     Number of Children:  N/A   .  Years of Education:  N/A    Occupational History   .  Unemployed     Social History Main Topics   .  Smoking status:  Former Smoker -- 0.5 packs/day for 10 years     Types:  Cigarettes     Quit date:  07/30/2006   .  Smokeless tobacco:  Never Used   .  Alcohol Use:  No   .  Drug Use:  No   .  Sexually Active:  Yes    Birth Control/ Protection:  Surgical    Other Topics  Concern   .  Not on file    Social History Narrative   .  No narrative on file    No current facility-administered medications on file prior to encounter.    Current Outpatient Prescriptions on File Prior to Encounter   Medication  Sig  Dispense  Refill   .  beclomethasone (QVAR) 40 MCG/ACT inhaler  Inhale 2 puffs into the lungs 3 (three) times daily.     .  calcium carbonate (TUMS - DOSED IN MG ELEMENTAL CALCIUM) 500 MG chewable tablet  As needed after meals     .  cetirizine (ZYRTEC) 10 MG tablet  Take 10 mg by mouth daily.     .  diphenhydrAMINE (SOMINEX) 25 MG tablet  Take 50 mg by mouth every 4 (four) hours as needed.     Marland Kitchen  escitalopram (LEXAPRO) 10 MG tablet  Take 10 mg by mouth at bedtime.     Marland Kitchen  gabapentin (NEURONTIN) 300 MG capsule  Take 300 mg by mouth 3 (three) times daily.     Marland Kitchen  guaiFENesin (MUCINEX) 600 MG 12 hr tablet  Take 600 mg by mouth 2 (two) times daily.     Marland Kitchen  loratadine (CLARITIN) 10 MG tablet  Take 10 mg by mouth daily.     Marland Kitchen  LORazepam (ATIVAN) 1 MG tablet  1/2 to 1 three times a day as needed     .  mometasone (NASONEX) 50 MCG/ACT nasal spray  Place 1 spray into the nose daily.     .  multivitamin (THERAGRAN) per tablet  Take 1 tablet by mouth daily.     .  Olopatadine HCl 0.6 % SOLN  Place 2 puffs into the nose at bedtime.  30.5 g  5   .  pantoprazole (PROTONIX) 40 MG tablet  Take 1 tablet (40 mg total) by mouth daily.  30 tablet  1   .  pseudoephedrine (SUDAFED) 30 MG tablet  Take 60 mg by mouth every 4 (four) hours as needed.     Marland Kitchen  albuterol (PROVENTIL HFA;VENTOLIN HFA) 108 (90 BASE) MCG/ACT inhaler  Inhale 2 puffs into the lungs every 6 (six) hours as needed.     .  Cetylpyridinium Chloride (CEPACOL MOUTHWASH/GARGLE) 0.05 % LIQD  Use as directed in the mouth or throat.     Marland Kitchen  EPINEPHrine (EPI-PEN) 0.3 mg/0.3 mL DEVI  Inject 0.3 mg into the muscle once.     .  topiramate (TOPAMAX) 100 MG tablet  Take 100  mg by mouth 2 (two) times daily.     Marland Kitchen  topiramate (TOPAMAX) 25 MG tablet  Take 25 mg by mouth at bedtime.      Allergies   Allergen  Reactions   .  Bupropion Hcl      REACTION: swelling in hands   .  Citalopram Hydrobromide    .  Lamictal (Lamotrigine)  Rash   ROS: Pertinent items in HPI  OBJECTIVE  BP 140/82  Pulse 121  Temp(Src) 98.7 F (37.1 C) (Oral)  Resp 20  Ht 5\' 10"  (1.778 m)  Wt 148.893 kg (328 lb 4 oz)  BMI 47.10 kg/m2  SpO2 99%  LMP 05/06/2011  Physical Exam:  General: Morbidly obese in NAD  Skin: pale, dry  Psych: A&O x 3, agitated, some flight of ideas  Abd: obese NT ND  Pelvic: NEFG, could not visualize cx with large spec; no blood, no vag lesion, physiologic discharge  Back: neg CVAT  Ext:tr edema  Results for orders placed during the hospital encounter of 05/10/11 (from the past 24 hour(s))   CBC Status: Abnormal    Collection Time    05/10/11 3:10 PM   Component  Value  Range    WBC  10.3  4.0 - 10.5 (K/uL)    RBC  4.11  3.87 - 5.11 (MIL/uL)    Hemoglobin  7.7 (*)  12.0 - 15.0 (g/dL)    HCT  40.9 (*)  81.1 - 46.0 (%)    MCV  68.6 (*)  78.0 - 100.0 (fL)    MCH  18.7 (*)  26.0 - 34.0 (pg)    MCHC  27.3 (*)  30.0 - 36.0 (g/dL)    RDW  91.4 (*)  78.2 - 15.5 (%)    Platelets  366  150 - 400 (K/uL)   RETICULOCYTES Status: Abnormal    Collection Time    05/10/11 3:10 PM   Component  Value  Range    Retic Ct Pct  3.7 (*)  0.4 - 3.1 (%)    RBC.  4.11  3.87 - 5.11 (MIL/uL)    Retic Count, Manual  152.1  19.0 - 186.0 (K/uL)   TYPE AND SCREEN Status: Normal (Preliminary result)    Collection Time    05/10/11 3:10 PM   Component  Value  Range    ABO/RH(D)  O POS     Antibody Screen  PENDING     Sample Expiration  05/13/2011    See orthostatics: standing tachycardia but BP stable  ASSESSMENT  AUB  Symptomatic anemia  PLAN Admit for 2 units of blood--pt consented in [facility] with blood. Not bleeding currently Pt needs endometrial biopsy in the gyn clinic  after discharge.

## 2011-05-14 LAB — URINE CULTURE: Colony Count: 5000

## 2011-05-14 LAB — HEMOGLOBINOPATHY EVALUATION
Hemoglobin Other: 0 % (ref 0.0–0.0)
Hgb A2 Quant: 1.9 % — ABNORMAL LOW (ref 2.2–3.2)

## 2011-05-14 LAB — PREGNANCY, URINE: Preg Test, Ur: NEGATIVE

## 2011-05-14 LAB — URINALYSIS, ROUTINE W REFLEX MICROSCOPIC
Bilirubin Urine: NEGATIVE
Glucose, UA: NEGATIVE
Ketones, ur: NEGATIVE
Protein, ur: NEGATIVE

## 2011-05-15 ENCOUNTER — Telehealth: Payer: Self-pay | Admitting: Internal Medicine

## 2011-05-15 NOTE — Telephone Encounter (Signed)
I spoke with pt and advised her at this time we did not have any samples of qvar 40 at this time. Pt states she will call back at a later date and see if we have any then

## 2011-05-30 ENCOUNTER — Ambulatory Visit (INDEPENDENT_AMBULATORY_CARE_PROVIDER_SITE_OTHER): Payer: Self-pay | Admitting: Obstetrics and Gynecology

## 2011-05-30 ENCOUNTER — Encounter: Payer: Self-pay | Admitting: Obstetrics and Gynecology

## 2011-05-30 VITALS — BP 143/88 | HR 99 | Temp 98.1°F | Ht 70.0 in | Wt 337.4 lb

## 2011-05-30 DIAGNOSIS — D649 Anemia, unspecified: Secondary | ICD-10-CM

## 2011-05-30 DIAGNOSIS — Z23 Encounter for immunization: Secondary | ICD-10-CM

## 2011-05-30 DIAGNOSIS — N949 Unspecified condition associated with female genital organs and menstrual cycle: Secondary | ICD-10-CM

## 2011-05-30 DIAGNOSIS — N938 Other specified abnormal uterine and vaginal bleeding: Secondary | ICD-10-CM | POA: Insufficient documentation

## 2011-05-30 DIAGNOSIS — Z124 Encounter for screening for malignant neoplasm of cervix: Secondary | ICD-10-CM

## 2011-05-30 LAB — HEPATIC FUNCTION PANEL
ALT: 31 U/L (ref 0–35)
AST: 20 U/L (ref 0–37)
Total Protein: 7.4 g/dL (ref 6.0–8.3)

## 2011-05-30 LAB — POCT PREGNANCY, URINE: Preg Test, Ur: NEGATIVE

## 2011-05-30 MED ORDER — MEDROXYPROGESTERONE ACETATE 150 MG/ML IM SUSP
150.0000 mg | Freq: Once | INTRAMUSCULAR | Status: AC
  Administered 2011-05-30: 150 mg via INTRAMUSCULAR

## 2011-05-30 MED ORDER — INTEGRA F 125-1 MG PO CAPS: 1.0000 | ORAL_CAPSULE | Freq: Once | ORAL | Status: DC

## 2011-05-30 MED ORDER — INFLUENZA VIRUS VACC SPLIT PF IM SUSP
0.5000 mL | Freq: Once | INTRAMUSCULAR | Status: AC
  Administered 2011-05-30: 0.5 mL via INTRAMUSCULAR

## 2011-05-30 NOTE — Progress Notes (Signed)
Pt states that her periods usually start heavy for the first day and then it settles out, but continues to spot throughout the month.

## 2011-05-30 NOTE — Progress Notes (Signed)
This patient is a 45 year old morbidly obese white female gravida 6 para 48 with the youngest child age 72. She was seen in the emergency room and was in wrong hospital and admitted because of her bipolar condition being out of control. She was treated and discharged but sent to the [facility] because he discovered she had severe anemia. She received 2 blood transfusions at the [facility]. and left with a hemoglobin of 8.4. She was placed on iron. She came in today for followup and considering treatment. She had a pelvic ultrasound which was relatively normal with a normal appearing endometrium. Her periods over the last few years have been quite heavy with spotting in between. She was seen for a Pap smear and health serve that they difficulty because they could not visualize the cervix very well.  Examination: Abdomen flat soft nontender no masses no organomegaly but very obese. Pelvic: External genitalia normal BUS within normal limits vagina clean with moderate amount of blood in the vault well rugated. With the largest speculum we have the cervix was difficult to visualize except a portion in a Pap smear was taken. I think that endometrial biopsy in the office would be very difficult if not impossible.  Plan: The plan is to give the patient one shot of Depo-Provera 150 mg. Continue on the iron and since she's having difficulty taking a ferrous sulfate I'm giving her some samples of Integra to try and if that's more palatable she may have to switch. In addition I think we should get a repeat on her CBC. With the addition of the iron and hopefully the Depo-Provera controlling the bleeding she can build up her hemoglobin. She's also had some upper right quadrant tenderness and has gallstones which have been asymptomatic. So we will do a liver function profile.  Impression: Dysfunctional uterine bleeding. Secondary severe anemia. Sleep apnea. Bipolar condition. Multiple allergies both seasonal and medical.

## 2011-05-31 LAB — CBC
MCH: 21.9 pg — ABNORMAL LOW (ref 26.0–34.0)
Platelets: 525 10*3/uL — ABNORMAL HIGH (ref 150–400)
RBC: 4.66 MIL/uL (ref 3.87–5.11)
RDW: 25.6 % — ABNORMAL HIGH (ref 11.5–15.5)
WBC: 12.3 10*3/uL — ABNORMAL HIGH (ref 4.0–10.5)

## 2011-06-26 ENCOUNTER — Ambulatory Visit: Payer: Self-pay | Admitting: Internal Medicine

## 2011-07-27 ENCOUNTER — Ambulatory Visit (INDEPENDENT_AMBULATORY_CARE_PROVIDER_SITE_OTHER): Payer: Medicaid Other | Admitting: Obstetrics and Gynecology

## 2011-07-27 ENCOUNTER — Encounter: Payer: Self-pay | Admitting: Obstetrics and Gynecology

## 2011-07-27 VITALS — BP 136/77 | HR 102 | Temp 97.7°F | Ht 71.0 in | Wt 350.5 lb

## 2011-07-27 DIAGNOSIS — Z124 Encounter for screening for malignant neoplasm of cervix: Secondary | ICD-10-CM

## 2011-07-27 DIAGNOSIS — N939 Abnormal uterine and vaginal bleeding, unspecified: Secondary | ICD-10-CM

## 2011-07-27 DIAGNOSIS — D649 Anemia, unspecified: Secondary | ICD-10-CM

## 2011-07-27 DIAGNOSIS — N898 Other specified noninflammatory disorders of vagina: Secondary | ICD-10-CM

## 2011-07-27 DIAGNOSIS — N938 Other specified abnormal uterine and vaginal bleeding: Secondary | ICD-10-CM

## 2011-07-27 DIAGNOSIS — N949 Unspecified condition associated with female genital organs and menstrual cycle: Secondary | ICD-10-CM

## 2011-07-27 MED ORDER — INTEGRA F 125-1 MG PO CAPS: 1.0000 | ORAL_CAPSULE | Freq: Once | ORAL | Status: DC

## 2011-07-27 NOTE — ED Provider Notes (Addendum)
Stacey [name] [id number] y.A.[id number] @Unknown  Chief Complaint  Patient presents with  . follow up on Depo provera    SUBJECTIVE  HPI She presents for followup of abnormal bleeding. Today she has scant bleeding, mild cramps and no orthostatic sx. She was initially seen by [facility]10/2011 at which time she was reporting very heavy periods with intermenstrual spotting and postcoital spotting. She had orthostatic symptoms and a hemoglobin of 7.9. She was transfused 2 units packed RBCs and started on iron. She was then seen at GYN clinic 05/30/2011 and her hemoglobin was up to 10.2. Her ultrasound revealed a normal endometrium with thickness up to 9 mm. She had a normal Pap smear. Of note at both of the visits it was impossible to visualize her cervix using a large speculum. Dr. Okey Dupre felt that it would probably be impossible to do an endometrial biopsy in the clinic setting. He gave her Depo-Provera 150 mg IM and told her that if she had heavy bleeding still at the end of the November we would next consider doing a D&C. Since she got the Depo-Provera she has had heavy bleeding through mid November but it slowed down toward the end of November. In early December she had menstrual-like bleeding and this was the time for her menses to occur. After that the bleeding tapered and she's used 1-2 minipads pads a day. She's also using a lot of Advil. She would like to change from ferrous sulfate which she feels has caused her to have a truncal rash back to the Integra-F which she tolerated well. She is reluctant to proceed with any type of invasive or surgical management of her bleeding and would like to continue with medical management.   Past Medical History  Diagnosis Date  . Asthma   . Allergic rhinitis, cause unspecified   . Bipolar 1 disorder   . Morbid obesity   . OSA (obstructive sleep apnea)   . Thyroid nodule   . Mental disorder   . 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 Pt has never been  tested.   . Anxiety    Past Surgical History  Procedure Date  . Tubal ligation   . Cesarean section   . Noraplant    History   Social History  . Marital Status: Married    Spouse Name: N/A    Number of Children: N/A  . Years of Education: N/A   Occupational History  . Unemployed    Social History Main Topics  . Smoking status: Former Smoker -- 0.5 packs/day for 10 years    Types: Cigarettes    Quit date: 07/30/2006  . Smokeless tobacco: Never Used  . Alcohol Use: No  . Drug Use: No  . Sexually Active: Yes    Birth Control/ Protection: Surgical   Other Topics Concern  . Not on file   Social History Narrative  . No narrative on file   Current Outpatient Prescriptions on File Prior to Visit  Medication Sig Dispense Refill  . albuterol (PROVENTIL HFA;VENTOLIN HFA) 108 (90 BASE) MCG/ACT inhaler Inhale 2 puffs into the lungs every 6 (six) hours as needed.        . beclomethasone (QVAR) 40 MCG/ACT inhaler Inhale 2 puffs into the lungs 3 (three) times daily.       . cetirizine (ZYRTEC) 10 MG tablet Take 10 mg by mouth daily.        . diphenhydrAMINE (SOMINEX) 25 MG tablet Take 50 mg by mouth every 4 (  four) hours as needed.       Marland Kitchen EPINEPHrine (EPI-PEN) 0.3 mg/0.3 mL DEVI Inject 0.3 mg into the muscle once.        . escitalopram (LEXAPRO) 10 MG tablet Take 10 mg by mouth at bedtime.        . ferrous sulfate 325 (65 FE) MG tablet Take 325 mg by mouth daily with breakfast.        . gabapentin (NEURONTIN) 300 MG capsule Take 300 mg by mouth 3 (three) times daily.        Marland Kitchen guaiFENesin (MUCINEX) 600 MG 12 hr tablet Take 600 mg by mouth 2 (two) times daily.        Marland Kitchen lithium carbonate 300 MG capsule Take 300 mg by mouth 3 (three) times daily.       Marland Kitchen loratadine (CLARITIN) 10 MG tablet Take 10 mg by mouth daily.        Marland Kitchen LORazepam (ATIVAN) 1 MG tablet 1/2 to 1 three times a day as needed      . mometasone (NASONEX) 50 MCG/ACT nasal spray Place 1 spray into the nose daily.        .  multivitamin (THERAGRAN) per tablet Take 1 tablet by mouth daily.        . Olopatadine HCl 0.6 % SOLN Place 2 puffs into the nose at bedtime.  30.5 g  5  . pantoprazole (PROTONIX) 40 MG tablet Take 1 tablet (40 mg total) by mouth daily.  30 tablet  1  . pseudoephedrine (SUDAFED) 30 MG tablet Take 60 mg by mouth every 4 (four) hours as needed.        . calcium carbonate (TUMS - DOSED IN MG ELEMENTAL CALCIUM) 500 MG chewable tablet As needed after meals       . Cetylpyridinium Chloride (CEPACOL MOUTHWASH/GARGLE) 0.05 % LIQD Use as directed in the mouth or throat.        Marland Kitchen omeprazole (PRILOSEC) 20 MG capsule Take 20 mg by mouth daily.        Marland Kitchen topiramate (TOPAMAX) 100 MG tablet Take 100 mg by mouth 2 (two) times daily.        Marland Kitchen topiramate (TOPAMAX) 25 MG tablet Take 25 mg by mouth at bedtime.         Allergies  Allergen Reactions  . Bupropion Hcl     REACTION: swelling in hands  . Citalopram Hydrobromide   . Lamictal (Lamotrigine) Rash  . Latex Other (See Comments)    Blisters where there latex touched it    ROS: Pertinent items in HPI  OBJECTIVE  BP 136/77  Pulse 102  Temp(Src) 97.7 F (36.5 C) (Oral)  Ht 5\' 11"  (1.803 m)  Wt 350 lb 8 oz (158.986 kg)  BMI 48.88 kg/m2  LMP 05/30/2011    ASSESSMENT   Abnormal uterine bleeding with some improvement after Depo-Provera Morbid obesity Bipolar disorder  PLAN In consultation with Dr. Debroah Loop plan is to repeat the ultrasound in 2-4 weeks to evaluate the endometrium and return here the end of January when it will be time for another Depo-Provera injections. Rx for Integra-F and ibuprofen 600 mg every 6 hours for cramps.

## 2011-07-27 NOTE — Progress Notes (Signed)
Stacey [name] [id number]  Chief Complaint  Patient presents with  . follow up on Depo provera    SUBJECTIVE  HPI:  Past Medical History  Diagnosis Date  . Asthma   . Allergic rhinitis, cause unspecified   . Bipolar 1 disorder   . Morbid obesity   . OSA (obstructive sleep apnea)   . Thyroid nodule   . Mental disorder   . 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 Pt has never been tested.   . Anxiety    Past Surgical History  Procedure Date  . Tubal ligation   . Cesarean section   . Noraplant    History   Social History  . Marital Status: Married    Spouse Name: N/A    Number of Children: N/A  . Years of Education: N/A   Occupational History  . Unemployed    Social History Main Topics  . Smoking status: Former Smoker -- 0.5 packs/day for 10 years    Types: Cigarettes    Quit date: 07/30/2006  . Smokeless tobacco: Never Used  . Alcohol Use: No  . Drug Use: No  . Sexually Active: Yes    Birth Control/ Protection: Surgical   Other Topics Concern  . Not on file   Social History Narrative  . No narrative on file   Current Outpatient Prescriptions on File Prior to Visit  Medication Sig Dispense Refill  . albuterol (PROVENTIL HFA;VENTOLIN HFA) 108 (90 BASE) MCG/ACT inhaler Inhale 2 puffs into the lungs every 6 (six) hours as needed.        . beclomethasone (QVAR) 40 MCG/ACT inhaler Inhale 2 puffs into the lungs 3 (three) times daily.       . cetirizine (ZYRTEC) 10 MG tablet Take 10 mg by mouth daily.        . diphenhydrAMINE (SOMINEX) 25 MG tablet Take 50 mg by mouth every 4 (four) hours as needed.       Marland Kitchen EPINEPHrine (EPI-PEN) 0.3 mg/0.3 mL DEVI Inject 0.3 mg into the muscle once.        . escitalopram (LEXAPRO) 10 MG tablet Take 10 mg by mouth at bedtime.        . ferrous sulfate 325 (65 FE) MG tablet Take 325 mg by mouth daily with breakfast.        . gabapentin (NEURONTIN) 300 MG capsule Take 300 mg by mouth 3 (three) times daily.        Marland Kitchen  guaiFENesin (MUCINEX) 600 MG 12 hr tablet Take 600 mg by mouth 2 (two) times daily.        Marland Kitchen lithium carbonate 300 MG capsule Take 300 mg by mouth 3 (three) times daily.       Marland Kitchen loratadine (CLARITIN) 10 MG tablet Take 10 mg by mouth daily.        Marland Kitchen LORazepam (ATIVAN) 1 MG tablet 1/2 to 1 three times a day as needed      . mometasone (NASONEX) 50 MCG/ACT nasal spray Place 1 spray into the nose daily.        . multivitamin (THERAGRAN) per tablet Take 1 tablet by mouth daily.        . Olopatadine HCl 0.6 % SOLN Place 2 puffs into the nose at bedtime.  30.5 g  5  . pantoprazole (PROTONIX) 40 MG tablet Take 1 tablet (40 mg total) by mouth daily.  30 tablet  1  . pseudoephedrine (SUDAFED) 30 MG tablet Take 60 mg by mouth  every 4 (four) hours as needed.        . calcium carbonate (TUMS - DOSED IN MG ELEMENTAL CALCIUM) 500 MG chewable tablet As needed after meals       . Cetylpyridinium Chloride (CEPACOL MOUTHWASH/GARGLE) 0.05 % LIQD Use as directed in the mouth or throat.        . Fe Fum-FePoly-FA-Vit C-Vit B3 (INTEGRA F) 125-1 MG CAPS Take 1 tablet by mouth once.  16 capsule  0  . omeprazole (PRILOSEC) 20 MG capsule Take 20 mg by mouth daily.        Marland Kitchen topiramate (TOPAMAX) 100 MG tablet Take 100 mg by mouth 2 (two) times daily.        Marland Kitchen topiramate (TOPAMAX) 25 MG tablet Take 25 mg by mouth at bedtime.         Allergies  Allergen Reactions  . Bupropion Hcl     REACTION: swelling in hands  . Citalopram Hydrobromide   . Lamictal (Lamotrigine) Rash  . Latex Other (See Comments)    Blisters where there latex touched it    ROS: Pertinent items in HPI  OBJECTIVE  BP 136/77  Pulse 102  Temp(Src) 97.7 F (36.5 C) (Oral)  Ht 5\' 11"  (1.803 m)  Wt 350 lb 8 oz (158.986 kg)  BMI 48.88 kg/m2  LMP 05/30/2011    ASSESSMENT     PLAN

## 2011-07-27 NOTE — Patient Instructions (Signed)
Abnormal Uterine Bleeding Abnormal uterine bleeding can have many causes. Some cases are simply treated, while others are more serious. There are several kinds of bleeding that is considered abnormal, including:  Bleeding between periods.   Bleeding after sexual intercourse.   Spotting anytime in the menstrual cycle.   Bleeding heavier or more than normal.   Bleeding after menopause.  CAUSES  There are many causes of abnormal uterine bleeding. It can be present in teenagers, pregnant women, women during their reproductive years, and women who have reached menopause. Your caregiver will look for the more common causes depending on your age, signs, symptoms and your particular circumstance. Most cases are not serious and can be treated. Even the more serious causes, like cancer of the female organs, can be treated adequately if found in the early stages. That is why all types of bleeding should be evaluated and treated as soon as possible. DIAGNOSIS  Diagnosing the cause may take several kinds of tests. Your caregiver may:  Take a complete history of the type of bleeding.   Perform a complete physical exam and Pap smear.   Take an ultrasound on the abdomen showing a picture of the female organs and the pelvis.   Inject dye into the uterus and Fallopian tubes and X-ray them (hysterosalpingogram).   Place fluid in the uterus and do an ultrasound (sonohysterogrqphy).   Take a CT scan to examine the female organs and pelvis.   Take an MRI to examine the female organs and pelvis. There is no X-ray involved with this procedure.   Look inside the uterus with a telescope that has a light at the end (hysteroscopy).   Scrap the inside of the uterus to get tissue to examine (Dilatation and Curettage, D&C).   Look into the pelvis with a telescope that has a light at the end (laparoscopy). This is done through a very small cut (incision) in the abdomen.  TREATMENT  Treatment will depend on the  cause of the abnormal bleeding. It can include:  Doing nothing to allow the problem to take care of itself over time.   Hormone treatment.   Birth control pills.   Treating the medical condition causing the problem.   Laparoscopy.   Major or minor surgery   Destroying the lining of the uterus with electrical currant, laser, freezing or heat (uterine ablation).  HOME CARE INSTRUCTIONS   Follow your caregiver's recommendation on how to treat your problem.   See your caregiver if you missed a menstrual period and think you may be pregnant.   If you are bleeding heavily, count the number of pads/tampons you use and how often you have to change them. Tell this to your caregiver.   Avoid sexual intercourse until the problem is controlled.  SEEK MEDICAL CARE IF:   You have any kind of abnormal bleeding mentioned above.   You feel dizzy at times.   You are 45 years old and have not had a menstrual period yet.  SEEK IMMEDIATE MEDICAL CARE IF:   You pass out.   You are changing pads/tampons every 15 to 30 minutes.   You have belly (abdominal) pain.   You have a temperature of 100 F (37.8 C) or higher.   You become sweaty or weak.   You are passing large blood clots from the vagina.   You start to feel sick to your stomach (nauseous) and throw up (vomit).  Document Released: 07/16/2005 Document Revised: 03/28/2011 Document Reviewed: 12/09/2008 ExitCare  Patient Information 2012 [place], [place].Fluphenazine depot injection What is this medicine? FLUPHENAZINE (floo FEN a zeen) is used to treat the symptoms of schizophrenia and other mental disorders. Not all fluphenazine injections are the same. Do not change the brand you are using without checking with your doctor or health care professional. This medicine may be used for other purposes; ask your health care provider or pharmacist if you have questions. What should I tell my health care provider before I take this  medicine? They need to know if you have any of these conditions: -blood disorders or disease -dementia -head injury -heart disease -liver disease -Parkinson's disease -Reye's syndrome -uncontrollable movement disorder -an unusual or allergic reaction to fluphenazine, other medicines foods, dyes, or preservatives -pregnant or trying to get pregnant -breast-feeding How should I use this medicine? This medicine is for injection into a muscle or under the skin. It is given by a health-care professional in a hospital or clinic setting. Talk to your pediatrician regarding the use of this medicine in children. Special care may be needed. Overdosage: If you think you have taken too much of this medicine contact a poison control center or emergency room at once. NOTE: This medicine is only for you. Do not share this medicine with others. What if I miss a dose? This does not apply. What may interact with this medicine? Do not take this medicine with any of the following medications: -amoxapine -certain antibiotics like gatifloxacin, grepafloxacin, sparfloxacin -cisapride -clozapine -dofetilide -droperidol -ephedrine -medicines for mental depression like escitalopram, fluoxetine, paroxetine, sertraline -ibutilide -levomethadyl -maprotiline -other phenothiazines like chlorpromazine, mesoridazine, prochlorperazine, and thioridazine -pimozide -pindolol -propranolol -risperidone -sotalol -trimethobenzamide -ziprasidone This medicine may also interact with the following medications: -atropine -some medications for high blood pressure or heart problems This list may not describe all possible interactions. Give your health care provider a list of all the medicines, herbs, non-prescription drugs, or dietary supplements you use. Also tell them if you smoke, drink alcohol, or use illegal drugs. Some items may interact with your medicine. What should I watch for while using this medicine? You  may get drowsy or dizzy. Do not drive, use machinery, or do anything that needs mental alertness until you know how this medicine affects you. Do not stand or sit up quickly, especially if you are an older patient. This reduces the risk of dizzy or fainting spells. Alcohol may interfere with the effect of this medicine. Avoid alcoholic drinks. This medicine can reduce the response of your body to heat or cold. Try not to get overheated. Avoid temperature extremes, such as saunas, hot tubs, or very hot or cold baths or showers. Dress warmly in cold weather. This medicine can make you more sensitive to the sun. Keep out of the sun. If you cannot avoid being in the sun, wear protective clothing and use sunscreen. Do not use sun lamps or tanning beds/booths. Your mouth may get dry. Chewing sugarless gum or sucking hard candy, and drinking plenty of water may help. Contact your doctor if the problem does not go away or is severe. If you are going to have surgery, tell your doctor or health care professional that you are taking this medicine. What side effects may I notice from receiving this medicine? Side effects that you should report to your doctor or health care professional as soon as possible: -allergic reactions like skin rash, itching or hives, swelling of the face, lips, or tongue -blurred vision -breast enlargement in men or women -breast milk  in women who are not breast-feeding -chest pain, fast or irregular heartbeat -confusion, restlessness -dark yellow or brown urine -difficulty breathing or swallowing -dizziness or fainting spells -drooling, shaking -fever, chills, sore throat -involuntary or uncontrollable movements of the eyes, mouth, head, arms, and legs -seizures -stomach area pain -unusual bleeding or bruising -unusually weak or tired -yellowing of skin or eyes Side effects that usually do not require medical attention (report to your doctor or health care professional if they  continue or are bothersome): -difficulty passing urine -difficulty sleeping -headache -sexual dysfunction This list may not describe all possible side effects. Call your doctor for medical advice about side effects. You may report side effects to FDA at 1-800-FDA-1088. Where should I keep my medicine? This drug is given in a hospital or clinic and will not be stored at home. NOTE: This sheet is a summary. It may not cover all possible information. If you have questions about this medicine, talk to your doctor, pharmacist, or health care provider.  2012, [employer]. (11/24/2007 4:14:15 PM)

## 2011-08-20 ENCOUNTER — Ambulatory Visit (HOSPITAL_COMMUNITY)
Admission: RE | Admit: 2011-08-20 | Discharge: 2011-08-20 | Disposition: A | Discharge status: Home / Self Care | Payer: Medicaid Other | Source: Ambulatory Visit | Attending: Obstetrics and Gynecology | Admitting: Obstetrics and Gynecology

## 2011-08-20 ENCOUNTER — Other Ambulatory Visit: Payer: Self-pay | Admitting: Family Medicine

## 2011-08-20 DIAGNOSIS — N939 Abnormal uterine and vaginal bleeding, unspecified: Secondary | ICD-10-CM

## 2011-08-20 DIAGNOSIS — N938 Other specified abnormal uterine and vaginal bleeding: Secondary | ICD-10-CM | POA: Insufficient documentation

## 2011-08-20 DIAGNOSIS — Z1231 Encounter for screening mammogram for malignant neoplasm of breast: Secondary | ICD-10-CM

## 2011-08-20 DIAGNOSIS — N949 Unspecified condition associated with female genital organs and menstrual cycle: Secondary | ICD-10-CM | POA: Insufficient documentation

## 2011-08-30 ENCOUNTER — Encounter: Payer: Self-pay | Admitting: Advanced Practice Midwife

## 2011-08-30 ENCOUNTER — Other Ambulatory Visit (HOSPITAL_COMMUNITY)
Admission: RE | Admit: 2011-08-30 | Discharge: 2011-08-30 | Disposition: A | Discharge status: Home / Self Care | Payer: Medicaid Other | Source: Ambulatory Visit | Attending: Advanced Practice Midwife | Admitting: Advanced Practice Midwife

## 2011-08-30 ENCOUNTER — Ambulatory Visit (INDEPENDENT_AMBULATORY_CARE_PROVIDER_SITE_OTHER): Payer: Medicaid Other | Admitting: Advanced Practice Midwife

## 2011-08-30 VITALS — BP 144/85 | HR 105 | Temp 98.0°F | Ht 70.25 in | Wt 353.8 lb

## 2011-08-30 DIAGNOSIS — N921 Excessive and frequent menstruation with irregular cycle: Secondary | ICD-10-CM

## 2011-08-30 DIAGNOSIS — N920 Excessive and frequent menstruation with regular cycle: Secondary | ICD-10-CM | POA: Insufficient documentation

## 2011-08-30 MED ORDER — MEDROXYPROGESTERONE ACETATE 150 MG/ML IM SUSP
150.0000 mg | Freq: Once | INTRAMUSCULAR | Status: AC
  Administered 2011-08-30: 150 mg via INTRAMUSCULAR

## 2011-08-30 NOTE — Progress Notes (Signed)
F/U visit from [facility] (Est clinic pt) for further eval of menometrorrhagia and Depo. Bleeding stopped three months ago after bleeding for 3 months necessitating transfusion in October. Pt states Hbg checked at PCP three weeks ago was normal. Pt has not had EBX up to this point because of difficulty visualizing cervix. Pap obtained w/ difficulty 10/12 was neg. Transformation zone present. [place] 08/20/11 6mm endometrium. Normal.    Endometrial Biopsy Procedure Note  Pre-operative Diagnosis: Menometrorrhagia  Post-operative Diagnosis: same  Indications: abnormal uterine bleeding  Procedure Details   Urine pregnancy test was done 08/30/11 and result was negative.  The risks (including infection, bleeding, pain, and uterine perforation) and benefits of the procedure were explained to the patient and Written informed consent was obtained.  Antibiotic prophylaxis against endocarditis was not indicated.   The patient was placed in the dorsal lithotomy position.  Bimanual exam showed the uterus to be in the retroflexed position, cervix anterior.  A large graves' speculum inserted in the vagina, and the cervix prepped with povidone iodine.  Endocervical curettage with a Kevorkian curette was not performed.   A sharp tenaculum was applied to the anterior lip of the cervix for stabilization.  A sterile uterine sound was used to sound the uterus to a depth of 8cm.  A Pipelle endometrial aspirator was used to sample the endometrium.  Sample was sent for pathologic examination.  Condition: Stable  Complications: None  Plan:  The patient was advised to call for any fever or for prolonged or severe pain or bleeding. She was advised to use OTC ibuprofen as needed for mild to moderate pain. She was advised to avoid vaginal intercourse for 48 hours or until the bleeding has completely stopped.  Attending Physician Documentation: I was performed the procedure.  Will call pr w/ results and schedule F/U PRN for  abnormal results. Otherwise, F/U for Depo in 12 weeks. Pt prefers to continue medical management of DUB at this time.   Dorathy Kinsman 08/30/2011 5:24 PM

## 2011-09-05 ENCOUNTER — Ambulatory Visit
Admission: RE | Admit: 2011-09-05 | Discharge: 2011-09-05 | Disposition: A | Discharge status: Home / Self Care | Payer: Medicaid Other | Source: Ambulatory Visit | Attending: Family Medicine | Admitting: Family Medicine

## 2011-09-05 DIAGNOSIS — Z1231 Encounter for screening mammogram for malignant neoplasm of breast: Secondary | ICD-10-CM

## 2011-11-15 ENCOUNTER — Ambulatory Visit (INDEPENDENT_AMBULATORY_CARE_PROVIDER_SITE_OTHER): Payer: Medicaid Other | Admitting: *Deleted

## 2011-11-15 VITALS — BP 130/87 | HR 97 | Temp 98.7°F

## 2011-11-15 DIAGNOSIS — N939 Abnormal uterine and vaginal bleeding, unspecified: Secondary | ICD-10-CM

## 2011-11-15 DIAGNOSIS — N949 Unspecified condition associated with female genital organs and menstrual cycle: Secondary | ICD-10-CM

## 2011-11-15 MED ORDER — MEDROXYPROGESTERONE ACETATE 150 MG/ML IM SUSP
150.0000 mg | Freq: Once | INTRAMUSCULAR | Status: AC
  Administered 2011-11-15: 150 mg via INTRAMUSCULAR

## 2011-11-15 NOTE — Progress Notes (Signed)
Patient here for depoprovera- states shot not helping a lot- wants to stop- informed patient appointment today for shot only, but can schedule doctor visit to discuss plan of care including other medical options and /or surgical- also discussed this will be 3rd shot so may be best to take shot today to help bleeding to not increase until has doctor appointment since appointment may be a few weeks away. Patient states having some depression but saw her therapist today. Patient elected to get shot today and d/c  After this.

## 2011-12-12 ENCOUNTER — Ambulatory Visit: Payer: Medicaid Other | Admitting: Family

## 2012-09-04 ENCOUNTER — Encounter (HOSPITAL_COMMUNITY): Payer: Self-pay | Admitting: *Deleted

## 2012-09-04 ENCOUNTER — Emergency Department (HOSPITAL_COMMUNITY): Payer: Medicaid Other

## 2012-09-04 ENCOUNTER — Emergency Department (HOSPITAL_COMMUNITY)
Admission: EM | Admit: 2012-09-04 | Discharge: 2012-09-04 | Disposition: A | Discharge status: Home / Self Care | Payer: Medicaid Other | Attending: Emergency Medicine | Admitting: Emergency Medicine

## 2012-09-04 ENCOUNTER — Other Ambulatory Visit: Payer: Self-pay

## 2012-09-04 DIAGNOSIS — F319 Bipolar disorder, unspecified: Secondary | ICD-10-CM | POA: Insufficient documentation

## 2012-09-04 DIAGNOSIS — R0789 Other chest pain: Secondary | ICD-10-CM | POA: Insufficient documentation

## 2012-09-04 DIAGNOSIS — R110 Nausea: Secondary | ICD-10-CM

## 2012-09-04 DIAGNOSIS — Z79899 Other long term (current) drug therapy: Secondary | ICD-10-CM | POA: Insufficient documentation

## 2012-09-04 DIAGNOSIS — Z8639 Personal history of other endocrine, nutritional and metabolic disease: Secondary | ICD-10-CM | POA: Insufficient documentation

## 2012-09-04 DIAGNOSIS — J45909 Unspecified asthma, uncomplicated: Secondary | ICD-10-CM | POA: Insufficient documentation

## 2012-09-04 DIAGNOSIS — Z87891 Personal history of nicotine dependence: Secondary | ICD-10-CM | POA: Insufficient documentation

## 2012-09-04 DIAGNOSIS — R112 Nausea with vomiting, unspecified: Secondary | ICD-10-CM | POA: Insufficient documentation

## 2012-09-04 DIAGNOSIS — F411 Generalized anxiety disorder: Secondary | ICD-10-CM | POA: Insufficient documentation

## 2012-09-04 DIAGNOSIS — Z862 Personal history of diseases of the blood and blood-forming organs and certain disorders involving the immune mechanism: Secondary | ICD-10-CM | POA: Insufficient documentation

## 2012-09-04 DIAGNOSIS — R197 Diarrhea, unspecified: Secondary | ICD-10-CM | POA: Insufficient documentation

## 2012-09-04 DIAGNOSIS — Z8669 Personal history of other diseases of the nervous system and sense organs: Secondary | ICD-10-CM | POA: Insufficient documentation

## 2012-09-04 LAB — COMPREHENSIVE METABOLIC PANEL
AST: 52 U/L — ABNORMAL HIGH (ref 0–37)
Albumin: 3.6 g/dL (ref 3.5–5.2)
Calcium: 8.4 mg/dL (ref 8.4–10.5)
Creatinine, Ser: 0.67 mg/dL (ref 0.50–1.10)
Total Protein: 8.6 g/dL — ABNORMAL HIGH (ref 6.0–8.3)

## 2012-09-04 LAB — URINALYSIS, ROUTINE W REFLEX MICROSCOPIC
Glucose, UA: NEGATIVE mg/dL
Hgb urine dipstick: NEGATIVE
Specific Gravity, Urine: 1.006 (ref 1.005–1.030)
Urobilinogen, UA: 0.2 mg/dL (ref 0.0–1.0)
pH: 6.5 (ref 5.0–8.0)

## 2012-09-04 LAB — CBC
HCT: 34.9 % — ABNORMAL LOW (ref 36.0–46.0)
Hemoglobin: 10.3 g/dL — ABNORMAL LOW (ref 12.0–15.0)
MCHC: 29.5 g/dL — ABNORMAL LOW (ref 30.0–36.0)
Platelets: 457 10*3/uL — ABNORMAL HIGH (ref 150–400)
RBC: 4.68 MIL/uL (ref 3.87–5.11)
WBC: 13 10*3/uL — ABNORMAL HIGH (ref 4.0–10.5)

## 2012-09-04 LAB — POCT I-STAT TROPONIN I: Troponin i, poc: 0.05 ng/mL (ref 0.00–0.08)

## 2012-09-04 LAB — URINE MICROSCOPIC-ADD ON

## 2012-09-04 MED ORDER — SODIUM CHLORIDE 0.9 % IV SOLN: 1000.0000 mL | Freq: Once | INTRAVENOUS | Status: DC

## 2012-09-04 MED ORDER — ONDANSETRON 4 MG PO TBDP
8.0000 mg | ORAL_TABLET | Freq: Once | ORAL | Status: AC
  Administered 2012-09-04: 8 mg via ORAL
  Filled 2012-09-04: qty 2

## 2012-09-04 MED ORDER — SODIUM CHLORIDE 0.9 % IV SOLN: 1000.0000 mL | INTRAVENOUS | Status: DC

## 2012-09-04 MED ORDER — ONDANSETRON HCL 4 MG PO TABS: 4.0000 mg | ORAL_TABLET | Freq: Four times a day (QID) | ORAL | Status: DC

## 2012-09-04 NOTE — ED Notes (Signed)
Patient tearful and anxious.   Patient states "I just feel like I'm going to pass out".    Patient is laying in bed, with both rails up and stretcher in locked position.  Family at bedside.

## 2012-09-04 NOTE — ED Provider Notes (Signed)
History     CSN: [id number]  Arrival date & time 09/04/12  0137   First MD Initiated Contact with Patient 09/04/12 0405      Chief Complaint  Patient presents with  . Chest Pain    (Consider location/radiation/quality/duration/timing/severity/associated sxs/prior treatment) HPI 47 yo female presents to the ER from home with complaint of recent n/v/d with other family members with same sxs.  Sxs started about 10 days ago, with last emesis 7 days ago.  She has had persistent nausea and diarrhea.  Pt has had 3-4 days of left sided chest tenderness and occasional tremors.  She has not been taking any of her psychiatric medications since onset of the illness.  She is concerned that she may be going through withdrawal from her meds.  She is also concerned about dehydration.  She is able to tolerate crackers and water.  She is a vegan and when she tried to eat quinoa earlier today she reports "I feel it going up and down from my stomach to my throat" Past Medical History  Diagnosis Date  . Asthma   . Allergic rhinitis, cause unspecified   . Bipolar 1 disorder   . Morbid obesity   . OSA (obstructive sleep apnea)   . Thyroid nodule   . Mental disorder   . 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 Pt has never been tested.   . Anxiety     Past Surgical History  Procedure Date  . Tubal ligation   . Cesarean section   . Noraplant     Family History  Problem Relation Age of Onset  . Breast cancer Mother   . Emphysema Paternal Grandmother   . Diabetes Father   . Asthma Mother     History  Substance Use Topics  . Smoking status: Former Smoker -- 0.5 packs/day for 10 years    Types: Cigarettes    Quit date: 07/30/2006  . Smokeless tobacco: Never Used  . Alcohol Use: No    OB History    Grav Para Term Preterm Abortions TAB SAB Ect Mult Living   10 6 6  4  4   6       Review of Systems  All other systems reviewed and are negative.    Allergies  Bupropion hcl; Citalopram  hydrobromide; Lamictal; and Latex  Home Medications   Current Outpatient Rx  Name  Route  Sig  Dispense  Refill  . ALBUTEROL SULFATE HFA 108 (90 BASE) MCG/ACT IN AERS   Inhalation   Inhale 2 puffs into the lungs every 6 (six) hours as needed.           . BECLOMETHASONE DIPROPIONATE 40 MCG/ACT IN AERS   Inhalation   Inhale 2 puffs into the lungs 3 (three) times daily.          . CETYLPYRIDINIUM CHLORIDE 0.05 % MT LIQD   Mouth/Throat   Use as directed in the mouth or throat.           Marland Kitchen DIPHENHYDRAMINE HCL (SLEEP) 25 MG PO TABS   Oral   Take 50 mg by mouth every 4 (four) hours as needed.          Marland Kitchen ESCITALOPRAM OXALATE 10 MG PO TABS   Oral   Take 20 mg by mouth at bedtime.          Marland Kitchen FLUTICASONE PROPIONATE 50 MCG/ACT NA SUSP   Nasal   Place 2 sprays into the nose daily.         Marland Kitchen  GABAPENTIN 300 MG PO CAPS   Oral   Take 300 mg by mouth 3 (three) times daily.           Marland Kitchen LITHIUM CARBONATE 300 MG PO CAPS   Oral   Take 300 mg by mouth 3 (three) times daily.          Marland Kitchen LORAZEPAM 1 MG PO TABS   Oral   Take 1 mg by mouth every 8 (eight) hours as needed. For anxiety         . OMEPRAZOLE 20 MG PO CPDR   Oral   Take 20 mg by mouth daily.           Marland Kitchen RIZATRIPTAN BENZOATE 10 MG PO TBDP   Oral   Take 10 mg by mouth daily as needed. May repeat in 2 hours if needed for migraines         . TRAMADOL HCL 50 MG PO TABS   Oral   Take 50 mg by mouth every 6 (six) hours as needed.         Marland Kitchen EPINEPHRINE 0.3 MG/0.3ML IJ DEVI   Intramuscular   Inject 0.3 mg into the muscle as needed. For allergic reaction         . ONDANSETRON HCL 4 MG PO TABS   Oral   Take 1 tablet (4 mg total) by mouth every 6 (six) hours. Prn nausea   20 tablet   0   . PANTOPRAZOLE SODIUM 40 MG PO TBEC   Oral   Take 1 tablet (40 mg total) by mouth daily.   30 tablet   1     BP 109/44  Pulse 89  Temp 99.5 F (37.5 C) (Oral)  Resp 21  SpO2 96%  Physical Exam   Constitutional: She is oriented to person, place, and time. She appears well-developed and well-nourished. She appears distressed (anxious, tearful).       Morbid obesity  HENT:  Head: Normocephalic and atraumatic.  Nose: Nose normal.  Mouth/Throat: Oropharynx is clear and moist.       Dry mucous membranes  Eyes: Conjunctivae normal and EOM are normal. Pupils are equal, round, and reactive to light.  Neck: Normal range of motion. Neck supple. No JVD present. No tracheal deviation present. No thyromegaly present.  Cardiovascular: Normal rate, regular rhythm, normal heart sounds and intact distal pulses.  Exam reveals no gallop and no friction rub.   No murmur heard. Pulmonary/Chest: Effort normal and breath sounds normal. No stridor. No respiratory distress. She has no wheezes. She has no rales. She exhibits tenderness (ttp over left breast, no overlying skin changes).  Abdominal: Soft. Bowel sounds are normal. She exhibits no distension and no mass. There is no tenderness. There is no rebound and no guarding.  Musculoskeletal: Normal range of motion. She exhibits no edema and no tenderness.  Lymphadenopathy:    She has no cervical adenopathy.  Neurological: She is alert and oriented to person, place, and time. She exhibits normal muscle tone. Coordination normal.  Skin: Skin is dry. No rash noted. No erythema. No pallor.  Psychiatric: Her behavior is normal. Judgment and thought content normal.       Pt with labile mood, laughing at one moment, crying at next.  No reported SI.  Reports depression, anxiety    ED Course  Procedures (including critical care time)  Labs Reviewed  CBC - Abnormal; Notable for the following:    WBC 13.0 (*)     Hemoglobin  10.3 (*)     HCT 34.9 (*)     MCV 74.6 (*)     MCH 22.0 (*)     MCHC 29.5 (*)     RDW 16.9 (*)     Platelets 457 (*)     All other components within normal limits  COMPREHENSIVE METABOLIC PANEL - Abnormal; Notable for the following:     Glucose, Bld 146 (*)     BUN 4 (*)     Total Protein 8.6 (*)     AST 52 (*)     All other components within normal limits  URINALYSIS, ROUTINE W REFLEX MICROSCOPIC - Abnormal; Notable for the following:    Leukocytes, UA TRACE (*)     All other components within normal limits  URINE MICROSCOPIC-ADD ON - Abnormal; Notable for the following:    Squamous Epithelial / LPF FEW (*)     Bacteria, UA FEW (*)     All other components within normal limits  POCT I-STAT TROPONIN I   Dg Chest 2 View  09/04/2012  *RADIOLOGY REPORT*  Clinical Data: Chest pain.  CHEST - 2 VIEW  Comparison: 04/26/2011  Findings: The heart size and pulmonary vascularity are normal. The lungs appear clear and expanded without focal air space disease or consolidation. No blunting of the costophrenic angles.  No pneumothorax.  Mediastinal contours appear intact.  IMPRESSION: No evidence of active pulmonary disease.  No significant interval change.   Original Report Authenticated By: Burman Nieves, M.D.     Date: 09/04/2012  Rate: 123  Rhythm: sinus tachycardia  QRS Axis: normal  Intervals: normal  ST/T Wave abnormalities: normal  Conduction Disutrbances:none  Narrative Interpretation:   Old EKG Reviewed: none available   1. Nausea   2. Atypical chest pain       MDM  47 year old female with recent episode of nausea and vomiting still with some persistent nausea but no vomiting in over a week. Patient reports she has not been taking her psychiatric medications as she has felt nauseated. She is concerned that she may be withdrawing. No signs of tachycardia after initial triage, no orthostatic hypotension, hypertension or other signs of benzodiazepine withdrawal. Patient reports she does not wish to restart any of her medicines at this time, and wishes to follow up with her primary care Dr. and restart a new regimen. Patient has refused an IV, and wishes to go home. She has tolerated by mouth fluids and crackers and  ginger ale. Will discharge with Zofran       Olivia Mackie, MD 09/04/12 [phone]

## 2012-09-04 NOTE — ED Notes (Signed)
Lynden Oxford, MD of patient refusal of IV and fluid.  Will fluid challenge PO.

## 2012-09-04 NOTE — ED Notes (Signed)
Patient aware that we need a urine sample.  Patient claims cannot provide one at this time.

## 2012-09-04 NOTE — ED Notes (Signed)
20 G L FA successful attempt, but as soon as I got the line in, patient started crying and stating "I can just drink the water, can you please take it out.  My fibromyalgia hurts all the way up my arm.  Did not get line taped up- went ahead and took out per patient insistence.

## 2012-09-04 NOTE — ED Notes (Signed)
Pt reports having norovirus for past 10 days and not taking her psychotropic meds. States that she started to have left side chest pain and generalized tremors. Pt concerned that she may be withdrawing from her meds or may be dehydrated. Pt states husband has been sick and has been unable to take her to her PCP. Pt anxious and tearful at this time.

## 2012-09-25 ENCOUNTER — Other Ambulatory Visit: Payer: Self-pay | Admitting: Family Medicine

## 2012-09-25 DIAGNOSIS — R42 Dizziness and giddiness: Secondary | ICD-10-CM

## 2012-09-28 ENCOUNTER — Ambulatory Visit
Admission: RE | Admit: 2012-09-28 | Discharge: 2012-09-28 | Disposition: A | Discharge status: Home / Self Care | Payer: Medicaid Other | Source: Ambulatory Visit | Attending: Family Medicine | Admitting: Family Medicine

## 2012-10-30 ENCOUNTER — Ambulatory Visit: Payer: Self-pay | Admitting: Neurology

## 2012-11-17 ENCOUNTER — Ambulatory Visit: Payer: Self-pay | Admitting: Neurology

## 2012-11-21 ENCOUNTER — Ambulatory Visit: Payer: Self-pay | Admitting: Neurology

## 2012-11-24 ENCOUNTER — Emergency Department (HOSPITAL_COMMUNITY): Payer: Medicaid Other

## 2012-11-24 ENCOUNTER — Emergency Department (HOSPITAL_COMMUNITY)
Admission: EM | Admit: 2012-11-24 | Discharge: 2012-11-24 | Disposition: A | Discharge status: Home / Self Care | Payer: Medicaid Other | Attending: Emergency Medicine | Admitting: Emergency Medicine

## 2012-11-24 ENCOUNTER — Encounter (HOSPITAL_COMMUNITY): Payer: Self-pay | Admitting: *Deleted

## 2012-11-24 DIAGNOSIS — F319 Bipolar disorder, unspecified: Secondary | ICD-10-CM | POA: Insufficient documentation

## 2012-11-24 DIAGNOSIS — Z7982 Long term (current) use of aspirin: Secondary | ICD-10-CM | POA: Insufficient documentation

## 2012-11-24 DIAGNOSIS — K922 Gastrointestinal hemorrhage, unspecified: Secondary | ICD-10-CM

## 2012-11-24 DIAGNOSIS — R061 Stridor: Secondary | ICD-10-CM | POA: Insufficient documentation

## 2012-11-24 DIAGNOSIS — R0602 Shortness of breath: Secondary | ICD-10-CM | POA: Insufficient documentation

## 2012-11-24 DIAGNOSIS — Z79899 Other long term (current) drug therapy: Secondary | ICD-10-CM | POA: Insufficient documentation

## 2012-11-24 DIAGNOSIS — Z862 Personal history of diseases of the blood and blood-forming organs and certain disorders involving the immune mechanism: Secondary | ICD-10-CM | POA: Insufficient documentation

## 2012-11-24 DIAGNOSIS — F411 Generalized anxiety disorder: Secondary | ICD-10-CM | POA: Insufficient documentation

## 2012-11-24 DIAGNOSIS — Z87891 Personal history of nicotine dependence: Secondary | ICD-10-CM | POA: Insufficient documentation

## 2012-11-24 DIAGNOSIS — J45909 Unspecified asthma, uncomplicated: Secondary | ICD-10-CM | POA: Insufficient documentation

## 2012-11-24 DIAGNOSIS — Z8639 Personal history of other endocrine, nutritional and metabolic disease: Secondary | ICD-10-CM | POA: Insufficient documentation

## 2012-11-24 DIAGNOSIS — D649 Anemia, unspecified: Secondary | ICD-10-CM

## 2012-11-24 DIAGNOSIS — Z8669 Personal history of other diseases of the nervous system and sense organs: Secondary | ICD-10-CM | POA: Insufficient documentation

## 2012-11-24 DIAGNOSIS — R5381 Other malaise: Secondary | ICD-10-CM | POA: Insufficient documentation

## 2012-11-24 DIAGNOSIS — F419 Anxiety disorder, unspecified: Secondary | ICD-10-CM

## 2012-11-24 DIAGNOSIS — R21 Rash and other nonspecific skin eruption: Secondary | ICD-10-CM | POA: Insufficient documentation

## 2012-11-24 DIAGNOSIS — Z8709 Personal history of other diseases of the respiratory system: Secondary | ICD-10-CM | POA: Insufficient documentation

## 2012-11-24 LAB — BASIC METABOLIC PANEL
BUN: 11 mg/dL (ref 6–23)
Calcium: 9.3 mg/dL (ref 8.4–10.5)
GFR calc non Af Amer: >90 mL/min (ref >90)
Glucose, Bld: 129 mg/dL — ABNORMAL HIGH (ref 70–99)
Sodium: 137 mEq/L (ref 135–145)

## 2012-11-24 LAB — BASIC METABOLIC PANEL WITH GFR
CO2: 26 meq/L (ref 19–32)
Chloride: 102 meq/L (ref 96–112)
Creatinine, Ser: 0.64 mg/dL (ref 0.50–1.10)
GFR calc Af Amer: >90 mL/min (ref >90)
Potassium: 4 meq/L (ref 3.5–5.1)

## 2012-11-24 LAB — OCCULT BLOOD, POC DEVICE: Fecal Occult Bld: POSITIVE — AB

## 2012-11-24 LAB — POCT I-STAT TROPONIN I

## 2012-11-24 LAB — CBC
HCT: 29.3 % — ABNORMAL LOW (ref 36.0–46.0)
Hemoglobin: 8.9 g/dL — ABNORMAL LOW (ref 12.0–15.0)
MCH: 22.6 pg — ABNORMAL LOW (ref 26.0–34.0)
MCHC: 30.4 g/dL (ref 30.0–36.0)
MCV: 74.6 fL — ABNORMAL LOW (ref 78.0–100.0)
Platelets: 363 K/uL (ref 150–400)
RBC: 3.93 MIL/uL (ref 3.87–5.11)
RDW: 19.7 % — ABNORMAL HIGH (ref 11.5–15.5)
WBC: 7.7 K/uL (ref 4.0–10.5)

## 2012-11-24 MED ORDER — LORAZEPAM 1 MG PO TABS
1.0000 mg | ORAL_TABLET | Freq: Once | ORAL | Status: AC
  Administered 2012-11-24: 1 mg via ORAL
  Filled 2012-11-24: qty 1

## 2012-11-24 MED ORDER — PANTOPRAZOLE SODIUM 40 MG PO TBEC: 40.0000 mg | DELAYED_RELEASE_TABLET | Freq: Every day | ORAL | Status: DC

## 2012-11-24 NOTE — ED Provider Notes (Signed)
History     CSN: [id number]  Arrival date & time 11/24/12  1302   First MD Initiated Contact with Patient 11/24/12 1512      Chief Complaint  Patient presents with  . Chest Pain  . Shortness of Breath    (Consider location/radiation/quality/duration/timing/severity/associated sxs/prior treatment) HPI Comments: Patient endorses generalized fatigue, episodes of shortness of breath and chest tightness this would present for several days. Patient traces her history back to was 3 weeks ago when she and her spouse were handling strawberry plants and she felt like she was having a mild allergic reaction. Patient reports several drug and food allergies and has Benadryl and other medications at home which he took herself with improvement. She then reports eating strawberries again about a week ago, developed a facial swelling, subjective throat swelling, rash and redness to her skin. She again treated herself with Benadryl with some improvement, later spoke to her physician who put her on a prednisone pack. She's been on it for about 5 days and the redness and most of the throat symptoms have cleared up. She unfortunately also developed oral thrush and now has been on 2 days of antifungal medication. She however continues to endorse feeling fatigued and with the chest tightness ongoing, came to the [facility] for further evaluation. She does admit that she has a considerable psychiatric history and admits that this may have more to do with her anxiety than anything else. She denies any pleuritic pain, no radiation of her chest tightness. No fever or cough. She denies prior history of coronary disease. She denies currently any skin rash, no epigastric pain, not worse with eating. She also states that she has been on a "cleansing diet" which includes mostly rice products and some vegetables. She reports that she did have some beef yesterday. She apparently has a history of iron deficiency anemia coupled  with very heavy menstrual bleeding. However she notes she has not had a significant menstrual cycle this year. She does have a history of irregular menstrual cycles. She reports some dark stools on occasion but not frank melena or bright red blood.  Patient is a 47 y.o. female presenting with chest pain and shortness of breath. The history is provided by the patient.  Chest Pain Associated symptoms: fatigue and shortness of breath   Associated symptoms: no abdominal pain, no cough, no fever, no nausea and not vomiting   Shortness of Breath Associated symptoms: chest pain and rash   Associated symptoms: no abdominal pain, no cough, no fever and no vomiting     Past Medical History  Diagnosis Date  . Asthma   . Allergic rhinitis, cause unspecified   . Bipolar 1 disorder   . Morbid obesity   . OSA (obstructive sleep apnea)   . Thyroid nodule   . Mental disorder   . 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 Pt has never been tested.   . Anxiety     Past Surgical History  Procedure Laterality Date  . Tubal ligation    . Cesarean section    . Noraplant      Family History  Problem Relation Age of Onset  . Breast cancer Mother   . Emphysema Paternal Grandmother   . Diabetes Father   . Asthma Mother     History  Substance Use Topics  . Smoking status: Former Smoker -- 0.50 packs/day for 10 years    Types: Cigarettes    Quit date: 07/30/2006  .  Smokeless tobacco: Never Used  . Alcohol Use: No    OB History   Grav Para Term Preterm Abortions TAB SAB Ect Mult Living   10 6 6  4  4   6       Review of Systems  Constitutional: Positive for fatigue. Negative for fever and chills.  HENT: Negative for congestion and rhinorrhea.   Respiratory: Positive for shortness of breath. Negative for cough.   Cardiovascular: Positive for chest pain.  Gastrointestinal: Negative for nausea, vomiting, abdominal pain, diarrhea, blood in stool and anal bleeding.  Genitourinary: Negative  for dysuria and difficulty urinating.  Skin: Positive for rash.  All other systems reviewed and are negative.    Allergies  Bupropion hcl; Capzasin; Citalopram hydrobromide; Other; Lamictal; and Latex  Home Medications   Current Outpatient Rx  Name  Route  Sig  Dispense  Refill  . albuterol (PROVENTIL HFA;VENTOLIN HFA) 108 (90 BASE) MCG/ACT inhaler   Inhalation   Inhale 2 puffs into the lungs 3 (three) times daily.          Marland Kitchen aspirin EC 81 MG tablet   Oral   Take 162-243 mg by mouth 2 (two) times daily as needed for pain.         . beclomethasone (QVAR) 80 MCG/ACT inhaler   Inhalation   Inhale 2 puffs into the lungs 3 (three) times daily.         . diphenhydrAMINE (BENADRYL) 25 MG tablet   Oral   Take 25 mg by mouth every 6 (six) hours as needed for itching.         Marland Kitchen EPINEPHrine (EPI-PEN) 0.3 mg/0.3 mL DEVI   Intramuscular   Inject 0.3 mg into the muscle as needed (for allergic reaction).          . fluticasone (FLONASE) 50 MCG/ACT nasal spray   Nasal   Place 2 sprays into the nose daily.         Marland Kitchen gabapentin (NEURONTIN) 300 MG capsule   Oral   Take 300-600 mg by mouth 3 (three) times daily. 1 tablet (300 mg) every morning and at lunch, 2 tablets (600 mg) at night         . guaifenesin (HUMIBID E) 400 MG TABS   Oral   Take 400 mg by mouth 3 (three) times daily.         Marland Kitchen loratadine (CLARITIN) 10 MG tablet   Oral   Take 10 mg by mouth daily.         Marland Kitchen LORazepam (ATIVAN) 1 MG tablet   Oral   Take 1 mg by mouth 3 (three) times daily as needed for anxiety.          Marland Kitchen nystatin (MYCOSTATIN) 100000 UNIT/ML suspension   Oral   Take 500,000 Units by mouth 2 (two) times daily as needed (for thrust).         . OIL OF OREGANO PO   Oral   Take 1 capsule by mouth 3 (three) times daily.         . ondansetron (ZOFRAN) 4 MG tablet   Oral   Take 4 mg by mouth 2 (two) times daily as needed for nausea (migraines).         Marland Kitchen OVER THE COUNTER  MEDICATION   Oral   Take 1 capsule by mouth 3 (three) times daily. Stinging nettle for allergies         . OVER THE COUNTER MEDICATION   Oral  Take 1 capsule by mouth 3 (three) times daily. Papain - papaya and pineapple combination to aid digestion         . OVER THE COUNTER MEDICATION   Oral   Take 1 capsule by mouth 3 (three) times daily. "lung function"         . predniSONE (DELTASONE) 20 MG tablet   Oral   Take 20 mg by mouth daily. Tapered dose started 11/18/12:  1 tablet (20 mg) every day for 7 days, then taper down:  Every other day, then every 3rd day.         . rizatriptan (MAXALT-MLT) 10 MG disintegrating tablet   Oral   Take 10 mg by mouth daily as needed for migraine. May repeat in 2 hours if needed for migraines         . traMADol (ULTRAM) 50 MG tablet   Oral   Take 100 mg by mouth 3 (three) times daily.          . pantoprazole (PROTONIX) 40 MG tablet   Oral   Take 1 tablet (40 mg total) by mouth daily.   14 tablet   0     BP 129/75  Pulse 98  Temp(Src) 98.3 F (36.8 C) (Oral)  Resp 17  SpO2 100%  Physical Exam  Nursing note and vitals reviewed. Constitutional: She is oriented to person, place, and time. She appears well-developed and well-nourished. No distress.  HENT:  Head: Normocephalic and atraumatic.  Eyes: Conjunctivae and EOM are normal. No scleral icterus.  Neck: Neck supple. No tracheal deviation present.  Cardiovascular: Normal rate, regular rhythm and intact distal pulses.   No murmur heard. Pulmonary/Chest: Effort normal. Stridor present. No respiratory distress. She has no wheezes. She has no rales. She exhibits tenderness.  Abdominal: Soft. She exhibits no distension.  Genitourinary: Rectal exam shows no mass, no tenderness and anal tone normal. Guaiac negative stool.  Chaperone present  Neurological: She is alert and oriented to person, place, and time.  Skin: Skin is warm and dry. No rash noted. She is not diaphoretic.   Psychiatric: She has a normal mood and affect.    ED Course  Procedures (including critical care time)  Labs Reviewed  CBC - Abnormal; Notable for the following:    Hemoglobin 8.9 (*)    HCT 29.3 (*)    MCV 74.6 (*)    MCH 22.6 (*)    RDW 19.7 (*)    All other components within normal limits  BASIC METABOLIC PANEL - Abnormal; Notable for the following:    Glucose, Bld 129 (*)    All other components within normal limits  OCCULT BLOOD, POC DEVICE - Abnormal; Notable for the following:    Fecal Occult Bld POSITIVE (*)    All other components within normal limits  POCT I-STAT TROPONIN I   Dg Chest 2 View  11/24/2012  *RADIOLOGY REPORT*  Clinical Data: Chest pain and shortness of breath.  CHEST - 2 VIEW  Comparison: 09/04/2012  Findings: Two views of the chest demonstrate clear lungs. Heart and mediastinum are within normal limits.  Trachea is midline.  The bony thorax is intact.  IMPRESSION: No acute cardiopulmonary disease.   Original Report Authenticated By: Richarda Overlie, M.D.      1. Anxiety   2. Anemia   3. Lower GI bleed    ECG at time 13:24 shows NSR at rate 93, normal axis, normal intervals, no ST or T wave abn's.  NML ECG.  RA sat is 100% and I interpret to be normal  4:29 PM Pt feels improved . Hemoccult is positive.  Discussed admission for obs veruss clsoe outpt follow up.  Pt would rather followup with her PCP Dr. Jeannetta Nap this week.  She understadns to return for worse CP, syncope, worse dizziness, frank bleeding.  Pt is not on anticoagulants.  Will put ehr back on protonix which pt used to be on when she went to [facility] and had no reactions to it.    MDM  Pt's Hgb is 8.9.  Pt was transfused in the past at 7.9 related to heavy menstruation.  Pt is nto taking iron any more, cut out meat and beef significantly from diet.  This is discussed with pt.  I suspect CP is from anxiety and h/o anxiety and bipolar.  No evidence of ischemia.  CXR is normal per radiologist.           Gavin Pound. Arcelia Pals, MD 11/24/12 4098

## 2012-11-24 NOTE — ED Notes (Signed)
Pt states that she has had a couple of allergic reactions in the last couple of weeks and is currently on prednisone.  Pt states that her fingers are swollen and in her feet.  Pt woke up with shortness of breath and hard to take deep breath and feels like her throat is swollen.  Pt reports chest discomfort too.

## 2012-11-24 NOTE — ED Notes (Signed)
Pt reports anaphylactic reaction and thrush. States that she is on Difucan for the thrush, has an Epi pen for her allergies but did not use it.

## 2012-11-24 NOTE — Discharge Instructions (Signed)
 Anemia, Nonspecific Your exam and blood tests show you are anemic. This means your blood (hemoglobin) level is low. Normal hemoglobin values are 12 to 15 g/dL for females and 14 to 17 g/dL for males. Make a note of your hemoglobin level today. The hematocrit percent is also used to measure anemia. A normal hematocrit is 38% to 46% in females and 42% to 49% in males. Make a note of your hematocrit level today. CAUSES  Anemia can be due to many different causes.  Excessive bleeding from periods (in women).  Intestinal bleeding.  Poor nutrition.  Kidney, thyroid , liver, and bone marrow diseases. SYMPTOMS  Anemia can come on suddenly (acute). It can also come on slowly. Symptoms can include:  Minor weakness.  Dizziness.  Palpitations.  Shortness of breath. Symptoms may be absent until half your hemoglobin is missing if it comes on slowly. Anemia due to acute blood loss from an injury or internal bleeding may require blood transfusion if the loss is severe. Hospital care is needed if you are anemic and there is significant continual blood loss. TREATMENT   Stool tests for blood (Hemoccult) and additional lab tests are often needed. This determines the best treatment.  Further checking on your condition and your response to treatment is very important. It often takes many weeks to correct anemia. Depending on the cause, treatment can include:  Supplements of iron.  Vitamins B12 and folic acid.  Hormone medicines.If your anemia is due to bleeding, finding the cause of the blood loss is very important. This will help avoid further problems. SEEK IMMEDIATE MEDICAL CARE IF:   You develop fainting, extreme weakness, shortness of breath, or chest pain.  You develop heavy vaginal bleeding.  You develop bloody or black, tarry stools or vomit up blood.  You develop a high fever, rash, repeated vomiting, or dehydration. Document Released: 08/23/2004 Document Revised: 10/08/2011 Document  Reviewed: 05/31/2009 [facility] Patient Information 2013 [place], [place].     Anxiety and Panic Attacks Your caregiver has informed you that you are having an anxiety or panic attack. There may be many forms of this. Most of the time these attacks come suddenly and without warning. They come at any time of day, including periods of sleep, and at any time of life. They may be strong and unexplained. Although panic attacks are very scary, they are physically harmless. Sometimes the cause of your anxiety is not known. Anxiety is a protective mechanism of the body in its fight or flight mechanism. Most of these perceived danger situations are actually nonphysical situations (such as anxiety over losing a job). CAUSES  The causes of an anxiety or panic attack are many. Panic attacks may occur in otherwise healthy people given a certain set of circumstances. There may be a genetic cause for panic attacks. Some medications may also have anxiety as a side effect. SYMPTOMS  Some of the most common feelings are:  Intense terror.  Dizziness, feeling faint.  Hot and cold flashes.  Fear of going crazy.  Feelings that nothing is real.  Sweating.  Shaking.  Chest pain or a fast heartbeat (palpitations).  Smothering, choking sensations.  Feelings of impending doom and that death is near.  Tingling of extremities, this may be from over-breathing.  Altered reality (derealization).  Being detached from yourself (depersonalization). Several symptoms can be present to make up anxiety or panic attacks. DIAGNOSIS  The evaluation by your caregiver will depend on the type of symptoms you are experiencing. The diagnosis of  anxiety or panic attack is made when no physical illness can be determined to be a cause of the symptoms. TREATMENT  Treatment to prevent anxiety and panic attacks may include:  Avoidance of circumstances that cause anxiety.  Reassurance and relaxation.  Regular  exercise.  Relaxation therapies, such as yoga.  Psychotherapy with a psychiatrist or therapist.  Avoidance of caffeine, alcohol and illegal drugs.  Prescribed medication. SEEK IMMEDIATE MEDICAL CARE IF:   You experience panic attack symptoms that are different than your usual symptoms.  You have any worsening or concerning symptoms. Document Released: 07/16/2005 Document Revised: 10/08/2011 Document Reviewed: 11/17/2009 [facility] Patient Information 2013 [place], [place].

## 2012-11-28 ENCOUNTER — Telehealth: Payer: Self-pay | Admitting: Gastroenterology

## 2012-11-28 NOTE — Telephone Encounter (Signed)
Pt scheduled to see Dr. Arlyce Dice 12/08/12@9 :Eddie Candle to notify pt of appt date and time and fax records.

## 2012-12-01 ENCOUNTER — Encounter (HOSPITAL_COMMUNITY): Payer: Self-pay

## 2012-12-01 ENCOUNTER — Inpatient Hospital Stay (HOSPITAL_COMMUNITY)
Admission: AD | Admit: 2012-12-01 | Discharge: 2012-12-01 | Disposition: A | Discharge status: Home / Self Care | Payer: Medicaid Other | Source: Ambulatory Visit | Attending: Family Medicine | Admitting: Family Medicine

## 2012-12-01 DIAGNOSIS — R0602 Shortness of breath: Secondary | ICD-10-CM | POA: Insufficient documentation

## 2012-12-01 DIAGNOSIS — D649 Anemia, unspecified: Secondary | ICD-10-CM

## 2012-12-01 DIAGNOSIS — D6489 Other specified anemias: Secondary | ICD-10-CM | POA: Insufficient documentation

## 2012-12-01 LAB — CBC
HCT: 28.4 % — ABNORMAL LOW (ref 36.0–46.0)
MCH: 22.4 pg — ABNORMAL LOW (ref 26.0–34.0)
MCV: 76.5 fL — ABNORMAL LOW (ref 78.0–100.0)
Platelets: 305 10*3/uL (ref 150–400)
RBC: 3.71 MIL/uL — ABNORMAL LOW (ref 3.87–5.11)
WBC: 6.9 10*3/uL (ref 4.0–10.5)

## 2012-12-01 LAB — URINALYSIS, ROUTINE W REFLEX MICROSCOPIC
Bilirubin Urine: NEGATIVE
Glucose, UA: 250 mg/dL — AB
Hgb urine dipstick: NEGATIVE
Protein, ur: NEGATIVE mg/dL
Urobilinogen, UA: 0.2 mg/dL (ref 0.0–1.0)

## 2012-12-01 MED ORDER — PANTOPRAZOLE SODIUM 40 MG PO TBEC
40.0000 mg | DELAYED_RELEASE_TABLET | Freq: Once | ORAL | Status: DC
  Filled 2012-12-01: qty 1

## 2012-12-01 MED ORDER — PANTOPRAZOLE SODIUM 40 MG PO TBEC: 40.0000 mg | DELAYED_RELEASE_TABLET | Freq: Every day | ORAL | Status: DC

## 2012-12-01 NOTE — MAU Note (Signed)
Pt seen a week ago at [facility] diagnosed with anemia.  Pt [facility] and was going to set her up with Hematologist.  Pt does not have an appt at this time.  Pt feeling more dizzy and SOB.

## 2012-12-01 NOTE — MAU Provider Note (Signed)
History     CSN: [id number]  Arrival date and time: 12/01/12 1920   None     Chief Complaint  Patient presents with  . Shortness of Breath  . Anemia   HPI  [name] is a 47 y.o. who presents today with anemia. She was seen it the ED on 11/24/12 and found to have fecal occult blood and hgb of 8.9. She has since begun to feel very weak and unable to "lift her head". She states that her PCP ([facility]) "has not been unable to get anybody on the situation". They tried to get her in to see a GI doctor and the earliest appointment was on 12/09/12. They told her B12 was low and they were going to have her see a hematologist. She has not started taking the B12, and she has not heard back from her PCP about scheduling with the hematologist. She states that the last time she felt this bad she had a blood transfusion.   She is not taking an iron supplement. She states that she gets hives when she takes an iron supplement. She has been trying to eat more meat.   Past Medical History  Diagnosis Date  . Asthma   . Allergic rhinitis, cause unspecified   . Bipolar 1 disorder   . Morbid obesity   . OSA (obstructive sleep apnea)   . Thyroid nodule   . Mental disorder   . 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 Pt has never been tested.   . Anxiety     Past Surgical History  Procedure Laterality Date  . Tubal ligation    . Cesarean section    . Noraplant    . Wisdom tooth extraction      Family History  Problem Relation Age of Onset  . Breast cancer Mother   . Emphysema Paternal Grandmother   . Diabetes Father   . Asthma Mother     History  Substance Use Topics  . Smoking status: Former Smoker -- 0.50 packs/day for 10 years    Types: Cigarettes    Quit date: 07/30/2006  . Smokeless tobacco: Never Used  . Alcohol Use: No    Allergies:  Allergies  Allergen Reactions  . Bupropion Hcl Other (See Comments)    swelling in hands  . Capzasin  (Capsaicin) Other (See Comments)    Red and burning  . Citalopram Hydrobromide Other (See Comments)    Doesn't remember reaction  . Other Other (See Comments)    Strawberries caused facial swelling and rash  . Stevia (Stevioside) Swelling    Face and throat swell   . Lamictal (Lamotrigine) Rash  . Latex Other (See Comments)    Blisters where the latex touched it    Prescriptions prior to admission  Medication Sig Dispense Refill  . albuterol (PROVENTIL HFA;VENTOLIN HFA) 108 (90 BASE) MCG/ACT inhaler Inhale 2 puffs into the lungs 3 (three) times daily.       Marland Kitchen aspirin EC 81 MG tablet Take 162-243 mg by mouth 2 (two) times daily as needed for pain.      . beclomethasone (QVAR) 80 MCG/ACT inhaler Inhale 2 puffs into the lungs 3 (three) times daily.      . diphenhydrAMINE (BENADRYL) 25 MG tablet Take 25 mg by mouth every 6 (six) hours as needed for itching.      . fluticasone (FLONASE) 50 MCG/ACT nasal spray Place 2 sprays into the nose daily.      Marland Kitchen  gabapentin (NEURONTIN) 300 MG capsule Take 300-600 mg by mouth 3 (three) times daily. 1 tablet (300 mg) every morning and at lunch, 2 tablets (600 mg) at night      . guaifenesin (HUMIBID E) 400 MG TABS Take 400 mg by mouth 3 (three) times daily.      Marland Kitchen loratadine (CLARITIN) 10 MG tablet Take 10 mg by mouth daily.      Marland Kitchen LORazepam (ATIVAN) 1 MG tablet Take 1 mg by mouth 3 (three) times daily as needed for anxiety.       Marland Kitchen nystatin (MYCOSTATIN) 100000 UNIT/ML suspension Take 500,000 Units by mouth 2 (two) times daily as needed (for thrust).      . OIL OF OREGANO PO Take 1 capsule by mouth 3 (three) times daily.      . ondansetron (ZOFRAN) 4 MG tablet Take 4 mg by mouth 2 (two) times daily as needed for nausea (migraines).      Marland Kitchen OVER THE COUNTER MEDICATION Take 1 capsule by mouth 3 (three) times daily. Stinging nettle for allergies      . OVER THE COUNTER MEDICATION Take 1 capsule by mouth 3 (three) times daily. Papain - papaya and pineapple  combination to aid digestion      . OVER THE COUNTER MEDICATION Take 1 capsule by mouth 3 (three) times daily. "lung function"      . pantoprazole (PROTONIX) 40 MG tablet Take 1 tablet (40 mg total) by mouth daily.  14 tablet  0  . predniSONE (DELTASONE) 20 MG tablet Take 20 mg by mouth daily. Tapered dose started 11/18/12:  1 tablet (20 mg) every day for 7 days, then taper down:  Every other day, then every 3rd day.      . rizatriptan (MAXALT-MLT) 10 MG disintegrating tablet Take 10 mg by mouth daily as needed for migraine. May repeat in 2 hours if needed for migraines      . traMADol (ULTRAM) 50 MG tablet Take 100 mg by mouth 3 (three) times daily.       Marland Kitchen EPINEPHrine (EPI-PEN) 0.3 mg/0.3 mL DEVI Inject 0.3 mg into the muscle as needed (for allergic reaction).         Review of Systems  Constitutional: Negative for fever.  Eyes: Negative for blurred vision.  Respiratory: Negative for cough and shortness of breath.   Gastrointestinal: Negative for nausea, vomiting, abdominal pain, diarrhea and constipation.  Genitourinary: Negative for dysuria, urgency and frequency.  Musculoskeletal: Negative for myalgias.  Neurological: Positive for dizziness. Negative for headaches.   Physical Exam   Blood pressure 147/80, pulse 111, temperature 98.9 F (37.2 C), temperature source Oral, resp. rate 20, height 5\' 10"  (1.778 m), weight 164.746 kg (363 lb 3.2 oz), SpO2 98.00%.  Physical Exam  Nursing note and vitals reviewed. Constitutional: She is oriented to person, place, and time. She appears well-developed and well-nourished. No distress.  Cardiovascular: Normal rate.   Respiratory: Effort normal.  GI: Soft.  Neurological: She is alert and oriented to person, place, and time.  Skin: Skin is warm and dry.  Psychiatric: She has a normal mood and affect.   Orthostatic blood pressures are normal.   MAU Course  Procedures  Results for orders placed during the hospital encounter of 12/01/12 (from  the past 24 hour(s))  URINALYSIS, ROUTINE W REFLEX MICROSCOPIC     Status: Abnormal   Collection Time    12/01/12  7:40 PM      Result Value Range   Color,  Urine YELLOW  YELLOW   APPearance CLEAR  CLEAR   Specific Gravity, Urine 1.010  1.005 - 1.030   pH 6.0  5.0 - 8.0   Glucose, UA 250 (*) NEGATIVE mg/dL   Hgb urine dipstick NEGATIVE  NEGATIVE   Bilirubin Urine NEGATIVE  NEGATIVE   Ketones, ur NEGATIVE  NEGATIVE mg/dL   Protein, ur NEGATIVE  NEGATIVE mg/dL   Urobilinogen, UA 0.2  0.0 - 1.0 mg/dL   Nitrite NEGATIVE  NEGATIVE   Leukocytes, UA NEGATIVE  NEGATIVE  CBC     Status: Abnormal   Collection Time    12/01/12  7:48 PM      Result Value Range   WBC 6.9  4.0 - 10.5 K/uL   RBC 3.71 (*) 3.87 - 5.11 MIL/uL   Hemoglobin 8.3 (*) 12.0 - 15.0 g/dL   HCT 16.1 (*) 09.6 - 04.5 %   MCV 76.5 (*) 78.0 - 100.0 fL   MCH 22.4 (*) 26.0 - 34.0 pg   MCHC 29.2 (*) 30.0 - 36.0 g/dL   RDW 40.9 (*) 81.1 - 91.4 %   Platelets 305  150 - 400 K/uL  POCT PREGNANCY, URINE     Status: None   Collection Time    12/01/12  7:50 PM      Result Value Range   Preg Test, Ur NEGATIVE  NEGATIVE    Assessment and Plan   1. Anemia    Likely 2/2 some occult GI  bleeding She as appointment on 12/09/12 to see GI MD [facility] ED if active bleeding starts Will start PPI while waiting Pt is unable to take FeSO4 supplement 2/2 reported allergy  Tawnya Crook 12/01/2012, 8:23 PM

## 2012-12-02 NOTE — MAU Provider Note (Signed)
Chart reviewed and agree with management and plan.  

## 2012-12-03 ENCOUNTER — Telehealth: Payer: Self-pay | Admitting: Hematology & Oncology

## 2012-12-03 NOTE — Telephone Encounter (Signed)
Left pt message to call and schedule appointment. Said on message MD would like to see her Friday.

## 2012-12-03 NOTE — Telephone Encounter (Signed)
Pt aware of 5-9 appointment °

## 2012-12-05 ENCOUNTER — Ambulatory Visit (HOSPITAL_BASED_OUTPATIENT_CLINIC_OR_DEPARTMENT_OTHER): Payer: Medicaid Other | Admitting: Hematology & Oncology

## 2012-12-05 ENCOUNTER — Other Ambulatory Visit (HOSPITAL_BASED_OUTPATIENT_CLINIC_OR_DEPARTMENT_OTHER): Payer: Medicaid Other | Admitting: Lab

## 2012-12-05 ENCOUNTER — Ambulatory Visit: Payer: Medicaid Other

## 2012-12-05 VITALS — BP 133/71 | HR 106 | Temp 98.1°F | Resp 18 | Ht 70.0 in | Wt 361.0 lb

## 2012-12-05 DIAGNOSIS — D509 Iron deficiency anemia, unspecified: Secondary | ICD-10-CM

## 2012-12-05 DIAGNOSIS — D649 Anemia, unspecified: Secondary | ICD-10-CM

## 2012-12-05 LAB — CBC WITH DIFFERENTIAL (CANCER CENTER ONLY)
BASO#: 0 10*3/uL (ref 0.0–0.2)
EOS%: 1.2 % (ref 0.0–7.0)
Eosinophils Absolute: 0.1 10*3/uL (ref 0.0–0.5)
HGB: 8.6 g/dL — ABNORMAL LOW (ref 11.6–15.9)
LYMPH%: 14.9 % (ref 14.0–48.0)
MCH: 22.8 pg — ABNORMAL LOW (ref 26.0–34.0)
MCHC: 29 g/dL — ABNORMAL LOW (ref 32.0–36.0)
MCV: 79 fL — ABNORMAL LOW (ref 81–101)
MONO%: 6.4 % (ref 0.0–13.0)
NEUT#: 7.3 10*3/uL — ABNORMAL HIGH (ref 1.5–6.5)
NEUT%: 77.1 % (ref 39.6–80.0)
RBC: 3.77 10*6/uL (ref 3.70–5.32)

## 2012-12-05 LAB — VITAMIN B12: Vitamin B-12: 174 pg/mL — ABNORMAL LOW (ref 211–911)

## 2012-12-05 LAB — CHCC SATELLITE - SMEAR

## 2012-12-05 LAB — LACTATE DEHYDROGENASE: LDH: 171 U/L (ref 94–250)

## 2012-12-05 IMAGING — US US TRANSVAGINAL NON-OB
1 series · 14 of 25 positions shown · non-contrast
Comparison: 05/11/2009 CT

CLINICAL DATA: Vaginal bleeding/irregular menses.

TRANSABDOMINAL AND TRANSVAGINAL ULTRASOUND OF PELVIS
TECHNIQUE: Both transabdominal and transvaginal ultrasound
examinations of the pelvis were performed. Transabdominal technique
was performed for global imaging of the pelvis including uterus,
ovaries, adnexal regions, and pelvic cul-de-sac.

[Series 1: us transvaginal non-ob · 0.35mm/px · 14 of 26 slices shown]
[im 1/26]
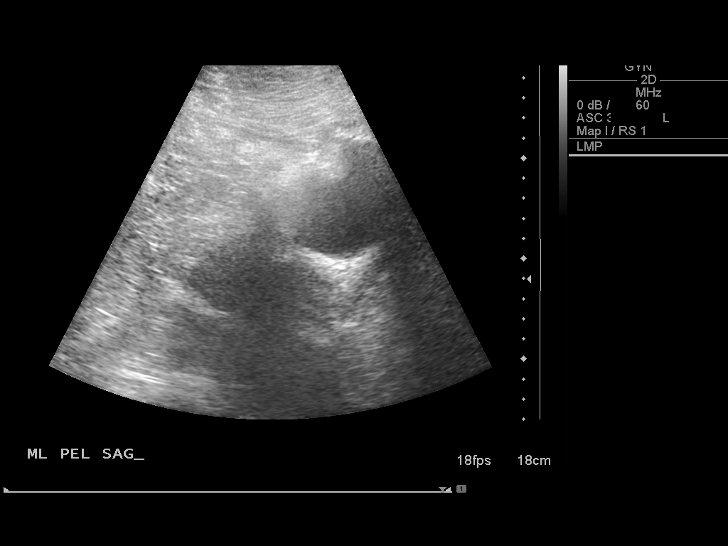
[im 3/26]
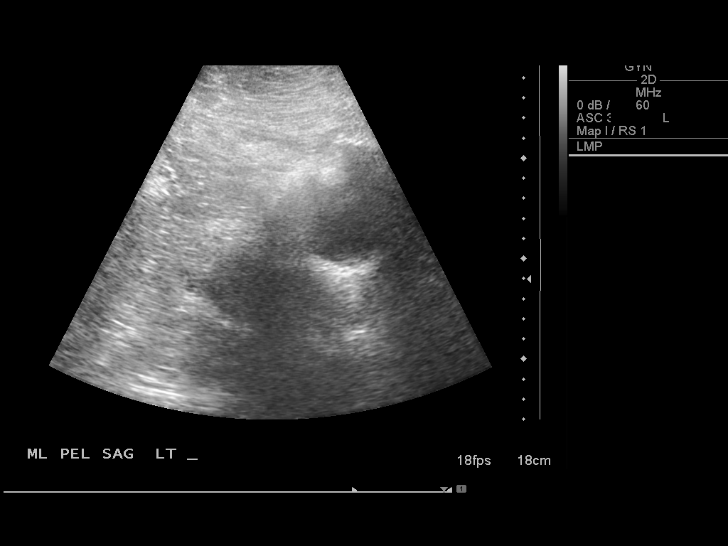
[im 5/26]
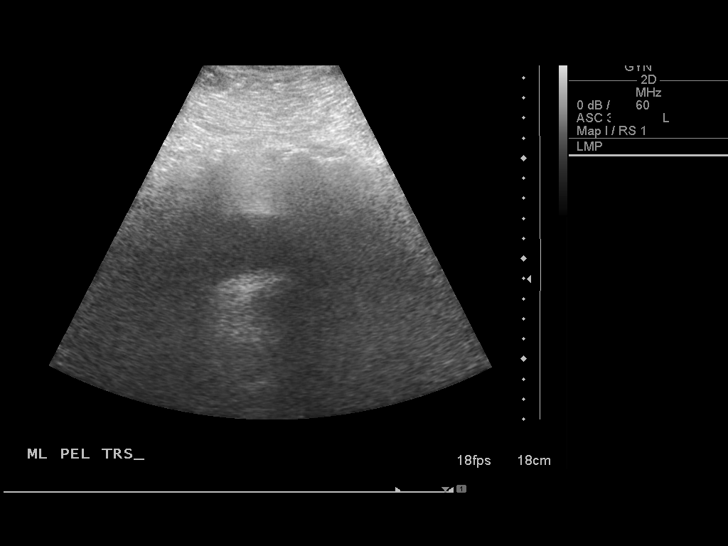
[im 7/26]
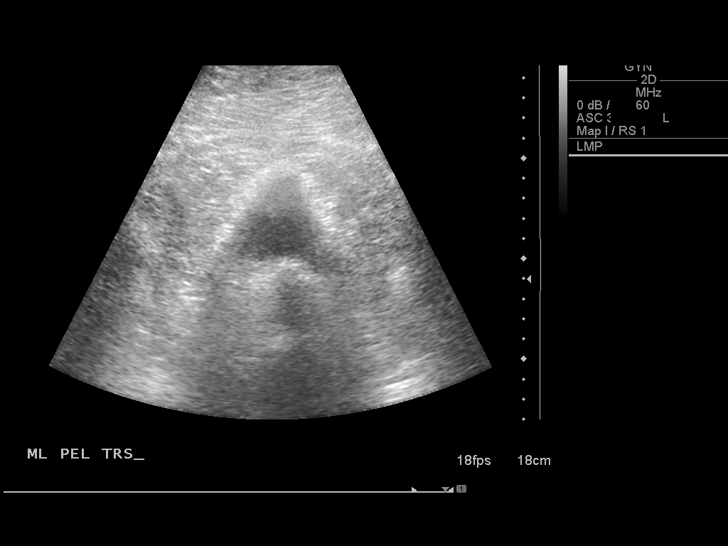
[im 9/26]
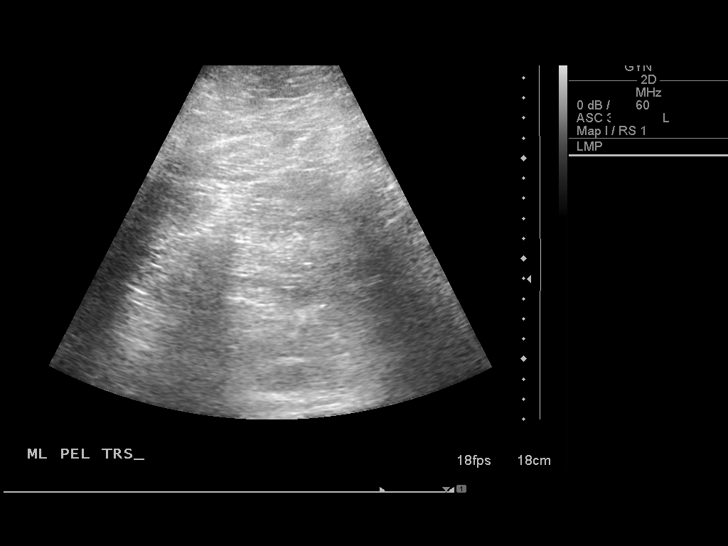
[im 10/26]
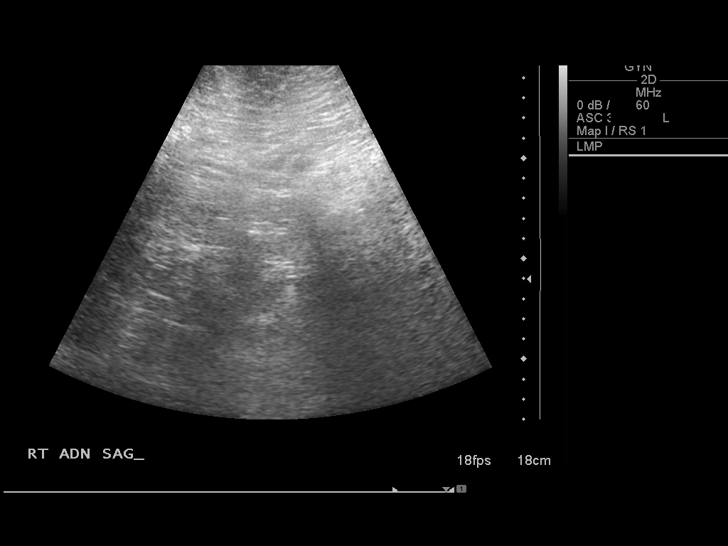
[im 12/26]
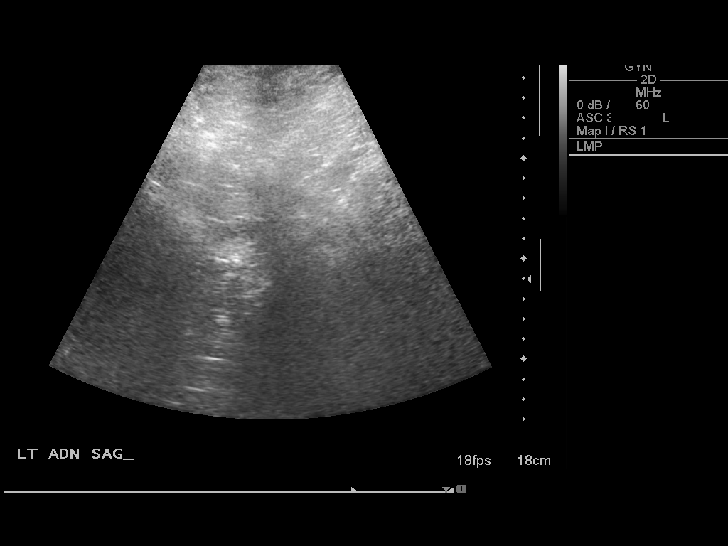
[im 14/26]
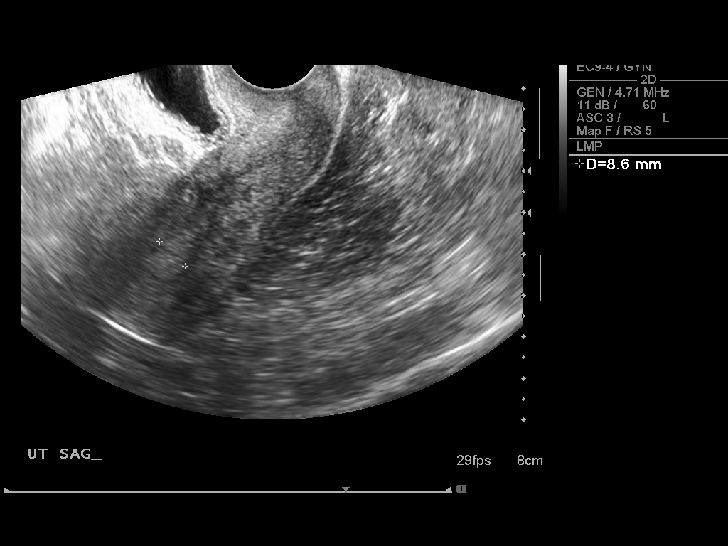
[im 16/26]
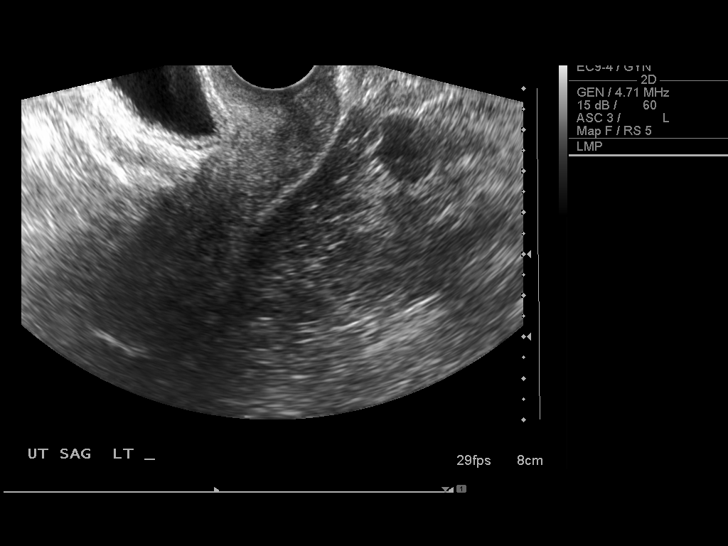
[im 17/26]
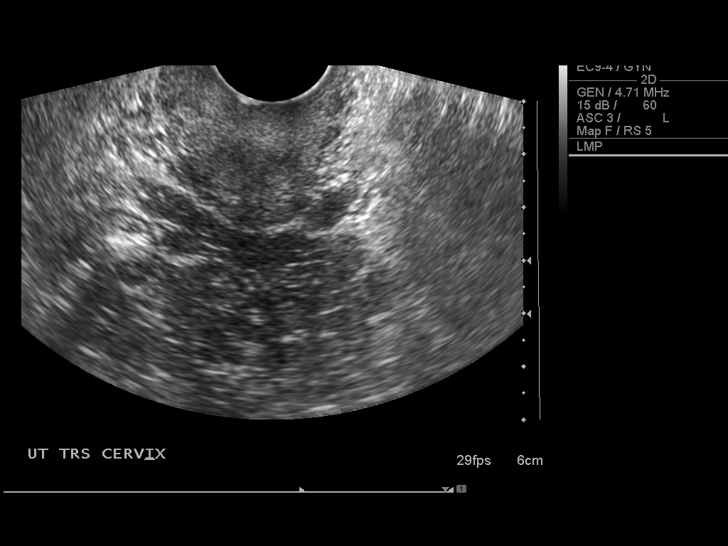
[im 19/26]
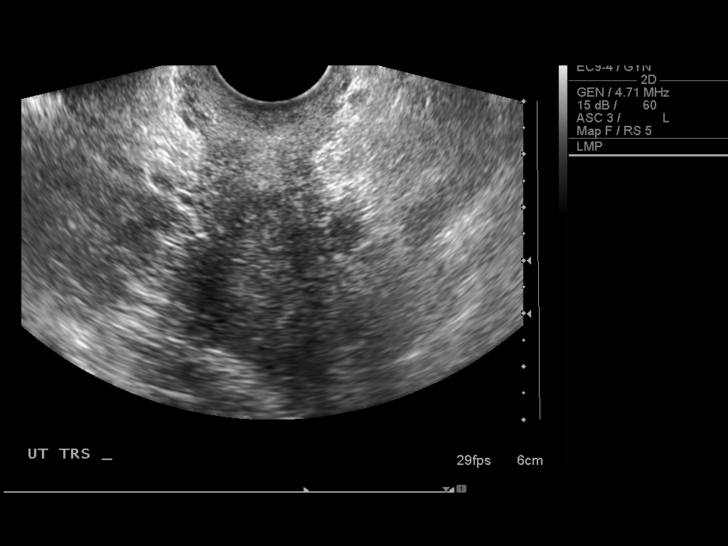
[im 21/26]
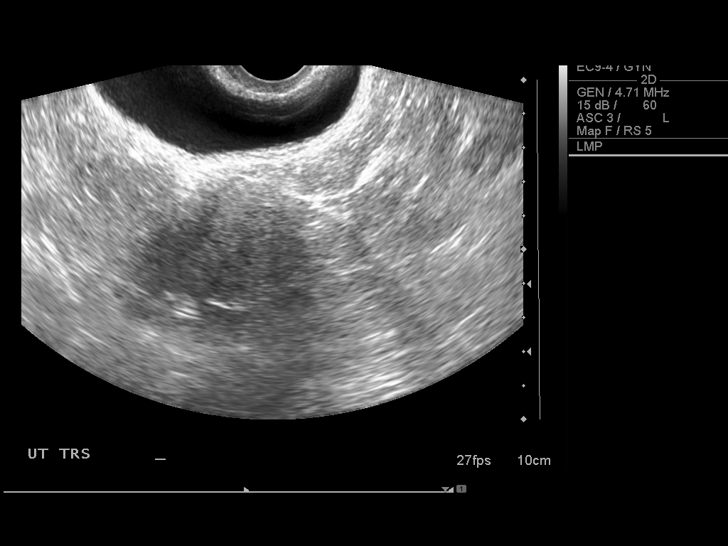
[im 23/26]
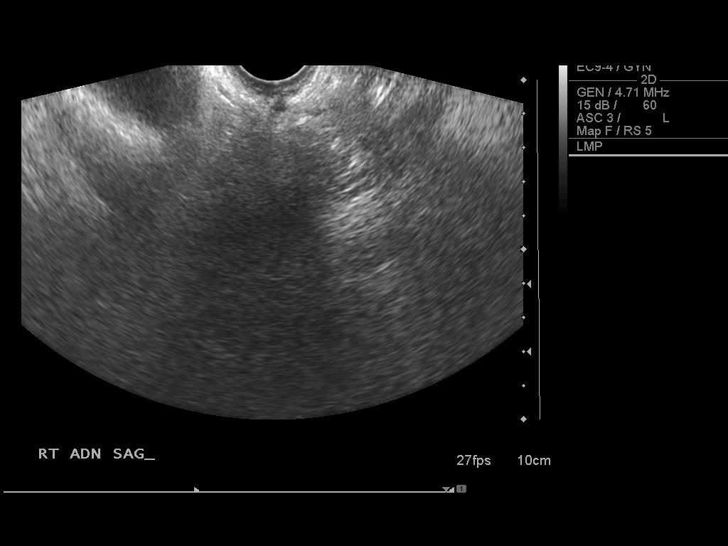
[im 26/26]
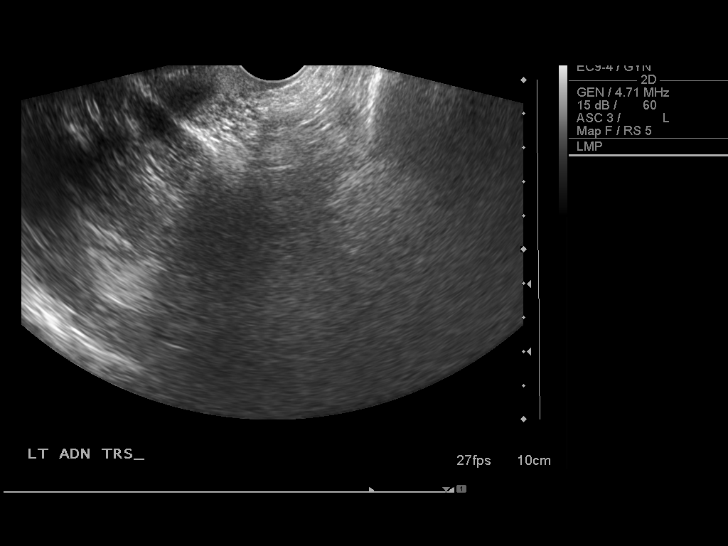

[14 of 25 positions shown; findings below may reference images not displayed]

It was necessary to proceed with endovaginal exam following the
transabdominal exam to visualize the the endometrium and adnexa.
FINDINGS: Uterus: Poorly visualized.  Measures approximately 8.4 x 4.4 x
cm.

Endometrium: Within normal limits, measuring up to 9 mm.

Right ovary:  Not visualized.

Left ovary: Not visualized.

Other findings: No free fluid identified.
IMPRESSION: Technically limited examination secondary to patient body habitus.
No definite abnormality of the uterus.  The ovaries were not
visualized.

## 2012-12-05 NOTE — Progress Notes (Signed)
This office note has been dictated.

## 2012-12-06 NOTE — Progress Notes (Signed)
CC:   Stacey [name], M.D.  DIAGNOSIS:  Microcytic anemia.  HISTORY OF PRESENT ILLNESS:  [name] is a very nice 47 year old white female.  Of note, she is a vegetarian.  She has 6 kids.  She has not had complaints about heavy monthly cycles.  She still has them but they do not appear to be all that significant. She has had 2 C-sections in the past.  She is followed by Dr. Jeannetta Nap.  He has done some lab work on her.  He has found that she is anemic.  Going back through the medical record, she had a CBC done back in October 2012.  This showed a white cell count 11.8, hemoglobin 7.9, hematocrit 28.9, platelet count was 457.  MCV was 67.  [name] says that she has a lot of allergies.  She apparently has had some difficulties taking some oral iron.  She has never had IV iron.  In October 2012 her hemoglobin was 10.2, hematocrit was 36.7.  MCV was 79.  At that time, I am not sure if she was put on oral iron supplementation.  In February of 2014, a CBC was done which showed a white cell count of 13, hemoglobin 10.3, hematocrit 35 and platelet count (sic) was 75 and the platelet count was 457.  Most recently, her CBC showed a white cell count 6.9, hemoglobin 8.3, hematocrit 28.4 and platelet count 305.  MCV is 76.  She did have some additional lab work done by Dr. Jeannetta Nap.  She was found to have a ferritin of 8 and a vitamin B12 of 128.  She said that she was supposed to get some iron shots by Dr. Jeannetta Nap but they did not have any B12 in stock.  She has had no obvious weight loss or weight gain.  She has had no cough.  She has had some occasional leg swelling.  There has been no rash.  She has had no headache.  There has been no hair loss.  She is not aware of any kind of blood problems in the family.  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  She said her family is from [place].  PAST MEDICAL HISTORY: 1. Remarkable for bipolar disorder. 2. Obstructive sleep apnea. 3. Anxiety. 4. Asthma. 5. Allergic rhinitis.  ALLERGIES: 1. Bupropion. 2. Capsaicin. 3. Lamictal. 4. Latex. 5. Stevia.  MEDICATIONS: 1. Albuterol inhaler 2 puffs t.i.d. p.r.n. 2. Qvar inhaler 2 puffs t.i.d. p.r.n. 3. Flonase nasal spray 2 sprays daily. 4. Neurontin 3-600 mg p.o. t.i.d. 5. Guaifenesin 400 mg p.o. t.i.d.,. 6. Claritin 10 mg p.o. daily. 7. Ativan 1 mg p.o. t.i.d. p.r.n. 8. Zofran 4 mg p.o. b.i.d. p.r.n. 9. Protonix 40 mg p.o. daily. 10.Prednisone on a taper. 11.Maxalt 10 mg p.o. daily as needed for migraine. 12.Ultram 100 mg p.o. t.i.d.  SOCIAL HISTORY:  Negative for tobacco use.  She may have smoked a long time ago.  There is no alcohol use.  Again, she is a vegetarian.  FAMILY HISTORY:  Noncontributory from a hematologic point of view. Again, she is not aware of anybody with any kind of anemia or blood problems in the family.  REVIEW OF SYSTEMS:  As stated in history of present illness.  No additional findings noted on a 12 system review.  PHYSICAL EXAMINATION:  General:  This is an obese white female in no obvious distress.  Vital signs:  Temperature of 98.2, pulse 106, respiratory rate 18, blood pressure 133/71.  Weight is 361.  Head and neck:  Shows a normocephalic, atraumatic skull.  There are no ocular or oral lesions.  There is no scleral icterus.  There is no adenopathy in the neck.  Thyroid is nonpalpable.  Lungs:  Clear.  There is no rales, wheezing or rhonchi.  Cardiac:  Regular rate and rhythm with a normal S1, S2 and occasional extra beat.  There are no murmurs, rubs or bruits. Abdomen:  Obese.  Abdomen is soft.  She has good bowel sounds.  There is no fluid wave.  There is no palpable hepatosplenomegaly.  Back:  Shows no tenderness over the spine, ribs or hips.  She has no kyphosis or osteoporotic change.  Extremities:  No clubbing, cyanosis or  edema.  She may have some slight stasis dermatitis changes in the lower legs.  Skin: No rashes, ecchymoses or petechiae.  Neurological:  Shows no focal neurological deficits.  LABORATORY STUDIES:  White cell count 9.5, hemoglobin 8.6, hematocrit 29.7, platelet count 352.  MCV is 79.  Ferritin is 5, percent iron saturation is 4%.  Iron is 14.  Reticulocyte count, corrected, is about 1.2.  Peripheral smear shows mild anisocytosis and poikilocytosis.  She has microcytic red cells.  There are some occasional target cells.  There are no inclusion bodies.  She has no nucleated red cells.  There are no teardrop cells.  I see no rouleaux formation.  There are no schistocytes or spherocytes.  There are some hypochromic red cells.  White cells appear with some increased hypersegmented polys.  I do not see any immature myeloid cells.  There are no atypical lymphocytes.  I see no blasts.  Her platelets are adequate in number and size.  She has rare large platelet.  IMPRESSION:  [name] is a very nice 47 year old white female with microcytic anemia.  Her blood smear certainly gives the impression that there may be some B12 deficiency.  She has numerous hypersegmented polys.  I saw some white cells with 7 nuclei.  Her iron studies clearly show iron deficiency.  She will need IV iron.  I am a little bit worried regarding this history of "allergy" to oral iron.  We will definitely need to be aggressive with premedicating her with IV and for IV iron.  I will give her steroids, Pepcid, Benadryl, all intravenous.  If she is truly B12 deficient, one would think that this would be from being a vegetarian.  She has not had any red meat for many years.  She is a strict vegetarian.  If we do find B12 deficiency I would start her on B12 protocol moving up to monthly B12 injections.  I do not believe that she needs to have a bone marrow test done.  I do not see anything with respect with her  history or blood smear that would suggest an underlying hematologic issue.  [name] is very eloquent.  She certainly is quite knowledgeable. It was fun talking with her.  I will give a call back once we get the lab results back.  I would like to be able to get her in next week for an iron infusion.  I would like to get her back in 6 weeks or so, at which point we should be seeing an improvement in her blood count.    ______________________________ Josph Macho, M.D. PRE/MEDQ  D:  12/05/2012  T:  12/06/2012  Job:  [id number] 

## 2012-12-08 ENCOUNTER — Ambulatory Visit (INDEPENDENT_AMBULATORY_CARE_PROVIDER_SITE_OTHER): Payer: Medicaid Other | Admitting: Gastroenterology

## 2012-12-08 ENCOUNTER — Encounter: Payer: Self-pay | Admitting: Gastroenterology

## 2012-12-08 ENCOUNTER — Telehealth: Payer: Self-pay | Admitting: Gastroenterology

## 2012-12-08 VITALS — BP 128/82 | HR 110 | Ht 70.0 in | Wt 361.0 lb

## 2012-12-08 DIAGNOSIS — K900 Celiac disease: Secondary | ICD-10-CM

## 2012-12-08 DIAGNOSIS — D649 Anemia, unspecified: Secondary | ICD-10-CM

## 2012-12-08 DIAGNOSIS — R1310 Dysphagia, unspecified: Secondary | ICD-10-CM | POA: Insufficient documentation

## 2012-12-08 MED ORDER — HYOSCYAMINE SULFATE 0.125 MG PO TABS: 0.1250 mg | ORAL_TABLET | ORAL | Status: DC | PRN

## 2012-12-08 MED ORDER — NA SULFATE-K SULFATE-MG SULF 17.5-3.13-1.6 GM/177ML PO SOLN: 1.0000 | Freq: Once | ORAL | Status: DC

## 2012-12-08 NOTE — Patient Instructions (Addendum)
You have been scheduled for your Colonoscopy and endoscopy Seperate instructions have been given

## 2012-12-08 NOTE — Telephone Encounter (Signed)
Med sent to pharmacy.

## 2012-12-08 NOTE — Progress Notes (Signed)
History of Present Illness: Pt is a 47-year-old white female referred at the request of Dr. Elkins for evaluation of anemia.  At a recent ED visit for chest pain and shortness of breath Hemoccult-positive stool and a microcytic anemia were noted.  Hemoglobin was 8.6, ferritin 5. She's complaining of fatigue and dyspnea on exertion. Patient has a history of iron deficiency and apparantly was transfused a couple of years ago. She's not had a menstrual period for over a year. Rarely has she seen blood mixed with the stools which she has attributed to a diarrheal illness and hemorrhoidal bleeding. She is on no  gastric irritants including nonsteroidals. She does complain of dysphagia to pills  solids. She has occasional pyrosis. Patient is a vegetarian. Recent evaluation by hematology determined that she was probably iron and B12 deficient.  She's also complaining of diffuse lower abdominal discomfort.  The patient was seen by me in 2011 for diarrhea. Question of celiac disease was raised. Colonoscopy was recommended but I don't see any report. Neither do I see celiac markers.    Past Medical History  Diagnosis Date  . Asthma   . Allergic rhinitis, cause unspecified   . Bipolar 1 disorder   . Morbid obesity   . OSA (obstructive sleep apnea)   . Thyroid nodule   . Mental disorder   . 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 Pt has never been tested.   . Anxiety    Past Surgical History  Procedure Laterality Date  . Tubal ligation    . Cesarean section    . Noraplant    . Wisdom tooth extraction     family history includes Asthma in her mother; Breast cancer in her mother; Diabetes in her father; and Emphysema in her paternal grandmother. Current Outpatient Prescriptions  Medication Sig Dispense Refill  . albuterol (PROVENTIL HFA;VENTOLIN HFA) 108 (90 BASE) MCG/ACT inhaler Inhale 2 puffs into the lungs 3 (three) times daily.       . beclomethasone (QVAR) 80 MCG/ACT inhaler Inhale 2 puffs  into the lungs 3 (three) times daily.      . diphenhydrAMINE (BENADRYL) 25 MG tablet Take 25 mg by mouth every 6 (six) hours as needed for itching.      . EPINEPHrine (EPI-PEN) 0.3 mg/0.3 mL DEVI Inject 0.3 mg into the muscle as needed (for allergic reaction).       . fluticasone (FLONASE) 50 MCG/ACT nasal spray Place 2 sprays into the nose daily.      . gabapentin (NEURONTIN) 300 MG capsule Take 300-600 mg by mouth 3 (three) times daily. 1 tablet (300 mg) every morning and at lunch, 2 tablets (600 mg) at night      . guaifenesin (HUMIBID E) 400 MG TABS Take 400 mg by mouth 3 (three) times daily.      . loratadine (CLARITIN) 10 MG tablet Take 10 mg by mouth daily.      . LORazepam (ATIVAN) 1 MG tablet Take 1 mg by mouth 3 (three) times daily as needed for anxiety.       . OIL OF OREGANO PO Take 1 capsule by mouth 3 (three) times daily.      . ondansetron (ZOFRAN) 4 MG tablet Take 4 mg by mouth 2 (two) times daily as needed for nausea (migraines).      . OVER THE COUNTER MEDICATION Take 1 capsule by mouth 3 (three) times daily. Stinging nettle for allergies      . OVER THE COUNTER   MEDICATION Take 1 capsule by mouth 3 (three) times daily. Papain - papaya and pineapple combination to aid digestion      . OVER THE COUNTER MEDICATION Take 1 capsule by mouth 3 (three) times daily. "lung function"      . pantoprazole (PROTONIX) 40 MG tablet Take 1 tablet (40 mg total) by mouth daily.  14 tablet  0  . predniSONE (DELTASONE) 20 MG tablet Take 20 mg by mouth daily. Tapered dose started 11/18/12:  1 tablet (20 mg) every day for 7 days, then taper down:  Every other day, then every 3rd day.      . rizatriptan (MAXALT-MLT) 10 MG disintegrating tablet Take 10 mg by mouth daily as needed for migraine. May repeat in 2 hours if needed for migraines      . traMADol (ULTRAM) 50 MG tablet Take 100 mg by mouth 3 (three) times daily.        No current facility-administered medications for this visit.   Allergies as of  12/08/2012 - Review Complete 12/08/2012  Allergen Reaction Noted  . Bupropion hcl Other (See Comments) 12/13/2008  . Capzasin (capsaicin) Other (See Comments) 11/24/2012  . Citalopram hydrobromide Other (See Comments)   . Other Other (See Comments) 11/24/2012  . Stevia (stevioside) Swelling 12/01/2012  . Lamictal (lamotrigine) Rash 04/26/2011  . Latex Other (See Comments) 05/10/2011    reports that she quit smoking about 6 years ago. Her smoking use included Cigarettes. She has a 5 pack-year smoking history. She has never used smokeless tobacco. She reports that she does not drink alcohol or use illicit drugs.     Review of Systems: Complains of body achiness active able to fibromyalgia. Pertinent positive and negative review of systems were noted in the above HPI section. All other review of systems were otherwise negative.  Vital signs were reviewed in today's medical record Physical Exam: General: She is an obese female in no acute distress Skin: anicteric Head: Normocephalic and atraumatic Eyes:  sclerae anicteric, EOMI Ears: Normal auditory acuity Mouth: No deformity or lesions Neck: Supple, no masses or thyromegaly Lungs: Clear throughout to auscultation Heart: Regular rate and rhythm; no murmurs, rubs or bruits Abdomen: Soft,  and non distended. No masses, hepatosplenomegaly or hernias noted. Normal Bowel sounds. It is mild tenderness in the left to right lower quadrants Rectal:deferred Musculoskeletal: Symmetrical with no gross deformities  Skin: No lesions on visible extremities Pulses:  Normal pulses noted Extremities: No clubbing, cyanosis, edema or deformities noted Neurological: Alert oriented x 4, grossly nonfocal Cervical Nodes:  No significant cervical adenopathy Inguinal Nodes: No significant inguinal adenopathy Psychological:  Alert and cooperative. Normal mood and affect         

## 2012-12-08 NOTE — Assessment & Plan Note (Addendum)
Patient clearly has an iron deficiency anemia and possibly B12 deficiency. Both could be attributed to her diet although patient is Hemoccult positive. Chronic GI bleeding sources should be ruled out including polyps, AVMs and neoplasm   Recommendations #1 colonoscopy #2 upper endoscopy if colonoscopy is not diagnostic

## 2012-12-08 NOTE — Assessment & Plan Note (Signed)
Rule out early peptic stricture  Recommendations #1 upper endoscopy with dilatation as indicated

## 2012-12-08 NOTE — Assessment & Plan Note (Addendum)
Question of celiac disease was raised in the past since diarrhea seemingly improved with a gluten-free diet. I will pursue further following   her GI workup.

## 2012-12-09 ENCOUNTER — Encounter: Payer: Self-pay | Admitting: Neurology

## 2012-12-09 ENCOUNTER — Ambulatory Visit (INDEPENDENT_AMBULATORY_CARE_PROVIDER_SITE_OTHER): Payer: Medicaid Other | Admitting: Neurology

## 2012-12-09 VITALS — BP 130/70 | HR 118 | Ht 69.0 in | Wt 363.0 lb

## 2012-12-09 DIAGNOSIS — R269 Unspecified abnormalities of gait and mobility: Secondary | ICD-10-CM | POA: Insufficient documentation

## 2012-12-09 LAB — HEMOGLOBINOPATHY EVALUATION
Hgb A: 97.8 % (ref 96.8–97.8)
Hgb S Quant: 0 %

## 2012-12-09 LAB — RETICULOCYTES (CHCC)
ABS Retic: 104.2 10*3/uL (ref 19.0–186.0)
RBC.: 3.86 MIL/uL — ABNORMAL LOW (ref 3.87–5.11)
Retic Ct Pct: 2.7 % — ABNORMAL HIGH (ref 0.4–2.3)

## 2012-12-09 LAB — FERRITIN: Ferritin: 5 ng/mL — ABNORMAL LOW (ref 10–291)

## 2012-12-09 MED ORDER — RIZATRIPTAN BENZOATE 10 MG PO TBDP: 10.0000 mg | ORAL_TABLET | Freq: Every day | ORAL | Status: DC | PRN

## 2012-12-09 NOTE — Patient Instructions (Signed)
Magnesium oxide 400mg, Riboflavin 100mg twice a day 

## 2012-12-09 NOTE — Progress Notes (Signed)
Stacey [name] is a 47 years old right-handed Caucasian female, referred by her primary care physician Dr. Jeannetta [name] for evaluation of frequent headaches.  She had past medical history of obesity, bipolar disorder, there was a major change in her bipolar medication in January 2014, she was taking lithium gabapentin, Ativan, but she did not up GI symptoms, nausea, vomiting, she was not able to take medicine in 2-3 weeks,    During that period of time, she began to complain of severe pounding headaches, light noise sensitivity, holocranial headaches, lasting few hours, sleep usually help, she also complains of diffuse body achy pain, worsening depression, she also complains of dizziness, lightheadedness, this led to MRI scan of brain that was normal.  She also complains of stomach pain, hemorroid. Was recently found to have iron and vitamin B12 deficiency anemia  She previously triedTopamax , complains of fatigue, sleepiness  Review of Systems  Out of a complete 14 system review, the patient complains of only the following symptoms, and all other reviewed systems are negative.   Constitutional:   Fever, chills, fatigue Cardiovascular:  Chest pain, palpitation, swellings in legs. Ear/Nose/Throat:  Ringing in ears, spinning sensation, trouble swallowing. Skin: rash, itching, moles. Eyes: blurry vision, eye pain. Respiratory:  Shortness of breath, cough, wheezing Gastroitestinal:  Blood in stool, diarrhea, constipation.  Hematology/Lymphatic:  Anemia. Easy bruising Endocrine:  increased thirst Musculoskeletal: Joints pain, joint swelling, cramps, achy muscles. Allergy/Immunology: Allergy, running nose, skin sensitivity. Neurological: Memory loss, confusion, headaches, numbness, weakness, insomnia, difficulty concentrating, dizziness Psychiatric:   Depression, anxiety, too much sleep, decreased energy, change in appetite, disinterested in activities, racing thought   PHYSICAL  EXAMINATOINS:  Generalized: In no acute distress  Neck: Supple, no carotid bruits   Cardiac: Regular rate rhythm  Pulmonary: Clear to auscultation bilaterally  Musculoskeletal: No deformity  Neurological examination  Mentation: Alert oriented to time, place, history taking, and causual conversation, obese, depressed looking.  Cranial nerve II-XII: Pupils were equal round reactive to light extraocular movements were full, visual field were full on confrontational test. facial sensation and strength were normal. hearing was intact to finger rubbing bilaterally. Uvula tongue midline.  head turning and shoulder shrug and were normal and symmetric.Tongue protrusion into cheek strength was normal.  Motor: normal tone, bulk and strength. Bilateral lower extremity pitting edema  Sensory: Intact to fine touch, pinprick, preserved vibratory sensation, and proprioception at toes.  Coordination: Normal finger to nose, heel-to-shin bilaterally there was no truncal ataxia  Gait: Rising up from seated position without assistance,  mild difficulty with gait due to big body habitus  Deep tendon reflexes: Brachioradialis 2/2, biceps 2/2, triceps 2/2, patellar 2/2, Achilles 2/2, plantar responses were flexor bilaterally.  Assessment and plan: 47 years old right-handed Caucasian female, with past medical history of obesity, bipolar disorder, worsening depression, Migraine headach recent diagnosis of iron deficiency, and B12 deficiency anemia, presenting with complaints of dizziness, frequent migraine headaches, normal MRI of the brain, essentially normal neurological examination, other than mild gait difficulty due to big body habitus, fatigue,  1. Migraine, I will add magnesium oxide, riboflavin twice a day to decrease the frequency and severity of her headaches. Maxalt as needed  2. she should optimize the control of her bipolar depression. 3. Physical therapy. 4. RTC in 6 month with Stacey [name]

## 2012-12-10 ENCOUNTER — Telehealth: Payer: Self-pay | Admitting: Hematology & Oncology

## 2012-12-10 ENCOUNTER — Other Ambulatory Visit: Payer: Self-pay | Admitting: Hematology & Oncology

## 2012-12-10 DIAGNOSIS — D509 Iron deficiency anemia, unspecified: Secondary | ICD-10-CM

## 2012-12-10 DIAGNOSIS — D510 Vitamin B12 deficiency anemia due to intrinsic factor deficiency: Secondary | ICD-10-CM

## 2012-12-10 DIAGNOSIS — Z9884 Bariatric surgery status: Secondary | ICD-10-CM

## 2012-12-10 NOTE — Telephone Encounter (Signed)
Pt is aware of 5-19 to 6-16 appointments

## 2012-12-10 NOTE — Telephone Encounter (Signed)
Left pt message to call for appointments that are starting next week.

## 2012-12-12 ENCOUNTER — Encounter (HOSPITAL_COMMUNITY): Payer: Self-pay | Admitting: *Deleted

## 2012-12-12 NOTE — Progress Notes (Signed)
11-24-2012 ekg and chest 2 view xray epic

## 2012-12-15 ENCOUNTER — Ambulatory Visit (HOSPITAL_BASED_OUTPATIENT_CLINIC_OR_DEPARTMENT_OTHER): Payer: Medicaid Other

## 2012-12-15 VITALS — BP 121/68 | HR 107 | Temp 97.0°F | Resp 18

## 2012-12-15 DIAGNOSIS — D509 Iron deficiency anemia, unspecified: Secondary | ICD-10-CM

## 2012-12-15 DIAGNOSIS — D649 Anemia, unspecified: Secondary | ICD-10-CM

## 2012-12-15 MED ORDER — SODIUM CHLORIDE 0.9 % IV SOLN
Freq: Once | INTRAVENOUS | Status: AC
  Administered 2012-12-15: 14:00:00 via INTRAVENOUS

## 2012-12-15 MED ORDER — DIPHENHYDRAMINE HCL 50 MG/ML IJ SOLN
25.0000 mg | Freq: Once | INTRAMUSCULAR | Status: AC
  Administered 2012-12-15: 25 mg via INTRAVENOUS

## 2012-12-15 MED ORDER — SODIUM CHLORIDE 0.9 % IV SOLN
1020.0000 mg | Freq: Once | INTRAVENOUS | Status: AC
  Administered 2012-12-15: 1020 mg via INTRAVENOUS
  Filled 2012-12-15: qty 34

## 2012-12-15 MED ORDER — FAMOTIDINE IN NACL 20-0.9 MG/50ML-% IV SOLN
20.0000 mg | Freq: Once | INTRAVENOUS | Status: AC
  Administered 2012-12-15: 20 mg via INTRAVENOUS

## 2012-12-15 MED ORDER — CYANOCOBALAMIN 1000 MCG/ML IJ SOLN
1000.0000 ug | Freq: Once | INTRAMUSCULAR | Status: AC
  Administered 2012-12-15: 1000 ug via INTRAMUSCULAR

## 2012-12-15 MED ORDER — METHYLPREDNISOLONE SODIUM SUCC 125 MG IJ SOLR
125.0000 mg | Freq: Once | INTRAMUSCULAR | Status: AC
  Administered 2012-12-15: 125 mg via INTRAVENOUS

## 2012-12-15 NOTE — Patient Instructions (Addendum)
Ferumoxytol injection What is this medicine? FERUMOXYTOL is an iron complex. Iron is used to make healthy red blood cells, which carry oxygen and nutrients throughout the body. This medicine is used to treat iron deficiency anemia in people with chronic kidney disease. This medicine may be used for other purposes; ask your health care provider or pharmacist if you have questions. What should I tell my health care provider before I take this medicine? They need to know if you have any of these conditions: -anemia not caused by low iron levels -high levels of iron in the blood -magnetic resonance imaging (MRI) test scheduled -an unusual or allergic reaction to iron, other medicines, foods, dyes, or preservatives -pregnant or trying to get pregnant -breast-feeding How should I use this medicine? This medicine is for infusion into a vein. It is given by a health care professional in a hospital or clinic setting. Talk to your pediatrician regarding the use of this medicine in children. Special care may be needed. Overdosage: If you think you've taken too much of this medicine contact a poison control center or emergency room at once. Overdosage: If you think you have taken too much of this medicine contact a poison control center or emergency room at once. NOTE: This medicine is only for you. Do not share this medicine with others. What if I miss a dose? It is important not to miss your dose. Call your doctor or health care professional if you are unable to keep an appointment. What may interact with this medicine? This medicine may interact with the following medications: -other iron products This list may not describe all possible interactions. Give your health care provider a list of all the medicines, herbs, non-prescription drugs, or dietary supplements you use. Also tell them if you smoke, drink alcohol, or use illegal drugs. Some items may interact with your medicine. What should  I watch for while using this medicine? Visit your doctor or healthcare professional regularly. Tell your doctor or healthcare professional if your symptoms do not start to get better or if they get worse. You may need blood work done while you are taking this medicine. You may need to follow a special diet. Talk to your doctor. Foods that contain iron include: whole grains/cereals, dried fruits, beans, or peas, leafy green vegetables, and organ meats (liver, kidney). What side effects may I notice from receiving this medicine? Side effects that you should report to your doctor or health care professional as soon as possible: -allergic reactions like skin rash, itching or hives, swelling of the face, lips, or tongue -breathing problems -changes in blood pressure -feeling faint or lightheaded, falls -fever or chills -flushing, sweating, or hot feelings -swelling of the ankles or feet Side effects that usually do not require medical attention (Report these to your doctor or health care professional if they continue or are bothersome.): -diarrhea -headache -nausea, vomiting -stomach pain This list may not describe all possible side effects. Call your doctor for medical advice about side effects. You may report side effects to FDA at 1-800-FDA-1088. Where should I keep my medicine? This drug is given in a hospital or clinic and will not be stored at home. NOTE: This sheet is a summary. It may not cover all possible information. If you have questions about this medicine, talk to your doctor, pharmacist, or health care provider.  2012, Elsevier/Gold Standard. (04/07/2008 9:48:25 PM)   Cyanocobalamin, Vitamin B12 injection What is this medicine? CYANOCOBALAMIN (sye an oh koe BAL a  min) is a man made form of vitamin B12. Vitamin B12 is used in the growth of healthy blood cells, nerve cells, and proteins in the body. It also helps with the metabolism of fats and carbohydrates. This medicine is used to  treat people who can not absorb vitamin B12. This medicine may be used for other purposes; ask your health care provider or pharmacist if you have questions. What should I tell my health care provider before I take this medicine? They need to know if you have any of these conditions: -kidney disease -Leber's disease -megaloblastic anemia -an unusual or allergic reaction to cyanocobalamin, cobalt, other medicines, foods, dyes, or preservatives -pregnant or trying to get pregnant -breast-feeding How should I use this medicine? This medicine is injected into a muscle or deeply under the skin. It is usually given by a health care professional in a clinic or doctor's office. However, your doctor may teach you how to inject yourself. Follow all instructions. Talk to your pediatrician regarding the use of this medicine in children. Special care may be needed. Overdosage: If you think you have taken too much of this medicine contact a poison control center or emergency room at once. NOTE: This medicine is only for you. Do not share this medicine with others. What if I miss a dose? If you are given your dose at a clinic or doctor's office, call to reschedule your appointment. If you give your own injections and you miss a dose, take it as soon as you can. If it is almost time for your next dose, take only that dose. Do not take double or extra doses. What may interact with this medicine? -colchicine -heavy alcohol intake This list may not describe all possible interactions. Give your health care provider a list of all the medicines, herbs, non-prescription drugs, or dietary supplements you use. Also tell them if you smoke, drink alcohol, or use illegal drugs. Some items may interact with your medicine. What should I watch for while using this medicine? Visit your doctor or health care professional regularly. You may need blood work done while you are taking this medicine. You may need to follow a  special diet. Talk to your doctor. Limit your alcohol intake and avoid smoking to get the best benefit. What side effects may I notice from receiving this medicine? Side effects that you should report to your doctor or health care professional as soon as possible: -allergic reactions like skin rash, itching or hives, swelling of the face, lips, or tongue -blue tint to skin -chest tightness, pain -difficulty breathing, wheezing -dizziness -red, swollen painful area on the leg Side effects that usually do not require medical attention (report to your doctor or health care professional if they continue or are bothersome): -diarrhea -headache This list may not describe all possible side effects. Call your doctor for medical advice about side effects. You may report side effects to FDA at 1-800-FDA-1088. Where should I keep my medicine? Keep out of the reach of children. Store at room temperature between 15 and 30 degrees C (59 and 85 degrees F). Protect from light. Throw away any unused medicine after the expiration date. NOTE: This sheet is a summary. It may not cover all possible information. If you have questions about this medicine, talk to your doctor, pharmacist, or health care provider.  2012, Elsevier/Gold Standard. (10/27/2007 10:10:20 PM)

## 2012-12-16 ENCOUNTER — Ambulatory Visit (HOSPITAL_BASED_OUTPATIENT_CLINIC_OR_DEPARTMENT_OTHER): Payer: Medicaid Other

## 2012-12-16 VITALS — BP 135/78 | HR 100 | Temp 97.6°F | Resp 16

## 2012-12-16 DIAGNOSIS — D649 Anemia, unspecified: Secondary | ICD-10-CM

## 2012-12-16 MED ORDER — CYANOCOBALAMIN 1000 MCG/ML IJ SOLN
1000.0000 ug | Freq: Once | INTRAMUSCULAR | Status: AC
  Administered 2012-12-16: 1000 ug via INTRAMUSCULAR

## 2012-12-17 ENCOUNTER — Ambulatory Visit (HOSPITAL_BASED_OUTPATIENT_CLINIC_OR_DEPARTMENT_OTHER): Payer: Medicaid Other

## 2012-12-17 VITALS — BP 152/90 | HR 112 | Temp 97.8°F | Resp 18

## 2012-12-17 DIAGNOSIS — D649 Anemia, unspecified: Secondary | ICD-10-CM

## 2012-12-17 MED ORDER — CYANOCOBALAMIN 1000 MCG/ML IJ SOLN
1000.0000 ug | Freq: Once | INTRAMUSCULAR | Status: AC
  Administered 2012-12-17: 1000 ug via INTRAMUSCULAR

## 2012-12-18 ENCOUNTER — Ambulatory Visit (HOSPITAL_BASED_OUTPATIENT_CLINIC_OR_DEPARTMENT_OTHER): Payer: Medicaid Other

## 2012-12-18 VITALS — BP 132/83 | HR 101 | Temp 97.7°F | Resp 22

## 2012-12-18 DIAGNOSIS — D649 Anemia, unspecified: Secondary | ICD-10-CM

## 2012-12-18 DIAGNOSIS — E538 Deficiency of other specified B group vitamins: Secondary | ICD-10-CM

## 2012-12-18 MED ORDER — CYANOCOBALAMIN 1000 MCG/ML IJ SOLN
1000.0000 ug | Freq: Once | INTRAMUSCULAR | Status: AC
  Administered 2012-12-18: 1000 ug via INTRAMUSCULAR

## 2012-12-19 ENCOUNTER — Ambulatory Visit (HOSPITAL_BASED_OUTPATIENT_CLINIC_OR_DEPARTMENT_OTHER): Payer: Medicaid Other

## 2012-12-19 ENCOUNTER — Encounter (HOSPITAL_COMMUNITY): Payer: Self-pay | Admitting: Pharmacy Technician

## 2012-12-19 VITALS — BP 142/81 | HR 108 | Temp 99.2°F | Resp 20

## 2012-12-19 DIAGNOSIS — D649 Anemia, unspecified: Secondary | ICD-10-CM

## 2012-12-19 MED ORDER — CYANOCOBALAMIN 1000 MCG/ML IJ SOLN
1000.0000 ug | Freq: Once | INTRAMUSCULAR | Status: AC
  Administered 2012-12-19: 1000 ug via INTRAMUSCULAR

## 2012-12-19 NOTE — Patient Instructions (Signed)

## 2012-12-23 ENCOUNTER — Other Ambulatory Visit: Payer: Self-pay | Admitting: Medical

## 2012-12-23 ENCOUNTER — Ambulatory Visit (HOSPITAL_BASED_OUTPATIENT_CLINIC_OR_DEPARTMENT_OTHER): Payer: Medicaid Other

## 2012-12-23 VITALS — BP 130/80 | HR 95 | Temp 97.3°F | Resp 20

## 2012-12-23 DIAGNOSIS — D649 Anemia, unspecified: Secondary | ICD-10-CM

## 2012-12-23 MED ORDER — ALTEPLASE 2 MG IJ SOLR
2.0000 mg | Freq: Once | INTRAMUSCULAR | Status: DC | PRN
  Filled 2012-12-23: qty 2

## 2012-12-23 MED ORDER — SODIUM CHLORIDE 0.9 % IJ SOLN
10.0000 mL | INTRAMUSCULAR | Status: DC | PRN
  Filled 2012-12-23: qty 10

## 2012-12-23 MED ORDER — SODIUM CHLORIDE 0.9 % IJ SOLN
3.0000 mL | Freq: Once | INTRAMUSCULAR | Status: DC | PRN
  Filled 2012-12-23: qty 10

## 2012-12-23 MED ORDER — HEPARIN SOD (PORK) LOCK FLUSH 100 UNIT/ML IV SOLN
500.0000 [IU] | Freq: Once | INTRAVENOUS | Status: DC | PRN
  Filled 2012-12-23: qty 5

## 2012-12-23 MED ORDER — CYANOCOBALAMIN 1000 MCG/ML IJ SOLN
1000.0000 ug | Freq: Once | INTRAMUSCULAR | Status: AC
  Administered 2012-12-23: 1000 ug via INTRAMUSCULAR

## 2012-12-23 MED ORDER — HEPARIN SOD (PORK) LOCK FLUSH 100 UNIT/ML IV SOLN
250.0000 [IU] | Freq: Once | INTRAVENOUS | Status: DC | PRN
  Filled 2012-12-23: qty 5

## 2012-12-23 NOTE — Patient Instructions (Signed)

## 2012-12-30 ENCOUNTER — Ambulatory Visit (HOSPITAL_BASED_OUTPATIENT_CLINIC_OR_DEPARTMENT_OTHER): Payer: Medicaid Other

## 2012-12-30 ENCOUNTER — Other Ambulatory Visit (HOSPITAL_BASED_OUTPATIENT_CLINIC_OR_DEPARTMENT_OTHER): Payer: Medicaid Other | Admitting: Lab

## 2012-12-30 ENCOUNTER — Encounter (HOSPITAL_COMMUNITY): Payer: Self-pay

## 2012-12-30 ENCOUNTER — Ambulatory Visit (HOSPITAL_COMMUNITY): Admit: 2012-12-30 | Payer: Self-pay | Admitting: Gastroenterology

## 2012-12-30 VITALS — BP 132/87 | HR 106 | Temp 97.0°F

## 2012-12-30 DIAGNOSIS — L659 Nonscarring hair loss, unspecified: Secondary | ICD-10-CM

## 2012-12-30 DIAGNOSIS — R5383 Other fatigue: Secondary | ICD-10-CM

## 2012-12-30 DIAGNOSIS — D649 Anemia, unspecified: Secondary | ICD-10-CM

## 2012-12-30 LAB — T4, FREE: Free T4: 1.07 ng/dL (ref 0.80–1.80)

## 2012-12-30 LAB — TSH: TSH: 4.815 u[IU]/mL — ABNORMAL HIGH (ref 0.350–4.500)

## 2012-12-30 SURGERY — COLONOSCOPY
Anesthesia: Moderate Sedation

## 2012-12-30 MED ORDER — CYANOCOBALAMIN 1000 MCG/ML IJ SOLN
1000.0000 ug | Freq: Once | INTRAMUSCULAR | Status: AC
  Administered 2012-12-30: 1000 ug via INTRAMUSCULAR

## 2012-12-30 NOTE — Patient Instructions (Signed)

## 2013-01-02 ENCOUNTER — Ambulatory Visit (HOSPITAL_COMMUNITY): Payer: Medicaid Other | Admitting: Anesthesiology

## 2013-01-02 ENCOUNTER — Encounter (HOSPITAL_COMMUNITY): Payer: Self-pay | Admitting: *Deleted

## 2013-01-02 ENCOUNTER — Encounter (HOSPITAL_COMMUNITY)
Admission: RE | Disposition: A | Discharge status: Home / Self Care | Payer: Self-pay | Source: Ambulatory Visit | Attending: Gastroenterology

## 2013-01-02 ENCOUNTER — Encounter (HOSPITAL_COMMUNITY): Payer: Self-pay | Admitting: Anesthesiology

## 2013-01-02 ENCOUNTER — Ambulatory Visit (HOSPITAL_COMMUNITY)
Admission: RE | Admit: 2013-01-02 | Discharge: 2013-01-02 | Disposition: A | Discharge status: Home / Self Care | Payer: Medicaid Other | Source: Ambulatory Visit | Attending: Gastroenterology | Admitting: Gastroenterology

## 2013-01-02 DIAGNOSIS — R197 Diarrhea, unspecified: Secondary | ICD-10-CM | POA: Insufficient documentation

## 2013-01-02 DIAGNOSIS — R5381 Other malaise: Secondary | ICD-10-CM | POA: Insufficient documentation

## 2013-01-02 DIAGNOSIS — R5383 Other fatigue: Secondary | ICD-10-CM | POA: Insufficient documentation

## 2013-01-02 DIAGNOSIS — R12 Heartburn: Secondary | ICD-10-CM | POA: Insufficient documentation

## 2013-01-02 DIAGNOSIS — D509 Iron deficiency anemia, unspecified: Secondary | ICD-10-CM | POA: Insufficient documentation

## 2013-01-02 DIAGNOSIS — R1310 Dysphagia, unspecified: Secondary | ICD-10-CM | POA: Insufficient documentation

## 2013-01-02 DIAGNOSIS — K900 Celiac disease: Secondary | ICD-10-CM

## 2013-01-02 DIAGNOSIS — K2980 Duodenitis without bleeding: Secondary | ICD-10-CM | POA: Insufficient documentation

## 2013-01-02 DIAGNOSIS — R0989 Other specified symptoms and signs involving the circulatory and respiratory systems: Secondary | ICD-10-CM | POA: Insufficient documentation

## 2013-01-02 DIAGNOSIS — D649 Anemia, unspecified: Secondary | ICD-10-CM

## 2013-01-02 DIAGNOSIS — R195 Other fecal abnormalities: Secondary | ICD-10-CM | POA: Insufficient documentation

## 2013-01-02 DIAGNOSIS — R0609 Other forms of dyspnea: Secondary | ICD-10-CM | POA: Insufficient documentation

## 2013-01-02 HISTORY — PX: COLONOSCOPY WITH PROPOFOL: SHX5780

## 2013-01-02 HISTORY — PX: ESOPHAGOGASTRODUODENOSCOPY: SHX5428

## 2013-01-02 HISTORY — DX: Fracture of nasal bones, initial encounter for closed fracture: S02.2XXA

## 2013-01-02 HISTORY — DX: Adverse effect of unspecified anesthetic, initial encounter: T41.45XA

## 2013-01-02 HISTORY — DX: Other specified health status: Z78.9

## 2013-01-02 SURGERY — EGD (ESOPHAGOGASTRODUODENOSCOPY)
Anesthesia: Monitor Anesthesia Care

## 2013-01-02 MED ORDER — PROPOFOL 10 MG/ML IV BOLUS
INTRAVENOUS | Status: DC | PRN
  Administered 2013-01-02: 100 mg via INTRAVENOUS

## 2013-01-02 MED ORDER — MIDAZOLAM HCL 5 MG/5ML IJ SOLN
INTRAMUSCULAR | Status: DC | PRN
  Administered 2013-01-02: 2 mg via INTRAVENOUS

## 2013-01-02 MED ORDER — PROPOFOL INFUSION 10 MG/ML OPTIME
INTRAVENOUS | Status: DC | PRN
  Administered 2013-01-02: 200 ug/kg/min via INTRAVENOUS

## 2013-01-02 MED ORDER — ONDANSETRON HCL 4 MG/2ML IJ SOLN
INTRAMUSCULAR | Status: DC | PRN
  Administered 2013-01-02: 4 mg via INTRAVENOUS

## 2013-01-02 MED ORDER — FENTANYL CITRATE 0.05 MG/ML IJ SOLN: 25.0000 ug | INTRAMUSCULAR | Status: DC | PRN

## 2013-01-02 MED ORDER — SODIUM CHLORIDE 0.9 % IV SOLN: INTRAVENOUS | Status: DC

## 2013-01-02 MED ORDER — LACTATED RINGERS IV SOLN
INTRAVENOUS | Status: DC
  Administered 2013-01-02: 1000 mL via INTRAVENOUS

## 2013-01-02 SURGICAL SUPPLY — 22 items

## 2013-01-02 NOTE — H&P (View-Only) (Signed)
History of Present Illness: Pt is a 47 year old white female referred at the request of Dr. Jeannetta Nap for evaluation of anemia.  At a recent ED visit for chest pain and shortness of breath Hemoccult-positive stool and a microcytic anemia were noted.  Hemoglobin was 8.6, ferritin 5. She's complaining of fatigue and dyspnea on exertion. Patient has a history of iron deficiency and apparantly was transfused a couple of years ago. She's not had a menstrual period for over a year. Rarely has she seen blood mixed with the stools which she has attributed to a diarrheal illness and hemorrhoidal bleeding. She is on no  gastric irritants including nonsteroidals. She does complain of dysphagia to pills  solids. She has occasional pyrosis. Patient is a vegetarian. Recent evaluation by hematology determined that she was probably iron and B12 deficient.  She's also complaining of diffuse lower abdominal discomfort.  The patient was seen by me in 2011 for diarrhea. Question of celiac disease was raised. Colonoscopy was recommended but I don't see any report. Neither do I see celiac markers.    Past Medical History  Diagnosis Date  . Asthma   . Allergic rhinitis, cause unspecified   . Bipolar 1 disorder   . Morbid obesity   . OSA (obstructive sleep apnea)   . Thyroid nodule   . Mental disorder   . 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 Pt has never been tested.   . Anxiety    Past Surgical History  Procedure Laterality Date  . Tubal ligation    . Cesarean section    . Noraplant    . Wisdom tooth extraction     family history includes Asthma in her mother; Breast cancer in her mother; Diabetes in her father; and Emphysema in her paternal grandmother. Current Outpatient Prescriptions  Medication Sig Dispense Refill  . albuterol (PROVENTIL HFA;VENTOLIN HFA) 108 (90 BASE) MCG/ACT inhaler Inhale 2 puffs into the lungs 3 (three) times daily.       . beclomethasone (QVAR) 80 MCG/ACT inhaler Inhale 2 puffs  into the lungs 3 (three) times daily.      . diphenhydrAMINE (BENADRYL) 25 MG tablet Take 25 mg by mouth every 6 (six) hours as needed for itching.      Marland Kitchen EPINEPHrine (EPI-PEN) 0.3 mg/0.3 mL DEVI Inject 0.3 mg into the muscle as needed (for allergic reaction).       . fluticasone (FLONASE) 50 MCG/ACT nasal spray Place 2 sprays into the nose daily.      Marland Kitchen gabapentin (NEURONTIN) 300 MG capsule Take 300-600 mg by mouth 3 (three) times daily. 1 tablet (300 mg) every morning and at lunch, 2 tablets (600 mg) at night      . guaifenesin (HUMIBID E) 400 MG TABS Take 400 mg by mouth 3 (three) times daily.      Marland Kitchen loratadine (CLARITIN) 10 MG tablet Take 10 mg by mouth daily.      Marland Kitchen LORazepam (ATIVAN) 1 MG tablet Take 1 mg by mouth 3 (three) times daily as needed for anxiety.       . OIL OF OREGANO PO Take 1 capsule by mouth 3 (three) times daily.      . ondansetron (ZOFRAN) 4 MG tablet Take 4 mg by mouth 2 (two) times daily as needed for nausea (migraines).      Marland Kitchen OVER THE COUNTER MEDICATION Take 1 capsule by mouth 3 (three) times daily. Stinging nettle for allergies      . OVER THE COUNTER  MEDICATION Take 1 capsule by mouth 3 (three) times daily. Papain - papaya and pineapple combination to aid digestion      . OVER THE COUNTER MEDICATION Take 1 capsule by mouth 3 (three) times daily. "lung function"      . pantoprazole (PROTONIX) 40 MG tablet Take 1 tablet (40 mg total) by mouth daily.  14 tablet  0  . predniSONE (DELTASONE) 20 MG tablet Take 20 mg by mouth daily. Tapered dose started 11/18/12:  1 tablet (20 mg) every day for 7 days, then taper down:  Every other day, then every 3rd day.      . rizatriptan (MAXALT-MLT) 10 MG disintegrating tablet Take 10 mg by mouth daily as needed for migraine. May repeat in 2 hours if needed for migraines      . traMADol (ULTRAM) 50 MG tablet Take 100 mg by mouth 3 (three) times daily.        No current facility-administered medications for this visit.   Allergies as of  12/08/2012 - Review Complete 12/08/2012  Allergen Reaction Noted  . Bupropion hcl Other (See Comments) 12/13/2008  . Capzasin (capsaicin) Other (See Comments) 11/24/2012  . Citalopram hydrobromide Other (See Comments)   . Other Other (See Comments) 11/24/2012  . Stevia (stevioside) Swelling 12/01/2012  . Lamictal (lamotrigine) Rash 04/26/2011  . Latex Other (See Comments) 05/10/2011    reports that she quit smoking about 6 years ago. Her smoking use included Cigarettes. She has a 5 pack-year smoking history. She has never used smokeless tobacco. She reports that she does not drink alcohol or use illicit drugs.     Review of Systems: Complains of body achiness active able to fibromyalgia. Pertinent positive and negative review of systems were noted in the above HPI section. All other review of systems were otherwise negative.  Vital signs were reviewed in today's medical record Physical Exam: General: She is an obese female in no acute distress Skin: anicteric Head: Normocephalic and atraumatic Eyes:  sclerae anicteric, EOMI Ears: Normal auditory acuity Mouth: No deformity or lesions Neck: Supple, no masses or thyromegaly Lungs: Clear throughout to auscultation Heart: Regular rate and rhythm; no murmurs, rubs or bruits Abdomen: Soft,  and non distended. No masses, hepatosplenomegaly or hernias noted. Normal Bowel sounds. It is mild tenderness in the left to right lower quadrants Rectal:deferred Musculoskeletal: Symmetrical with no gross deformities  Skin: No lesions on visible extremities Pulses:  Normal pulses noted Extremities: No clubbing, cyanosis, edema or deformities noted Neurological: Alert oriented x 4, grossly nonfocal Cervical Nodes:  No significant cervical adenopathy Inguinal Nodes: No significant inguinal adenopathy Psychological:  Alert and cooperative. Normal mood and affect

## 2013-01-02 NOTE — Anesthesia Postprocedure Evaluation (Signed)
  Anesthesia Post-op Note  Patient: Stacey [name]  Procedure(s) Performed: Procedure(s) (LRB): ESOPHAGOGASTRODUODENOSCOPY (EGD) (N/A) COLONOSCOPY WITH PROPOFOL (N/A)  Patient Location: PACU  Anesthesia Type: MAC  Level of Consciousness: awake and alert   Airway and Oxygen Therapy: Patient Spontanous Breathing  Post-op Pain: mild  Post-op Assessment: Post-op Vital signs reviewed, Patient's Cardiovascular Status Stable, Respiratory Function Stable, Patent Airway and No signs of Nausea or vomiting  Last Vitals:  Filed Vitals:   01/02/13 1300  BP: 126/79  Pulse:   Temp:   Resp: 13    Post-op Vital Signs: stable   Complications: No apparent anesthesia complications

## 2013-01-02 NOTE — Op Note (Signed)
[facility] [address] [place], [address]   COLONOSCOPY PROCEDURE REPORT  PATIENT: Stacey [name], Stacey [name]  MR#: [id number] BIRTHDATE: [birth year] , 47  yrs. old GENDER: Female ENDOSCOPIST: Louis Meckel, MD REFERRED [id number] Jeannetta Nap, M.D. PROCEDURE DATE:  01/02/2013 PROCEDURE:   Colonoscopy, diagnostic ASA CLASS:   Class II INDICATIONS:Iron Deficiency Anemia and heme-positive stool. MEDICATIONS: MAC sedation, administered by CRNA  DESCRIPTION OF PROCEDURE:   After the risks benefits and alternatives of the procedure were thoroughly explained, informed consent was obtained.  A digital rectal exam revealed no abnormalities of the rectum.   The Pentax Adult Colonscope [id number] endoscope was introduced through the anus and advanced to the cecum, which was identified by the ileocecal valve. No adverse events experienced.   The quality of the prep was excellent using Suprep  The instrument was then slowly withdrawn as the colon was fully examined.      COLON FINDINGS: A normal appearing cecum, ileocecal valve, and appendiceal orifice were identified.  The ascending, hepatic flexure, transverse, splenic flexure, descending, sigmoid colon and rectum appeared unremarkable.  No polyps or cancers were seen. Retroflexed views revealed no abnormalities. The time to cecum=  . Withdrawal time=5 minutes 0 seconds.  The scope was withdrawn and the procedure completed. COMPLICATIONS: There were no complications.  ENDOSCOPIC IMPRESSION: Normal colon  RECOMMENDATIONS: Upper endoscopy will be scheduled   eSigned:  Louis Meckel, MD 01/02/2013 12:44 PM   cc:

## 2013-01-02 NOTE — Anesthesia Preprocedure Evaluation (Addendum)
Anesthesia Evaluation  Patient identified by MRN, date of birth, ID band Patient awake    Reviewed: Allergy & Precautions, H&P , NPO status , Patient's Chart, lab work & pertinent test results  Airway Mallampati: III TM Distance: >3 FB Neck ROM: full    Dental no notable dental hx. (+) Teeth Intact and Dental Advisory Given   Pulmonary shortness of breath and with exertion, asthma , sleep apnea and Continuous Positive Airway Pressure Ventilation ,  Vocal cord dysfucnction treated with neurontin. breath sounds clear to auscultation  Pulmonary exam normal       Cardiovascular Exercise Tolerance: Good negative cardio ROS  Rhythm:regular Rate:Normal     Neuro/Psych Anxiety Bipolar Disorder negative neurological ROS  negative psych ROS   GI/Hepatic negative GI ROS, Neg liver ROS, (+)     substance abuse  alcohol use, dysphagia   Endo/Other  negative endocrine ROSMorbid obesity  Renal/GU negative Renal ROS  negative genitourinary   Musculoskeletal   Abdominal   Peds  Hematology negative hematology ROS (+)   Anesthesia Other Findings   Reproductive/Obstetrics negative OB ROS                          Anesthesia Physical Anesthesia Plan  ASA: III  Anesthesia Plan: MAC   Post-op Pain Management:    Induction:   Airway Management Planned: Simple Face Mask  Additional Equipment:   Intra-op Plan:   Post-operative Plan:   Informed Consent: I have reviewed the patients History and Physical, chart, labs and discussed the procedure including the risks, benefits and alternatives for the proposed anesthesia with the patient or authorized representative who has indicated his/her understanding and acceptance.   Dental Advisory Given  Plan Discussed with: CRNA and Surgeon  Anesthesia Plan Comments:         Anesthesia Quick Evaluation

## 2013-01-02 NOTE — Interval H&P Note (Signed)
History and Physical Interval Note:  01/02/2013 11:56 AM  Pincus Large F Bellino  has presented today for surgery, with the diagnosis of Dysphagia [787.20]  The various methods of treatment have been discussed with the patient and family. After consideration of risks, benefits and other options for treatment, the patient has consented to  Procedure(s): ESOPHAGOGASTRODUODENOSCOPY (EGD) (N/A) BALLOON DILATION (N/A) COLONOSCOPY WITH PROPOFOL (N/A) as a surgical intervention .  The patient's history has been reviewed, patient examined, no change in status, stable for surgery.  I have reviewed the patient's chart and labs.  Questions were answered to the patient's satisfaction.     The recent H&P (dated *12/08/12**) was reviewed, the patient was examined and there is no change in the patients condition since that H&P was completed.   Melvia Heaps  01/02/2013, 11:56 AM   Melvia Heaps

## 2013-01-02 NOTE — Transfer of Care (Signed)
Immediate Anesthesia Transfer of Care Note  Patient: Stacey [name]  Procedure(s) Performed: Procedure(s): ESOPHAGOGASTRODUODENOSCOPY (EGD) (N/A) COLONOSCOPY WITH PROPOFOL (N/A)  Patient Location: PACU  Anesthesia Type:MAC  Level of Consciousness: awake, alert , oriented and patient cooperative  Airway & Oxygen Therapy: Patient Spontanous Breathing  Post-op Assessment: Report given to PACU RN and Post -op Vital signs reviewed and stable  Post vital signs: Reviewed and stable  Complications: No apparent anesthesia complications

## 2013-01-02 NOTE — Op Note (Signed)
[facility] [address] [place], [address]   ENDOSCOPY PROCEDURE REPORT  PATIENT: Stacey [name], Stacey [name]  MR#: [id number] BIRTHDATE: 1965-12-22 , 47  yrs. old GENDER: Female ENDOSCOPIST: Louis Meckel, MD REFERRED BY:  Windle Guard, M.D. PROCEDURE DATE:  01/02/2013 PROCEDURE:  EGD w/ biopsy ASA CLASS:     Class II INDICATIONS:  Iron deficiency anemia.   Occult blood positive. Dysphagia. MEDICATIONS: MAC sedation, administered by CRNA TOPICAL ANESTHETIC:  DESCRIPTION OF PROCEDURE: After the risks benefits and alternatives of the procedure were thoroughly explained, informed consent was obtained.  The Pentax Gastroscope [id number] endoscope was introduced through the mouth and advanced to the third portion of the duodenum. Without limitations.  The instrument was slowly withdrawn as the mucosa was fully examined.      The upper, middle and distal third of the esophagus were carefully inspected and no abnormalities were noted.  The z-line was well seen at the GEJ.  The endoscope was pushed into the fundus which was normal including a retroflexed view.  The antrum, gastric body, first and second part of the duodenum were unremarkable. Random biopsies were taken of the duodenum to r/o celiac disease. Retroflexed views revealed no abnormalities.     The scope was then withdrawn from the patient and the procedure completed.  COMPLICATIONS: There were no complications. ENDOSCOPIC IMPRESSION: Normal EGD  RECOMMENDATIONS: 1.  Await biopsy results 2.  hemeoccults in 1 week  REPEAT EXAM:  eSigned:  Louis Meckel, MD 01/02/2013 12:47 PM   CC:

## 2013-01-05 ENCOUNTER — Encounter (HOSPITAL_COMMUNITY): Payer: Self-pay | Admitting: Gastroenterology

## 2013-01-05 ENCOUNTER — Other Ambulatory Visit: Payer: Self-pay | Admitting: Gastroenterology

## 2013-01-05 DIAGNOSIS — D649 Anemia, unspecified: Secondary | ICD-10-CM

## 2013-01-06 ENCOUNTER — Ambulatory Visit (HOSPITAL_BASED_OUTPATIENT_CLINIC_OR_DEPARTMENT_OTHER): Payer: Medicaid Other

## 2013-01-06 ENCOUNTER — Telehealth: Payer: Self-pay | Admitting: Hematology & Oncology

## 2013-01-06 VITALS — BP 130/82 | HR 90 | Temp 97.7°F

## 2013-01-06 DIAGNOSIS — D649 Anemia, unspecified: Secondary | ICD-10-CM

## 2013-01-06 DIAGNOSIS — E538 Deficiency of other specified B group vitamins: Secondary | ICD-10-CM

## 2013-01-06 MED ORDER — CYANOCOBALAMIN 1000 MCG/ML IJ SOLN
1000.0000 ug | Freq: Once | INTRAMUSCULAR | Status: AC
  Administered 2013-01-06: 1000 ug via INTRAMUSCULAR

## 2013-01-06 NOTE — Patient Instructions (Addendum)

## 2013-01-06 NOTE — Telephone Encounter (Signed)
Amy (Rn) from [facility] Dr.Wilson Elkin's office called and request last lab and office note to be faxed over to [phone].

## 2013-01-07 ENCOUNTER — Telehealth: Payer: Self-pay | Admitting: Gastroenterology

## 2013-01-07 NOTE — Telephone Encounter (Signed)
Pt had questions about how to complete the stool cards. Questions answered.

## 2013-01-09 ENCOUNTER — Telehealth: Payer: Self-pay | Admitting: Hematology & Oncology

## 2013-01-09 NOTE — Telephone Encounter (Signed)
Message copied by Cathi Roan on Fri Jan 09, 2013 11:11 AM ------      Message from: [place], [place] N      Created: Tue Jan 06, 2013 11:03 AM                   ----- Message -----         From: Josph Macho, MD         Sent: 01/05/2013   6:07 PM           To: Nanci Pina Nurse Hp            Please call and let her know that her thyroid tests looked okay. Thanks. Pete ------

## 2013-01-12 ENCOUNTER — Ambulatory Visit: Payer: Medicaid Other | Admitting: Hematology & Oncology

## 2013-01-12 ENCOUNTER — Telehealth: Payer: Self-pay | Admitting: Hematology & Oncology

## 2013-01-12 ENCOUNTER — Other Ambulatory Visit: Payer: Medicaid Other | Admitting: Lab

## 2013-01-12 ENCOUNTER — Ambulatory Visit: Payer: Medicaid Other

## 2013-01-12 NOTE — Telephone Encounter (Addendum)
Message copied by Cathi Roan on Mon Jan 12, 2013  2:30 PM ------      Message from: [place], [place] N      Created: Tue Jan 06, 2013 11:03 AM                   ----- Message -----         From: Josph Macho, MD         Sent: 01/05/2013   6:07 PM           To: Nanci Pina Nurse Hp            Please call and let her know that her thyroid tests looked okay. Thanks. Cindee Lame  01-12-13 Called and spoke with patient regarding above MD message, patient had no questions. S.Trynosky LPN ------

## 2013-01-12 NOTE — Telephone Encounter (Signed)
Patient called and cx 01/12/13 apt and stated she will call back to resch.  rn was notified of cx apt

## 2013-01-22 ENCOUNTER — Telehealth: Payer: Self-pay | Admitting: Hematology & Oncology

## 2013-01-22 NOTE — Telephone Encounter (Signed)
Pt wanting to schedule lab for 01-23-13. She had cx her 6-16 appointments. Per Amy RN pt aware we will call her back, RN has to check with MD

## 2013-02-12 ENCOUNTER — Encounter (HOSPITAL_COMMUNITY): Payer: Self-pay

## 2013-02-12 ENCOUNTER — Emergency Department (HOSPITAL_COMMUNITY)
Admission: EM | Admit: 2013-02-12 | Discharge: 2013-02-12 | Disposition: A | Discharge status: Home / Self Care | Payer: Medicaid Other | Attending: Emergency Medicine | Admitting: Emergency Medicine

## 2013-02-12 DIAGNOSIS — R259 Unspecified abnormal involuntary movements: Secondary | ICD-10-CM | POA: Insufficient documentation

## 2013-02-12 DIAGNOSIS — G4733 Obstructive sleep apnea (adult) (pediatric): Secondary | ICD-10-CM | POA: Insufficient documentation

## 2013-02-12 DIAGNOSIS — Z8639 Personal history of other endocrine, nutritional and metabolic disease: Secondary | ICD-10-CM | POA: Insufficient documentation

## 2013-02-12 DIAGNOSIS — B372 Candidiasis of skin and nail: Secondary | ICD-10-CM

## 2013-02-12 DIAGNOSIS — F411 Generalized anxiety disorder: Secondary | ICD-10-CM | POA: Insufficient documentation

## 2013-02-12 DIAGNOSIS — F319 Bipolar disorder, unspecified: Secondary | ICD-10-CM | POA: Insufficient documentation

## 2013-02-12 DIAGNOSIS — J45909 Unspecified asthma, uncomplicated: Secondary | ICD-10-CM | POA: Insufficient documentation

## 2013-02-12 DIAGNOSIS — T380X5A Adverse effect of glucocorticoids and synthetic analogues, initial encounter: Secondary | ICD-10-CM | POA: Insufficient documentation

## 2013-02-12 DIAGNOSIS — Z79899 Other long term (current) drug therapy: Secondary | ICD-10-CM | POA: Insufficient documentation

## 2013-02-12 DIAGNOSIS — J3489 Other specified disorders of nose and nasal sinuses: Secondary | ICD-10-CM | POA: Insufficient documentation

## 2013-02-12 DIAGNOSIS — Z8781 Personal history of (healed) traumatic fracture: Secondary | ICD-10-CM | POA: Insufficient documentation

## 2013-02-12 DIAGNOSIS — IMO0002 Reserved for concepts with insufficient information to code with codable children: Secondary | ICD-10-CM | POA: Insufficient documentation

## 2013-02-12 DIAGNOSIS — Z87891 Personal history of nicotine dependence: Secondary | ICD-10-CM | POA: Insufficient documentation

## 2013-02-12 DIAGNOSIS — R21 Rash and other nonspecific skin eruption: Secondary | ICD-10-CM | POA: Insufficient documentation

## 2013-02-12 DIAGNOSIS — Z862 Personal history of diseases of the blood and blood-forming organs and certain disorders involving the immune mechanism: Secondary | ICD-10-CM | POA: Insufficient documentation

## 2013-02-12 DIAGNOSIS — Z91040 Latex allergy status: Secondary | ICD-10-CM | POA: Insufficient documentation

## 2013-02-12 DIAGNOSIS — D649 Anemia, unspecified: Secondary | ICD-10-CM | POA: Insufficient documentation

## 2013-02-12 LAB — POCT I-STAT, CHEM 8
Calcium, Ion: 1.1 mmol/L — ABNORMAL LOW (ref 1.12–1.23)
Chloride: 104 mEq/L (ref 96–112)
HCT: 34 % — ABNORMAL LOW (ref 36.0–46.0)
Potassium: 4.8 mEq/L (ref 3.5–5.1)
Sodium: 139 mEq/L (ref 135–145)

## 2013-02-12 MED ORDER — NYSTATIN 100000 UNIT/GM EX CREA: TOPICAL_CREAM | CUTANEOUS | Status: DC

## 2013-02-12 NOTE — ED Notes (Signed)
Pt also states that she's hearing voices but nothing specific and her hands and feet are numb.

## 2013-02-12 NOTE — ED Provider Notes (Signed)
Medical screening examination/treatment/procedure(s) were performed by non-physician practitioner and as supervising physician I was immediately available for consultation/collaboration.   Lyanne Co, MD 02/12/13 [phone]

## 2013-02-12 NOTE — ED Notes (Signed)
Pt states that she's been on prednisone for a couple of weeks for allergies and tonight she had a temp of 100.5 at home, she also states that she's anemic. She complains of a rash around her csection scar, child is 47 yrs old.

## 2013-02-12 NOTE — ED Provider Notes (Signed)
History    CSN: [id number] Arrival date & time 02/12/13  0217  First MD Initiated Contact with Patient 02/12/13 0232     Chief Complaint  Patient presents with  . Fever  . Anemia   HPI  History provided by the patient. The patient is a 47 year old female with history of morbid obesity, bipolar disorder, seasonal allergies, asthma and anemia who presents with concerns for elevated temperature, difficulty sleeping and some increased chills and shakiness of body. Patient states she has been on low-dose prednisone for the past week or more related to allergy and sinus issues. She recently reduced her dose from 30 mg a day to 15. She states that while this medication she often has difficulty sleeping. Tonight however she felt warm and took her temperature which was 100.5 at home. She also reports some increased uneasiness in chills and body. Patient also mentions hearing some voices at times that are nonspecific. She states this is common for her bipolar disorder in the past. She denies any HI or SI. She denies any other symptoms.    Past Medical History  Diagnosis Date  . Asthma   . Allergic rhinitis, cause unspecified   . Bipolar 1 disorder   . Morbid obesity   . Thyroid nodule   . Mental disorder   . 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 Pt has never been tested.   . Anxiety   . OSA (obstructive sleep apnea)     uses cpap does not know settings  . Complication of anesthesia 2004    bp dropped after c section, took 24hrs to feels leg , numbing medicine does not last long when injected into skin  . Difficult intravenous access     hard stick for ivs and lab per pt  . Nasal bone fracture age 39    left side cannot place tube in nostril per pt   Past Surgical History  Procedure Laterality Date  . Tubal ligation    . Cesarean section    . Noraplant    . Wisdom tooth extraction    . Esophagogastroduodenoscopy N/A 01/02/2013    Procedure: ESOPHAGOGASTRODUODENOSCOPY (EGD);   Surgeon: Louis Meckel, MD;  Location: [facility];  Service: Endoscopy;  Laterality: N/A;  . Colonoscopy with propofol N/A 01/02/2013    Procedure: COLONOSCOPY WITH PROPOFOL;  Surgeon: Louis Meckel, MD;  Location: [facility];  Service: Endoscopy;  Laterality: N/A;   Family History  Problem Relation Age of Onset  . Breast cancer Mother   . Asthma Mother   . Emphysema Paternal Grandmother   . Diabetes Father    History  Substance Use Topics  . Smoking status: Former Smoker -- 0.50 packs/day for 10 years    Types: Cigarettes    Quit date: 07/30/2006  . Smokeless tobacco: Never Used  . Alcohol Use: No   OB History   Grav Para Term Preterm Abortions TAB SAB Ect Mult Living   10 6 6  4  4   6      Review of Systems  Constitutional: Positive for fever.  HENT: Positive for congestion. Negative for sore throat, rhinorrhea, neck pain and sinus pressure.   Gastrointestinal: Negative for nausea, vomiting, abdominal pain, diarrhea and constipation.  Skin: Positive for rash.  Neurological: Positive for tremors. Negative for headaches.  All other systems reviewed and are negative.    Allergies  Bupropion hcl; Capzasin; Citalopram hydrobromide; Other; Stevia; Strawberry; Lamictal; and Latex  Home Medications  Current Outpatient Rx  Name  Route  Sig  Dispense  Refill  . albuterol (PROVENTIL HFA;VENTOLIN HFA) 108 (90 BASE) MCG/ACT inhaler   Inhalation   Inhale 2 puffs into the lungs 3 (three) times daily.          . beclomethasone (QVAR) 80 MCG/ACT inhaler   Inhalation   Inhale 2 puffs into the lungs 3 (three) times daily.         . diphenhydrAMINE (BENADRYL) 25 MG tablet   Oral   Take 25 mg by mouth every 6 (six) hours as needed for itching.         . docusate sodium (COLACE) 100 MG capsule   Oral   Take 100 mg by mouth 2 (two) times daily.         Marland Kitchen EPINEPHrine (EPI-PEN) 0.3 mg/0.3 mL DEVI   Intramuscular   Inject 0.3 mg into the muscle as needed (for  allergic reaction).          . fluticasone (FLONASE) 50 MCG/ACT nasal spray   Nasal   Place 2 sprays into the nose as needed for allergies.          Marland Kitchen gabapentin (NEURONTIN) 300 MG capsule   Oral   Take 300-600 mg by mouth 3 (three) times daily. 1 tablet (300 mg) every morning and at lunch, 2 capsules (600 mg) at night         . guaifenesin (HUMIBID E) 400 MG TABS   Oral   Take 400 mg by mouth 3 (three) times daily.         . hyoscyamine (LEVSIN, ANASPAZ) 0.125 MG tablet   Oral   Take 0.125 mg by mouth every 4 (four) hours as needed for cramping.         . loratadine (CLARITIN) 10 MG tablet   Oral   Take 10 mg by mouth every morning.          Marland Kitchen LORazepam (ATIVAN) 1 MG tablet   Oral   Take 1 mg by mouth 3 (three) times daily as needed for anxiety.          . Na Sulfate-K Sulfate-Mg Sulf SOLN   Oral   Take 1 kit by mouth once.         . OIL OF OREGANO PO   Oral   Take 1 capsule by mouth 3 (three) times daily.         . ondansetron (ZOFRAN) 4 MG tablet   Oral   Take 4 mg by mouth 2 (two) times daily as needed for nausea (migraines).         Marland Kitchen OVER THE COUNTER MEDICATION   Oral   Take 1 capsule by mouth 3 (three) times daily. Stinging nettle for allergies         . OVER THE COUNTER MEDICATION   Oral   Take 1 capsule by mouth 3 (three) times daily. Papain - papaya and pineapple combination to aid digestion         . OVER THE COUNTER MEDICATION   Oral   Take 1 capsule by mouth 3 (three) times daily. "lung function"         . pantoprazole (PROTONIX) 40 MG tablet   Oral   Take 40 mg by mouth every morning.         . predniSONE (DELTASONE) 20 MG tablet   Oral   Take 20 mg by mouth daily. Tapered dose started 11/18/12:  1 tablet (20 mg)  every day for 7 days, then taper down:  Every other day, then every 3rd day.         . rizatriptan (MAXALT-MLT) 10 MG disintegrating tablet   Oral   Take 10 mg by mouth daily as needed for migraine. May  repeat in 2 hours if needed for migraines         . traMADol (ULTRAM) 50 MG tablet   Oral   Take 100 mg by mouth 3 (three) times daily.           BP 140/99  Pulse 106  Temp(Src) 99.4 F (37.4 C) (Oral)  Resp 20  Ht 5\' 10"  (1.778 m)  Wt 365 lb (165.563 kg)  BMI 52.37 kg/m2  SpO2 98%  LMP 02/12/2013 Physical Exam  Nursing note and vitals reviewed. Constitutional: She is oriented to person, place, and time. She appears well-developed and well-nourished. No distress.  HENT:  Head: Normocephalic.  Mouth/Throat: Oropharynx is clear and moist.  Neck: Normal range of motion. Neck supple.  No meningeal signs  Cardiovascular: Normal rate and regular rhythm.   Pulmonary/Chest: Effort normal and breath sounds normal. No respiratory distress. She has no wheezes. She has no rales.  Abdominal: Soft. She exhibits no distension. There is no tenderness. There is no rebound.  Neurological: She is alert and oriented to person, place, and time.  Skin: Skin is warm and dry. Rash noted.  Large confluent area of erythema with well-defined borders within the folds of the pannus on the left lower abdomen.  Psychiatric: She has a normal mood and affect. Her behavior is normal.    ED Course  Procedures   Results for orders placed during the hospital encounter of 02/12/13  POCT I-STAT, CHEM 8      Result Value Range   Sodium 139  135 - 145 mEq/L   Potassium 4.8  3.5 - 5.1 mEq/L   Chloride 104  96 - 112 mEq/L   BUN 13  6 - 23 mg/dL   Creatinine, Ser 1.61  0.50 - 1.10 mg/dL   Glucose, Bld 096 (*) 70 - 99 mg/dL   Calcium, Ion 0.45 (*) 1.12 - 1.23 mmol/L   TCO2 27  0 - 100 mmol/L   Hemoglobin 11.6 (*) 12.0 - 15.0 g/dL   HCT 40.9 (*) 81.1 - 91.4 %        1. Candidiasis, intertrigo   2. Prednisone adverse reaction, initial encounter     MDM  2:45 AM patient seen and evaluated. Patient appears well in no acute distress. Does not appear severely ill or toxic. She has normal oral  temperature at triage.  Angus Seller, PA-C 02/12/13 [phone]

## 2013-02-16 ENCOUNTER — Telehealth: Payer: Self-pay | Admitting: Hematology & Oncology

## 2013-02-16 NOTE — Telephone Encounter (Signed)
Pt made 7-28 appointment transferred to RN for questions about lab results

## 2013-02-23 ENCOUNTER — Ambulatory Visit (HOSPITAL_BASED_OUTPATIENT_CLINIC_OR_DEPARTMENT_OTHER): Payer: Medicaid Other

## 2013-02-23 ENCOUNTER — Other Ambulatory Visit (HOSPITAL_BASED_OUTPATIENT_CLINIC_OR_DEPARTMENT_OTHER): Payer: Medicaid Other | Admitting: Lab

## 2013-02-23 ENCOUNTER — Ambulatory Visit (HOSPITAL_BASED_OUTPATIENT_CLINIC_OR_DEPARTMENT_OTHER): Payer: Medicaid Other | Admitting: Hematology & Oncology

## 2013-02-23 VITALS — BP 116/76 | HR 110 | Temp 98.2°F | Resp 18 | Ht 70.0 in | Wt 369.0 lb

## 2013-02-23 DIAGNOSIS — D509 Iron deficiency anemia, unspecified: Secondary | ICD-10-CM

## 2013-02-23 DIAGNOSIS — D508 Other iron deficiency anemias: Secondary | ICD-10-CM

## 2013-02-23 DIAGNOSIS — D649 Anemia, unspecified: Secondary | ICD-10-CM

## 2013-02-23 DIAGNOSIS — D510 Vitamin B12 deficiency anemia due to intrinsic factor deficiency: Secondary | ICD-10-CM

## 2013-02-23 DIAGNOSIS — Z9884 Bariatric surgery status: Secondary | ICD-10-CM

## 2013-02-23 LAB — RETICULOCYTES (CHCC): RBC.: 4.34 MIL/uL (ref 3.87–5.11)

## 2013-02-23 LAB — CBC WITH DIFFERENTIAL (CANCER CENTER ONLY)
BASO%: 0.4 % (ref 0.0–2.0)
EOS%: 1.2 % (ref 0.0–7.0)
HCT: 33.8 % — ABNORMAL LOW (ref 34.8–46.6)
LYMPH#: 1.1 10*3/uL (ref 0.9–3.3)
MCHC: 29.9 g/dL — ABNORMAL LOW (ref 32.0–36.0)
MONO%: 5.1 % (ref 0.0–13.0)
NEUT#: 8.8 10*3/uL — ABNORMAL HIGH (ref 1.5–6.5)
NEUT%: 82.7 % — ABNORMAL HIGH (ref 39.6–80.0)
RDW: 16.2 % — ABNORMAL HIGH (ref 11.1–15.7)

## 2013-02-23 MED ORDER — CYANOCOBALAMIN 1000 MCG/ML IJ SOLN
1000.0000 ug | Freq: Once | INTRAMUSCULAR | Status: AC
  Administered 2013-02-23: 1000 ug via INTRAMUSCULAR

## 2013-02-24 ENCOUNTER — Encounter: Payer: Self-pay | Admitting: Hematology & Oncology

## 2013-02-24 DIAGNOSIS — D510 Vitamin B12 deficiency anemia due to intrinsic factor deficiency: Secondary | ICD-10-CM

## 2013-02-24 DIAGNOSIS — D509 Iron deficiency anemia, unspecified: Secondary | ICD-10-CM

## 2013-02-24 HISTORY — DX: Vitamin B12 deficiency anemia due to intrinsic factor deficiency: D51.0

## 2013-02-24 HISTORY — DX: Iron deficiency anemia, unspecified: D50.9

## 2013-02-24 LAB — IRON AND TIBC CHCC
%SAT: 2 % — ABNORMAL LOW (ref 21–57)
Iron: 6 ug/dL — ABNORMAL LOW (ref 41–142)
UIBC: 364 ug/dL (ref 120–384)

## 2013-02-24 NOTE — Progress Notes (Signed)
This office note has been dictated.

## 2013-02-25 ENCOUNTER — Other Ambulatory Visit: Payer: Self-pay | Admitting: Hematology & Oncology

## 2013-02-25 ENCOUNTER — Telehealth: Payer: Self-pay | Admitting: Hematology & Oncology

## 2013-02-25 DIAGNOSIS — E041 Nontoxic single thyroid nodule: Secondary | ICD-10-CM

## 2013-02-25 DIAGNOSIS — R109 Unspecified abdominal pain: Secondary | ICD-10-CM

## 2013-02-25 NOTE — Progress Notes (Signed)
CC:   Stacey [name], M.D.  DIAGNOSES: 1. Iron-deficiency anemia. 2. B12 deficiency.  CURRENT THERAPY: 1. IV iron as indicated. 2. Vitamin B12 monthly.  INTERIM HISTORY:  Stacey [name] comes in for followup.  She has some issues that prevent her from coming to the office on a scheduled basis. She is having some asthma issues.  When we first saw her back in May, we found that she was definitely iron deficient.  She got IV iron.  She tolerated this well.  We started her on B12.  She reports she cannot get her program completed because of asthma attacks.  She is having occasional cycles.  They seem to be infrequent.  She does chew ice.  She has had no fever.  There has been a lot of increase in leg swelling.  PHYSICAL EXAMINATION:  General:  This is a morbidly obese white female in no obvious distress.  Vital signs:  Temperature of 98.2, pulse 110, respiratory rate 18, blood pressure 116/76.  Weight is 369.  Head and neck:  Normocephalic, atraumatic skull.  There are no ocular or oral lesions.  There are no palpable cervical or supraclavicular lymph nodes. Lungs:  Clear bilaterally.  Cardiac:  Regular rate and rhythm with a normal S1 and S2.  There are no murmurs, rubs or bruits.  Abdomen: Obese.  She has good bowel sounds.  There is no fluid wave.  There is no palpable hepatosplenomegaly.  Extremities shows 2+ nonpitting edema of the lower legs and feet.  Neurologic:  No focal neurological deficits.  LABORATORY STUDIES:  White cell count is 10.7, hemoglobin 10.1, hematocrit 33.8, platelet count 374.  MCV is 79.  Ferritin is 9 with an iron saturation of 2%.  B12 is 297.  IMPRESSION:  Stacey [name] is a very nice 47 year old white female. Again, she is iron deficient.  We will have to get her in for, I think, 2 doses of Feraheme.  I think she is going to need quite a bit of iron to replace her iron stores.  We will try to get her set up this week for the iron.  We  will plan to get her back monthly for B12.  I think this would be reasonable.  We will plan to get Stacey [name] back to see [place] in a couple of months.  By then, we should be getting her iron stores replaced.    ______________________________ Josph Macho, M.D. PRE/MEDQ  D:  02/24/2013  T:  02/25/2013  Job:  [id number]

## 2013-02-25 NOTE — Telephone Encounter (Signed)
Pt aware of 7-31 and 8-7 iron infusion. She is aware of 8-25 inj and 9-26 MD. Pt mentioned 2 [place] appointments she also needed. I left RN voice mail to check on US's

## 2013-02-26 ENCOUNTER — Telehealth: Payer: Self-pay | Admitting: Hematology & Oncology

## 2013-02-26 ENCOUNTER — Ambulatory Visit (HOSPITAL_BASED_OUTPATIENT_CLINIC_OR_DEPARTMENT_OTHER): Payer: Medicaid Other

## 2013-02-26 VITALS — BP 136/87 | HR 98 | Temp 98.0°F | Resp 18

## 2013-02-26 DIAGNOSIS — D649 Anemia, unspecified: Secondary | ICD-10-CM

## 2013-02-26 DIAGNOSIS — D509 Iron deficiency anemia, unspecified: Secondary | ICD-10-CM

## 2013-02-26 MED ORDER — SODIUM CHLORIDE 0.9 % IV SOLN
1020.0000 mg | Freq: Once | INTRAVENOUS | Status: AC
  Administered 2013-02-26: 1020 mg via INTRAVENOUS
  Filled 2013-02-26: qty 34

## 2013-02-26 NOTE — Patient Instructions (Signed)
Ferumoxytol injection What is this medicine? FERUMOXYTOL is an iron complex. Iron is used to make healthy red blood cells, which carry oxygen and nutrients throughout the body. This medicine is used to treat iron deficiency anemia in people with chronic kidney disease. This medicine may be used for other purposes; ask your health care provider or pharmacist if you have questions. What should I tell my health care provider before I take this medicine? They need to know if you have any of these conditions: -anemia not caused by low iron levels -high levels of iron in the blood -magnetic resonance imaging (MRI) test scheduled -an unusual or allergic reaction to iron, other medicines, foods, dyes, or preservatives -pregnant or trying to get pregnant -breast-feeding How should I use this medicine? This medicine is for infusion into a vein. It is given by a health care professional in a hospital or clinic setting. Talk to your pediatrician regarding the use of this medicine in children. Special care may be needed. Overdosage: If you think you've taken too much of this medicine contact a poison control center or emergency room at once. Overdosage: If you think you have taken too much of this medicine contact a poison control center or emergency room at once. NOTE: This medicine is only for you. Do not share this medicine with others. What if I miss a dose? It is important not to miss your dose. Call your doctor or health care professional if you are unable to keep an appointment. What may interact with this medicine? This medicine may interact with the following medications: -other iron products This list may not describe all possible interactions. Give your health care provider a list of all the medicines, herbs, non-prescription drugs, or dietary supplements you use. Also tell them if you smoke, drink alcohol, or use illegal drugs. Some items may interact with your medicine. What should I watch  for while using this medicine? Visit your doctor or healthcare professional regularly. Tell your doctor or healthcare professional if your symptoms do not start to get better or if they get worse. You may need blood work done while you are taking this medicine. You may need to follow a special diet. Talk to your doctor. Foods that contain iron include: whole grains/cereals, dried fruits, beans, or peas, leafy green vegetables, and organ meats (liver, kidney). What side effects may I notice from receiving this medicine? Side effects that you should report to your doctor or health care professional as soon as possible: -allergic reactions like skin rash, itching or hives, swelling of the face, lips, or tongue -breathing problems -changes in blood pressure -feeling faint or lightheaded, falls -fever or chills -flushing, sweating, or hot feelings -swelling of the ankles or feet Side effects that usually do not require medical attention (Report these to your doctor or health care professional if they continue or are bothersome.): -diarrhea -headache -nausea, vomiting -stomach pain This list may not describe all possible side effects. Call your doctor for medical advice about side effects. You may report side effects to FDA at 1-800-FDA-1088. Where should I keep my medicine? This drug is given in a hospital or clinic and will not be stored at home. NOTE: This sheet is a summary. It may not cover all possible information. If you have questions about this medicine, talk to your doctor, pharmacist, or health care provider.  2013, [employer]. (04/07/2008 9:48:25 PM)  

## 2013-02-26 NOTE — Telephone Encounter (Signed)
Pt will pick up schedule for 8-6 [place] and to be NPO after midnight when she comes in today.

## 2013-02-27 ENCOUNTER — Other Ambulatory Visit: Payer: Self-pay

## 2013-02-27 ENCOUNTER — Emergency Department (HOSPITAL_COMMUNITY): Payer: Medicaid Other

## 2013-02-27 ENCOUNTER — Encounter (HOSPITAL_COMMUNITY): Payer: Self-pay | Admitting: Emergency Medicine

## 2013-02-27 ENCOUNTER — Observation Stay (HOSPITAL_COMMUNITY)
Admission: EM | Admit: 2013-02-27 | Discharge: 2013-03-01 | Disposition: A | Discharge status: Home / Self Care | Payer: Medicaid Other | Attending: Internal Medicine | Admitting: Internal Medicine

## 2013-02-27 DIAGNOSIS — L0390 Cellulitis, unspecified: Secondary | ICD-10-CM | POA: Diagnosis present

## 2013-02-27 DIAGNOSIS — G4730 Sleep apnea, unspecified: Secondary | ICD-10-CM

## 2013-02-27 DIAGNOSIS — R269 Unspecified abnormalities of gait and mobility: Secondary | ICD-10-CM

## 2013-02-27 DIAGNOSIS — R079 Chest pain, unspecified: Principal | ICD-10-CM | POA: Insufficient documentation

## 2013-02-27 DIAGNOSIS — F411 Generalized anxiety disorder: Secondary | ICD-10-CM | POA: Insufficient documentation

## 2013-02-27 DIAGNOSIS — R5383 Other fatigue: Secondary | ICD-10-CM

## 2013-02-27 DIAGNOSIS — J45909 Unspecified asthma, uncomplicated: Secondary | ICD-10-CM | POA: Insufficient documentation

## 2013-02-27 DIAGNOSIS — R059 Cough, unspecified: Secondary | ICD-10-CM | POA: Insufficient documentation

## 2013-02-27 DIAGNOSIS — K900 Celiac disease: Secondary | ICD-10-CM

## 2013-02-27 DIAGNOSIS — R000 Tachycardia, unspecified: Secondary | ICD-10-CM

## 2013-02-27 DIAGNOSIS — G4733 Obstructive sleep apnea (adult) (pediatric): Secondary | ICD-10-CM | POA: Insufficient documentation

## 2013-02-27 DIAGNOSIS — IMO0001 Reserved for inherently not codable concepts without codable children: Secondary | ICD-10-CM

## 2013-02-27 DIAGNOSIS — F319 Bipolar disorder, unspecified: Secondary | ICD-10-CM | POA: Insufficient documentation

## 2013-02-27 DIAGNOSIS — F1020 Alcohol dependence, uncomplicated: Secondary | ICD-10-CM

## 2013-02-27 DIAGNOSIS — E041 Nontoxic single thyroid nodule: Secondary | ICD-10-CM

## 2013-02-27 DIAGNOSIS — J019 Acute sinusitis, unspecified: Secondary | ICD-10-CM

## 2013-02-27 DIAGNOSIS — R0602 Shortness of breath: Secondary | ICD-10-CM | POA: Insufficient documentation

## 2013-02-27 DIAGNOSIS — N938 Other specified abnormal uterine and vaginal bleeding: Secondary | ICD-10-CM

## 2013-02-27 DIAGNOSIS — R21 Rash and other nonspecific skin eruption: Secondary | ICD-10-CM

## 2013-02-27 DIAGNOSIS — K120 Recurrent oral aphthae: Secondary | ICD-10-CM | POA: Insufficient documentation

## 2013-02-27 DIAGNOSIS — R05 Cough: Secondary | ICD-10-CM

## 2013-02-27 DIAGNOSIS — J309 Allergic rhinitis, unspecified: Secondary | ICD-10-CM

## 2013-02-27 DIAGNOSIS — D509 Iron deficiency anemia, unspecified: Secondary | ICD-10-CM

## 2013-02-27 DIAGNOSIS — D649 Anemia, unspecified: Secondary | ICD-10-CM | POA: Insufficient documentation

## 2013-02-27 DIAGNOSIS — J209 Acute bronchitis, unspecified: Secondary | ICD-10-CM

## 2013-02-27 DIAGNOSIS — D510 Vitamin B12 deficiency anemia due to intrinsic factor deficiency: Secondary | ICD-10-CM

## 2013-02-27 DIAGNOSIS — R1310 Dysphagia, unspecified: Secondary | ICD-10-CM

## 2013-02-27 DIAGNOSIS — J383 Other diseases of vocal cords: Secondary | ICD-10-CM

## 2013-02-27 DIAGNOSIS — Z79899 Other long term (current) drug therapy: Secondary | ICD-10-CM | POA: Insufficient documentation

## 2013-02-27 DIAGNOSIS — R609 Edema, unspecified: Secondary | ICD-10-CM | POA: Insufficient documentation

## 2013-02-27 DIAGNOSIS — I498 Other specified cardiac arrhythmias: Secondary | ICD-10-CM | POA: Insufficient documentation

## 2013-02-27 DIAGNOSIS — R197 Diarrhea, unspecified: Secondary | ICD-10-CM

## 2013-02-27 LAB — URINALYSIS, ROUTINE W REFLEX MICROSCOPIC
Bilirubin Urine: NEGATIVE
Glucose, UA: NEGATIVE mg/dL
Ketones, ur: NEGATIVE mg/dL
Specific Gravity, Urine: 1.013 (ref 1.005–1.030)
pH: 6 (ref 5.0–8.0)

## 2013-02-27 LAB — POCT I-STAT TROPONIN I: Troponin i, poc: 0.02 ng/mL (ref 0.00–0.08)

## 2013-02-27 LAB — CBC
Hemoglobin: 8.6 g/dL — ABNORMAL LOW (ref 12.0–15.0)
MCHC: 28.8 g/dL — ABNORMAL LOW (ref 30.0–36.0)
Platelets: 350 10*3/uL (ref 150–400)
RDW: 16.7 % — ABNORMAL HIGH (ref 11.5–15.5)

## 2013-02-27 LAB — URINE MICROSCOPIC-ADD ON

## 2013-02-27 LAB — COMPREHENSIVE METABOLIC PANEL
ALT: 41 U/L — ABNORMAL HIGH (ref 0–35)
AST: 36 U/L (ref 0–37)
Albumin: 3 g/dL — ABNORMAL LOW (ref 3.5–5.2)
Alkaline Phosphatase: 144 U/L — ABNORMAL HIGH (ref 39–117)
Calcium: 8.9 mg/dL (ref 8.4–10.5)
Potassium: 4.2 mEq/L (ref 3.5–5.1)
Sodium: 139 mEq/L (ref 135–145)
Total Protein: 6.7 g/dL (ref 6.0–8.3)

## 2013-02-27 LAB — PRO B NATRIURETIC PEPTIDE: Pro B Natriuretic peptide (BNP): 9 pg/mL (ref 0–125)

## 2013-02-27 MED ORDER — ONDANSETRON HCL 4 MG/2ML IJ SOLN: 4.0000 mg | Freq: Three times a day (TID) | INTRAMUSCULAR | Status: DC | PRN

## 2013-02-27 MED ORDER — SODIUM CHLORIDE 0.9 % IV SOLN
Freq: Once | INTRAVENOUS | Status: AC
  Administered 2013-02-27: 18:00:00 via INTRAVENOUS

## 2013-02-27 MED ORDER — ALBUTEROL SULFATE (5 MG/ML) 0.5% IN NEBU
5.0000 mg | INHALATION_SOLUTION | Freq: Once | RESPIRATORY_TRACT | Status: AC
  Administered 2013-02-27: 5 mg via RESPIRATORY_TRACT
  Filled 2013-02-27: qty 1

## 2013-02-27 MED ORDER — ASPIRIN 81 MG PO CHEW
324.0000 mg | CHEWABLE_TABLET | Freq: Once | ORAL | Status: AC
  Administered 2013-02-27: 324 mg via ORAL
  Filled 2013-02-27: qty 4

## 2013-02-27 MED ORDER — LORAZEPAM 1 MG PO TABS
1.0000 mg | ORAL_TABLET | Freq: Once | ORAL | Status: AC
  Administered 2013-02-27: 1 mg via ORAL
  Filled 2013-02-27: qty 1

## 2013-02-27 MED ORDER — IPRATROPIUM BROMIDE 0.02 % IN SOLN
0.5000 mg | Freq: Once | RESPIRATORY_TRACT | Status: AC
  Administered 2013-02-27: 0.5 mg via RESPIRATORY_TRACT
  Filled 2013-02-27: qty 2.5

## 2013-02-27 NOTE — ED Provider Notes (Signed)
CSN: [id number]     Arrival date & time 02/27/13  1642 History     First MD Initiated Contact with Patient 02/27/13 1650     Chief Complaint  Patient presents with  . Fatigue   (Consider location/radiation/quality/duration/timing/severity/associated sxs/prior Treatment) The history is provided by the patient and medical records. No language interpreter was used.    KALISI BEVILL Husch is a 47 y.o. female  with a hx of morbid obesity, bipolar disorder, seasonal allergies, asthma and anemia presents to the Emergency Department complaining of gradual, persistent, progressively worsening chest pain with associated SOB, productive cough, leg swelling beginning 2 days ago. Nothing makes it better and exertion makes it worse.  Patient also endorses abdominal pain when asked to localize she points to her bilateral ribs. Pt denies fever, chills, headache, neck pain, nausea, vomiting, diarrhea, dizziness, syncope, dysuria, hematuria.  She states that her primary care doctor has changed all her medications in the last week she cannot room or worse ones.   Patient seen on 02/12/2013 for prednisone reaction and intertrigo candida.  Note also mentions upper respiratory congestion.  Past Medical History  Diagnosis Date  . Asthma   . Allergic rhinitis, cause unspecified   . Bipolar 1 disorder   . Morbid obesity   . Thyroid nodule   . Mental disorder   . 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 Pt has never been tested.   . Anxiety   . OSA (obstructive sleep apnea)     uses cpap does not know settings  . Complication of anesthesia 2004    bp dropped after c section, took 24hrs to feels leg , numbing medicine does not last long when injected into skin  . Difficult intravenous access     hard stick for ivs and lab per pt  . Nasal bone fracture age 42    left side cannot place tube in nostril per pt  . Iron deficiency anemia, unspecified 02/24/2013  . Pernicious anemia 02/24/2013   Past  Surgical History  Procedure Laterality Date  . Tubal ligation    . Cesarean section    . Noraplant    . Wisdom tooth extraction    . Esophagogastroduodenoscopy N/A 01/02/2013    Procedure: ESOPHAGOGASTRODUODENOSCOPY (EGD);  Surgeon: Louis Meckel, MD;  Location: [facility];  Service: Endoscopy;  Laterality: N/A;  . Colonoscopy with propofol N/A 01/02/2013    Procedure: COLONOSCOPY WITH PROPOFOL;  Surgeon: Louis Meckel, MD;  Location: [facility];  Service: Endoscopy;  Laterality: N/A;   Family History  Problem Relation Age of Onset  . Breast cancer Mother   . Asthma Mother   . Emphysema Paternal Grandmother   . Diabetes Father    History  Substance Use Topics  . Smoking status: Former Smoker -- 0.50 packs/day for 10 years    Types: Cigarettes    Quit date: 07/30/2006  . Smokeless tobacco: Never Used  . Alcohol Use: No   OB History   Grav Para Term Preterm Abortions TAB SAB Ect Mult Living   10 6 6  4  4   6      Review of Systems  Constitutional: Positive for fatigue. Negative for fever, diaphoresis, appetite change and unexpected weight change.  HENT: Negative for mouth sores and neck stiffness.   Eyes: Negative for visual disturbance.  Respiratory: Positive for cough (productive) and shortness of breath. Negative for chest tightness and wheezing.   Cardiovascular: Positive for chest pain and leg swelling.  Gastrointestinal: Negative for nausea, vomiting, abdominal pain, diarrhea and constipation.  Endocrine: Negative for polydipsia, polyphagia and polyuria.  Genitourinary: Negative for dysuria, urgency, frequency and hematuria.  Musculoskeletal: Negative for back pain.  Skin: Negative for rash.  Allergic/Immunologic: Negative for immunocompromised state.  Neurological: Positive for weakness. Negative for syncope, light-headedness and headaches.  Hematological: Does not bruise/bleed easily.  Psychiatric/Behavioral: Negative for sleep disturbance. The patient is not  nervous/anxious.     Allergies  Bupropion hcl; Capzasin; Citalopram hydrobromide; Other; Stevia; Strawberry; Lamictal; and Latex  Home Medications   Current Outpatient Rx  Name  Route  Sig  Dispense  Refill  . albuterol (PROVENTIL HFA;VENTOLIN HFA) 108 (90 BASE) MCG/ACT inhaler   Inhalation   Inhale 2 puffs into the lungs 3 (three) times daily.          Marland Kitchen aspirin 81 MG tablet   Oral   Take 81 mg by mouth daily.         . beclomethasone (QVAR) 80 MCG/ACT inhaler   Inhalation   Inhale 2 puffs into the lungs 3 (three) times daily.         . clonazePAM (KLONOPIN) 0.5 MG tablet   Oral   Take 0.5 mg by mouth 2 (two) times daily as needed for anxiety.         . gabapentin (NEURONTIN) 300 MG capsule   Oral   Take 300-600 mg by mouth 3 (three) times daily. 300 mg in the morning and 600 mg at bedtime         . ibuprofen (ADVIL,MOTRIN) 200 MG tablet   Oral   Take 600 mg by mouth every 6 (six) hours as needed for pain.         Marland Kitchen loratadine (CLARITIN) 10 MG tablet   Oral   Take 10 mg by mouth every morning.          . methocarbamol (ROBAXIN) 500 MG tablet   Oral   Take 250-500 mg by mouth 3 (three) times daily.          Marland Kitchen nystatin cream (MYCOSTATIN)      Apply to affected area 2 times daily   30 g   0   . OVER THE COUNTER MEDICATION   Oral   Take 1 tablet by mouth daily. Now brand Respir-all respiratory support         . pantoprazole (PROTONIX) 40 MG tablet   Oral   Take 40 mg by mouth every morning.         . ziprasidone (GEODON) 20 MG capsule   Oral   Take 20 mg by mouth 2 (two) times daily with a meal. 1 tab in AM and 2 in PM         . EPINEPHrine (EPI-PEN) 0.3 mg/0.3 mL DEVI   Intramuscular   Inject 0.3 mg into the muscle as needed (for allergic reaction).          . hyoscyamine (LEVSIN, ANASPAZ) 0.125 MG tablet   Oral   Take 0.125 mg by mouth every 4 (four) hours as needed for cramping.         . ondansetron (ZOFRAN) 4 MG tablet    Oral   Take 4 mg by mouth 2 (two) times daily as needed for nausea (migraines).         . rizatriptan (MAXALT-MLT) 10 MG disintegrating tablet   Oral   Take 10 mg by mouth daily as needed for migraine. May repeat in 2 hours if  needed for migraines          BP 113/57  Pulse 102  SpO2 99%  LMP 02/20/2013 Physical Exam  Nursing note and vitals reviewed. Constitutional: She appears well-developed and well-nourished. No distress.  Awake, alert, nontoxic appearance  HENT:  Head: Normocephalic and atraumatic.  Mouth/Throat: Oropharynx is clear and moist. No oropharyngeal exudate.  Eyes: Conjunctivae and EOM are normal. Pupils are equal, round, and reactive to light. No scleral icterus.  Neck: Normal range of motion. Neck supple.  Cardiovascular: Regular rhythm, S1 normal, S2 normal, normal heart sounds and intact distal pulses.  Tachycardia present.   No murmur heard. Pulses:      Radial pulses are 2+ on the right side, and 2+ on the left side.       Dorsalis pedis pulses are 2+ on the right side, and 2+ on the left side.  2+ pitting edema in the lower extremities  Pulmonary/Chest: No accessory muscle usage. Not tachypneic. No respiratory distress. She has decreased breath sounds. She has no wheezes. She has rhonchi. She has no rales. She exhibits no tenderness and no bony tenderness.  Abdominal: Soft. Normal appearance and bowel sounds are normal. She exhibits no distension and no mass. There is no tenderness. There is no rigidity, no rebound and no guarding.  obese  Musculoskeletal: Normal range of motion. She exhibits no edema.  Lymphadenopathy:    She has no cervical adenopathy.  Neurological: She is alert.  Speech is clear and goal oriented Moves extremities without ataxia  Skin: Skin is warm and dry. She is not diaphoretic.  Mild erythema to the lower legs, no induration or abscess formation  Psychiatric: She has a normal mood and affect.    ED Course   Procedures  (including critical care time)  ECG:  Date: 02/27/2013  Rate: 112  Rhythm: sinus tachycardia  QRS Axis: normal  Intervals: normal  ST/T Wave abnormalities: nonspecific T wave changes  Conduction Disutrbances:none  Narrative Interpretation: t wave flattening in V3, T wave inversion III, compared to 11/24/12  Old EKG Reviewed: changes noted    Labs Reviewed  CBC - Abnormal; Notable for the following:    Hemoglobin 8.6 (*)    HCT 29.9 (*)    MCV 77.3 (*)    MCH 22.2 (*)    MCHC 28.8 (*)    RDW 16.7 (*)    All other components within normal limits  COMPREHENSIVE METABOLIC PANEL - Abnormal; Notable for the following:    Glucose, Bld 142 (*)    BUN 5 (*)    Albumin 3.0 (*)    ALT 41 (*)    Alkaline Phosphatase 144 (*)    All other components within normal limits  URINALYSIS, ROUTINE W REFLEX MICROSCOPIC - Abnormal; Notable for the following:    Hgb urine dipstick TRACE (*)    Leukocytes, UA SMALL (*)    All other components within normal limits  URINE MICROSCOPIC-ADD ON - Abnormal; Notable for the following:    Squamous Epithelial / LPF FEW (*)    All other components within normal limits  PRO B NATRIURETIC PEPTIDE  D-DIMER, QUANTITATIVE  POCT PREGNANCY, URINE  POCT I-STAT TROPONIN I  OCCULT BLOOD, POC DEVICE  PREPARE RBC (CROSSMATCH)   Dg Chest 2 View  02/27/2013   *RADIOLOGY REPORT*  Clinical Data: Shortness of breath and cough.  CHEST - 2 VIEW  Comparison: 11/24/2012.  Findings: The cardiac silhouette, mediastinal and hilar contours are within normal limits and stable.  The lungs are clear.  No pleural effusion or pneumothorax.  The bony thorax is intact. There is artifact overlying the right upper chest.  IMPRESSION: No acute cardiopulmonary findings.   Original Report Authenticated By: Rudie Meyer, M.D.   1. Anemia   2. Tachycardia   3. Dyspnea on exertion   4. Fatigue     MDM  Burke Keels Husch presents with CP, DOE, SOB and tachycardia.  Pt with Hx of  anemia requiring transfusion. Will proceed with cardiac work-up and give albuterol.  Pt with resolution of rhonchi after albuterol neb and pt states she no longer feels SOB, but CP persists.  Pt given 1L NS bolus with persistent tachycardia.    D-dimer negative, troponin negative, BNP negative, CBC with anemia at 8.6. Patient's last hemoglobin 10.7, 4 days ago. FOBC negative and pt endorses negative colonoscopy for initial anemia work-up.  CMP unremarkable, as it is negative, UA without evidence of urinary tract infection.  Chest x-ray without evidence of pulmonary edema or pneumonia.  Patient continues to remain tachycardic with 115-120.  Unlikely ACS at this time.  Symptomatic anemia, will admit to medicine for blood transfusion.    Dr. Karma Ganja was consulted, evaluated this patient with me and agrees with the plan.      Dahlia Client Cris Talavera, PA-C 02/27/13 2251  Dierdre Forth, PA-C 02/27/13 2252

## 2013-02-27 NOTE — ED Notes (Signed)
Pt received iron infusion yesterday morning and felt good after infusion, but progressively became weaker throughout the day. Pt having SOB with walking. Pt saw allergist 2 days ago and he changed a lot of her medications. Allergist told her to stop taking albuterol and benadryl and gave her a nasal spray instead.

## 2013-02-27 NOTE — H&P (Signed)
[facility] History and Physical  Stacey [name] Husch ZOX:[id number] DOB: 16-Nov-1965 DOA: 02/27/2013  Referring physician: ER physician. PCP: Kaleen Mask, MD   Chief Complaint: Chest pain and shortness of breath.  HPI: Stacey [name] is a 47 y.o. female with history of iron deficiency and B12 deficiency anemia presented to the ER because of chest pressure and shortness of breath. Patient has been having these symptoms for last 2 days. Initially symptoms were only exertional and it became more persistent on at rest also. In the ER chest x-ray and cardiac markers and d-dimer were unremarkable. EKG was showing sinus tachycardia. Patient does have anemia and hemoglobin is around her baseline and patient did receive IV and yesterday from her hematologist. At this time patient has been admitted because of chest discomfort and possible symptomatic anemia. Patient has had extensive workup for anemia through her hematologist and gastroenterologist and has had colonoscopy and EGD done in June which was unremarkable. Stool for occult blood today has been negative. Patient also has noticed a rash on both her lower extremity since yesterday for which patient was given nystatin powder. Patient otherwise denies any nausea vomiting abdominal pain fever chills or diarrhea. Has been having some cough.  Review of Systems: As presented in the history of presenting illness, rest negative.  Past Medical History  Diagnosis Date  . Asthma   . Allergic rhinitis, cause unspecified   . Bipolar 1 disorder   . Morbid obesity   . Thyroid nodule   . Mental disorder   . 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 Pt has never been tested.   . Anxiety   . OSA (obstructive sleep apnea)     uses cpap does not know settings  . Complication of anesthesia 2004    bp dropped after c section, took 24hrs to feels leg , numbing medicine does not last long when injected into skin  . Difficult intravenous  access     hard stick for ivs and lab per pt  . Nasal bone fracture age 42    left side cannot place tube in nostril per pt  . Iron deficiency anemia, unspecified 02/24/2013  . Pernicious anemia 02/24/2013   Past Surgical History  Procedure Laterality Date  . Tubal ligation    . Cesarean section    . Noraplant    . Wisdom tooth extraction    . Esophagogastroduodenoscopy N/A 01/02/2013    Procedure: ESOPHAGOGASTRODUODENOSCOPY (EGD);  Surgeon: Louis Meckel, MD;  Location: [facility];  Service: Endoscopy;  Laterality: N/A;  . Colonoscopy with propofol N/A 01/02/2013    Procedure: COLONOSCOPY WITH PROPOFOL;  Surgeon: Louis Meckel, MD;  Location: [facility];  Service: Endoscopy;  Laterality: N/A;   Social History:  reports that she quit smoking about 6 years ago. Her smoking use included Cigarettes. She has a 5 pack-year smoking history. She has never used smokeless tobacco. She reports that she does not drink alcohol or use illicit drugs. Home. where does patient live-- Can do ADLs. Can patient participate in ADLs?  Allergies  Allergen Reactions  . Bupropion Hcl Other (See Comments)    swelling in hands  . Capzasin (Capsaicin) Other (See Comments)    Red and burning  . Citalopram Hydrobromide Other (See Comments)    Doesn't remember reaction  . Other Other (See Comments)    Bleach cause vocal cord to close  . Stevia (Stevioside) Swelling    Face and throat swell   .  Strawberry Other (See Comments)    Caused facial swelling and rash  . Lamictal (Lamotrigine) Rash  . Latex Other (See Comments)    Blisters where the latex touched it    Family History  Problem Relation Age of Onset  . Breast cancer Mother   . Asthma Mother   . Emphysema Paternal Grandmother   . Diabetes Father       Prior to Admission medications   Medication Sig Start Date End Date Taking? Authorizing Provider  albuterol (PROVENTIL HFA;VENTOLIN HFA) 108 (90 BASE) MCG/ACT inhaler Inhale 2 puffs into the  lungs 3 (three) times daily.    Yes Historical Provider, MD  aspirin 81 MG tablet Take 81 mg by mouth daily.   Yes Historical Provider, MD  beclomethasone (QVAR) 80 MCG/ACT inhaler Inhale 2 puffs into the lungs 3 (three) times daily.   Yes Historical Provider, MD  clonazePAM (KLONOPIN) 0.5 MG tablet Take 0.5 mg by mouth 2 (two) times daily as needed for anxiety.   Yes Historical Provider, MD  gabapentin (NEURONTIN) 300 MG capsule Take 300-600 mg by mouth 3 (three) times daily. 300 mg in the morning and 600 mg at bedtime   Yes Historical Provider, MD  ibuprofen (ADVIL,MOTRIN) 200 MG tablet Take 600 mg by mouth every 6 (six) hours as needed for pain.   Yes Historical Provider, MD  loratadine (CLARITIN) 10 MG tablet Take 10 mg by mouth every morning.    Yes Historical Provider, MD  methocarbamol (ROBAXIN) 500 MG tablet Take 250-500 mg by mouth 3 (three) times daily.    Yes Historical Provider, MD  nystatin cream (MYCOSTATIN) Apply to affected area 2 times daily 02/12/13  Yes Peter S Dammen, PA-C  OVER THE COUNTER MEDICATION Take 1 tablet by mouth daily. Now brand Respir-all respiratory support   Yes Historical Provider, MD  pantoprazole (PROTONIX) 40 MG tablet Take 40 mg by mouth every morning. 11/24/12  Yes Gavin Pound. Ghim, MD  ziprasidone (GEODON) 20 MG capsule Take 20 mg by mouth 2 (two) times daily with a meal. 1 tab in AM and 2 in PM   Yes Historical Provider, MD  EPINEPHrine (EPI-PEN) 0.3 mg/0.3 mL DEVI Inject 0.3 mg into the muscle as needed (for allergic reaction).     Historical Provider, MD  hyoscyamine (LEVSIN, ANASPAZ) 0.125 MG tablet Take 0.125 mg by mouth every 4 (four) hours as needed for cramping. 12/08/12   Louis Meckel, MD  ondansetron (ZOFRAN) 4 MG tablet Take 4 mg by mouth 2 (two) times daily as needed for nausea (migraines).    Historical Provider, MD  rizatriptan (MAXALT-MLT) 10 MG disintegrating tablet Take 10 mg by mouth daily as needed for migraine. May repeat in 2 hours if  needed for migraines 12/09/12   [name], MD   Physical Exam: Filed Vitals:   02/27/13 1653 02/27/13 1856  BP: 148/81 113/57  Pulse: 113 102  TempSrc: Oral   SpO2: 98% 99%     General:  Well-developed well-nourished.  Eyes: Anicteric no pallor.  ENT: No discharge from the ears eyes nose mouth.  Neck: No mass felt.  Cardiovascular: S1-S2 heard tachycardic.  Respiratory: No rhonchi or crepitations.  Abdomen: Soft nontender bowel sounds present.  Skin: Bilateral lower extremity rash.  Musculoskeletal: Mild edema lower extremities.  Psychiatric: Appears normal.  Neurologic: Alert awake oriented to time place and person. Moves all extremities.  Labs on Admission:  Basic Metabolic Panel:  Recent Labs Lab 02/27/13 1755  NA 139  K  4.2  CL 104  CO2 28  GLUCOSE 142*  BUN 5*  CREATININE 0.51  CALCIUM 8.9   Liver Function Tests:  Recent Labs Lab 02/27/13 1755  AST 36  ALT 41*  ALKPHOS 144*  BILITOT 0.3  PROT 6.7  ALBUMIN 3.0*   No results found for this basename: LIPASE, AMYLASE,  in the last 168 hours No results found for this basename: AMMONIA,  in the last 168 hours CBC:  Recent Labs Lab 02/23/13 1518 02/27/13 1755  WBC 10.7* 9.1  NEUTROABS 8.8*  --   HGB 10.1* 8.6*  HCT 33.8* 29.9*  MCV 79* 77.3*  PLT 374 350   Cardiac Enzymes: No results found for this basename: CKTOTAL, CKMB, CKMBINDEX, TROPONINI,  in the last 168 hours  BNP (last 3 results)  Recent Labs  02/27/13 1755  PROBNP 9.0   CBG: No results found for this basename: GLUCAP,  in the last 168 hours  Radiological Exams on Admission: Dg Chest 2 View  02/27/2013   *RADIOLOGY REPORT*  Clinical Data: Shortness of breath and cough.  CHEST - 2 VIEW  Comparison: 11/24/2012.  Findings: The cardiac silhouette, mediastinal and hilar contours are within normal limits and stable.  The lungs are clear.  No pleural effusion or pneumothorax.  The bony thorax is intact. There is artifact  overlying the right upper chest.  IMPRESSION: No acute cardiopulmonary findings.   Original Report Authenticated By: Rudie Meyer, M.D.    EKG: Independently reviewed. Sinus tachycardia.  Assessment/Plan Principal Problem:   Chest pain Active Problems:   BIPOLAR DISORDER UNSPECIFIED   SHORTNESS OF BREATH (SOB)   1. Chest pain with shortness of breath - since patient is anemic at this time patient has been ordered one unit of packed red blood cell transfusion concerning for symptomatic anemia. I have also ordered 2-D echo and Dopplers of lower extremities. Cycle cardiac markers. Since patient is tachycardic we will also check thyroid function test and urine drug screen. 2. Ion deficiency and B12 deficiency anemia - patient is receiving B12 shots every month and has received IV and yesterday. Has been followed by her hematologist. 3. Lower extremity erythema - for now I place patient on doxycycline. Closely observe to see if there is any worsening. 4. Bipolar disorder - continue present medications. 5. Vocal cord dysfunction.    Code Status: Full code.  Family Communication: Patient's husband at the bedside.  Disposition Plan: Admit for observation.    Faythe Heitzenrater N. [facility] Pager [phone].  If 7PM-7AM, please contact night-coverage [email or web address] Password [facility] 02/27/2013, 11:33 PM

## 2013-02-27 NOTE — ED Notes (Signed)
BED ASSIGNED@2240 

## 2013-02-27 NOTE — ED Provider Notes (Signed)
Medical screening examination/treatment/procedure(s) were performed by non-physician practitioner and as supervising physician I was immediately available for consultation/collaboration.  Ethelda Chick, MD 02/27/13 [phone]

## 2013-02-28 ENCOUNTER — Other Ambulatory Visit: Payer: Self-pay

## 2013-02-28 DIAGNOSIS — R609 Edema, unspecified: Secondary | ICD-10-CM

## 2013-02-28 DIAGNOSIS — I519 Heart disease, unspecified: Secondary | ICD-10-CM

## 2013-02-28 DIAGNOSIS — J309 Allergic rhinitis, unspecified: Secondary | ICD-10-CM

## 2013-02-28 DIAGNOSIS — K120 Recurrent oral aphthae: Secondary | ICD-10-CM

## 2013-02-28 DIAGNOSIS — F319 Bipolar disorder, unspecified: Secondary | ICD-10-CM

## 2013-02-28 DIAGNOSIS — L0291 Cutaneous abscess, unspecified: Secondary | ICD-10-CM

## 2013-02-28 LAB — ABO/RH: ABO/RH(D): O POS

## 2013-02-28 LAB — BASIC METABOLIC PANEL
BUN: 5 mg/dL — ABNORMAL LOW (ref 6–23)
Chloride: 105 mEq/L (ref 96–112)
GFR calc Af Amer: >90 mL/min (ref >90)
GFR calc non Af Amer: >90 mL/min (ref >90)
Potassium: 3.7 mEq/L (ref 3.5–5.1)
Sodium: 139 mEq/L (ref 135–145)

## 2013-02-28 LAB — RAPID URINE DRUG SCREEN, HOSP PERFORMED
Amphetamines: NOT DETECTED
Benzodiazepines: NOT DETECTED
Opiates: NOT DETECTED
Tetrahydrocannabinol: NOT DETECTED

## 2013-02-28 LAB — TROPONIN I
Troponin I: <0.3 ng/mL (ref <0.30)
Troponin I: <0.3 ng/mL (ref <0.30)

## 2013-02-28 LAB — CBC
HCT: 29.9 % — ABNORMAL LOW (ref 36.0–46.0)
Hemoglobin: 8.8 g/dL — ABNORMAL LOW (ref 12.0–15.0)
MCHC: 29.4 g/dL — ABNORMAL LOW (ref 30.0–36.0)
RBC: 3.84 MIL/uL — ABNORMAL LOW (ref 3.87–5.11)

## 2013-02-28 LAB — T3, FREE: T3, Free: 2.8 pg/mL (ref 2.3–4.2)

## 2013-02-28 MED ORDER — CLONAZEPAM 0.5 MG PO TABS
0.5000 mg | ORAL_TABLET | Freq: Two times a day (BID) | ORAL | Status: DC | PRN
  Administered 2013-02-28 (×2): 0.5 mg via ORAL
  Filled 2013-02-28 (×2): qty 1

## 2013-02-28 MED ORDER — ONDANSETRON HCL 4 MG/2ML IJ SOLN: 4.0000 mg | Freq: Four times a day (QID) | INTRAMUSCULAR | Status: DC | PRN

## 2013-02-28 MED ORDER — LIDOCAINE VISCOUS 2 % MT SOLN
15.0000 mL | OROMUCOSAL | Status: DC | PRN
  Administered 2013-02-28: 15 mL via OROMUCOSAL
  Filled 2013-02-28 (×2): qty 15

## 2013-02-28 MED ORDER — ACETAMINOPHEN 650 MG RE SUPP: 650.0000 mg | Freq: Four times a day (QID) | RECTAL | Status: DC | PRN

## 2013-02-28 MED ORDER — SODIUM CHLORIDE 0.9 % IJ SOLN
3.0000 mL | Freq: Two times a day (BID) | INTRAMUSCULAR | Status: DC
  Administered 2013-02-28 (×3): 3 mL via INTRAVENOUS

## 2013-02-28 MED ORDER — NYSTATIN 100000 UNIT/GM EX CREA
TOPICAL_CREAM | Freq: Three times a day (TID) | CUTANEOUS | Status: DC
  Administered 2013-02-28 – 2013-03-01 (×5): via TOPICAL
  Filled 2013-02-28: qty 15

## 2013-02-28 MED ORDER — ACETAMINOPHEN 325 MG PO TABS
650.0000 mg | ORAL_TABLET | Freq: Four times a day (QID) | ORAL | Status: DC | PRN
  Administered 2013-02-28 (×2): 650 mg via ORAL
  Filled 2013-02-28 (×2): qty 2

## 2013-02-28 MED ORDER — LORATADINE 10 MG PO TABS
10.0000 mg | ORAL_TABLET | Freq: Every morning | ORAL | Status: DC
  Administered 2013-02-28 – 2013-03-01 (×2): 10 mg via ORAL
  Filled 2013-02-28 (×2): qty 1

## 2013-02-28 MED ORDER — ONDANSETRON HCL 4 MG PO TABS: 4.0000 mg | ORAL_TABLET | Freq: Two times a day (BID) | ORAL | Status: DC | PRN

## 2013-02-28 MED ORDER — SODIUM CHLORIDE 0.9 % IJ SOLN
3.0000 mL | Freq: Two times a day (BID) | INTRAMUSCULAR | Status: DC
  Administered 2013-02-28: 3 mL via INTRAVENOUS

## 2013-02-28 MED ORDER — DEXTROSE 5 % IV SOLN
100.0000 mg | Freq: Two times a day (BID) | INTRAVENOUS | Status: DC
  Administered 2013-02-28 (×2): 100 mg via INTRAVENOUS
  Filled 2013-02-28 (×3): qty 100

## 2013-02-28 MED ORDER — ALBUTEROL SULFATE (5 MG/ML) 0.5% IN NEBU
2.5000 mg | INHALATION_SOLUTION | Freq: Three times a day (TID) | RESPIRATORY_TRACT | Status: DC
  Administered 2013-02-28 – 2013-03-01 (×4): 2.5 mg via RESPIRATORY_TRACT
  Filled 2013-02-28 (×4): qty 0.5

## 2013-02-28 MED ORDER — PANTOPRAZOLE SODIUM 40 MG PO TBEC
40.0000 mg | DELAYED_RELEASE_TABLET | Freq: Every morning | ORAL | Status: DC
  Administered 2013-02-28 – 2013-03-01 (×2): 40 mg via ORAL
  Filled 2013-02-28 (×2): qty 1

## 2013-02-28 MED ORDER — ONDANSETRON HCL 4 MG PO TABS: 4.0000 mg | ORAL_TABLET | Freq: Four times a day (QID) | ORAL | Status: DC | PRN

## 2013-02-28 MED ORDER — GABAPENTIN 300 MG PO CAPS: 300.0000 mg | ORAL_CAPSULE | Freq: Three times a day (TID) | ORAL | Status: DC

## 2013-02-28 MED ORDER — ZIPRASIDONE HCL 20 MG PO CAPS
20.0000 mg | ORAL_CAPSULE | Freq: Every day | ORAL | Status: DC
  Administered 2013-02-28 – 2013-03-01 (×2): 20 mg via ORAL
  Filled 2013-02-28 (×3): qty 1

## 2013-02-28 MED ORDER — METHOCARBAMOL 500 MG PO TABS
250.0000 mg | ORAL_TABLET | Freq: Three times a day (TID) | ORAL | Status: DC
  Administered 2013-02-28 – 2013-03-01 (×4): 500 mg via ORAL
  Filled 2013-02-28 (×7): qty 1

## 2013-02-28 MED ORDER — DOXYCYCLINE HYCLATE 100 MG IV SOLR
100.0000 mg | Freq: Two times a day (BID) | INTRAVENOUS | Status: DC
  Administered 2013-03-01: 100 mg via INTRAVENOUS
  Filled 2013-02-28 (×2): qty 100

## 2013-02-28 MED ORDER — ZIPRASIDONE HCL 20 MG PO CAPS: 20.0000 mg | ORAL_CAPSULE | Freq: Two times a day (BID) | ORAL | Status: DC

## 2013-02-28 MED ORDER — ZIPRASIDONE HCL 40 MG PO CAPS
40.0000 mg | ORAL_CAPSULE | Freq: Every day | ORAL | Status: DC
  Administered 2013-02-28 (×2): 40 mg via ORAL
  Filled 2013-02-28 (×3): qty 1

## 2013-02-28 MED ORDER — GABAPENTIN 300 MG PO CAPS
300.0000 mg | ORAL_CAPSULE | ORAL | Status: DC
  Administered 2013-02-28 – 2013-03-01 (×3): 300 mg via ORAL
  Filled 2013-02-28 (×6): qty 1

## 2013-02-28 MED ORDER — GABAPENTIN 300 MG PO CAPS
600.0000 mg | ORAL_CAPSULE | Freq: Every day | ORAL | Status: DC
  Administered 2013-02-28 (×2): 600 mg via ORAL
  Filled 2013-02-28 (×3): qty 2

## 2013-02-28 MED ORDER — ASPIRIN 81 MG PO CHEW
81.0000 mg | CHEWABLE_TABLET | Freq: Every day | ORAL | Status: DC
  Administered 2013-02-28 – 2013-03-01 (×2): 81 mg via ORAL
  Filled 2013-02-28 (×2): qty 1

## 2013-02-28 MED ORDER — ALBUTEROL SULFATE HFA 108 (90 BASE) MCG/ACT IN AERS: 2.0000 | INHALATION_SPRAY | Freq: Three times a day (TID) | RESPIRATORY_TRACT | Status: DC

## 2013-02-28 NOTE — Progress Notes (Signed)
*  PRELIMINARY RESULTS* Vascular Ultrasound Lower extremity venous duplex has been completed.  Preliminary findings: no obvious evidence of DVT. Not all veins were clearly visualized due to body habitus.   Farrel Demark, RDMS, RVT  02/28/2013, 8:53 AM

## 2013-02-28 NOTE — Progress Notes (Signed)
Comment: Patient complained of mouth pain. Exam revealed apthous ulcers on dorsum of tongue. Have commenced prn 2% Viscous Lidocaine.   C. Jordani Nunn. MD, FACP.

## 2013-02-28 NOTE — Care Management Note (Addendum)
    Page 1 of 1   03/01/2013     3:39:20 PM   CARE MANAGEMENT NOTE 03/01/2013  Patient:  Stacey [name], Stacey [name]   Account Number:  [id number]  Date Initiated:  02/28/2013  Documentation initiated by:  Lanier Clam  Subjective/Objective Assessment:   47 Y/O F ADMITTED W/CHEST PAIN.ZO:XWRUEAV ANEMIA,ASTHMA,SLEEP APNEA,OBESITY,BIPOLAR.     Action/Plan:   FROM HOME W/SPOUSE.HAS PCP,PHARMACY.   Anticipated DC Date:  03/01/2013   Anticipated DC Plan:  HOME/SELF CARE      DC Planning Services  CM consult      Choice offered to / List presented to:             Status of service:  Completed, signed off Medicare Important Message given?   (If response is "NO", the following Medicare IM given date fields will be blank) Date Medicare IM given:   Date Additional Medicare IM given:    Discharge Disposition:  HOME/SELF CARE  Per UR Regulation:  Reviewed for med. necessity/level of care/duration of stay  If discussed at Long Length of Stay Meetings, dates discussed:    Comments:

## 2013-02-28 NOTE — Progress Notes (Signed)
[facility] PROGRESS NOTE  PRIM MORACE Husch BMW:[id number] DOB: 02/12/1966 DOA: 02/27/2013 PCP: Kaleen Mask, MD  Assessment/Plan: Principal Problem:   Chest pain Active Problems:   BIPOLAR DISORDER UNSPECIFIED   SHORTNESS OF BREATH (SOB)    1. Chest pain/Dyspnea: Patient presented with progressive SOB/chest discomfort. CXR was negative for acute findings, D-Dimer was 0.41, ProBNP only 9.0. 12-Lead EKG showed no acute ischemic changes, and cardiac enzymes remained unelevated. Symptoms appear multi-factorial, secondary to bronchial asthma, OSA/OHS, deconditioning and moderate anemia. 2-D echocardiogram is pending. Patient was asymptomatic overnight and O2 saturation is 98% on RA. 2. Anemia: Patient has known chronic anemia, secondary to iron/B12 deficiency, under care of Dr Arlan Organ. She has had extensive workup with colonoscopy/EGD done in 12/2012, which was unremarkable. FOBT was negative on 02/27/13. She is status post iv iron infusion on 02/26/13. HB at presentation, was 8.6, against a baseline HB of 10.1 on 02/26/13. She has no clinical eveidence of acute bleed. Given symptomatic anemia, transfusing PRBC. Following CBC.  3. Lower extremity erythema: Patient has redness/sorerness both lower legs, consistent with cellulitis. Managing with Doxycycline, now day#2. Wcc is normal, and patient is apyrexial. 4. Bipolar disorder/Anxiety: Stable. On re-admission medications. OSA: Continue nocturnal CPAP.  5. Bronchial Asthma: No clinical exacerbation. Prn Bronchodilators.   Code Status: Full Code.  Family Communication: Disposition Plan: To be determined.    Brief narrative: 47 y.o. female with history of Allergic rhinitis, bronchial asthma, morbid obesity, OSA on CPAP, anxiety, thyroid nodule, bipolar disorder, beta thalassemia minor, iron deficiency and B12 deficiency anemia,  presenting to the ER because of chest pressure and shortness of breath. Patient has been having  these symptoms for last 2 days. Initially symptoms were only exertional, then became more persistent and at rest also. In the ER chest x-ray and cardiac markers and D-Dimer were unremarkable. EKG showed sinus tachycardia. Patient does have anemia, hemoglobin is around her baseline and she did receive IV iron on 02/26/13 from her hematologist, Dr Arlan Organ. Patient also has noticed a rash on both her lower extremity since 02/26/13, for which she was given Nystatin powder. Patient otherwise denies any nausea vomiting abdominal pain fever chills or diarrhea. Has been having some cough. Admitted for further evaluation and management.    Consultants:  N/A.   Procedures:  CXR.   Antibiotics:  Doxycycline 02/27/13>>>  HPI/Subjective: No new issues.   Objective: Vital signs in last 24 hours: Temp:  [97.9 F (36.6 C)-98.8 F (37.1 C)] 98.2 F (36.8 C) (08/02 0655) Pulse Rate:  [97-115] 97 (08/02 0655) Resp:  [20-22] 20 (08/02 0655) BP: (112-148)/(52-81) 129/62 mmHg (08/02 0655) SpO2:  [98 %-99 %] 98 % (08/02 0035) Weight:  [167.1 kg (368 lb 6.2 oz)] 167.1 kg (368 lb 6.2 oz) (08/02 0035) Weight change:  Last BM Date: 02/27/13  Intake/Output from previous day: 08/01 0701 - 08/02 0700 In: 793 [I.V.:500; Blood:293] Out: -  Total I/O In: 350 [Blood:350] Out: -    Physical Exam: General: Comfortable, alert, communicative, fully oriented, not short of breath at rest.  HEENT: Moderate clinical pallor, no jaundice, no conjunctival injection or discharge. NECK:  Supple, JVP not seen, no carotid bruits, no palpable lymphadenopathy, no palpable goiter. CHEST:  Clinically clear to auscultation, no wheezes, no crackles. HEART:  Sounds 1 and 2 heard, normal, regular, no murmurs. ABDOMEN:  Morbidly obese, soft, non-tender, no palpable organomegaly, no palpable masses, normal bowel sounds. GENITALIA:  Not examined. LOWER EXTREMITIES:  Minimal pitting edema bilateral LE  erythema, palpable  peripheral pulses. MUSCULOSKELETAL SYSTEM:  Unremarkable. CENTRAL NERVOUS SYSTEM:  No focal neurologic deficit on gross examination.  Lab Results:  Recent Labs  02/27/13 1755 02/28/13 0105  WBC 9.1 10.1  HGB 8.6* 8.8*  HCT 29.9* 29.9*  PLT 350 395    Recent Labs  02/27/13 1755 02/28/13 0105  NA 139 139  K 4.2 3.7  CL 104 105  CO2 28 25  GLUCOSE 142* 146*  BUN 5* 5*  CREATININE 0.51 0.55  CALCIUM 8.9 9.0   No results found for this or any previous visit (from the past 240 hour(s)).   Studies/Results: Dg Chest 2 View  02/27/2013   *RADIOLOGY REPORT*  Clinical Data: Shortness of breath and cough.  CHEST - 2 VIEW  Comparison: 11/24/2012.  Findings: The cardiac silhouette, mediastinal and hilar contours are within normal limits and stable.  The lungs are clear.  No pleural effusion or pneumothorax.  The bony thorax is intact. There is artifact overlying the right upper chest.  IMPRESSION: No acute cardiopulmonary findings.   Original Report Authenticated By: Rudie Meyer, M.D.    Medications: Scheduled Meds: . albuterol  2.5 mg Nebulization TID  . aspirin  81 mg Oral Daily  . doxycycline (VIBRAMYCIN) IV  100 mg Intravenous Q12H  . gabapentin  300 mg Oral Custom  . gabapentin  600 mg Oral QHS  . loratadine  10 mg Oral q morning - 10a  . methocarbamol  250-500 mg Oral TID  . nystatin cream   Topical TID  . pantoprazole  40 mg Oral q morning - 10a  . sodium chloride  3 mL Intravenous Q12H  . sodium chloride  3 mL Intravenous Q12H  . ziprasidone  20 mg Oral Q breakfast  . ziprasidone  40 mg Oral Q supper   Continuous Infusions:  PRN Meds:.acetaminophen, acetaminophen, clonazePAM, ondansetron (ZOFRAN) IV, ondansetron, ondansetron    LOS: 1 day   Carlotta Telfair,CHRISTOPHER  [facility] Pager [phone]. If 8PM-8AM, please contact night-coverage at [email or web address], password [facility] 02/28/2013, 7:23 AM  LOS: 1 day

## 2013-02-28 NOTE — Progress Notes (Signed)
Patient complains of sinus congestion and post nasal drip. She requests scheduled nebulizers as opposed to metered dose inhalers. She also states she uses a CPAP at home with a full face mask, but does not know her home settings. RT treatment protocol  Completed. Orders changed accordingly. Placed on nocturnal BiPAP with auto titration settings with full face mask. Tolerated well. She demonstrates the ability to place and remove the mask herself and agrees the pressures feels good. Auto titration settings Imax 20 Emin 5.

## 2013-02-28 NOTE — Progress Notes (Signed)
Auto bipap already setup & I placed her circuit & mask to it. Humidification chamber is filled with sterile water. RN was in there with me at the time & she said she would help her place it on when she was ready. All she has to do is hit the ON button & I showed her where it was.  Jacqulynn Cadet RRT

## 2013-03-01 DIAGNOSIS — L0390 Cellulitis, unspecified: Secondary | ICD-10-CM | POA: Diagnosis present

## 2013-03-01 DIAGNOSIS — K120 Recurrent oral aphthae: Secondary | ICD-10-CM | POA: Diagnosis present

## 2013-03-01 LAB — TYPE AND SCREEN: Unit division: 0

## 2013-03-01 LAB — BASIC METABOLIC PANEL
CO2: 25 mEq/L (ref 19–32)
Calcium: 9.3 mg/dL (ref 8.4–10.5)
Creatinine, Ser: 0.57 mg/dL (ref 0.50–1.10)
GFR calc Af Amer: >90 mL/min (ref >90)
GFR calc non Af Amer: >90 mL/min (ref >90)
Sodium: 139 mEq/L (ref 135–145)

## 2013-03-01 LAB — CBC
MCH: 23.9 pg — ABNORMAL LOW (ref 26.0–34.0)
MCHC: 30.1 g/dL (ref 30.0–36.0)
MCV: 79.1 fL (ref 78.0–100.0)
Platelets: 390 10*3/uL (ref 150–400)
RBC: 4.36 MIL/uL (ref 3.87–5.11)
RDW: 17 % — ABNORMAL HIGH (ref 11.5–15.5)

## 2013-03-01 MED ORDER — DOXYCYCLINE HYCLATE 100 MG PO TABS: 100.0000 mg | ORAL_TABLET | Freq: Two times a day (BID) | ORAL | Status: DC

## 2013-03-01 MED ORDER — DOXYCYCLINE HYCLATE 100 MG PO TABS
100.0000 mg | ORAL_TABLET | Freq: Two times a day (BID) | ORAL | Status: DC
  Filled 2013-03-01: qty 1

## 2013-03-01 MED ORDER — LIDOCAINE VISCOUS 2 % MT SOLN: 15.0000 mL | OROMUCOSAL | Status: DC | PRN

## 2013-03-01 NOTE — Progress Notes (Signed)
PHARMACIST - PHYSICIAN COMMUNICATION CONCERNING: Antibiotic IV to Oral Route Change Policy  RECOMMENDATION: This patient is receiving Doxycycline by the intravenous route.  Based on criteria approved by the Pharmacy and Therapeutics Committee, the antibiotic(s) is/are being converted to the equivalent oral dose form(s).   DESCRIPTION: These criteria include:  Patient being treated for a respiratory tract infection, urinary tract infection, or cellulitis  The patient is not neutropenic and does not exhibit a GI malabsorption state  The patient is eating (either orally or via tube) and/or has been taking other orally administered medications for a least 24 hours  The patient is improving clinically and has a Tmax < 100.5  If you have questions about this conversion, please contact the Pharmacy Department  []   [phone] )  [facility] []   [phone] )  [facility]  []   [phone] )  [facility] [x]   [phone] )  [facility]    Geoffry Paradise, PharmD, BCPS Pager: [phone] 9:23 AM Pharmacy #: [phone]

## 2013-03-01 NOTE — Progress Notes (Signed)
Patient discharged home via private vehicle, discharge instructions and prescriptions reviewed with patient and husband using teach back method.

## 2013-03-01 NOTE — Discharge Summary (Signed)
Physician Discharge Summary  Stacey [name] Husch ZOX:[id number] DOB: July 29, 1966 DOA: 02/27/2013  PCP: Kaleen Mask, MD  Admit date: 02/27/2013 Discharge date: 03/01/2013  Time spent: 40 minutes  Recommendations for Outpatient Follow-up:  1. Follow up with primary MD. 2. Follow up with Dr Arlan Organ, primary hematologist.   Discharge Diagnoses:  Principal Problem:   Chest pain Active Problems:   BIPOLAR DISORDER UNSPECIFIED   SHORTNESS OF BREATH (SOB)   Discharge Condition: Satisfactory.   Diet recommendation: Regular.   Filed Weights   02/28/13 0035  Weight: 167.1 kg (368 lb 6.2 oz)    History of present illness:  47 y.o. female with history of Allergic rhinitis, bronchial asthma, morbid obesity, OSA on CPAP, anxiety, thyroid nodule, bipolar disorder, beta thalassemia minor, iron deficiency and B12 deficiency anemia, presenting to the ER because of chest pressure and shortness of breath. Patient has been having these symptoms for last 2 days. Initially symptoms were only exertional, then became more persistent and at rest also. In the ER chest x-ray and cardiac markers and D-Dimer were unremarkable. EKG showed sinus tachycardia. Patient does have anemia, hemoglobin is around her baseline and she did receive IV iron on 02/26/13 from her hematologist, Dr Arlan Organ. Patient also has noticed a rash on both her lower extremity since 02/26/13, for which she was given Nystatin powder. Patient otherwise denies any nausea vomiting abdominal pain fever chills or diarrhea. Has been having some cough. Admitted for further evaluation and management.    Hospital Course:  1. Chest pain/Dyspnea: Patient presented with progressive SOB/chest discomfort. CXR was negative for acute findings, D-Dimer was 0.41, ProBNP only 9.0. 12-Lead EKG showed no acute ischemic changes, and cardiac enzymes remained unelevated. 2D Echocardiogram showed normal LV cavity size, EF of 60% to 65%, grade 1  diastolic dysfunction and mildly dilated LA. Symptoms appear multi-factorial, secondary to bronchial asthma, OSA/OHS, deconditioning and moderate anemia. Patient remained asymptomatic throughout hospitalization and O2 saturation was 95%-99% on RA.  2. Anemia: Patient has known chronic anemia, secondary to iron/B12 deficiency, under care of Dr Arlan Organ. She has had extensive workup with colonoscopy/EGD done in 12/2012, which was unremarkable. FOBT was negative on 02/27/13. She is status post iv iron infusion on 02/26/13. HB at presentation, was 8.6, against a baseline HB of 10.1 on 02/26/13. She had no clinical eveidence of acute bleed. Given symptomatic anemia, she was transfused 2 units PRBC, with satisfactory bump in HB to 10.4. Patient will continue to follow up with Dr Myna Hidalgo.  3. Lower extremity erythema: Patient had redness/sorerness both lower legs, consistent with cellulitis. Managed with Doxycycline, wcc remained normal, patient remained apyrexial. As of 03/01/13, local inflammatory phenomena had greatly improved. tTransitioned to oral Doxycycline, to be concluded on 03/08/13.  4. Bipolar disorder/Anxiety: Stable. On re-admission medications.  5. OSA: Continued on nocturnal CPAP.  5. Bronchial Asthma: No clinical exacerbation. Prn Bronchodilators.  6. Aphthous Ulcers: Patient complained of mouth pain on 02/28/13, and physical examination revealed apthous ulcers on dorsum of tongue. Managed with 2% Viscous Lidocaine, with considerable amelioration.     Procedures:  See Below.   2D Echocardiogram.   Consultations:  N/A.   Discharge Exam: Filed Vitals:   02/28/13 2045 02/28/13 2050 03/01/13 0500 03/01/13 0748  BP:  106/73 134/81   Pulse:  112 90   Temp:  99.4 F (37.4 C) 97.9 F (36.6 C)   TempSrc:  Oral Oral   Resp: 22 20 16    Height:  Weight:      SpO2:  99% 99% 95%    General: Comfortable, alert, communicative, fully oriented, not short of breath at rest.  HEENT: Moderate  clinical pallor, no jaundice, no conjunctival injection or discharge. Has 2 aphthous ulcers on dorsum of tongue.  NECK: Supple, JVP not seen, no carotid bruits, no palpable lymphadenopathy, no palpable goiter.  CHEST: Clinically clear to auscultation, no wheezes, no crackles.  HEART: Sounds 1 and 2 heard, normal, regular, no murmurs.  ABDOMEN: Morbidly obese, soft, non-tender, no palpable organomegaly, no palpable masses, normal bowel sounds.  GENITALIA: Not examined.  LOWER EXTREMITIES: Minimal pitting edema, much improved LE erythema, palpable peripheral pulses.  MUSCULOSKELETAL SYSTEM: Unremarkable.  CENTRAL NERVOUS SYSTEM: No focal neurologic deficit on gross examination.  Discharge Instructions      Discharge Orders   Future Appointments Provider Department Dept Phone   03/04/2013 9:00 AM [facility] [phone]   03/04/2013 9:30 AM [facility] [phone]   NPO after midnight.   03/05/2013 10:00 AM [facility] Chair [facility] [place] [facility] [phone]   03/23/2013 11:00 AM [facility] Inj Nurse [place] [facility] [phone]   04/27/2013 3:00 PM Rachael Fee [facility] [phone]   04/27/2013 3:30 PM Josph Macho, MD [place] [phone]   04/27/2013 4:00 PM [facility] Inj Nurse [place] [facility] [phone]   06/12/2013 3:30 PM Nilda Riggs, NP [facility] [phone]   Future Orders Complete By Expires     Diet general  As directed     Increase activity slowly  As directed         Medication List         albuterol 108 (90 BASE) MCG/ACT inhaler  Commonly known as:  PROVENTIL HFA;VENTOLIN HFA  Inhale 2 puffs into the lungs 3 (three) times daily.     aspirin 81 MG tablet  Take 81 mg by mouth daily.     beclomethasone 80 MCG/ACT inhaler  Commonly known  as:  QVAR  Inhale 2 puffs into the lungs 3 (three) times daily.     clonazePAM 0.5 MG tablet  Commonly known as:  KLONOPIN  Take 0.5 mg by mouth 2 (two) times daily as needed for anxiety.     doxycycline 100 MG tablet  Commonly known as:  VIBRA-TABS  Take 1 tablet (100 mg total) by mouth every 12 (twelve) hours.     EPINEPHrine 0.3 mg/0.3 mL Devi  Commonly known as:  EPI-PEN  Inject 0.3 mg into the muscle as needed (for allergic reaction).     gabapentin 300 MG capsule  Commonly known as:  NEURONTIN  Take 300-600 mg by mouth 3 (three) times daily. 300mg  in am, 300mg  at 2pm, and 600mg  at bedtime.     hyoscyamine 0.125 MG tablet  Commonly known as:  LEVSIN, ANASPAZ  Take 0.125 mg by mouth every 4 (four) hours as needed for cramping.     ibuprofen 200 MG tablet  Commonly known as:  ADVIL,MOTRIN  Take 600 mg by mouth every 6 (six) hours as needed for pain.     lidocaine 2 % solution  Commonly known as:  XYLOCAINE  Take 15 mLs by mouth every 4 (four) hours as needed (Oral pain.).     loratadine 10 MG tablet  Commonly known as:  CLARITIN  Take 10 mg  by mouth every morning.     methocarbamol 500 MG tablet  Commonly known as:  ROBAXIN  Take 250-500 mg by mouth 3 (three) times daily.     nystatin cream  Commonly known as:  MYCOSTATIN  Apply to affected area 2 times daily     ondansetron 4 MG tablet  Commonly known as:  ZOFRAN  Take 4 mg by mouth 2 (two) times daily as needed for nausea (migraines).     OVER THE COUNTER MEDICATION  Take 1 tablet by mouth daily. Now brand Respir-all respiratory support     pantoprazole 40 MG tablet  Commonly known as:  PROTONIX  Take 40 mg by mouth every morning.     rizatriptan 10 MG disintegrating tablet  Commonly known as:  MAXALT-MLT  Take 10 mg by mouth daily as needed for migraine. May repeat in 2 hours if needed for migraines     ziprasidone 20 MG capsule  Commonly known as:  GEODON  Take 20 mg by mouth 2 (two) times daily with  a meal. 1 tab in AM and 2 in PM       Allergies  Allergen Reactions  . Bupropion Hcl Other (See Comments)    swelling in hands  . Capzasin (Capsaicin) Other (See Comments)    Red and burning  . Citalopram Hydrobromide Other (See Comments)    Doesn't remember reaction  . Other Other (See Comments)    Bleach cause vocal cord to close  . Stevia (Stevioside) Swelling    Face and throat swell   . Strawberry Other (See Comments)    Caused facial swelling and rash  . Lamictal (Lamotrigine) Rash  . Latex Other (See Comments)    Blisters where the latex touched it   Follow-up Information   Schedule an appointment as soon as possible for a visit with Kaleen Mask, MD.   Contact information:   [address] [place] [address] [phone]       Schedule an appointment as soon as possible for a visit with Josph Macho, MD.   Contact information:   [address] [place] [address] [phone]        The results of significant diagnostics from this hospitalization (including imaging, microbiology, ancillary and laboratory) are listed below for reference.    Significant Diagnostic Studies: Dg Chest 2 View  02/27/2013   *RADIOLOGY REPORT*  Clinical Data: Shortness of breath and cough.  CHEST - 2 VIEW  Comparison: 11/24/2012.  Findings: The cardiac silhouette, mediastinal and hilar contours are within normal limits and stable.  The lungs are clear.  No pleural effusion or pneumothorax.  The bony thorax is intact. There is artifact overlying the right upper chest.  IMPRESSION: No acute cardiopulmonary findings.   Original Report Authenticated By: Rudie Meyer, M.D.    Microbiology: No results found for this or any previous visit (from the past 240 hour(s)).   Labs: Basic Metabolic Panel:  Recent Labs Lab 02/27/13 1755 02/28/13 0105 03/01/13 0532  NA 139 139 139  K 4.2 3.7 3.9  CL 104 105 105  CO2 28 25 25   GLUCOSE 142* 146* 144*   BUN 5* 5* 5*  CREATININE 0.51 0.55 0.57  CALCIUM 8.9 9.0 9.3   Liver Function Tests:  Recent Labs Lab 02/27/13 1755  AST 36  ALT 41*  ALKPHOS 144*  BILITOT 0.3  PROT 6.7  ALBUMIN 3.0*   No results found for this basename: LIPASE, AMYLASE,  in  the last 168 hours No results found for this basename: AMMONIA,  in the last 168 hours CBC:  Recent Labs Lab 02/23/13 1518 02/27/13 1755 02/28/13 0105 03/01/13 0532  WBC 10.7* 9.1 10.1 8.8  NEUTROABS 8.8*  --   --   --   HGB 10.1* 8.6* 8.8* 10.4*  HCT 33.8* 29.9* 29.9* 34.5*  MCV 79* 77.3* 77.9* 79.1  PLT 374 350 395 390   Cardiac Enzymes:  Recent Labs Lab 02/28/13 0105 02/28/13 1407 02/28/13 2000  TROPONINI <0.30 <0.30 <0.30   BNP: BNP (last 3 results)  Recent Labs  02/27/13 1755  PROBNP 9.0   CBG: No results found for this basename: GLUCAP,  in the last 168 hours     Signed:  Teja [name],CHRISTOPHER  [facility] 03/01/2013, 11:38 AM

## 2013-03-04 ENCOUNTER — Ambulatory Visit (HOSPITAL_COMMUNITY)
Admission: RE | Admit: 2013-03-04 | Discharge: 2013-03-04 | Disposition: A | Discharge status: Home / Self Care | Payer: Medicaid Other | Source: Ambulatory Visit | Attending: Hematology & Oncology | Admitting: Hematology & Oncology

## 2013-03-04 DIAGNOSIS — R109 Unspecified abdominal pain: Secondary | ICD-10-CM | POA: Insufficient documentation

## 2013-03-04 DIAGNOSIS — E041 Nontoxic single thyroid nodule: Secondary | ICD-10-CM

## 2013-03-04 DIAGNOSIS — K7689 Other specified diseases of liver: Secondary | ICD-10-CM | POA: Insufficient documentation

## 2013-03-04 DIAGNOSIS — K8020 Calculus of gallbladder without cholecystitis without obstruction: Secondary | ICD-10-CM | POA: Insufficient documentation

## 2013-03-05 ENCOUNTER — Ambulatory Visit: Payer: Self-pay

## 2013-03-06 ENCOUNTER — Telehealth: Payer: Self-pay | Admitting: *Deleted

## 2013-03-06 NOTE — Telephone Encounter (Addendum)
Message copied by Wynonia Hazard on Fri Mar 06, 2013  3:44 PM ------      Message from: Arlan Organ R      Created: Wed Mar 04, 2013  4:25 PM       Call - thyroid ultrasound looks okay. No nodules. She does have gallstones but these have not changed in several years. Thanks.  Gave pt the above message and explained that he wishes to have her come in one day next week to receive another dose of iron in light of her recent hospitalization. Kelly scheduled this for the pt.

## 2013-03-06 NOTE — Telephone Encounter (Signed)
Patient called and spoke with Amy.  Per Amy to sch iron apt for next week.  Patient sch apt for 03/09/13

## 2013-03-06 NOTE — Telephone Encounter (Signed)
Message copied by Wynonia Hazard on Fri Mar 06, 2013  3:37 PM ------      Message from: Arlan Organ R      Created: Wed Mar 04, 2013  4:25 PM       Call - thyroid ultrasound looks okay. No nodules. She does have gallstones but these have not changed in several years. Thanks. ------

## 2013-03-08 ENCOUNTER — Encounter (HOSPITAL_COMMUNITY): Payer: Self-pay | Admitting: *Deleted

## 2013-03-08 ENCOUNTER — Emergency Department (HOSPITAL_COMMUNITY): Payer: Medicaid Other

## 2013-03-08 ENCOUNTER — Emergency Department (HOSPITAL_COMMUNITY)
Admission: EM | Admit: 2013-03-08 | Discharge: 2013-03-08 | Disposition: A | Discharge status: Home / Self Care | Payer: Medicaid Other | Attending: Emergency Medicine | Admitting: Emergency Medicine

## 2013-03-08 DIAGNOSIS — Z8669 Personal history of other diseases of the nervous system and sense organs: Secondary | ICD-10-CM | POA: Insufficient documentation

## 2013-03-08 DIAGNOSIS — F329 Major depressive disorder, single episode, unspecified: Secondary | ICD-10-CM | POA: Diagnosis present

## 2013-03-08 DIAGNOSIS — R0602 Shortness of breath: Secondary | ICD-10-CM

## 2013-03-08 DIAGNOSIS — F315 Bipolar disorder, current episode depressed, severe, with psychotic features: Secondary | ICD-10-CM | POA: Diagnosis present

## 2013-03-08 DIAGNOSIS — Z91040 Latex allergy status: Secondary | ICD-10-CM | POA: Insufficient documentation

## 2013-03-08 DIAGNOSIS — R42 Dizziness and giddiness: Secondary | ICD-10-CM | POA: Insufficient documentation

## 2013-03-08 DIAGNOSIS — F411 Generalized anxiety disorder: Secondary | ICD-10-CM | POA: Insufficient documentation

## 2013-03-08 DIAGNOSIS — Z79899 Other long term (current) drug therapy: Secondary | ICD-10-CM | POA: Insufficient documentation

## 2013-03-08 DIAGNOSIS — F319 Bipolar disorder, unspecified: Secondary | ICD-10-CM | POA: Insufficient documentation

## 2013-03-08 DIAGNOSIS — R059 Cough, unspecified: Secondary | ICD-10-CM | POA: Insufficient documentation

## 2013-03-08 DIAGNOSIS — Z862 Personal history of diseases of the blood and blood-forming organs and certain disorders involving the immune mechanism: Secondary | ICD-10-CM | POA: Insufficient documentation

## 2013-03-08 DIAGNOSIS — Z8709 Personal history of other diseases of the respiratory system: Secondary | ICD-10-CM | POA: Insufficient documentation

## 2013-03-08 DIAGNOSIS — Z87891 Personal history of nicotine dependence: Secondary | ICD-10-CM | POA: Insufficient documentation

## 2013-03-08 DIAGNOSIS — Z8639 Personal history of other endocrine, nutritional and metabolic disease: Secondary | ICD-10-CM | POA: Insufficient documentation

## 2013-03-08 DIAGNOSIS — R45851 Suicidal ideations: Secondary | ICD-10-CM | POA: Insufficient documentation

## 2013-03-08 DIAGNOSIS — Z3202 Encounter for pregnancy test, result negative: Secondary | ICD-10-CM | POA: Insufficient documentation

## 2013-03-08 DIAGNOSIS — IMO0002 Reserved for concepts with insufficient information to code with codable children: Secondary | ICD-10-CM | POA: Insufficient documentation

## 2013-03-08 DIAGNOSIS — Z7982 Long term (current) use of aspirin: Secondary | ICD-10-CM | POA: Insufficient documentation

## 2013-03-08 DIAGNOSIS — J45901 Unspecified asthma with (acute) exacerbation: Secondary | ICD-10-CM | POA: Insufficient documentation

## 2013-03-08 DIAGNOSIS — R05 Cough: Secondary | ICD-10-CM | POA: Insufficient documentation

## 2013-03-08 LAB — RAPID URINE DRUG SCREEN, HOSP PERFORMED
Cocaine: NOT DETECTED
Opiates: NOT DETECTED

## 2013-03-08 LAB — POCT I-STAT TROPONIN I: Troponin i, poc: 0 ng/mL (ref 0.00–0.08)

## 2013-03-08 LAB — POCT I-STAT, CHEM 8
Calcium, Ion: 1.19 mmol/L (ref 1.12–1.23)
Chloride: 109 mEq/L (ref 96–112)
HCT: 39 % (ref 36.0–46.0)
Potassium: 4.5 mEq/L (ref 3.5–5.1)

## 2013-03-08 LAB — CBC WITH DIFFERENTIAL/PLATELET
Basophils Absolute: 0 10*3/uL (ref 0.0–0.1)
Eosinophils Relative: 0 % (ref 0–5)
Lymphocytes Relative: 7 % — ABNORMAL LOW (ref 12–46)
Lymphs Abs: 0.8 10*3/uL (ref 0.7–4.0)
MCV: 81.3 fL (ref 78.0–100.0)
Neutro Abs: 9.7 10*3/uL — ABNORMAL HIGH (ref 1.7–7.7)
Neutrophils Relative %: 89 % — ABNORMAL HIGH (ref 43–77)
Platelets: 296 10*3/uL (ref 150–400)
RBC: 4.64 MIL/uL (ref 3.87–5.11)
RDW: 20.6 % — ABNORMAL HIGH (ref 11.5–15.5)
WBC: 10.9 10*3/uL — ABNORMAL HIGH (ref 4.0–10.5)

## 2013-03-08 LAB — COMPREHENSIVE METABOLIC PANEL
BUN: 7 mg/dL (ref 6–23)
CO2: 22 mEq/L (ref 19–32)
Chloride: 101 mEq/L (ref 96–112)
Creatinine, Ser: 0.44 mg/dL — ABNORMAL LOW (ref 0.50–1.10)
GFR calc Af Amer: >90 mL/min (ref >90)
GFR calc non Af Amer: >90 mL/min (ref >90)
Total Bilirubin: 0.3 mg/dL (ref 0.3–1.2)

## 2013-03-08 LAB — PREGNANCY, URINE: Preg Test, Ur: NEGATIVE

## 2013-03-08 LAB — ACETAMINOPHEN LEVEL: Acetaminophen (Tylenol), Serum: <15 ug/mL (ref 10–30)

## 2013-03-08 MED ORDER — ZIPRASIDONE HCL 20 MG PO CAPS
20.0000 mg | ORAL_CAPSULE | Freq: Two times a day (BID) | ORAL | Status: DC
  Administered 2013-03-08: 20 mg via ORAL
  Filled 2013-03-08: qty 1

## 2013-03-08 MED ORDER — ALBUTEROL SULFATE (5 MG/ML) 0.5% IN NEBU
2.5000 mg | INHALATION_SOLUTION | Freq: Once | RESPIRATORY_TRACT | Status: AC
  Administered 2013-03-08: 2.5 mg via RESPIRATORY_TRACT
  Filled 2013-03-08: qty 0.5

## 2013-03-08 MED ORDER — ONDANSETRON HCL 4 MG PO TABS: 4.0000 mg | ORAL_TABLET | Freq: Three times a day (TID) | ORAL | Status: DC | PRN

## 2013-03-08 MED ORDER — DOXYCYCLINE HYCLATE 100 MG PO TABS: 100.0000 mg | ORAL_TABLET | Freq: Two times a day (BID) | ORAL | Status: DC

## 2013-03-08 MED ORDER — RIZATRIPTAN BENZOATE 10 MG PO TBDP: 10.0000 mg | ORAL_TABLET | Freq: Every day | ORAL | Status: DC | PRN

## 2013-03-08 MED ORDER — GABAPENTIN 300 MG PO CAPS: 300.0000 mg | ORAL_CAPSULE | ORAL | Status: DC

## 2013-03-08 MED ORDER — SUMATRIPTAN SUCCINATE 25 MG PO TABS
25.0000 mg | ORAL_TABLET | Freq: Every day | ORAL | Status: DC | PRN
  Filled 2013-03-08: qty 1

## 2013-03-08 MED ORDER — ZOLPIDEM TARTRATE 5 MG PO TABS: 5.0000 mg | ORAL_TABLET | Freq: Every evening | ORAL | Status: DC | PRN

## 2013-03-08 MED ORDER — ALBUTEROL SULFATE HFA 108 (90 BASE) MCG/ACT IN AERS
2.0000 | INHALATION_SPRAY | Freq: Three times a day (TID) | RESPIRATORY_TRACT | Status: DC
  Administered 2013-03-08: 2 via RESPIRATORY_TRACT
  Filled 2013-03-08: qty 6.7

## 2013-03-08 MED ORDER — LORAZEPAM 1 MG PO TABS: 1.0000 mg | ORAL_TABLET | Freq: Three times a day (TID) | ORAL | Status: DC | PRN

## 2013-03-08 MED ORDER — IBUPROFEN 200 MG PO TABS: 600.0000 mg | ORAL_TABLET | Freq: Three times a day (TID) | ORAL | Status: DC | PRN

## 2013-03-08 MED ORDER — PANTOPRAZOLE SODIUM 40 MG PO TBEC: 40.0000 mg | DELAYED_RELEASE_TABLET | Freq: Every morning | ORAL | Status: DC

## 2013-03-08 MED ORDER — LORATADINE 10 MG PO TABS: 10.0000 mg | ORAL_TABLET | Freq: Every morning | ORAL | Status: DC

## 2013-03-08 MED ORDER — ACETAMINOPHEN 325 MG PO TABS: 650.0000 mg | ORAL_TABLET | ORAL | Status: DC | PRN

## 2013-03-08 MED ORDER — CLONAZEPAM 0.5 MG PO TABS: 0.5000 mg | ORAL_TABLET | Freq: Two times a day (BID) | ORAL | Status: DC | PRN

## 2013-03-08 MED ORDER — FLUTICASONE PROPIONATE HFA 44 MCG/ACT IN AERO
2.0000 | INHALATION_SPRAY | Freq: Two times a day (BID) | RESPIRATORY_TRACT | Status: DC
  Filled 2013-03-08: qty 10.6

## 2013-03-08 MED ORDER — GABAPENTIN 300 MG PO CAPS: 300.0000 mg | ORAL_CAPSULE | Freq: Three times a day (TID) | ORAL | Status: DC

## 2013-03-08 MED ORDER — NYSTATIN 100000 UNIT/GM EX CREA
1.0000 "application " | TOPICAL_CREAM | Freq: Two times a day (BID) | CUTANEOUS | Status: DC | PRN
  Filled 2013-03-08: qty 15

## 2013-03-08 MED ORDER — DIPHENHYDRAMINE HCL 25 MG PO TABS
25.0000 mg | ORAL_TABLET | Freq: Four times a day (QID) | ORAL | Status: DC | PRN
  Filled 2013-03-08: qty 1

## 2013-03-08 MED ORDER — ALUM & MAG HYDROXIDE-SIMETH 200-200-20 MG/5ML PO SUSP: 30.0000 mL | ORAL | Status: DC | PRN

## 2013-03-08 MED ORDER — IPRATROPIUM BROMIDE 0.02 % IN SOLN
0.5000 mg | Freq: Once | RESPIRATORY_TRACT | Status: AC
  Administered 2013-03-08: 0.5 mg via RESPIRATORY_TRACT
  Filled 2013-03-08: qty 2.5

## 2013-03-08 MED ORDER — HYOSCYAMINE SULFATE 0.125 MG PO TABS: 0.1250 mg | ORAL_TABLET | ORAL | Status: DC | PRN

## 2013-03-08 MED ORDER — NICOTINE 21 MG/24HR TD PT24: 21.0000 mg | MEDICATED_PATCH | Freq: Every day | TRANSDERMAL | Status: DC

## 2013-03-08 MED ORDER — GABAPENTIN 300 MG PO CAPS
600.0000 mg | ORAL_CAPSULE | Freq: Every day | ORAL | Status: DC
  Filled 2013-03-08: qty 2

## 2013-03-08 MED ORDER — ASPIRIN 81 MG PO CHEW: 81.0000 mg | CHEWABLE_TABLET | Freq: Every day | ORAL | Status: DC

## 2013-03-08 MED ORDER — ONDANSETRON HCL 4 MG PO TABS: 4.0000 mg | ORAL_TABLET | Freq: Two times a day (BID) | ORAL | Status: DC | PRN

## 2013-03-08 NOTE — ED Notes (Signed)
Written dc instructions reviewed w/ pt.  Pt verbalized understanding.  Pt encouraged to return/seek treatment for any thoughts of harming herself or others and to [facility] w/ [facility] as [facility].  Pt also encouraged to follow up w/ her medical dr concerning her blood sugars. Pt verbalized understanding

## 2013-03-08 NOTE — BHH Counselor (Signed)
Pt given resources to [facility] for follow up optx services.  Pt will call in the morning to schedule an intake.  No intake on the weekend.  Pt reports poor experience with [facility].  Pt verbalized safety planning and will contact husband to pick her up.    Staffed with NP and she was in agreement with dispo.  Dr. Jodi Mourning came and saw pt and agreed with NP and TTS pt is able to contract reliably and return home.  TTS spoke with pt husband and he agreed "There is always someone home and no, I don't worry she will hurt herself."   Pt was initially placed under IVC but in speaking with Salomon Mast the IVC was never processed due to technical issues and the IVC was not properly filled out.    Dr. Jodi Mourning still evaluated and determined to release pt by completing the examination form to release pt form IVC.  Technically, the IVC was never processed nor served and therefore invalid but a release was completed to insure appropriate measures were in place per policy and in the best interest of pt.

## 2013-03-08 NOTE — ED Provider Notes (Signed)
CSN: [id number]     Arrival date & time 03/08/13  4098 History     First MD Initiated Contact with Patient 03/08/13 0818     Chief Complaint  Patient presents with  . Shortness of Breath  . mental illness    (Consider location/radiation/quality/duration/timing/severity/associated sxs/prior Treatment) HPI Comments: Patient is a 47 year old female with history of asthma, sleep apnea, pernicious and iron deficiency anemia, and bipolar 1 disorder who presents for shortness of breath with onset yesterday. Patient was recently hospitalized for symptomatic anemia from 02/27/13-03/01/13 with blood transfusion. States that yesterday was the first time she "did anything" since hospital d/c. Patient endorses becoming short of breath when exerting herself as well as when at rest. She has tried Claritin, Benadryl, and her albuterol inhaler for symptoms with moderate relief, but doesn't feel like it's gotten her "over the hump". She endorses associated lightheadedness and productive cough with yellow sputum and denies associated fever, dizziness, chest pain, N/V, numbness/tingling, and extremity weakness.  Patient also with c/o of depression. Hx of bipolar 1 d/o with last behavioral health hospitalization 15-20 years ago. She states that she has been feeling depressed because of her recurrent health problems and has had thoughts of swallowing all of her pills to kill herself. She denies attempting suicide recently as well as any homicidal ideations. Denies illicit drug or alcohol use. Patient states that her bipolar d/o is managed by her PCP, Dr. Jeannetta Nap.  Patient is a 47 y.o. female presenting with shortness of breath. The history is provided by the patient. No language interpreter was used.  Shortness of Breath Associated symptoms: cough   Associated symptoms: no chest pain, no fever and no vomiting     Past Medical History  Diagnosis Date  . Asthma   . Allergic rhinitis, cause unspecified   . Bipolar 1  disorder   . Morbid obesity   . Thyroid nodule   . Mental disorder   . 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 Pt has never been tested.   . Anxiety   . OSA (obstructive sleep apnea)     uses cpap does not know settings  . Complication of anesthesia 2004    bp dropped after c section, took 24hrs to feels leg , numbing medicine does not last long when injected into skin  . Difficult intravenous access     hard stick for ivs and lab per pt  . Nasal bone fracture age 46    left side cannot place tube in nostril per pt  . Iron deficiency anemia, unspecified 02/24/2013  . Pernicious anemia 02/24/2013   Past Surgical History  Procedure Laterality Date  . Tubal ligation    . Cesarean section    . Noraplant    . Wisdom tooth extraction    . Esophagogastroduodenoscopy N/A 01/02/2013    Procedure: ESOPHAGOGASTRODUODENOSCOPY (EGD);  Surgeon: Louis Meckel, MD;  Location: [facility];  Service: Endoscopy;  Laterality: N/A;  . Colonoscopy with propofol N/A 01/02/2013    Procedure: COLONOSCOPY WITH PROPOFOL;  Surgeon: Louis Meckel, MD;  Location: [facility];  Service: Endoscopy;  Laterality: N/A;   Family History  Problem Relation Age of Onset  . Breast cancer Mother   . Asthma Mother   . Emphysema Paternal Grandmother   . Diabetes Father    History  Substance Use Topics  . Smoking status: Former Smoker -- 0.50 packs/day for 10 years    Types: Cigarettes    Quit date: 07/30/2006  .  Smokeless tobacco: Never Used  . Alcohol Use: No   OB History   Grav Para Term Preterm Abortions TAB SAB Ect Mult Living   10 6 6  4  4   6      Review of Systems  Constitutional: Negative for fever.  Eyes: Negative for visual disturbance.  Respiratory: Positive for cough and shortness of breath.   Cardiovascular: Negative for chest pain.  Gastrointestinal: Negative for nausea, vomiting and blood in stool.  Genitourinary: Negative for hematuria and vaginal bleeding.  Skin: Negative for pallor.   Neurological: Positive for light-headedness. Negative for dizziness.  All other systems reviewed and are negative.   Allergies  Bupropion hcl; Capzasin; Citalopram hydrobromide; Other; Stevia; Strawberry; Lamictal; and Latex  Home Medications   Current Outpatient Rx  Name  Route  Sig  Dispense  Refill  . albuterol (PROVENTIL HFA;VENTOLIN HFA) 108 (90 BASE) MCG/ACT inhaler   Inhalation   Inhale 2 puffs into the lungs 3 (three) times daily.          Marland Kitchen aspirin 81 MG tablet   Oral   Take 81 mg by mouth daily.         . beclomethasone (QVAR) 80 MCG/ACT inhaler   Inhalation   Inhale 2 puffs into the lungs 3 (three) times daily as needed (for SOB).          . clonazePAM (KLONOPIN) 0.5 MG tablet   Oral   Take 0.5 mg by mouth 2 (two) times daily as needed for anxiety.         . diphenhydrAMINE (BENADRYL) 25 MG tablet   Oral   Take 25 mg by mouth every 6 (six) hours as needed for itching or allergies.         Marland Kitchen doxycycline (VIBRA-TABS) 100 MG tablet   Oral   Take 1 tablet (100 mg total) by mouth every 12 (twelve) hours.   16 tablet   0   . EPINEPHrine (EPI-PEN) 0.3 mg/0.3 mL DEVI   Intramuscular   Inject 0.3 mg into the muscle as needed (for allergic reaction).          . gabapentin (NEURONTIN) 300 MG capsule   Oral   Take 300-600 mg by mouth 3 (three) times daily. 300mg  in am, 300mg  at 2pm, and 600mg  at bedtime.         . hyoscyamine (LEVSIN, ANASPAZ) 0.125 MG tablet   Oral   Take 0.125 mg by mouth every 4 (four) hours as needed for cramping.         . lidocaine (XYLOCAINE) 2 % solution   Oral   Take 15 mLs by mouth every 4 (four) hours as needed (Oral pain.).   100 mL   0   . loratadine (CLARITIN) 10 MG tablet   Oral   Take 10 mg by mouth every morning.          . methocarbamol (ROBAXIN) 500 MG tablet   Oral   Take 250-500 mg by mouth 3 (three) times daily.          Marland Kitchen nystatin cream (MYCOSTATIN)   Topical   Apply 1 application topically 2  (two) times daily as needed (to yeast areas).         . ondansetron (ZOFRAN) 4 MG tablet   Oral   Take 4 mg by mouth 2 (two) times daily as needed for nausea (migraines).         . pantoprazole (PROTONIX) 40 MG tablet   Oral  Take 40 mg by mouth every morning.         . predniSONE (DELTASONE) 20 MG tablet   Oral   Take 10-20 mg by mouth daily. For allergies         . rizatriptan (MAXALT-MLT) 10 MG disintegrating tablet   Oral   Take 10 mg by mouth daily as needed for migraine. May repeat in 2 hours if needed for migraines         . ziprasidone (GEODON) 20 MG capsule   Oral   Take 20 mg by mouth 2 (two) times daily with a meal. 1 tab in AM and 2 in PM          BP 133/73  Pulse 88  Temp(Src) 98.5 F (36.9 C) (Oral)  Resp 20  SpO2 95%  LMP 02/20/2013  Physical Exam  Nursing note and vitals reviewed. Constitutional: She is oriented to person, place, and time. She appears well-developed and well-nourished. No distress.  Morbidly obese female  HENT:  Head: Normocephalic and atraumatic.  Mouth/Throat: Oropharynx is clear and moist. No oropharyngeal exudate.  Eyes: Conjunctivae and EOM are normal. Pupils are equal, round, and reactive to light. No scleral icterus.  Neck: Normal range of motion.  Cardiovascular: Normal rate, regular rhythm, normal heart sounds and intact distal pulses.   HR 85  Pulmonary/Chest: Effort normal. No respiratory distress. She has no wheezes. She has no rales.  SpO2 97% on RA; no tachypnea or dyspnea. Mildly diminished breath sounds throughout.  Abdominal: Soft. There is no tenderness.  Musculoskeletal: Normal range of motion.  Neurological: She is alert and oriented to person, place, and time.  Patient speaks in full goal oriented sentences without difficulty or SOB. Moves extremities without ataxia.  Skin: Skin is warm and dry. No rash noted. She is not diaphoretic. No erythema. No pallor.  Psychiatric: Her speech is normal and  behavior is normal. She exhibits a depressed mood. She expresses suicidal ideation. She expresses no homicidal ideation. She expresses no homicidal plans.   ED Course   Procedures (including critical care time)  Labs Reviewed  CBC WITH DIFFERENTIAL - Abnormal; Notable for the following:    WBC 10.9 (*)    Hemoglobin 11.4 (*)    MCH 24.6 (*)    RDW 20.6 (*)    Neutrophils Relative % 89 (*)    Neutro Abs 9.7 (*)    Lymphocytes Relative 7 (*)    All other components within normal limits  COMPREHENSIVE METABOLIC PANEL - Abnormal; Notable for the following:    Glucose, Bld 253 (*)    Creatinine, Ser 0.44 (*)    AST 66 (*)    ALT 75 (*)    Alkaline Phosphatase 143 (*)    All other components within normal limits  SALICYLATE LEVEL - Abnormal; Notable for the following:    Salicylate Lvl <2.0 (*)    All other components within normal limits  POCT I-STAT, CHEM 8 - Abnormal; Notable for the following:    Glucose, Bld 266 (*)    All other components within normal limits  URINE RAPID DRUG SCREEN (HOSP PERFORMED)  ETHANOL  ACETAMINOPHEN LEVEL  PREGNANCY, URINE  POCT I-STAT TROPONIN I   Dg Chest 2 View  03/08/2013   *RADIOLOGY REPORT*  Clinical Data: 47 year old female with shortness of breath.  CHEST - 2 VIEW  Comparison: 02/27/2013  Findings: The cardiomediastinal silhouette is unremarkable. The lungs are clear. There is no evidence of focal airspace disease, pulmonary edema,  suspicious pulmonary nodule/mass, pleural effusion, or pneumothorax. No acute bony abnormalities are identified.  IMPRESSION: No evidence of active cardiopulmonary disease.   Original Report Authenticated By: Harmon Pier, M.D.   1. Shortness of breath   2. Suicidal ideation   3. Bipolar 1 disorder    MDM  10:45 - Patient presents for SOB with onset yesterday. Recently discharged after hospitalization for symptomatic anemia; given transfusion during admission. Physical exam findings as above and patient ambulatory  in ED without tachypnea or dyspnea. Patient without hypoxia on room air. No tachycardia. Patient's Hbg/Hct improved since d/c on 03/01/13; Hbg today 11.4. CXR without evidence of PTX, PNA, pleural effusion, or mass/lesion. D dimer negative and BNP unremarkable when evaluated on 02/27/13. Though patient was hospitalized recently, she is PERC negative correlating to <2% risk of PE. Doubt ACS given atypical nature of symptoms, stable EKG, and troponin of 0.00. Liver enzymes today are mildly elevated; however patient does have hx of mildly elevated AST/ALT and with no abdominal complaints. Believe able to be followed up on with PCP as outpatient for recheck of LFTs. Patient medically cleared. Appropriate for evaluation by ACT and psychiatry. Temp psych hold orders placed.  3:45 - Patient awaiting psychiatric and ACT evaluation. Home meds ordered by Dr. Elesa Massed. Patient signed out to oncoming ED physician, Dr. Jodi Mourning, for dispo.  [name], PA-C 03/08/13 [phone]

## 2013-03-08 NOTE — ED Notes (Signed)
ACT into see 

## 2013-03-08 NOTE — ED Notes (Signed)
Pt uses CPAP at night-husband will bring it from home

## 2013-03-08 NOTE — ED Notes (Signed)
eatting supper, pt's husband will return w./ her clothes, pt is aware.  May give albuterol MDI to patient at dc VOR[facility] dr zvaits

## 2013-03-08 NOTE — ED Provider Notes (Signed)
Date: 03/08/2013  Rate: 88  Rhythm: normal sinus rhythm  QRS Axis: normal  Intervals: normal  ST/T Wave abnormalities: normal  Conduction Disutrbances: none  Narrative Interpretation:   Old EKG Reviewed: No significant changes noted     Lyanne Co, MD 03/08/13 [phone]

## 2013-03-08 NOTE — ED Provider Notes (Signed)
Medical screening examination/treatment/procedure(s) were conducted as a shared visit with non-physician practitioner(s) and myself.  I personally evaluated the patient during the encounter and agree with the stated physical exam and plan of care.  Pt is a 47 y.o. WF history of depression and prior suicide attempt as well as recent admission for iron deficiency anemia requiring blood transfusion who presents emergency Department with shortness of breath. Her vital signs within normal limits. No tachycardia or hypoxia. Her hemoglobin today is 11.4. Chest x-ray is clear. No concerning for ACS the patient has minimal risk factors and is not complaining of chest pain. She is PERC negative and I feel pulmonary embolus is unlikely.  Patient does endorse worsening depression with her recent hospitalization secondary to anemia and suicidality. She endorsed a plan to take pills and overdose at home. Patient is unable to contract for safety. Patient reports she would like to leave the emergency department I do not feel she is safe to make this decision. I have placed patient on IVC and act team has been consulted. Patient is currently medically cleared and is waiting psychiatry disposition.  Stacey Maw Ward, DO 03/08/13 1552

## 2013-03-08 NOTE — ED Notes (Signed)
Pt's husband into see 

## 2013-03-08 NOTE — Consult Note (Signed)
[facility] Psychiatry Consult   Reason for Consult:  Evaluation for inpatient treatment Referring Physician:  EDP  Stacey [name] Stacey [name] is an 47 y.o. female.  Assessment: AXIS I:  Depressive Disorder NOS AXIS II:  Deferred AXIS III:   Past Medical History  Diagnosis Date  . Asthma   . Allergic rhinitis, cause unspecified   . Bipolar 1 disorder   . Morbid obesity   . Thyroid nodule   . Mental disorder   . 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 Pt has never been tested.   . Anxiety   . OSA (obstructive sleep apnea)     uses cpap does not know settings  . Complication of anesthesia 2004    bp dropped after c section, took 24hrs to feels leg , numbing medicine does not last long when injected into skin  . Difficult intravenous access     hard stick for ivs and lab per pt  . Nasal bone fracture age 48    left side cannot place tube in nostril per pt  . Iron deficiency anemia, unspecified 02/24/2013  . Pernicious anemia 02/24/2013   AXIS IV:  Problems related to health AXIS V:  51-60 moderate symptoms  Plan:  No evidence of imminent risk to self or others at present.   Supportive therapy provided about ongoing stressors. Discussed crisis plan, support from social network, calling 911, coming to the Emergency Department, and calling Suicide Hotline.  Subjective:   Stacey [name] is a 47 y.o. female   HPI:  Patient states that she came to [facility] with complaints of shortness of breath and problems with her asthma.  Patient states when asked the question if she was suicidal she stated that she was always like that.  Patient explained that she has been having difficult with chronic anemia, feeling tired/drained/no energy.  Patient stats that she is depressed but has no intent on killing herself.  Patient states that she has attempted several time to get into [facility] for treatment and was unsuccessful that her primary physician is treating depression at this time.  Patient states  that she is willing to do outpatient services if she can find a place to get in.   Patient states that she lives at home with her son and husband.  States that there are no guns in the home and that she is able to contract for safety.  States that she does not feel like she wants to hurt herself in anyway.  Past Psychiatric History: Past Medical History  Diagnosis Date  . Asthma   . Allergic rhinitis, cause unspecified   . Bipolar 1 disorder   . Morbid obesity   . Thyroid nodule   . Mental disorder   . 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 Pt has never been tested.   . Anxiety   . OSA (obstructive sleep apnea)     uses cpap does not know settings  . Complication of anesthesia 2004    bp dropped after c section, took 24hrs to feels leg , numbing medicine does not last long when injected into skin  . Difficult intravenous access     hard stick for ivs and lab per pt  . Nasal bone fracture age 48    left side cannot place tube in nostril per pt  . Iron deficiency anemia, unspecified 02/24/2013  . Pernicious anemia 02/24/2013    reports that she quit smoking about 6 years ago. Her smoking  use included Cigarettes. She has a 5 pack-year smoking history. She has never used smokeless tobacco. She reports that she does not drink alcohol or use illicit drugs. Family History  Problem Relation Age of Onset  . Breast cancer Mother   . Asthma Mother   . Emphysema Paternal Grandmother   . Diabetes Father            Allergies:   Allergies  Allergen Reactions  . Bupropion Hcl Other (See Comments)    swelling in hands  . Capzasin (Capsaicin) Other (See Comments)    Red and burning  . Citalopram Hydrobromide Other (See Comments)    Doesn't remember reaction  . Other Other (See Comments)    Bleach cause vocal cord to close  . Stevia (Stevioside) Swelling    Face and throat swell   . Strawberry Other (See Comments)    Caused facial swelling and rash  . Lamictal (Lamotrigine) Rash   . Latex Other (See Comments)    Blisters where the latex touched it    Past Psychiatric History: Diagnosis:  Depression  Hospitalizations:  None  Outpatient Care:  None other than primary  Substance Abuse Care:  None  Self-Mutilation:  None  Suicidal Attempts:  None  Violent Behaviors:  None   Objective: Blood pressure 133/73, pulse 88, temperature 98.5 F (36.9 C), temperature source Oral, resp. rate 20, last menstrual period 02/20/2013, SpO2 95.00%.There is no weight on file to calculate BMI. Results for orders placed during the hospital encounter of 03/08/13 (from the past 72 hour(s))  CBC WITH DIFFERENTIAL     Status: Abnormal   Collection Time    03/08/13  8:24 AM      Result Value Range   WBC 10.9 (*) 4.0 - 10.5 K/uL   RBC 4.64  3.87 - 5.11 MIL/uL   Hemoglobin 11.4 (*) 12.0 - 15.0 g/dL   HCT 16.1  09.6 - 04.5 %   MCV 81.3  78.0 - 100.0 fL   MCH 24.6 (*) 26.0 - 34.0 pg   MCHC 30.2  30.0 - 36.0 g/dL   RDW 40.9 (*) 81.1 - 91.4 %   Platelets 296  150 - 400 K/uL   Neutrophils Relative % 89 (*) 43 - 77 %   Neutro Abs 9.7 (*) 1.7 - 7.7 K/uL   Lymphocytes Relative 7 (*) 12 - 46 %   Lymphs Abs 0.8  0.7 - 4.0 K/uL   Monocytes Relative 4  3 - 12 %   Monocytes Absolute 0.4  0.1 - 1.0 K/uL   Eosinophils Relative 0  0 - 5 %   Eosinophils Absolute 0.0  0.0 - 0.7 K/uL   Basophils Relative 0  0 - 1 %   Basophils Absolute 0.0  0.0 - 0.1 K/uL  COMPREHENSIVE METABOLIC PANEL     Status: Abnormal   Collection Time    03/08/13  9:18 AM      Result Value Range   Sodium 135  135 - 145 mEq/L   Potassium 4.2  3.5 - 5.1 mEq/L   Chloride 101  96 - 112 mEq/L   CO2 22  19 - 32 mEq/L   Glucose, Bld 253 (*) 70 - 99 mg/dL   BUN 7  6 - 23 mg/dL   Creatinine, Ser 7.82 (*) 0.50 - 1.10 mg/dL   Calcium 9.5  8.4 - 95.6 mg/dL   Total Protein 7.9  6.0 - 8.3 g/dL   Albumin 3.5  3.5 - 5.2 g/dL  AST 66 (*) 0 - 37 U/L   ALT 75 (*) 0 - 35 U/L   Alkaline Phosphatase 143 (*) 39 - 117 U/L   Total  Bilirubin 0.3  0.3 - 1.2 mg/dL   GFR calc non Af Amer >90  >90 mL/min   GFR calc Af Amer >90  >90 mL/min   Comment:            The eGFR has been calculated     using the CKD EPI equation.     This calculation has not been     validated in all clinical     situations.     eGFR's persistently     <90 mL/min signify     possible Chronic Kidney Disease.  ETHANOL     Status: None   Collection Time    03/08/13  9:18 AM      Result Value Range   Alcohol, Ethyl (B) <11  0 - 11 mg/dL   Comment:            LOWEST DETECTABLE LIMIT FOR     SERUM ALCOHOL IS 11 mg/dL     FOR MEDICAL PURPOSES ONLY  SALICYLATE LEVEL     Status: Abnormal   Collection Time    03/08/13  9:18 AM      Result Value Range   Salicylate Lvl <2.0 (*) 2.8 - 20.0 mg/dL  ACETAMINOPHEN LEVEL     Status: None   Collection Time    03/08/13  9:18 AM      Result Value Range   Acetaminophen (Tylenol), Serum <15.0  10 - 30 ug/mL   Comment:            THERAPEUTIC CONCENTRATIONS VARY     SIGNIFICANTLY. A RANGE OF 10-30     ug/mL MAY BE AN EFFECTIVE     CONCENTRATION FOR MANY PATIENTS.     HOWEVER, SOME ARE BEST TREATED     AT CONCENTRATIONS OUTSIDE THIS     RANGE.     ACETAMINOPHEN CONCENTRATIONS     >150 ug/mL AT 4 HOURS AFTER     INGESTION AND >50 ug/mL AT 12     HOURS AFTER INGESTION ARE     OFTEN ASSOCIATED WITH TOXIC     REACTIONS.  URINE RAPID DRUG SCREEN (HOSP PERFORMED)     Status: None   Collection Time    03/08/13  9:22 AM      Result Value Range   Opiates NONE DETECTED  NONE DETECTED   Cocaine NONE DETECTED  NONE DETECTED   Benzodiazepines NONE DETECTED  NONE DETECTED   Amphetamines NONE DETECTED  NONE DETECTED   Tetrahydrocannabinol NONE DETECTED  NONE DETECTED   Barbiturates NONE DETECTED  NONE DETECTED   Comment:            DRUG SCREEN FOR MEDICAL PURPOSES     ONLY.  IF CONFIRMATION IS NEEDED     FOR ANY PURPOSE, NOTIFY LAB     WITHIN 5 DAYS.                LOWEST DETECTABLE LIMITS     FOR URINE  DRUG SCREEN     Drug Class       Cutoff (ng/mL)     Amphetamine      1000     Barbiturate      200     Benzodiazepine   200     Tricyclics       300  Opiates          300     Cocaine          300     THC              50  PREGNANCY, URINE     Status: None   Collection Time    03/08/13  9:22 AM      Result Value Range   Preg Test, Ur NEGATIVE  NEGATIVE   Comment:            THE SENSITIVITY OF THIS     METHODOLOGY IS >20 mIU/mL.  POCT I-STAT TROPONIN I     Status: None   Collection Time    03/08/13  9:28 AM      Result Value Range   Troponin i, poc 0.00  0.00 - 0.08 ng/mL   Comment 3            Comment: Due to the release kinetics of cTnI,     a negative result within the first hours     of the onset of symptoms does not rule out     myocardial infarction with certainty.     If myocardial infarction is still suspected,     repeat the test at appropriate intervals.  POCT I-STAT, CHEM 8     Status: Abnormal   Collection Time    03/08/13  9:30 AM      Result Value Range   Sodium 141  135 - 145 mEq/L   Potassium 4.5  3.5 - 5.1 mEq/L   Chloride 109  96 - 112 mEq/L   BUN 6  6 - 23 mg/dL   Creatinine, Ser 1.61  0.50 - 1.10 mg/dL   Glucose, Bld 096 (*) 70 - 99 mg/dL   Calcium, Ion 0.45  4.09 - 1.23 mmol/L   TCO2 21  0 - 100 mmol/L   Hemoglobin 13.3  12.0 - 15.0 g/dL   HCT 81.1  91.4 - 78.2 %   Current Facility-Administered Medications  Medication Dose Route Frequency Provider Last Rate Last Dose  . acetaminophen (TYLENOL) tablet 650 mg  650 mg Oral Q4H PRN [name], PA-C      . albuterol (PROVENTIL HFA;VENTOLIN HFA) 108 (90 BASE) MCG/ACT inhaler 2 puff  2 puff Inhalation TID Kristen N Ward, DO      . alum & mag hydroxide-simeth (MAALOX/MYLANTA) 200-200-20 MG/5ML suspension 30 mL  30 mL Oral PRN [name], PA-C      . Melene Muller ON 03/09/2013] aspirin chewable tablet 81 mg  81 mg Oral Daily Kristen N Ward, DO      . clonazePAM (KLONOPIN) tablet 0.5 mg  0.5 mg Oral BID PRN  Kristen N Ward, DO      . diphenhydrAMINE (BENADRYL) tablet 25 mg  25 mg Oral Q6H PRN Kristen N Ward, DO      . doxycycline (VIBRA-TABS) tablet 100 mg  100 mg Oral Q12H Kristen N Ward, DO      . fluticasone (FLOVENT HFA) 44 MCG/ACT inhaler 2 puff  2 puff Inhalation BID Kristen N Ward, DO      . [START ON 03/09/2013] gabapentin (NEURONTIN) capsule 300 mg  300 mg Oral Custom Kristen N Ward, DO      . gabapentin (NEURONTIN) capsule 600 mg  600 mg Oral QHS Kristen N Ward, DO      . hyoscyamine (LEVSIN, ANASPAZ) tablet 0.125 mg  0.125 mg Oral Q4H PRN Layla Maw Ward,  DO      . ibuprofen (ADVIL,MOTRIN) tablet 600 mg  600 mg Oral Q8H PRN [name], PA-C      . [START ON 03/09/2013] loratadine (CLARITIN) tablet 10 mg  10 mg Oral q morning - 10a Kristen N Ward, DO      . LORazepam (ATIVAN) tablet 1 mg  1 mg Oral Q8H PRN [name], PA-C      . nicotine (NICODERM CQ - dosed in mg/24 hours) patch 21 mg  21 mg Transdermal Daily [name], PA-C      . nystatin cream (MYCOSTATIN) 1 application  1 application Topical BID PRN Kristen N Ward, DO      . ondansetron (ZOFRAN) tablet 4 mg  4 mg Oral Q8H PRN [name], PA-C      . Melene Muller ON 03/09/2013] pantoprazole (PROTONIX) EC tablet 40 mg  40 mg Oral q morning - 10a Kristen N Ward, DO      . SUMAtriptan (IMITREX) tablet 25 mg  25 mg Oral Daily PRN Kristen N Ward, DO      . ziprasidone (GEODON) capsule 20 mg  20 mg Oral BID WC Kristen N Ward, DO      . zolpidem (AMBIEN) tablet 5 mg  5 mg Oral QHS PRN [name], PA-C       Current Outpatient Prescriptions  Medication Sig Dispense Refill  . albuterol (PROVENTIL HFA;VENTOLIN HFA) 108 (90 BASE) MCG/ACT inhaler Inhale 2 puffs into the lungs 3 (three) times daily.       Marland Kitchen aspirin 81 MG tablet Take 81 mg by mouth daily.      . beclomethasone (QVAR) 80 MCG/ACT inhaler Inhale 2 puffs into the lungs 3 (three) times daily as needed (for SOB).       . clonazePAM (KLONOPIN) 0.5 MG tablet Take 0.5 mg by mouth 2 (two)  times daily as needed for anxiety.      . diphenhydrAMINE (BENADRYL) 25 MG tablet Take 25 mg by mouth every 6 (six) hours as needed for itching or allergies.      Marland Kitchen doxycycline (VIBRA-TABS) 100 MG tablet Take 1 tablet (100 mg total) by mouth every 12 (twelve) hours.  16 tablet  0  . EPINEPHrine (EPI-PEN) 0.3 mg/0.3 mL DEVI Inject 0.3 mg into the muscle as needed (for allergic reaction).       . gabapentin (NEURONTIN) 300 MG capsule Take 300-600 mg by mouth 3 (three) times daily. 300mg  in am, 300mg  at 2pm, and 600mg  at bedtime.      . hyoscyamine (LEVSIN, ANASPAZ) 0.125 MG tablet Take 0.125 mg by mouth every 4 (four) hours as needed for cramping.      . lidocaine (XYLOCAINE) 2 % solution Take 15 mLs by mouth every 4 (four) hours as needed (Oral pain.).  100 mL  0  . loratadine (CLARITIN) 10 MG tablet Take 10 mg by mouth every morning.       . methocarbamol (ROBAXIN) 500 MG tablet Take 250-500 mg by mouth 3 (three) times daily.       Marland Kitchen nystatin cream (MYCOSTATIN) Apply 1 application topically 2 (two) times daily as needed (to yeast areas).      . ondansetron (ZOFRAN) 4 MG tablet Take 4 mg by mouth 2 (two) times daily as needed for nausea (migraines).      . pantoprazole (PROTONIX) 40 MG tablet Take 40 mg by mouth every morning.      . predniSONE (DELTASONE) 20 MG tablet Take 10-20 mg by mouth  daily. For allergies      . rizatriptan (MAXALT-MLT) 10 MG disintegrating tablet Take 10 mg by mouth daily as needed for migraine. May repeat in 2 hours if needed for migraines      . ziprasidone (GEODON) 20 MG capsule Take 20 mg by mouth 2 (two) times daily with a meal. 1 tab in AM and 2 in PM        Psychiatric Specialty Exam:     Blood pressure 133/73, pulse 88, temperature 98.5 F (36.9 C), temperature source Oral, resp. rate 20, last menstrual period 02/20/2013, SpO2 95.00%.There is no weight on file to calculate BMI.  General Appearance: Casual and Fairly Groomed  Patent attorney::  Good  Speech:  Clear  and Coherent and Normal Rate  Volume:  Normal  Mood:  Depressed  Affect:  Appropriate  Thought Process:  Circumstantial, Coherent and Goal Directed  Orientation:  Full (Time, Place, and Person)  Thought Content:  WDL  Suicidal Thoughts:  No  Homicidal Thoughts:  No  Memory:  Immediate;   Good Recent;   Good Remote;   Good  Judgement:  Good  Insight:  Good and Present  Psychomotor Activity:  Normal  Concentration:  Good  Recall:  Good  Akathisia:  No  Handed:  Right  AIMS (if indicated):     Assets:  Communication Skills Desire for Improvement Housing Social Support Transportation  Sleep:      Treatment Plan Summary: disposition:  Resend IVC.  Discharge home.  Give patient resource and contact information for outpatient services.  Assunta Found, FNP-BC 03/08/2013 5:09 PM

## 2013-03-08 NOTE — ED Notes (Signed)
Pt ambulatory to dc area, husband is here waiting w/ her clothes.

## 2013-03-08 NOTE — ED Provider Notes (Signed)
Please see my provider note.  Devery Murgia N Daniyah Fohl, DO 03/08/13 1552 

## 2013-03-08 NOTE — ED Notes (Signed)
Pt reports she was hopsitalized 8/1-8/3 for bipolar, SHOB, blood transfusion. Reports SHOB started yesterday, pt speaking in full sentences, 96% on room air, lung fields clear. Reports productive cough, yellow sputum. Pt reports she has bipolar and is depressed. When asked if she was thinking about hurting herself pt reports yes, when asked if she had a plan she stated "not yet" and later when asked if she wanted to hurt herself "we will see how today goes". Reports she has been sick for 5-6 years and is tired.

## 2013-03-08 NOTE — ED Notes (Signed)
Pt ambulated from Triage to room without any dyspnea or tachypnea.

## 2013-03-09 ENCOUNTER — Telehealth: Payer: Self-pay | Admitting: Oncology

## 2013-03-09 ENCOUNTER — Ambulatory Visit (HOSPITAL_BASED_OUTPATIENT_CLINIC_OR_DEPARTMENT_OTHER): Payer: Medicaid Other

## 2013-03-09 VITALS — BP 125/80 | HR 88 | Temp 97.0°F | Resp 20

## 2013-03-09 DIAGNOSIS — D649 Anemia, unspecified: Secondary | ICD-10-CM

## 2013-03-09 DIAGNOSIS — D509 Iron deficiency anemia, unspecified: Secondary | ICD-10-CM

## 2013-03-09 MED ORDER — SODIUM CHLORIDE 0.9 % IV SOLN
1020.0000 mg | Freq: Once | INTRAVENOUS | Status: AC
  Administered 2013-03-09: 1020 mg via INTRAVENOUS
  Filled 2013-03-09: qty 34

## 2013-03-09 MED ORDER — SODIUM CHLORIDE 0.9 % IV SOLN
Freq: Once | INTRAVENOUS | Status: AC
  Administered 2013-03-09: 15:00:00 via INTRAVENOUS

## 2013-03-09 MED ORDER — CYANOCOBALAMIN 1000 MCG/ML IJ SOLN: 1000.0000 ug | Freq: Once | INTRAMUSCULAR | Status: DC

## 2013-03-09 NOTE — Patient Instructions (Signed)
Ferumoxytol injection What is this medicine? FERUMOXYTOL is an iron complex. Iron is used to make healthy red blood cells, which carry oxygen and nutrients throughout the body. This medicine is used to treat iron deficiency anemia in people with chronic kidney disease. This medicine may be used for other purposes; ask your health care provider or pharmacist if you have questions. What should I tell my health care provider before I take this medicine? They need to know if you have any of these conditions: -anemia not caused by low iron levels -high levels of iron in the blood -magnetic resonance imaging (MRI) test scheduled -an unusual or allergic reaction to iron, other medicines, foods, dyes, or preservatives -pregnant or trying to get pregnant -breast-feeding How should I use this medicine? This medicine is for infusion into a vein. It is given by a health care professional in a hospital or clinic setting. Talk to your pediatrician regarding the use of this medicine in children. Special care may be needed. Overdosage: If you think you've taken too much of this medicine contact a poison control center or emergency room at once. Overdosage: If you think you have taken too much of this medicine contact a poison control center or emergency room at once. NOTE: This medicine is only for you. Do not share this medicine with others. What if I miss a dose? It is important not to miss your dose. Call your doctor or health care professional if you are unable to keep an appointment. What may interact with this medicine? This medicine may interact with the following medications: -other iron products This list may not describe all possible interactions. Give your health care provider a list of all the medicines, herbs, non-prescription drugs, or dietary supplements you use. Also tell them if you smoke, drink alcohol, or use illegal drugs. Some items may interact with your medicine. What should I watch  for while using this medicine? Visit your doctor or healthcare professional regularly. Tell your doctor or healthcare professional if your symptoms do not start to get better or if they get worse. You may need blood work done while you are taking this medicine. You may need to follow a special diet. Talk to your doctor. Foods that contain iron include: whole grains/cereals, dried fruits, beans, or peas, leafy green vegetables, and organ meats (liver, kidney). What side effects may I notice from receiving this medicine? Side effects that you should report to your doctor or health care professional as soon as possible: -allergic reactions like skin rash, itching or hives, swelling of the face, lips, or tongue -breathing problems -changes in blood pressure -feeling faint or lightheaded, falls -fever or chills -flushing, sweating, or hot feelings -swelling of the ankles or feet Side effects that usually do not require medical attention (Report these to your doctor or health care professional if they continue or are bothersome.): -diarrhea -headache -nausea, vomiting -stomach pain This list may not describe all possible side effects. Call your doctor for medical advice about side effects. You may report side effects to FDA at 1-800-FDA-1088. Where should I keep my medicine? This drug is given in a hospital or clinic and will not be stored at home. NOTE: This sheet is a summary. It may not cover all possible information. If you have questions about this medicine, talk to your doctor, pharmacist, or health care provider.  2013, [employer]. (04/07/2008 9:48:25 PM)  

## 2013-03-09 NOTE — Telephone Encounter (Addendum)
Message copied by Lacie Draft on Mon Mar 09, 2013  4:01 PM ------      Message from: Arlan Organ R      Created: Wed Mar 04, 2013  4:25 PM       Call - thyroid ultrasound looks okay. No nodules. She does have gallstones but these have not changed in several years. Thanks. ------Left message on voicemail.

## 2013-03-23 ENCOUNTER — Ambulatory Visit (HOSPITAL_BASED_OUTPATIENT_CLINIC_OR_DEPARTMENT_OTHER): Payer: Medicaid Other

## 2013-03-23 VITALS — BP 115/73 | HR 77 | Temp 98.0°F | Resp 18

## 2013-03-23 DIAGNOSIS — E538 Deficiency of other specified B group vitamins: Secondary | ICD-10-CM

## 2013-03-23 DIAGNOSIS — D649 Anemia, unspecified: Secondary | ICD-10-CM

## 2013-03-23 MED ORDER — CYANOCOBALAMIN 1000 MCG/ML IJ SOLN
1000.0000 ug | Freq: Once | INTRAMUSCULAR | Status: AC
  Administered 2013-03-23: 1000 ug via INTRAMUSCULAR

## 2013-03-23 NOTE — Patient Instructions (Signed)

## 2013-04-27 ENCOUNTER — Ambulatory Visit (HOSPITAL_BASED_OUTPATIENT_CLINIC_OR_DEPARTMENT_OTHER): Payer: Medicaid Other

## 2013-04-27 ENCOUNTER — Other Ambulatory Visit (HOSPITAL_BASED_OUTPATIENT_CLINIC_OR_DEPARTMENT_OTHER): Payer: Medicaid Other | Admitting: Lab

## 2013-04-27 ENCOUNTER — Ambulatory Visit (HOSPITAL_BASED_OUTPATIENT_CLINIC_OR_DEPARTMENT_OTHER): Payer: Medicaid Other | Admitting: Hematology & Oncology

## 2013-04-27 VITALS — BP 127/78 | HR 99 | Temp 98.5°F | Resp 18 | Ht 70.0 in | Wt 367.0 lb

## 2013-04-27 DIAGNOSIS — D649 Anemia, unspecified: Secondary | ICD-10-CM

## 2013-04-27 DIAGNOSIS — D510 Vitamin B12 deficiency anemia due to intrinsic factor deficiency: Secondary | ICD-10-CM

## 2013-04-27 DIAGNOSIS — D509 Iron deficiency anemia, unspecified: Secondary | ICD-10-CM

## 2013-04-27 DIAGNOSIS — D508 Other iron deficiency anemias: Secondary | ICD-10-CM

## 2013-04-27 LAB — CBC WITH DIFFERENTIAL (CANCER CENTER ONLY)
BASO#: 0 10*3/uL (ref 0.0–0.2)
EOS%: 1.8 % (ref 0.0–7.0)
Eosinophils Absolute: 0.1 10*3/uL (ref 0.0–0.5)
HCT: 40.3 % (ref 34.8–46.6)
HGB: 13 g/dL (ref 11.6–15.9)
LYMPH#: 0.9 10*3/uL (ref 0.9–3.3)
MCHC: 32.3 g/dL (ref 32.0–36.0)
NEUT%: 81.5 % — ABNORMAL HIGH (ref 39.6–80.0)
RBC: 4.6 10*6/uL (ref 3.70–5.32)

## 2013-04-27 MED ORDER — CYANOCOBALAMIN 1000 MCG/ML IJ SOLN
1000.0000 ug | Freq: Once | INTRAMUSCULAR | Status: AC
  Administered 2013-04-27: 1000 ug via INTRAMUSCULAR

## 2013-04-27 MED ORDER — CYANOCOBALAMIN 1000 MCG/ML IJ SOLN
INTRAMUSCULAR | Status: AC
  Filled 2013-04-27: qty 1

## 2013-04-27 NOTE — Progress Notes (Signed)
This office note has been dictated.

## 2013-04-28 ENCOUNTER — Telehealth: Payer: Self-pay | Admitting: Oncology

## 2013-04-28 LAB — IRON AND TIBC CHCC
%SAT: 18 % — ABNORMAL LOW (ref 21–57)
Iron: 51 ug/dL (ref 41–142)
TIBC: 281 ug/dL (ref 236–444)
UIBC: 230 ug/dL (ref 120–384)

## 2013-04-28 LAB — FERRITIN CHCC: Ferritin: 191 ng/ml (ref 9–269)

## 2013-04-28 NOTE — Progress Notes (Signed)
CC:   Windle Guard, M.D.  DIAGNOSES: 1. Iron-deficiency anemia. 2. Vitamin B12 deficiency.  CURRENT THERAPY: 1. IV iron as indicated -- patient received a dose of Feraheme about 3     weeks ago. 2. Vitamin B12 1 mg IM monthly.  INTERIM HISTORY:  Ms. Janelle Floor comes in for followup.  She is feeling better.  The iron that we gave her clearly has helped.  We last gave her Feraheme back on August 11th.  She got a prior dose back on May 19th and one on July 31st.  She gets vitamin B12 monthly.  When we last saw her, her iron studies showed a ferritin of 9 with an iron saturation of 2%.  She feels better.  She now is bothered by some fibromyalgia.  This has flared up on her.  She has had some abdominal pain.  We did go ahead and get an abdominal ultrasound on her.  This was done on August 6th.  This showed multiple gallstones.  This was unchanged since 2007.  Otherwise, everything else looked fine.  She still has her monthly cycles.  They do not appear to be too heavy.  She has no tingling in the hands or feet.  She has had a decent appetite.  She was a vegetarian.  She is now eating more "red meat."  PHYSICAL EXAMINATION:  General:  This is a morbidly obese white female in no obvious distress.  Vital signs:  Temperature of 98.5, pulse 99, respiratory rate 18, blood pressure 127/78.  Weight is 367 pounds.  Head and neck:  Normocephalic, atraumatic skull.  There are no ocular or oral lesions.  There are no palpable cervical or supraclavicular lymph nodes. Lungs:  Clear bilaterally.  Cardiac:  Regular rate and rhythm with a normal S1 and S2.  She has no murmurs, rubs, or bruits.  Abdomen:  Soft. She has good bowel sounds.  There is no palpable abdominal mass.  There is no fluid wave.  There is some tenderness about the right side of the abdomen.  There is no palpable hepatosplenomegaly.  Extremities:  Some stasis dermatitis changes in her lower extremities.  She has no  swelling of the joints.  She has some tenderness over the long bones with the fibromyalgia.  She has decent strength in her arms and legs.  Skin:  No rashes or ecchymoses or petechiae.  Neurological.  No focal neurological deficits.  LABORATORY STUDIES:  White cell count is 7.9, hemoglobin 13, hematocrit 40.3, platelet count 254.  MCV is 88.  I looked at her blood smear.  Her red cells are a little larger.  She has no target cells.  I see no nucleated red cells.  White cells appear normal in morphology and maturation.  There are no hypersegmented polys. Platelets are adequate in number and size.  IMPRESSION:  Ms. Janelle Floor is a very nice 47 year old white female. She has multifactorial anemia.  She has both iron-deficiency and pernicious anemia.  We will go ahead and give her a B12 shot today.  We will plan to get her back to see me in 2 months.  I am just glad that her blood counts are better.  I suspect that she likely will need, at some point, some IV iron again.  We did check her B12 level back in July, it was 300.    ______________________________ Josph Macho, M.D. PRE/MEDQ  D:  04/27/2013  T:  04/28/2013  Job:  [id number]

## 2013-04-28 NOTE — Telephone Encounter (Addendum)
Message copied by Lacie Draft on Tue Apr 28, 2013  2:50 PM ------      Message from: Josph Macho      Created: Tue Apr 28, 2013  2:36 PM       call - iron is much better!  Pete ------Spoke with patient.

## 2013-05-25 ENCOUNTER — Ambulatory Visit (HOSPITAL_BASED_OUTPATIENT_CLINIC_OR_DEPARTMENT_OTHER): Payer: Medicaid Other

## 2013-05-25 ENCOUNTER — Other Ambulatory Visit (HOSPITAL_BASED_OUTPATIENT_CLINIC_OR_DEPARTMENT_OTHER): Payer: Medicaid Other | Admitting: Lab

## 2013-05-25 VITALS — BP 117/75 | HR 76 | Temp 98.7°F | Resp 19

## 2013-05-25 DIAGNOSIS — D510 Vitamin B12 deficiency anemia due to intrinsic factor deficiency: Secondary | ICD-10-CM

## 2013-05-25 DIAGNOSIS — D649 Anemia, unspecified: Secondary | ICD-10-CM

## 2013-05-25 DIAGNOSIS — D509 Iron deficiency anemia, unspecified: Secondary | ICD-10-CM

## 2013-05-25 LAB — CBC WITH DIFFERENTIAL (CANCER CENTER ONLY)
BASO%: 0.2 % (ref 0.0–2.0)
EOS%: 1.1 % (ref 0.0–7.0)
HCT: 40 % (ref 34.8–46.6)
LYMPH#: 1 10*3/uL (ref 0.9–3.3)
MCHC: 32.5 g/dL (ref 32.0–36.0)
MONO#: 0.5 10*3/uL (ref 0.1–0.9)
NEUT#: 8.6 10*3/uL — ABNORMAL HIGH (ref 1.5–6.5)
Platelets: 263 10*3/uL (ref 145–400)
RDW: 14.9 % (ref 11.1–15.7)
WBC: 10.2 10*3/uL — ABNORMAL HIGH (ref 3.9–10.0)

## 2013-05-25 MED ORDER — CYANOCOBALAMIN 1000 MCG/ML IJ SOLN
INTRAMUSCULAR | Status: AC
  Filled 2013-05-25: qty 1

## 2013-05-25 MED ORDER — CYANOCOBALAMIN 1000 MCG/ML IJ SOLN
1000.0000 ug | Freq: Once | INTRAMUSCULAR | Status: AC
  Administered 2013-05-25: 1000 ug via INTRAMUSCULAR

## 2013-05-25 NOTE — Patient Instructions (Signed)

## 2013-06-12 ENCOUNTER — Ambulatory Visit: Payer: Medicaid Other | Admitting: Nurse Practitioner

## 2013-06-22 ENCOUNTER — Ambulatory Visit (HOSPITAL_BASED_OUTPATIENT_CLINIC_OR_DEPARTMENT_OTHER): Payer: Medicaid Other

## 2013-06-22 ENCOUNTER — Other Ambulatory Visit (HOSPITAL_BASED_OUTPATIENT_CLINIC_OR_DEPARTMENT_OTHER): Payer: Medicaid Other | Admitting: Lab

## 2013-06-22 ENCOUNTER — Telehealth: Payer: Self-pay | Admitting: Hematology & Oncology

## 2013-06-22 ENCOUNTER — Ambulatory Visit (HOSPITAL_BASED_OUTPATIENT_CLINIC_OR_DEPARTMENT_OTHER): Payer: Medicaid Other | Admitting: Hematology & Oncology

## 2013-06-22 VITALS — BP 121/64 | HR 81 | Temp 98.8°F | Resp 15 | Ht 70.0 in | Wt 360.0 lb

## 2013-06-22 DIAGNOSIS — D649 Anemia, unspecified: Secondary | ICD-10-CM

## 2013-06-22 DIAGNOSIS — D510 Vitamin B12 deficiency anemia due to intrinsic factor deficiency: Secondary | ICD-10-CM

## 2013-06-22 DIAGNOSIS — D509 Iron deficiency anemia, unspecified: Secondary | ICD-10-CM

## 2013-06-22 DIAGNOSIS — E538 Deficiency of other specified B group vitamins: Secondary | ICD-10-CM

## 2013-06-22 LAB — CBC WITH DIFFERENTIAL (CANCER CENTER ONLY)
BASO#: 0 10*3/uL (ref 0.0–0.2)
BASO%: 0.3 % (ref 0.0–2.0)
EOS%: 1.2 % (ref 0.0–7.0)
HCT: 39.9 % (ref 34.8–46.6)
HGB: 13 g/dL (ref 11.6–15.9)
LYMPH#: 1 10*3/uL (ref 0.9–3.3)
MCHC: 32.6 g/dL (ref 32.0–36.0)
MONO#: 0.5 10*3/uL (ref 0.1–0.9)
NEUT#: 7.6 10*3/uL — ABNORMAL HIGH (ref 1.5–6.5)
NEUT%: 82.3 % — ABNORMAL HIGH (ref 39.6–80.0)
RBC: 4.43 10*6/uL (ref 3.70–5.32)
WBC: 9.3 10*3/uL (ref 3.9–10.0)

## 2013-06-22 MED ORDER — CYANOCOBALAMIN 1000 MCG/ML IJ SOLN
INTRAMUSCULAR | Status: AC
  Filled 2013-06-22: qty 1

## 2013-06-22 MED ORDER — CYANOCOBALAMIN 1000 MCG/ML IJ SOLN
1000.0000 ug | Freq: Once | INTRAMUSCULAR | Status: AC
  Administered 2013-06-22: 1000 ug via INTRAMUSCULAR

## 2013-06-22 NOTE — Progress Notes (Signed)
This office note has been dictated.

## 2013-06-22 NOTE — Telephone Encounter (Signed)
Left message with 12-22 and 1-21 appointment times and date and to call for details

## 2013-06-22 NOTE — Patient Instructions (Signed)
Cyanocobalamin, Vitamin B12 injection Qu es este medicamento? La CIANOCOBALAMINA es una forma de vitamina B12 artificial. La vitamina B12 se utiliza para el desarrollo de clulas sanguneas, clulas nerviosas y protenas saludables en el cuerpo. Tambin ayuda con el metabolizacin normal de grasas y carbohidratos. Este medicamento se utiliza para tratar los pacientes que no pueden absorber la vitamina B12. Este medicamento puede ser utilizado para otros usos; si tiene alguna pregunta consulte con su proveedor de atencin mdica o con su farmacutico. MARCAS COMERCIALES DISPONIBLES: Cyomin, LA-12 , Nutri-Twelve , Primabalt Qu le debo informar a mi profesional de la salud antes de tomar este medicamento? Necesita saber si usted presenta alguno de los siguientes problemas o situaciones: -enfermedad renal -enfermedad de Leber -anemia megaloblstica -una reaccin alrgica o inusual a la cianocobalamina, al cobalto, a otros medicamentos, alimentos, colorantes o conservantes -si est embarazada o buscando quedar embarazada -si est amamantando a un beb Cmo debo utilizar este medicamento? Este medicamento se administra mediante inyeccin por va intramuscular o por va subcutnea profunda. Por lo general, lo administra un profesional de la salud en un hospital o en un entorno clnico. Sin embargo, su mdico puede ensearle cmo administrarse sus propias inyecciones. Siga todas las instrucciones. Hable con su pediatra para informarse acerca del uso de este medicamento en nios. Puede requerir atencin especial. Sobredosis: Pngase en contacto inmediatamente con un centro toxicolgico o una sala de urgencia si usted cree que haya tomado demasiado medicamento. ATENCIN: Este medicamento es solo para usted. No comparta este medicamento con nadie. Qu sucede si me olvido de una dosis? Si recibe sus dosis en un entorno clnico o una oficina de mdico, comunquese para programar otra cita. Si se  administra las inyecciones usted mismo y olvida una dosis, sela lo antes posible. Si es casi la hora de la prxima dosis, use slo esa dosis. No use dosis adicionales o dobles. Qu puede interactuar con este medicamento? -colchicina -consumir mucho alcohol Puede ser que esta lista no menciona todas las posibles interacciones. Informe a su profesional de la salud de todos los productos a base de hierbas, medicamentos de venta libre o suplementos nutritivos que est tomando. Si usted fuma, consume bebidas alcohlicas o si utiliza drogas ilegales, indqueselo tambin a su profesional de la salud. Algunas sustancias pueden interactuar con su medicamento. A qu debo estar atento al usar este medicamento? Visite a su mdico o a su profesional de la salud peridicamente. Tal vez necesitar realizarse anlisis de sangre mientras reciba este medicamento. Puede ser necesario seguir una dieta. Consulte a su mdico acerca de esto. Para obtener los mejores resultados, limite su consumo de alcohol y evite fumar. Qu efectos secundarios puedo tener al utilizar este medicamento? Efectos secundarios que debe informar a su mdico o a su profesional de la salud tan pronto como sea posible: -reacciones alrgicas como erupcin cutnea, picazn o urticarias, hinchazn de la cara, labios o lengua -color azulado de la piel -dolor, opresin en el pecho -dificultad al respirar, sibilancias -mareos -rea enrojecido, hinchazn doloroso de la piel Efectos secundarios que, por lo general, no requieren atencin mdica (debe informarlos a su mdico o a su profesional de la salud si persisten o si son molestos): -diarrea -dolor de cabeza Puede ser que esta lista no menciona todos los posibles efectos secundarios. Comunquese a su mdico por asesoramiento mdico sobre los efectos secundarios. Usted puede informar los efectos secundarios a la FDA por telfono al 1-800-FDA-1088. Dnde debo guardar mi medicina? Mantngala  fuera del alcance de los nios.   Gurdela a temperatura ambiente, entre 15 y 30 grados C (59 y 85 grados F). Protjala de la luz. Deseche todo el medicamento que no haya utilizado, despus de la fecha de vencimiento. ATENCIN: Este folleto es un resumen. Puede ser que no cubra toda la posible informacin. Si usted tiene preguntas acerca de esta medicina, consulte con su mdico, su farmacutico o su profesional de la salud.  2014, Elsevier/Gold Standard. (2009-01-11 15:23:45)  

## 2013-06-23 LAB — IRON AND TIBC CHCC
%SAT: 21 % (ref 21–57)
Iron: 55 ug/dL (ref 41–142)
TIBC: 264 ug/dL (ref 236–444)
UIBC: 208 ug/dL (ref 120–384)

## 2013-06-23 LAB — FERRITIN CHCC: Ferritin: 136 ng/ml (ref 9–269)

## 2013-06-24 ENCOUNTER — Telehealth: Payer: Self-pay | Admitting: Nurse Practitioner

## 2013-06-24 ENCOUNTER — Other Ambulatory Visit: Payer: Self-pay | Admitting: Nurse Practitioner

## 2013-06-24 DIAGNOSIS — D509 Iron deficiency anemia, unspecified: Secondary | ICD-10-CM

## 2013-06-24 NOTE — Telephone Encounter (Addendum)
Message copied by Glee Arvin on Wed Jun 24, 2013 10:00 AM ------      Message from: Josph Macho      Created: Tue Jun 23, 2013  6:35 PM       Call - Iron is borderline, so I think that she can benefit from [facility] 1020mg  x 1 dose.  This will help get her thru the holidays!! Please set up!!  Pete ------LVM for pt to contact office regarding above message. An appointment has been made for 07/03/13 @1230 .

## 2013-06-25 LAB — ANTI-PARIETAL ANTIBODY: Parietal Cell Antibody-IgG: NEGATIVE

## 2013-06-25 LAB — INTRINSIC FACTOR ANTIBODIES: Intrinsic Factor: NEGATIVE

## 2013-06-25 LAB — RETICULOCYTES (CHCC): Retic Ct Pct: 2.9 % — ABNORMAL HIGH (ref 0.4–2.3)

## 2013-06-25 LAB — VITAMIN B12: Vitamin B-12: 347 pg/mL (ref 211–911)

## 2013-07-03 ENCOUNTER — Ambulatory Visit (HOSPITAL_BASED_OUTPATIENT_CLINIC_OR_DEPARTMENT_OTHER): Payer: Medicaid Other

## 2013-07-03 VITALS — BP 119/75 | HR 84 | Temp 98.0°F | Resp 16

## 2013-07-03 DIAGNOSIS — D509 Iron deficiency anemia, unspecified: Secondary | ICD-10-CM

## 2013-07-03 MED ORDER — SODIUM CHLORIDE 0.9 % IV SOLN
1020.0000 mg | Freq: Once | INTRAVENOUS | Status: AC
  Administered 2013-07-03: 1020 mg via INTRAVENOUS
  Filled 2013-07-03: qty 34

## 2013-07-03 MED ORDER — SODIUM CHLORIDE 0.9 % IV SOLN
Freq: Once | INTRAVENOUS | Status: AC
  Administered 2013-07-03: 13:00:00 via INTRAVENOUS

## 2013-07-03 NOTE — Patient Instructions (Signed)

## 2013-07-04 NOTE — Progress Notes (Signed)
CC:   Windle Guard, M.D.  DIAGNOSES: 1. Iron deficiency anemia. 2. Vitamin B12 deficiency.  CURRENT THERAPY: 1. IV iron as indicated. 2. Vitamin B12 one milligram IM monthly.  INTERIM HISTORY:  Ms. Venita Lick comes in for followup.  She is feeling a little bit better.  She did have a couple of cycles this month.  This is a little unusual for her.  If this continues to happen, she will see her gynecologist.  When we last saw her in late September, her iron saturation was 18% with a ferritin of 191.  She last received iron back in early August.  She has had no problems with her bowels or bladder.  She has had no rashes.  There has been no cough or shortness breath.  She does have an inhaler that she uses for, I think, asthma and chronic obstructive lung disease.  PHYSICAL EXAMINATION:  General:  This is an obese white female in no obvious distress.  Vital Signs:  Temperature of 98.8, pulse 81, respiratory rate 14, blood pressure 121/64.  Weight is 360 pounds.  Head and Neck:  Normocephalic, atraumatic skull.  There is no scleral icterus.  There are no pale conjunctivae.  She has no intraoral lesions. There is no adenopathy in the neck.  Thyroid is nonpalpable.  Lungs: Clear bilaterally.  Cardiac:  Regular rate and rhythm with a normal S1, S2.  She has no murmurs, rubs or bruits.  Abdomen:  Obese.  Soft.  She has decent bowel sounds.  There is no fluid wave.  There is no obvious hepatosplenomegaly.  Extremities:  Show some minimal erythema on the left lower leg.  No swelling is noted in the legs.  She has good range of motion of her joints.  She has good strength in her muscles.  Skin: Shows no rashes, ecchymosis, or petechia outside of the slight erythema on the left lower leg.  LABORATORY STUDIES:  White cell count is 9.3, hemoglobin 13, hematocrit 39.9, platelet count 287.  MCV is 90.  IMPRESSION:  Ms. Venita Lick is a very charming 47 year old white female.  She has  iron deficiency anemia and B12 deficiency.  I think that her iron might be on the lower side now.  Two months ago, it was borderline.  We will have to see what it is.  If needed, we always can give her IV iron.  She responds well to this.  We will go ahead and give her the B12 today while she is here.  I will plan to see her back in another 2 to 3 months.    ______________________________ Josph Macho, M.D. PRE/MEDQ  D:  06/22/2013  T:  07/03/2013  Job:  [id number]

## 2013-07-10 ENCOUNTER — Encounter: Payer: Self-pay | Admitting: Nurse Practitioner

## 2013-07-10 ENCOUNTER — Ambulatory Visit (INDEPENDENT_AMBULATORY_CARE_PROVIDER_SITE_OTHER): Payer: Medicaid Other | Admitting: Nurse Practitioner

## 2013-07-10 VITALS — BP 128/79 | HR 79 | Ht 70.0 in | Wt 360.0 lb

## 2013-07-10 DIAGNOSIS — R51 Headache: Secondary | ICD-10-CM | POA: Insufficient documentation

## 2013-07-10 DIAGNOSIS — R519 Headache, unspecified: Secondary | ICD-10-CM | POA: Insufficient documentation

## 2013-07-10 NOTE — Progress Notes (Signed)
[facility]  PATIENT: Stacey [name] DOB: 11-Nov-1965   REASON FOR VISIT: headache   HISTORY OF PRESENT ILLNESS:Stacey [name], 47-year-old female returns for followup. She was evaluated 12/09/2012 Dr. Terrace Arabia for headache. Shortly after that time she was found to have low B12 level as well as hemoglobin of 8 requiring transfusion. She is continuing to get B12 injections along with iron infusions at the cancer center. Her headaches are much improved. She is not using Maxalt at all. She returns for reevaluation  HISTORY: evaluation of frequent headaches.  She had past medical history of obesity, bipolar disorder, there was a major change in her bipolar medication in January 2014, she was taking lithium gabapentin, Ativan, but she did not up GI symptoms, nausea, vomiting, she was not able to take medicine in 2-3 weeks,  During that period of time, she began to complain of severe pounding headaches, light noise sensitivity, holocranial headaches, lasting few hours, sleep usually help, she also complains of diffuse body achy pain, worsening depression, she also complains of dizziness, lightheadedness, this led to MRI scan of brain that was normal.   REVIEW OF SYSTEMS: Full 14 system review of systems performed and notable only for those listed, all others are neg:  Constitutional: Fatigue  Cardiovascular: N/A  Ear/Nose/Throat: N/A  Skin: N/A  Eyes: N/A  Respiratory: Cough, wheezing Gastroitestinal: Obstipation  Hematology/Lymphatic: Anemia  Endocrine: N/A Musculoskeletal: Joint pain  Allergy/Immunology: N/A  Neurological: N/A Psychiatric: Depression anxiety, bipolar  ALLERGIES: Allergies  Allergen Reactions  . Bupropion Hcl Other (See Comments)    swelling in hands  . Capzasin [Capsaicin] Other (See Comments)    Red and burning  . Citalopram Hydrobromide Other (See Comments)    Doesn't remember reaction  . Other Other (See Comments)    Bleach cause vocal  cord to close  . Stevia [Stevioside] Swelling    Face and throat swell   . Strawberry Other (See Comments)    Caused facial swelling and rash  . Lamictal [Lamotrigine] Rash  . Latex Other (See Comments)    Blisters where the latex touched it    HOME MEDICATIONS: Outpatient Prescriptions Prior to Visit  Medication Sig Dispense Refill  . albuterol (PROVENTIL HFA;VENTOLIN HFA) 108 (90 BASE) MCG/ACT inhaler Inhale 2 puffs into the lungs 3 (three) times daily.       . beclomethasone (QVAR) 80 MCG/ACT inhaler Inhale 2 puffs into the lungs 3 (three) times daily as needed (for SOB).       Marland Kitchen diphenhydrAMINE (BENADRYL) 25 MG tablet Take 25 mg by mouth every 6 (six) hours as needed for itching or allergies.      Marland Kitchen EPINEPHrine (EPI-PEN) 0.3 mg/0.3 mL DEVI Inject 0.3 mg into the muscle as needed (for allergic reaction).       . gabapentin (NEURONTIN) 300 MG capsule Take 300-600 mg by mouth 3 (three) times daily. 300mg  in am, 300mg  at 2pm, and 600mg  at bedtime.      . hyoscyamine (LEVSIN, ANASPAZ) 0.125 MG tablet Take 0.125 mg by mouth every 4 (four) hours as needed for cramping.      . loratadine (CLARITIN) 10 MG tablet Take 10 mg by mouth every morning.       . meloxicam (MOBIC) 15 MG tablet Take 15 mg by mouth daily.      . methocarbamol (ROBAXIN) 500 MG tablet Take 250-500 mg by mouth 3 (three) times daily.       Marland Kitchen nystatin cream (MYCOSTATIN) Apply 1 application topically  2 (two) times daily as needed (to yeast areas).      . ondansetron (ZOFRAN) 4 MG tablet Take 4 mg by mouth 2 (two) times daily as needed for nausea (migraines).      . pantoprazole (PROTONIX) 40 MG tablet Take 40 mg by mouth every morning.      . rizatriptan (MAXALT-MLT) 10 MG disintegrating tablet Take 10 mg by mouth daily as needed for migraine. May repeat in 2 hours if needed for migraines      . clonazePAM (KLONOPIN) 0.5 MG tablet Take 1 mg by mouth 2 (two) times daily as needed for anxiety.       Marland Kitchen FLUoxetine (PROZAC) 20 MG  capsule Take 40 mg by mouth daily.       . ziprasidone (GEODON) 60 MG capsule Take 60 mg by mouth 2 (two) times daily with a meal.       No facility-administered medications prior to visit.    PAST MEDICAL HISTORY: Past Medical History  Diagnosis Date  . Asthma   . Allergic rhinitis, cause unspecified   . Bipolar 1 disorder   . Morbid obesity   . Thyroid nodule   . Mental disorder   . 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 Pt has never been tested.   . Anxiety   . OSA (obstructive sleep apnea)     uses cpap does not know settings  . Complication of anesthesia 2004    bp dropped after c section, took 24hrs to feels leg , numbing medicine does not last long when injected into skin  . Difficult intravenous access     hard stick for ivs and lab per pt  . Nasal bone fracture age 40    left side cannot place tube in nostril per pt  . Iron deficiency anemia, unspecified 02/24/2013  . Pernicious anemia 02/24/2013    PAST SURGICAL HISTORY: Past Surgical History  Procedure Laterality Date  . Tubal ligation    . Cesarean section    . Noraplant    . Wisdom tooth extraction    . Esophagogastroduodenoscopy N/A 01/02/2013    Procedure: ESOPHAGOGASTRODUODENOSCOPY (EGD);  Surgeon: Louis Meckel, MD;  Location: [facility];  Service: Endoscopy;  Laterality: N/A;  . Colonoscopy with propofol N/A 01/02/2013    Procedure: COLONOSCOPY WITH PROPOFOL;  Surgeon: Louis Meckel, MD;  Location: [facility];  Service: Endoscopy;  Laterality: N/A;    FAMILY HISTORY: Family History  Problem Relation Age of Onset  . Breast cancer Mother   . Asthma Mother   . Emphysema Paternal Grandmother   . Diabetes Father     SOCIAL HISTORY: History   Social History  . Marital Status: Married    Spouse Name: [name]    Number of Children: 4  . Years of Education: Bachelors   Occupational History  . Unemployed    Social History Main Topics  . Smoking status: Former Smoker -- 0.50 packs/day for 10  years    Types: Cigarettes    Quit date: 07/30/2006  . Smokeless tobacco: Never Used  . Alcohol Use: No  . Drug Use: No  . Sexual Activity: Yes    Birth Control/ Protection: Surgical   Other Topics Concern  . Not on file   Social History Narrative   [facility] 4 children 21, 55, 14, 10, she stays at home.   Patient has a Bachelor's degree.   Patient is right- handed.   Patient drinks one  cup of coffee in the morning, sometimes 2 cups.           PHYSICAL EXAM  Filed Vitals:   07/10/13 1420  BP: 128/79  Pulse: 79  Height: 5\' 10"  (1.778 m)  Weight: 360 lb (163.295 kg)   Body mass index is 51.65 kg/(m^2).  Generalized: Well developed, morbidly obese female in no acute distress  Neurological examination   Mentation: Alert oriented to time, place, history taking. Follows all commands speech and language fluent  Cranial nerve II-XII: .Pupils were equal round reactive to light extraocular movements were full, visual field were full on confrontational test. Facial sensation and strength were normal. hearing was intact to finger rubbing bilaterally. Uvula tongue midline. head turning and shoulder shrug were normal and symmetric.Tongue protrusion into cheek strength was normal. Motor: normal bulk and tone, full strength in the BUE, BLE, fine finger movements normal, no pronator drift. No focal weakness  Coordination: finger-nose-finger, heel-to-shin bilaterally, no dysmetria Reflexes: Brachioradialis 2/2, biceps 2/2, triceps 2/2, patellar 2/2, Achilles 2/2, plantar responses were flexor bilaterally. Gait and Station: Rising up from seated position without assistance, normal stance,  moderate stride, good arm swing, smooth turning, able to perform tiptoe, and heel walking without difficulty. Tandem gait is unsteady  DIAGNOSTIC DATA (LABS, IMAGING, TESTING) - I reviewed patient records, labs, notes, testing and imaging myself where available.  Lab Results    Component Value Date   WBC 9.3 06/22/2013   HGB 13.0 06/22/2013   HCT 39.9 06/22/2013   MCV 90 06/22/2013   PLT 287 06/22/2013      Component Value Date/Time   NA 141 03/08/2013 0930   K 4.5 03/08/2013 0930   CL 109 03/08/2013 0930   CO2 22 03/08/2013 0918   GLUCOSE 266* 03/08/2013 0930   BUN 6 03/08/2013 0930   CREATININE 0.60 03/08/2013 0930   CALCIUM 9.5 03/08/2013 0918   PROT 7.9 03/08/2013 0918   ALBUMIN 3.5 03/08/2013 0918   AST 66* 03/08/2013 0918   ALT 75* 03/08/2013 0918   ALKPHOS 143* 03/08/2013 0918   BILITOT 0.3 03/08/2013 0918   GFRNONAA >90 03/08/2013 0918   GFRAA >90 03/08/2013 0918     Lab Results  Component Value Date   VITAMINB12 347 06/22/2013   Lab Results  Component Value Date   TSH 2.687 02/28/2013      ASSESSMENT AND PLAN  47 y.o. year old female  has a past medical history of Asthma; Allergic rhinitis, cause unspecified; Bipolar 1 disorder; Morbid obesity; Thyroid nodule; Mental disorder; Beta thalassemia minor (Pt has a family hx. Pt has never been tested. ); Anxiety; OSA (obstructive sleep apnea); Complication of anesthesia (2004); Difficult intravenous access; Nasal bone fracture (age 63); Iron deficiency anemia, unspecified (02/24/2013); and Pernicious anemia (02/24/2013). here with follow up for headaches have improved with B12 and IV  iron infusions.   Continue current meds No follow up planned Nilda Riggs, [facility], [facility], APRN  [facility] [address], [address] [place], [place] [address] [phone]

## 2013-07-10 NOTE — Patient Instructions (Addendum)
Continue current meds No follow up planned

## 2013-07-20 ENCOUNTER — Ambulatory Visit (HOSPITAL_BASED_OUTPATIENT_CLINIC_OR_DEPARTMENT_OTHER): Payer: Medicaid Other | Admitting: Lab

## 2013-07-20 ENCOUNTER — Ambulatory Visit (HOSPITAL_BASED_OUTPATIENT_CLINIC_OR_DEPARTMENT_OTHER): Payer: Medicaid Other

## 2013-07-20 VITALS — BP 111/70 | HR 65 | Temp 97.9°F | Resp 18

## 2013-07-20 DIAGNOSIS — D649 Anemia, unspecified: Secondary | ICD-10-CM

## 2013-07-20 DIAGNOSIS — D510 Vitamin B12 deficiency anemia due to intrinsic factor deficiency: Secondary | ICD-10-CM

## 2013-07-20 LAB — CBC WITH DIFFERENTIAL (CANCER CENTER ONLY)
BASO%: 0.2 % (ref 0.0–2.0)
EOS%: 1.2 % (ref 0.0–7.0)
Eosinophils Absolute: 0.1 10*3/uL (ref 0.0–0.5)
HGB: 13 g/dL (ref 11.6–15.9)
LYMPH#: 0.9 10*3/uL (ref 0.9–3.3)
LYMPH%: 9.9 % — ABNORMAL LOW (ref 14.0–48.0)
MCHC: 32.3 g/dL (ref 32.0–36.0)
MCV: 93 fL (ref 81–101)
MONO%: 5.4 % (ref 0.0–13.0)
NEUT#: 7.9 10*3/uL — ABNORMAL HIGH (ref 1.5–6.5)
Platelets: 259 10*3/uL (ref 145–400)
RBC: 4.35 10*6/uL (ref 3.70–5.32)
RDW: 14.6 % (ref 11.1–15.7)

## 2013-07-20 MED ORDER — CYANOCOBALAMIN 1000 MCG/ML IJ SOLN
INTRAMUSCULAR | Status: AC
  Filled 2013-07-20: qty 1

## 2013-07-20 MED ORDER — CYANOCOBALAMIN 1000 MCG/ML IJ SOLN
1000.0000 ug | Freq: Once | INTRAMUSCULAR | Status: AC
  Administered 2013-07-20: 1000 ug via INTRAMUSCULAR

## 2013-07-20 NOTE — Patient Instructions (Signed)
Cyanocobalamin, Vitamin B12 injection Qu es este medicamento? La CIANOCOBALAMINA es una forma de vitamina B12 artificial. La vitamina B12 se utiliza para el desarrollo de clulas sanguneas, clulas nerviosas y protenas saludables en el cuerpo. Tambin ayuda con el metabolizacin normal de grasas y carbohidratos. Este medicamento se utiliza para tratar los pacientes que no pueden absorber la vitamina B12. Este medicamento puede ser utilizado para otros usos; si tiene alguna pregunta consulte con su proveedor de atencin mdica o con su farmacutico. MARCAS COMERCIALES DISPONIBLES: Cyomin, LA-12 , Nutri-Twelve , Primabalt Qu le debo informar a mi profesional de la salud antes de tomar este medicamento? Necesita saber si usted presenta alguno de los siguientes problemas o situaciones: -enfermedad renal -enfermedad de Leber -anemia megaloblstica -una reaccin alrgica o inusual a la cianocobalamina, al cobalto, a otros medicamentos, alimentos, colorantes o conservantes -si est embarazada o buscando quedar embarazada -si est amamantando a un beb Cmo debo utilizar este medicamento? Este medicamento se administra mediante inyeccin por va intramuscular o por va subcutnea profunda. Por lo general, lo administra un profesional de la salud en un hospital o en un entorno clnico. Sin embargo, su mdico puede ensearle cmo administrarse sus propias inyecciones. Siga todas las instrucciones. Hable con su pediatra para informarse acerca del uso de este medicamento en nios. Puede requerir atencin especial. Sobredosis: Pngase en contacto inmediatamente con un centro toxicolgico o una sala de urgencia si usted cree que haya tomado demasiado medicamento. ATENCIN: Este medicamento es solo para usted. No comparta este medicamento con nadie. Qu sucede si me olvido de una dosis? Si recibe sus dosis en un entorno clnico o una oficina de mdico, comunquese para programar otra cita. Si se  administra las inyecciones usted mismo y olvida una dosis, sela lo antes posible. Si es casi la hora de la prxima dosis, use slo esa dosis. No use dosis adicionales o dobles. Qu puede interactuar con este medicamento? -colchicina -consumir mucho alcohol Puede ser que esta lista no menciona todas las posibles interacciones. Informe a su profesional de la salud de todos los productos a base de hierbas, medicamentos de venta libre o suplementos nutritivos que est tomando. Si usted fuma, consume bebidas alcohlicas o si utiliza drogas ilegales, indqueselo tambin a su profesional de la salud. Algunas sustancias pueden interactuar con su medicamento. A qu debo estar atento al usar este medicamento? Visite a su mdico o a su profesional de la salud peridicamente. Tal vez necesitar realizarse anlisis de sangre mientras reciba este medicamento. Puede ser necesario seguir una dieta. Consulte a su mdico acerca de esto. Para obtener los mejores resultados, limite su consumo de alcohol y evite fumar. Qu efectos secundarios puedo tener al utilizar este medicamento? Efectos secundarios que debe informar a su mdico o a su profesional de la salud tan pronto como sea posible: -reacciones alrgicas como erupcin cutnea, picazn o urticarias, hinchazn de la cara, labios o lengua -color azulado de la piel -dolor, opresin en el pecho -dificultad al respirar, sibilancias -mareos -rea enrojecido, hinchazn doloroso de la piel Efectos secundarios que, por lo general, no requieren atencin mdica (debe informarlos a su mdico o a su profesional de la salud si persisten o si son molestos): -diarrea -dolor de cabeza Puede ser que esta lista no menciona todos los posibles efectos secundarios. Comunquese a su mdico por asesoramiento mdico sobre los efectos secundarios. Usted puede informar los efectos secundarios a la FDA por telfono al 1-800-FDA-1088. Dnde debo guardar mi medicina? Mantngala  fuera del alcance de los nios.   Gurdela a temperatura ambiente, entre 15 y 30 grados C (59 y 85 grados F). Protjala de la luz. Deseche todo el medicamento que no haya utilizado, despus de la fecha de vencimiento. ATENCIN: Este folleto es un resumen. Puede ser que no cubra toda la posible informacin. Si usted tiene preguntas acerca de esta medicina, consulte con su mdico, su farmacutico o su profesional de la salud.  2014, Elsevier/Gold Standard. (2009-01-11 15:23:45)  

## 2013-08-19 ENCOUNTER — Ambulatory Visit (HOSPITAL_BASED_OUTPATIENT_CLINIC_OR_DEPARTMENT_OTHER): Payer: Medicaid Other | Admitting: Hematology & Oncology

## 2013-08-19 ENCOUNTER — Other Ambulatory Visit (HOSPITAL_BASED_OUTPATIENT_CLINIC_OR_DEPARTMENT_OTHER): Payer: Medicaid Other | Admitting: Lab

## 2013-08-19 ENCOUNTER — Encounter: Payer: Self-pay | Admitting: Hematology & Oncology

## 2013-08-19 ENCOUNTER — Ambulatory Visit (HOSPITAL_BASED_OUTPATIENT_CLINIC_OR_DEPARTMENT_OTHER): Payer: Medicaid Other

## 2013-08-19 VITALS — BP 110/57 | HR 81 | Temp 98.2°F | Resp 16 | Ht 69.0 in | Wt 364.0 lb

## 2013-08-19 DIAGNOSIS — D649 Anemia, unspecified: Secondary | ICD-10-CM

## 2013-08-19 DIAGNOSIS — D510 Vitamin B12 deficiency anemia due to intrinsic factor deficiency: Secondary | ICD-10-CM

## 2013-08-19 DIAGNOSIS — D509 Iron deficiency anemia, unspecified: Secondary | ICD-10-CM

## 2013-08-19 LAB — CHCC SATELLITE - SMEAR

## 2013-08-19 LAB — FERRITIN CHCC: Ferritin: 297 ng/ml — ABNORMAL HIGH (ref 9–269)

## 2013-08-19 LAB — CBC WITH DIFFERENTIAL (CANCER CENTER ONLY)
BASO#: 0 10*3/uL (ref 0.0–0.2)
BASO%: 0.3 % (ref 0.0–2.0)
EOS%: 1.2 % (ref 0.0–7.0)
Eosinophils Absolute: 0.1 10*3/uL (ref 0.0–0.5)
HEMATOCRIT: 41.4 % (ref 34.8–46.6)
HGB: 13.3 g/dL (ref 11.6–15.9)
LYMPH#: 0.9 10*3/uL (ref 0.9–3.3)
LYMPH%: 12.2 % — ABNORMAL LOW (ref 14.0–48.0)
MCH: 30.2 pg (ref 26.0–34.0)
MCHC: 32.1 g/dL (ref 32.0–36.0)
MCV: 94 fL (ref 81–101)
MONO#: 0.4 10*3/uL (ref 0.1–0.9)
MONO%: 5.4 % (ref 0.0–13.0)
NEUT#: 6.3 10*3/uL (ref 1.5–6.5)
NEUT%: 80.9 % — AB (ref 39.6–80.0)
PLATELETS: 261 10*3/uL (ref 145–400)
RBC: 4.4 10*6/uL (ref 3.70–5.32)
RDW: 14.5 % (ref 11.1–15.7)
WBC: 7.7 10*3/uL (ref 3.9–10.0)

## 2013-08-19 LAB — RETICULOCYTES (CHCC)
ABS Retic: 113.6 10*3/uL (ref 19.0–186.0)
RBC.: 4.37 MIL/uL (ref 3.87–5.11)
Retic Ct Pct: 2.6 % — ABNORMAL HIGH (ref 0.4–2.3)

## 2013-08-19 LAB — IRON AND TIBC CHCC
%SAT: 26 % (ref 21–57)
IRON: 64 ug/dL (ref 41–142)
TIBC: 249 ug/dL (ref 236–444)
UIBC: 185 ug/dL (ref 120–384)

## 2013-08-19 LAB — VITAMIN B12: Vitamin B-12: 341 pg/mL (ref 211–911)

## 2013-08-19 MED ORDER — CYANOCOBALAMIN 1000 MCG/ML IJ SOLN
INTRAMUSCULAR | Status: AC
  Filled 2013-08-19: qty 1

## 2013-08-19 MED ORDER — CYANOCOBALAMIN 1000 MCG/ML IJ SOLN
1000.0000 ug | Freq: Once | INTRAMUSCULAR | Status: AC
  Administered 2013-08-19: 1000 ug via INTRAMUSCULAR

## 2013-08-19 NOTE — Patient Instructions (Signed)
Cyanocobalamin, Vitamin B12 injection °What is this medicine? °CYANOCOBALAMIN (sye an oh koe BAL a min) is a man made form of vitamin B12. Vitamin B12 is used in the growth of healthy blood cells, nerve cells, and proteins in the body. It also helps with the metabolism of fats and carbohydrates. This medicine is used to treat people who can not absorb vitamin B12. °This medicine may be used for other purposes; ask your health care provider or pharmacist if you have questions. °COMMON BRAND NAME(S): Cyomin, LA-12 , Nutri-Twelve , Primabalt °What should I tell my health care provider before I take this medicine? °They need to know if you have any of these conditions: °-kidney disease °-Leber's disease °-megaloblastic anemia °-an unusual or allergic reaction to cyanocobalamin, cobalt, other medicines, foods, dyes, or preservatives °-pregnant or trying to get pregnant °-breast-feeding °How should I use this medicine? °This medicine is injected into a muscle or deeply under the skin. It is usually given by a health care professional in a clinic or doctor's office. However, your doctor may teach you how to inject yourself. Follow all instructions. °Talk to your pediatrician regarding the use of this medicine in children. Special care may be needed. °Overdosage: If you think you have taken too much of this medicine contact a poison control center or emergency room at once. °NOTE: This medicine is only for you. Do not share this medicine with others. °What if I miss a dose? °If you are given your dose at a clinic or doctor's office, call to reschedule your appointment. If you give your own injections and you miss a dose, take it as soon as you can. If it is almost time for your next dose, take only that dose. Do not take double or extra doses. °What may interact with this medicine? °-colchicine °-heavy alcohol intake °This list may not describe all possible interactions. Give your health care provider a list of all the  medicines, herbs, non-prescription drugs, or dietary supplements you use. Also tell them if you smoke, drink alcohol, or use illegal drugs. Some items may interact with your medicine. °What should I watch for while using this medicine? °Visit your doctor or health care professional regularly. You may need blood work done while you are taking this medicine. °You may need to follow a special diet. Talk to your doctor. Limit your alcohol intake and avoid smoking to get the best benefit. °What side effects may I notice from receiving this medicine? °Side effects that you should report to your doctor or health care professional as soon as possible: °-allergic reactions like skin rash, itching or hives, swelling of the face, lips, or tongue °-blue tint to skin °-chest tightness, pain °-difficulty breathing, wheezing °-dizziness °-red, swollen painful area on the leg °Side effects that usually do not require medical attention (report to your doctor or health care professional if they continue or are bothersome): °-diarrhea °-headache °This list may not describe all possible side effects. Call your doctor for medical advice about side effects. You may report side effects to FDA at 1-800-FDA-1088. °Where should I keep my medicine? °Keep out of the reach of children. °Store at room temperature between 15 and 30 degrees C (59 and 85 degrees F). Protect from light. Throw away any unused medicine after the expiration date. °NOTE: This sheet is a summary. It may not cover all possible information. If you have questions about this medicine, talk to your doctor, pharmacist, or health care provider. °© 2014, Elsevier/Gold Standard. (2007-10-27   22:10:20) ° °

## 2013-08-19 NOTE — Progress Notes (Signed)
This office note has been dictated.

## 2013-08-20 ENCOUNTER — Telehealth: Payer: Self-pay | Admitting: Nurse Practitioner

## 2013-08-20 NOTE — Telephone Encounter (Addendum)
Message copied by Glee ArvinPICKENPACK-COUSAR, Willma Obando N on Thu Aug 20, 2013  2:36 PM ------      Message from: Arlan OrganENNEVER, PETER R      Created: Wed Aug 19, 2013  9:25 PM       Call - iron is ok!!  Cindee LamePete ------Pt verbalized understanding and appreciation.

## 2013-08-20 NOTE — Progress Notes (Signed)
CC:   Windle GuardWilson Elkins, M.D.  DIAGNOSES: 1. Iron-deficiency anemia. 2. Vitamin B12 deficiency.  CURRENT THERAPY: 1. IV iron as indicated. 2. Vitamin B12 1 mg IM q.month.  INTERIM HISTORY:  [name] comes in for followup.  She is doing quite well.  We last saw her back in November.  At that point in time, her iron studies showed a ferritin of 136 with an iron saturation of 21%.  We have checked her for parietal cell antibodies and intrinsic factor antibodies.  These were both negative.  Her last vitamin B12 level was 347 in November.  She is a little bit of fatigued.  She thinks that this maybe little bit of depression.  Of note, her last Duncan DullFeraheme was given back in December.  She has had no fevers, sweats, or chills.  She has had no further issues with asthma attacks.  She has had no change in bowel or bladder habits.  There has been no leg swelling.  She has had no rash.  There has been no change in medications.  PHYSICAL EXAMINATION:  General:  This is a morbidly obese white female in no obvious distress.  Vital Signs:  Her temperature of 98.2, pulse 81, respiratory rate 16, blood pressure 110/57, weight is 364 pounds. Head and Neck:  Normocephalic, atraumatic skull.  There are no ocular or oral lesions.  There are no palpable cervical or supraclavicular lymph nodes.  Lungs:  Clear bilaterally.  Cardiac:  Regular rate and rhythm with a normal S1 and S2.  There are no murmurs, rubs, or bruits. Abdomen:  Soft.  She has good bowel sounds.  There is no fluid wave. There is no palpable abdominal mass.  There is no palpable hepatosplenomegaly.  Back:  No tenderness over the spine, ribs, or hips. Extremities:  No clubbing, cyanosis, or edema.  Neurological:  No focal neurological deficits.  LABORATORY STUDIES:  White cell count is 7.7, hemoglobin 13.3, hematocrit 41.4, platelet count 261.  IMPRESSION:  [name] is a very charming 48 year old white female.  She  has iron-deficiency anemia.  She had a vitamin B12 deficiency.  We are treating both.  She gets the B12 monthly.  Hopefully, her iron levels are okay that we do not need to give iron for quite a while.  We will go ahead and plan to have her come back monthly for the B12.  I will plan to see her back myself probably in about 3 months.    ______________________________ Josph MachoPeter R Jaimya Feliciano, M.D. PRE/MEDQ  D:  08/19/2013  T:  08/20/2013  Job:  [id number]

## 2013-09-16 ENCOUNTER — Other Ambulatory Visit: Payer: Medicaid Other | Admitting: Lab

## 2013-09-16 ENCOUNTER — Ambulatory Visit (HOSPITAL_BASED_OUTPATIENT_CLINIC_OR_DEPARTMENT_OTHER): Payer: Medicaid Other

## 2013-09-16 VITALS — BP 126/74 | HR 77 | Temp 98.5°F | Resp 20

## 2013-09-16 DIAGNOSIS — D649 Anemia, unspecified: Secondary | ICD-10-CM

## 2013-09-16 DIAGNOSIS — D510 Vitamin B12 deficiency anemia due to intrinsic factor deficiency: Secondary | ICD-10-CM

## 2013-09-16 MED ORDER — CYANOCOBALAMIN 1000 MCG/ML IJ SOLN
INTRAMUSCULAR | Status: AC
  Filled 2013-09-16: qty 1

## 2013-09-16 MED ORDER — CYANOCOBALAMIN 1000 MCG/ML IJ SOLN
1000.0000 ug | Freq: Once | INTRAMUSCULAR | Status: AC
  Administered 2013-09-16: 1000 ug via INTRAMUSCULAR

## 2013-09-16 NOTE — Patient Instructions (Signed)
Cyanocobalamin, Vitamin B12 injection °What is this medicine? °CYANOCOBALAMIN (sye an oh koe BAL a min) is a man made form of vitamin B12. Vitamin B12 is used in the growth of healthy blood cells, nerve cells, and proteins in the body. It also helps with the metabolism of fats and carbohydrates. This medicine is used to treat people who can not absorb vitamin B12. °This medicine may be used for other purposes; ask your health care provider or pharmacist if you have questions. °COMMON BRAND NAME(S): Cyomin, LA-12 , Nutri-Twelve , Primabalt °What should I tell my health care provider before I take this medicine? °They need to know if you have any of these conditions: °-kidney disease °-Leber's disease °-megaloblastic anemia °-an unusual or allergic reaction to cyanocobalamin, cobalt, other medicines, foods, dyes, or preservatives °-pregnant or trying to get pregnant °-breast-feeding °How should I use this medicine? °This medicine is injected into a muscle or deeply under the skin. It is usually given by a health care professional in a clinic or doctor's office. However, your doctor may teach you how to inject yourself. Follow all instructions. °Talk to your pediatrician regarding the use of this medicine in children. Special care may be needed. °Overdosage: If you think you have taken too much of this medicine contact a poison control center or emergency room at once. °NOTE: This medicine is only for you. Do not share this medicine with others. °What if I miss a dose? °If you are given your dose at a clinic or doctor's office, call to reschedule your appointment. If you give your own injections and you miss a dose, take it as soon as you can. If it is almost time for your next dose, take only that dose. Do not take double or extra doses. °What may interact with this medicine? °-colchicine °-heavy alcohol intake °This list may not describe all possible interactions. Give your health care provider a list of all the  medicines, herbs, non-prescription drugs, or dietary supplements you use. Also tell them if you smoke, drink alcohol, or use illegal drugs. Some items may interact with your medicine. °What should I watch for while using this medicine? °Visit your doctor or health care professional regularly. You may need blood work done while you are taking this medicine. °You may need to follow a special diet. Talk to your doctor. Limit your alcohol intake and avoid smoking to get the best benefit. °What side effects may I notice from receiving this medicine? °Side effects that you should report to your doctor or health care professional as soon as possible: °-allergic reactions like skin rash, itching or hives, swelling of the face, lips, or tongue °-blue tint to skin °-chest tightness, pain °-difficulty breathing, wheezing °-dizziness °-red, swollen painful area on the leg °Side effects that usually do not require medical attention (report to your doctor or health care professional if they continue or are bothersome): °-diarrhea °-headache °This list may not describe all possible side effects. Call your doctor for medical advice about side effects. You may report side effects to FDA at 1-800-FDA-1088. °Where should I keep my medicine? °Keep out of the reach of children. °Store at room temperature between 15 and 30 degrees C (59 and 85 degrees F). Protect from light. Throw away any unused medicine after the expiration date. °NOTE: This sheet is a summary. It may not cover all possible information. If you have questions about this medicine, talk to your doctor, pharmacist, or health care provider. °© 2014, Elsevier/Gold Standard. (2007-10-27   22:10:20) ° °

## 2013-10-14 ENCOUNTER — Ambulatory Visit: Payer: Self-pay

## 2013-10-14 ENCOUNTER — Other Ambulatory Visit: Payer: Self-pay | Admitting: Lab

## 2013-10-15 ENCOUNTER — Other Ambulatory Visit (HOSPITAL_BASED_OUTPATIENT_CLINIC_OR_DEPARTMENT_OTHER): Payer: Medicaid Other | Admitting: Lab

## 2013-10-15 ENCOUNTER — Ambulatory Visit (HOSPITAL_BASED_OUTPATIENT_CLINIC_OR_DEPARTMENT_OTHER): Payer: Medicaid Other

## 2013-10-15 VITALS — BP 117/74 | HR 74 | Temp 97.5°F | Resp 18

## 2013-10-15 DIAGNOSIS — E538 Deficiency of other specified B group vitamins: Secondary | ICD-10-CM

## 2013-10-15 DIAGNOSIS — D649 Anemia, unspecified: Secondary | ICD-10-CM

## 2013-10-15 DIAGNOSIS — D509 Iron deficiency anemia, unspecified: Secondary | ICD-10-CM

## 2013-10-15 LAB — CBC WITH DIFFERENTIAL (CANCER CENTER ONLY)
BASO#: 0 10*3/uL (ref 0.0–0.2)
BASO%: 0.3 % (ref 0.0–2.0)
EOS ABS: 0.1 10*3/uL (ref 0.0–0.5)
EOS%: 1.2 % (ref 0.0–7.0)
HCT: 39 % (ref 34.8–46.6)
HEMOGLOBIN: 12.8 g/dL (ref 11.6–15.9)
LYMPH#: 1.1 10*3/uL (ref 0.9–3.3)
LYMPH%: 11.8 % — ABNORMAL LOW (ref 14.0–48.0)
MCH: 31.6 pg (ref 26.0–34.0)
MCHC: 32.8 g/dL (ref 32.0–36.0)
MCV: 96 fL (ref 81–101)
MONO#: 0.5 10*3/uL (ref 0.1–0.9)
MONO%: 5.2 % (ref 0.0–13.0)
NEUT%: 81.5 % — ABNORMAL HIGH (ref 39.6–80.0)
NEUTROS ABS: 7.4 10*3/uL — AB (ref 1.5–6.5)
PLATELETS: 279 10*3/uL (ref 145–400)
RBC: 4.05 10*6/uL (ref 3.70–5.32)
RDW: 14.3 % (ref 11.1–15.7)
WBC: 9.1 10*3/uL (ref 3.9–10.0)

## 2013-10-15 LAB — CHCC SATELLITE - SMEAR

## 2013-10-15 LAB — RETICULOCYTES (CHCC)
ABS Retic: 121.5 10*3/uL (ref 19.0–186.0)
RBC.: 4.05 MIL/uL (ref 3.87–5.11)
Retic Ct Pct: 3 % — ABNORMAL HIGH (ref 0.4–2.3)

## 2013-10-15 MED ORDER — CYANOCOBALAMIN 1000 MCG/ML IJ SOLN
INTRAMUSCULAR | Status: AC
  Filled 2013-10-15: qty 1

## 2013-10-15 MED ORDER — CYANOCOBALAMIN 1000 MCG/ML IJ SOLN
1000.0000 ug | Freq: Once | INTRAMUSCULAR | Status: AC
  Administered 2013-10-15: 1000 ug via INTRAMUSCULAR

## 2013-10-15 NOTE — Patient Instructions (Signed)
Cyanocobalamin, Vitamin B12 injection °What is this medicine? °CYANOCOBALAMIN (sye an oh koe BAL a min) is a man made form of vitamin B12. Vitamin B12 is used in the growth of healthy blood cells, nerve cells, and proteins in the body. It also helps with the metabolism of fats and carbohydrates. This medicine is used to treat people who can not absorb vitamin B12. °This medicine may be used for other purposes; ask your health care provider or pharmacist if you have questions. °COMMON BRAND NAME(S): Cyomin, LA-12 , Nutri-Twelve , Primabalt °What should I tell my health care provider before I take this medicine? °They need to know if you have any of these conditions: °-kidney disease °-Leber's disease °-megaloblastic anemia °-an unusual or allergic reaction to cyanocobalamin, cobalt, other medicines, foods, dyes, or preservatives °-pregnant or trying to get pregnant °-breast-feeding °How should I use this medicine? °This medicine is injected into a muscle or deeply under the skin. It is usually given by a health care professional in a clinic or doctor's office. However, your doctor may teach you how to inject yourself. Follow all instructions. °Talk to your pediatrician regarding the use of this medicine in children. Special care may be needed. °Overdosage: If you think you have taken too much of this medicine contact a poison control center or emergency room at once. °NOTE: This medicine is only for you. Do not share this medicine with others. °What if I miss a dose? °If you are given your dose at a clinic or doctor's office, call to reschedule your appointment. If you give your own injections and you miss a dose, take it as soon as you can. If it is almost time for your next dose, take only that dose. Do not take double or extra doses. °What may interact with this medicine? °-colchicine °-heavy alcohol intake °This list may not describe all possible interactions. Give your health care provider a list of all the  medicines, herbs, non-prescription drugs, or dietary supplements you use. Also tell them if you smoke, drink alcohol, or use illegal drugs. Some items may interact with your medicine. °What should I watch for while using this medicine? °Visit your doctor or health care professional regularly. You may need blood work done while you are taking this medicine. °You may need to follow a special diet. Talk to your doctor. Limit your alcohol intake and avoid smoking to get the best benefit. °What side effects may I notice from receiving this medicine? °Side effects that you should report to your doctor or health care professional as soon as possible: °-allergic reactions like skin rash, itching or hives, swelling of the face, lips, or tongue °-blue tint to skin °-chest tightness, pain °-difficulty breathing, wheezing °-dizziness °-red, swollen painful area on the leg °Side effects that usually do not require medical attention (report to your doctor or health care professional if they continue or are bothersome): °-diarrhea °-headache °This list may not describe all possible side effects. Call your doctor for medical advice about side effects. You may report side effects to FDA at 1-800-FDA-1088. °Where should I keep my medicine? °Keep out of the reach of children. °Store at room temperature between 15 and 30 degrees C (59 and 85 degrees F). Protect from light. Throw away any unused medicine after the expiration date. °NOTE: This sheet is a summary. It may not cover all possible information. If you have questions about this medicine, talk to your doctor, pharmacist, or health care provider. °© 2014, Elsevier/Gold Standard. (2007-10-27   22:10:20) ° °

## 2013-10-16 LAB — IRON AND TIBC CHCC
%SAT: 33 % (ref 21–57)
IRON: 83 ug/dL (ref 41–142)
TIBC: 250 ug/dL (ref 236–444)
UIBC: 168 ug/dL (ref 120–384)

## 2013-10-16 LAB — FERRITIN CHCC: Ferritin: 154 ng/ml (ref 9–269)

## 2013-10-20 ENCOUNTER — Telehealth: Payer: Self-pay | Admitting: *Deleted

## 2013-10-20 NOTE — Telephone Encounter (Addendum)
Message copied by Burnett CorrenteUMLEY, Lesa Vandall L on Tue Oct 20, 2013  2:47 PM ------      Message from: Arlan OrganENNEVER, PETER R      Created: Fri Oct 16, 2013  6:00 PM       Please call and tell her that her iron is okay. Thanks. Pete ------Talked with patient and informed her that her iron is okay.

## 2013-11-10 ENCOUNTER — Telehealth: Payer: Self-pay | Admitting: Hematology & Oncology

## 2013-11-10 NOTE — Telephone Encounter (Signed)
Per Nurse Mgr Lowella BandyNikki C - Move Dr. Bea LauraE appt ONLY 4/16 to next available

## 2013-11-12 ENCOUNTER — Other Ambulatory Visit (HOSPITAL_BASED_OUTPATIENT_CLINIC_OR_DEPARTMENT_OTHER): Payer: Medicaid Other | Admitting: Lab

## 2013-11-12 ENCOUNTER — Ambulatory Visit: Payer: Self-pay | Admitting: Hematology & Oncology

## 2013-11-12 ENCOUNTER — Ambulatory Visit (HOSPITAL_BASED_OUTPATIENT_CLINIC_OR_DEPARTMENT_OTHER): Payer: Medicaid Other

## 2013-11-12 VITALS — BP 119/75 | HR 69 | Temp 98.2°F | Resp 18

## 2013-11-12 DIAGNOSIS — D649 Anemia, unspecified: Secondary | ICD-10-CM

## 2013-11-12 DIAGNOSIS — D509 Iron deficiency anemia, unspecified: Secondary | ICD-10-CM

## 2013-11-12 DIAGNOSIS — D510 Vitamin B12 deficiency anemia due to intrinsic factor deficiency: Secondary | ICD-10-CM

## 2013-11-12 LAB — CBC WITH DIFFERENTIAL (CANCER CENTER ONLY)
BASO#: 0 10*3/uL (ref 0.0–0.2)
BASO%: 0.2 % (ref 0.0–2.0)
EOS%: 1.9 % (ref 0.0–7.0)
Eosinophils Absolute: 0.2 10*3/uL (ref 0.0–0.5)
HCT: 38.6 % (ref 34.8–46.6)
HGB: 13 g/dL (ref 11.6–15.9)
LYMPH#: 0.7 10*3/uL — ABNORMAL LOW (ref 0.9–3.3)
LYMPH%: 8.3 % — AB (ref 14.0–48.0)
MCH: 32.3 pg (ref 26.0–34.0)
MCHC: 33.7 g/dL (ref 32.0–36.0)
MCV: 96 fL (ref 81–101)
MONO#: 0.4 10*3/uL (ref 0.1–0.9)
MONO%: 5 % (ref 0.0–13.0)
NEUT#: 7.2 10*3/uL — ABNORMAL HIGH (ref 1.5–6.5)
NEUT%: 84.6 % — ABNORMAL HIGH (ref 39.6–80.0)
PLATELETS: 260 10*3/uL (ref 145–400)
RBC: 4.02 10*6/uL (ref 3.70–5.32)
RDW: 13.4 % (ref 11.1–15.7)
WBC: 8.5 10*3/uL (ref 3.9–10.0)

## 2013-11-12 LAB — CHCC SATELLITE - SMEAR

## 2013-11-12 LAB — RETICULOCYTES (CHCC)
ABS RETIC: 113.7 10*3/uL (ref 19.0–186.0)
RBC.: 4.06 MIL/uL (ref 3.87–5.11)
Retic Ct Pct: 2.8 % — ABNORMAL HIGH (ref 0.4–2.3)

## 2013-11-12 MED ORDER — CYANOCOBALAMIN 1000 MCG/ML IJ SOLN
INTRAMUSCULAR | Status: AC
  Filled 2013-11-12: qty 1

## 2013-11-12 MED ORDER — CYANOCOBALAMIN 1000 MCG/ML IJ SOLN
1000.0000 ug | Freq: Once | INTRAMUSCULAR | Status: AC
  Administered 2013-11-12: 1000 ug via INTRAMUSCULAR

## 2013-11-12 NOTE — Patient Instructions (Signed)
Cyanocobalamin, Vitamin B12 injection Qu es este medicamento? La CIANOCOBALAMINA es una forma de vitamina B12 artificial. La vitamina B12 se utiliza para el desarrollo de clulas sanguneas, clulas nerviosas y protenas saludables en el cuerpo. Tambin ayuda con el metabolizacin normal de grasas y carbohidratos. Este medicamento se utiliza para tratar los pacientes que no pueden absorber la vitamina B12. Este medicamento puede ser utilizado para otros usos; si tiene alguna pregunta consulte con su proveedor de atencin mdica o con su farmacutico. MARCAS COMERCIALES DISPONIBLES: Cyomin, LA-12 , Nutri-Twelve , Primabalt Qu le debo informar a mi profesional de la salud antes de tomar este medicamento? Necesita saber si usted presenta alguno de los siguientes problemas o situaciones: -enfermedad renal -enfermedad de Leber -anemia megaloblstica -una reaccin alrgica o inusual a la cianocobalamina, al cobalto, a otros medicamentos, alimentos, colorantes o conservantes -si est embarazada o buscando quedar embarazada -si est amamantando a un beb Cmo debo utilizar este medicamento? Este medicamento se administra mediante inyeccin por va intramuscular o por va subcutnea profunda. Por lo general, lo administra un profesional de la salud en un hospital o en un entorno clnico. Sin embargo, su mdico puede ensearle cmo administrarse sus propias inyecciones. Siga todas las instrucciones. Hable con su pediatra para informarse acerca del uso de este medicamento en nios. Puede requerir atencin especial. Sobredosis: Pngase en contacto inmediatamente con un centro toxicolgico o una sala de urgencia si usted cree que haya tomado demasiado medicamento. ATENCIN: Este medicamento es solo para usted. No comparta este medicamento con nadie. Qu sucede si me olvido de una dosis? Si recibe sus dosis en un entorno clnico o una oficina de mdico, comunquese para programar otra cita. Si se  administra las inyecciones usted mismo y olvida una dosis, sela lo antes posible. Si es casi la hora de la prxima dosis, use slo esa dosis. No use dosis adicionales o dobles. Qu puede interactuar con este medicamento? -colchicina -consumir mucho alcohol Puede ser que esta lista no menciona todas las posibles interacciones. Informe a su profesional de la salud de todos los productos a base de hierbas, medicamentos de venta libre o suplementos nutritivos que est tomando. Si usted fuma, consume bebidas alcohlicas o si utiliza drogas ilegales, indqueselo tambin a su profesional de la salud. Algunas sustancias pueden interactuar con su medicamento. A qu debo estar atento al usar este medicamento? Visite a su mdico o a su profesional de la salud peridicamente. Tal vez necesitar realizarse anlisis de sangre mientras reciba este medicamento. Puede ser necesario seguir una dieta. Consulte a su mdico acerca de esto. Para obtener los mejores resultados, limite su consumo de alcohol y evite fumar. Qu efectos secundarios puedo tener al utilizar este medicamento? Efectos secundarios que debe informar a su mdico o a su profesional de la salud tan pronto como sea posible: -reacciones alrgicas como erupcin cutnea, picazn o urticarias, hinchazn de la cara, labios o lengua -color azulado de la piel -dolor, opresin en el pecho -dificultad al respirar, sibilancias -mareos -rea enrojecido, hinchazn doloroso de la piel Efectos secundarios que, por lo general, no requieren atencin mdica (debe informarlos a su mdico o a su profesional de la salud si persisten o si son molestos): -diarrea -dolor de cabeza Puede ser que esta lista no menciona todos los posibles efectos secundarios. Comunquese a su mdico por asesoramiento mdico sobre los efectos secundarios. Usted puede informar los efectos secundarios a la FDA por telfono al 1-800-FDA-1088. Dnde debo guardar mi medicina? Mantngala  fuera del alcance de los nios.   Gurdela a temperatura ambiente, entre 15 y 30 grados C (59 y 85 grados F). Protjala de la luz. Deseche todo el medicamento que no haya utilizado, despus de la fecha de vencimiento. ATENCIN: Este folleto es un resumen. Puede ser que no cubra toda la posible informacin. Si usted tiene preguntas acerca de esta medicina, consulte con su mdico, su farmacutico o su profesional de la salud.  2014, Elsevier/Gold Standard. (2009-01-11 15:23:45)  

## 2013-11-13 LAB — IRON AND TIBC CHCC
%SAT: 17 % — ABNORMAL LOW (ref 21–57)
Iron: 45 ug/dL (ref 41–142)
TIBC: 256 ug/dL (ref 236–444)
UIBC: 212 ug/dL (ref 120–384)

## 2013-11-13 LAB — FERRITIN CHCC: Ferritin: 166 ng/ml (ref 9–269)

## 2013-11-16 ENCOUNTER — Other Ambulatory Visit: Payer: Self-pay | Admitting: *Deleted

## 2013-11-16 ENCOUNTER — Telehealth: Payer: Self-pay | Admitting: Hematology & Oncology

## 2013-11-16 ENCOUNTER — Telehealth: Payer: Self-pay | Admitting: *Deleted

## 2013-11-16 DIAGNOSIS — D509 Iron deficiency anemia, unspecified: Secondary | ICD-10-CM

## 2013-11-16 NOTE — Telephone Encounter (Signed)
Message copied by Anselm JunglingBARTKO, Tyronza Happe ELLEN O on Mon Nov 16, 2013 11:34 AM ------      Message from: Arlan OrganENNEVER, PETER R      Created: Mon Nov 16, 2013  6:36 AM       Call - iron is lower.  Please set up Feraheme 1020mg  x 1 dose. Pete ------

## 2013-11-16 NOTE — Telephone Encounter (Signed)
Pt aware of 4-24 iron

## 2013-11-16 NOTE — Telephone Encounter (Signed)
Left message on patient personal answering machine to call our office back about below message

## 2013-11-20 ENCOUNTER — Ambulatory Visit (HOSPITAL_BASED_OUTPATIENT_CLINIC_OR_DEPARTMENT_OTHER): Payer: Medicaid Other

## 2013-11-20 VITALS — BP 116/75 | HR 97 | Temp 97.7°F | Resp 20

## 2013-11-20 DIAGNOSIS — D509 Iron deficiency anemia, unspecified: Secondary | ICD-10-CM

## 2013-11-20 MED ORDER — SODIUM CHLORIDE 0.9 % IV SOLN
1020.0000 mg | Freq: Once | INTRAVENOUS | Status: AC
  Administered 2013-11-20: 1020 mg via INTRAVENOUS
  Filled 2013-11-20: qty 34

## 2013-11-20 MED ORDER — SODIUM CHLORIDE 0.9 % IV SOLN
Freq: Once | INTRAVENOUS | Status: AC
  Administered 2013-11-20: 13:00:00 via INTRAVENOUS

## 2013-11-20 NOTE — Patient Instructions (Signed)

## 2013-11-26 ENCOUNTER — Telehealth: Payer: Self-pay | Admitting: Hematology & Oncology

## 2013-11-26 NOTE — Telephone Encounter (Signed)
Pt called wanting follow up appointments. I left RN voice mail for orders

## 2013-12-10 ENCOUNTER — Ambulatory Visit (HOSPITAL_BASED_OUTPATIENT_CLINIC_OR_DEPARTMENT_OTHER): Payer: Medicaid Other

## 2013-12-10 VITALS — BP 114/70 | Temp 96.8°F | Resp 20

## 2013-12-10 DIAGNOSIS — D649 Anemia, unspecified: Secondary | ICD-10-CM

## 2013-12-10 DIAGNOSIS — D510 Vitamin B12 deficiency anemia due to intrinsic factor deficiency: Secondary | ICD-10-CM

## 2013-12-10 MED ORDER — CYANOCOBALAMIN 1000 MCG/ML IJ SOLN
INTRAMUSCULAR | Status: AC
  Filled 2013-12-10: qty 1

## 2013-12-10 MED ORDER — CYANOCOBALAMIN 1000 MCG/ML IJ SOLN
1000.0000 ug | Freq: Once | INTRAMUSCULAR | Status: AC
  Administered 2013-12-10: 1000 ug via INTRAMUSCULAR

## 2013-12-10 NOTE — Patient Instructions (Signed)
Vitamin B12 Injections Every person needs vitamin B12. A deficiency develops when the body does not get enough of it. One way to overcome this is by getting B12 shots (injections). A B12 shot puts the vitamin directly into muscle tissue. This avoids any problems your body might have in absorbing it from food or a pill. In some people, the body has trouble using the vitamin correctly. This can cause a B12 deficiency. Not consuming enough of the vitamin can also cause a deficiency. Getting enough vitamin B12 can be hard for elderly people. Sometimes, they do not eat a well-balanced diet. The elderly are also more likely than younger people to have medical conditions or take medications that can lead to a deficiency. WHAT DOES VITAMIN B12 DO? Vitamin B12 does many things to help the body work right:  It helps the body make healthy red blood cells.  It helps maintain nerve cells.  It is involved in the body's process of converting food into energy (metabolism).  It is needed to make the genetic material in all cells (DNA). VITAMIN B12 FOOD SOURCES Most people get plenty of vitamin B12 through the foods they eat. It is present in:  Meat, fish, poultry, and eggs.  Milk and milk products.  It also is added when certain foods are made, including some breads, cereals and yogurts. The food is then called "fortified". CAUSES The most common causes of vitamin B12 deficiency are:  Pernicious anemia. The condition develops when the body cannot make enough healthy red blood cells. This stems from a lack of a protein made in the stomach (intrinsic factor). People without this protein cannot absorb enough vitamin B12 from food.  Malabsorption. This is when the body cannot absorb the vitamin. It can be caused by:  Pernicious anemia.  Surgery to remove part or all of the stomach can lead to malabsorption. Removal of part or all of the small intestine can also cause malabsorption.  Vegetarian diet.  People who are strict about not eating foods from animals could have trouble taking in enough vitamin B12 from diet alone.  Medications. Some medicines have been linked to B12 deficiency, such as Metformin (a drug prescribed for type 2 diabetes). Long-term use of stomach acid suppressants also can keep the vitamin from being absorbed.  Intestinal problems such as inflammatory bowel disease. If there are problems in the digestive tract, vitamin B12 may not be absorbed in good enough amounts. SYMPTOMS People who do not get enough B12 can develop problems. These can include:  Anemia. This is when the body has too few red blood cells. Red blood cells carry oxygen to the rest of the body. Without a healthy supply of red blood cells, people can feel:  Tired (fatigued).  Weak.  Severe anemia can cause:  Shortness of breath.  Dizziness.  Rapid heart rate.  Paleness.  Other Vitamin B12 deficiency symptoms include:  Diarrhea.  Numbness or tingling in the hands or feet.  Loss of appetite.  Confusion.  Sores on the tongue or in the mouth. LET YOUR CAREGIVER KNOW ABOUT:  Any allergies. It is very important to know if you are allergic or sensitive to cobalt. Vitamin B12 contains cobalt.  Any history of kidney disease.  All medications you are taking. Include prescription and over-the-counter medicines, herbs and creams.  Whether you are pregnant or breast-feeding.  If you have Leber's disease, a hereditary eye condition, vitamin B12 could make it worse. RISKS AND COMPLICATIONS Reactions to an injection are   usually temporary. They might include:  Pain at the injection site.  Redness, swelling or tenderness at the site.  Headache, dizziness or weakness.  Nausea, upset stomach or diarrhea.  Numbness or tingling.  Fever.  Joint pain.  Itching or rash. If a reaction does not go away in a short while, talk with your healthcare provider. A change in the way the shots are  given, or where they are given, might need to be made. BEFORE AN INJECTION To decide whether B12 injections are right for you, your healthcare provider will probably:  Ask about your medical history.  Ask questions about your diet.  Ask about symptoms such as:  Have you felt weak?  Do you feel unusually tired?  Do you get dizzy?  Order blood tests. These may include a test to:  Check the level of red cells in your blood.  Measure B12 levels.  Check for the presence of intrinsic factor. VITAMIN B12 INJECTIONS How often you will need a vitamin B12 injection will depend on how severe your deficiency is. This also will affect how long you will need to get them. People with pernicious anemia usually get injections for their entire life. Others might get them for a shorter period. For many people, injections are given daily or weekly for several weeks. Then, once B12 levels are normal, injections are given just once a month. If the cause of the deficiency can be fixed, the injections can be stopped. Talk with your healthcare provider about what you should expect. For an injection:  The injection site will be cleaned with an alcohol swab.  Your healthcare provider will insert a needle directly into a muscle. Most any muscle can be used. Most often, an arm muscle is used. A buttocks muscle can also be used. Many people say shots in that area are less painful.  A small adhesive bandage may be put over the injection site. It usually can be taken off in an hour or less. Injections can be given by your healthcare provider. In some cases, family members give them. Sometimes, people give them to themselves. Talk with your healthcare provider about what would be best for you. If someone other than your healthcare provider will be giving the shots, the person will need to be trained to give them correctly. HOME CARE INSTRUCTIONS   You can remove the adhesive bandage within an hour of getting a  shot.  You should be able to go about your normal activities right away.  Avoid drinking large amounts of alcohol while taking vitamin B12 shots. Alcohol can interfere with the body's use of the vitamin. SEEK MEDICAL CARE IF:   Pain, redness, swelling or tenderness at the injection site does not get better or gets worse.  Headache, dizziness or weakness does not go away.  You develop a fever of more than 100.5 F (38.1 C). SEEK IMMEDIATE MEDICAL CARE IF:   You have chest pain.  You develop shortness of breath.  You have muscle weakness that gets worse.  You develop numbness, weakness or tingling on one side or one area of the body.  You have symptoms of an allergic reaction, such as:  Hives.  Difficulty breathing.  Swelling of the lips, face, tongue or throat.  You develop a fever of more than 102.0 F (38.9 C). MAKE SURE YOU:   Understand these instructions.  Will watch your condition.  Will get help right away if you are not doing well or get worse. Document   Released: 10/12/2008 Document Revised: 10/08/2011 Document Reviewed: 10/12/2008 Exit[employer] Patient Information 2014 [employer].  

## 2014-01-07 ENCOUNTER — Ambulatory Visit (HOSPITAL_BASED_OUTPATIENT_CLINIC_OR_DEPARTMENT_OTHER): Payer: Medicaid Other

## 2014-01-07 VITALS — BP 120/76 | Temp 97.2°F | Resp 18

## 2014-01-07 DIAGNOSIS — D510 Vitamin B12 deficiency anemia due to intrinsic factor deficiency: Secondary | ICD-10-CM

## 2014-01-07 DIAGNOSIS — D649 Anemia, unspecified: Secondary | ICD-10-CM

## 2014-01-07 MED ORDER — CYANOCOBALAMIN 1000 MCG/ML IJ SOLN
1000.0000 ug | Freq: Once | INTRAMUSCULAR | Status: AC
  Administered 2014-01-07: 1000 ug via INTRAMUSCULAR

## 2014-01-07 MED ORDER — SODIUM CHLORIDE 0.9 % IV SOLN: 1020.0000 mg | Freq: Once | INTRAVENOUS | Status: DC

## 2014-01-07 MED ORDER — SODIUM CHLORIDE 0.9 % IV SOLN
INTRAVENOUS | Status: DC
Start: 2014-01-07 — End: 2014-01-07

## 2014-01-07 MED ORDER — CYANOCOBALAMIN 1000 MCG/ML IJ SOLN
INTRAMUSCULAR | Status: AC
  Filled 2014-01-07: qty 1

## 2014-01-07 NOTE — Patient Instructions (Signed)
Cyanocobalamin, Vitamin B12 injection °What is this medicine? °CYANOCOBALAMIN (sye an oh koe BAL a min) is a man made form of vitamin B12. Vitamin B12 is used in the growth of healthy blood cells, nerve cells, and proteins in the body. It also helps with the metabolism of fats and carbohydrates. This medicine is used to treat people who can not absorb vitamin B12. °This medicine may be used for other purposes; ask your health care provider or pharmacist if you have questions. °COMMON BRAND NAME(S): Cyomin, LA-12 , Nutri-Twelve , Primabalt °What should I tell my health care provider before I take this medicine? °They need to know if you have any of these conditions: °-kidney disease °-Leber's disease °-megaloblastic anemia °-an unusual or allergic reaction to cyanocobalamin, cobalt, other medicines, foods, dyes, or preservatives °-pregnant or trying to get pregnant °-breast-feeding °How should I use this medicine? °This medicine is injected into a muscle or deeply under the skin. It is usually given by a health care professional in a clinic or doctor's office. However, your doctor may teach you how to inject yourself. Follow all instructions. °Talk to your pediatrician regarding the use of this medicine in children. Special care may be needed. °Overdosage: If you think you have taken too much of this medicine contact a poison control center or emergency room at once. °NOTE: This medicine is only for you. Do not share this medicine with others. °What if I miss a dose? °If you are given your dose at a clinic or doctor's office, call to reschedule your appointment. If you give your own injections and you miss a dose, take it as soon as you can. If it is almost time for your next dose, take only that dose. Do not take double or extra doses. °What may interact with this medicine? °-colchicine °-heavy alcohol intake °This list may not describe all possible interactions. Give your health care provider a list of all the  medicines, herbs, non-prescription drugs, or dietary supplements you use. Also tell them if you smoke, drink alcohol, or use illegal drugs. Some items may interact with your medicine. °What should I watch for while using this medicine? °Visit your doctor or health care professional regularly. You may need blood work done while you are taking this medicine. °You may need to follow a special diet. Talk to your doctor. Limit your alcohol intake and avoid smoking to get the best benefit. °What side effects may I notice from receiving this medicine? °Side effects that you should report to your doctor or health care professional as soon as possible: °-allergic reactions like skin rash, itching or hives, swelling of the face, lips, or tongue °-blue tint to skin °-chest tightness, pain °-difficulty breathing, wheezing °-dizziness °-red, swollen painful area on the leg °Side effects that usually do not require medical attention (report to your doctor or health care professional if they continue or are bothersome): °-diarrhea °-headache °This list may not describe all possible side effects. Call your doctor for medical advice about side effects. You may report side effects to FDA at 1-800-FDA-1088. °Where should I keep my medicine? °Keep out of the reach of children. °Store at room temperature between 15 and 30 degrees C (59 and 85 degrees F). Protect from light. Throw away any unused medicine after the expiration date. °NOTE: This sheet is a summary. It may not cover all possible information. If you have questions about this medicine, talk to your doctor, pharmacist, or health care provider. °© 2014, Elsevier/Gold Standard. (2007-10-27   22:10:20) ° °

## 2014-02-01 ENCOUNTER — Ambulatory Visit: Payer: Self-pay | Admitting: Hematology & Oncology

## 2014-02-01 ENCOUNTER — Other Ambulatory Visit: Payer: Self-pay | Admitting: Lab

## 2014-02-03 ENCOUNTER — Other Ambulatory Visit: Payer: Self-pay | Admitting: *Deleted

## 2014-02-03 DIAGNOSIS — D509 Iron deficiency anemia, unspecified: Secondary | ICD-10-CM

## 2014-02-04 ENCOUNTER — Ambulatory Visit (HOSPITAL_BASED_OUTPATIENT_CLINIC_OR_DEPARTMENT_OTHER): Payer: Medicaid Other | Admitting: Family

## 2014-02-04 ENCOUNTER — Other Ambulatory Visit (HOSPITAL_BASED_OUTPATIENT_CLINIC_OR_DEPARTMENT_OTHER): Payer: Medicaid Other | Admitting: Lab

## 2014-02-04 ENCOUNTER — Ambulatory Visit (HOSPITAL_BASED_OUTPATIENT_CLINIC_OR_DEPARTMENT_OTHER): Payer: Medicaid Other

## 2014-02-04 ENCOUNTER — Encounter: Payer: Self-pay | Admitting: Family

## 2014-02-04 VITALS — BP 120/60 | HR 52 | Temp 97.7°F | Resp 14 | Ht 69.0 in | Wt 352.0 lb

## 2014-02-04 DIAGNOSIS — D510 Vitamin B12 deficiency anemia due to intrinsic factor deficiency: Secondary | ICD-10-CM

## 2014-02-04 DIAGNOSIS — D509 Iron deficiency anemia, unspecified: Secondary | ICD-10-CM

## 2014-02-04 LAB — CBC WITH DIFFERENTIAL (CANCER CENTER ONLY)
BASO#: 0 10*3/uL (ref 0.0–0.2)
BASO%: 0.3 % (ref 0.0–2.0)
EOS%: 1.8 % (ref 0.0–7.0)
Eosinophils Absolute: 0.1 10*3/uL (ref 0.0–0.5)
HEMATOCRIT: 38.3 % (ref 34.8–46.6)
HGB: 12.7 g/dL (ref 11.6–15.9)
LYMPH#: 1.3 10*3/uL (ref 0.9–3.3)
LYMPH%: 19.7 % (ref 14.0–48.0)
MCH: 32.2 pg (ref 26.0–34.0)
MCHC: 33.2 g/dL (ref 32.0–36.0)
MCV: 97 fL (ref 81–101)
MONO#: 0.4 10*3/uL (ref 0.1–0.9)
MONO%: 6.1 % (ref 0.0–13.0)
NEUT%: 72.1 % (ref 39.6–80.0)
NEUTROS ABS: 4.8 10*3/uL (ref 1.5–6.5)
PLATELETS: 215 10*3/uL (ref 145–400)
RBC: 3.95 10*6/uL (ref 3.70–5.32)
RDW: 13.3 % (ref 11.1–15.7)
WBC: 6.6 10*3/uL (ref 3.9–10.0)

## 2014-02-04 LAB — FERRITIN CHCC: Ferritin: 285 ng/ml — ABNORMAL HIGH (ref 9–269)

## 2014-02-04 LAB — CHCC SATELLITE - SMEAR

## 2014-02-04 LAB — RETICULOCYTES (CHCC)
ABS Retic: 104 10*3/uL (ref 19.0–186.0)
RBC.: 4 MIL/uL (ref 3.87–5.11)
Retic Ct Pct: 2.6 % — ABNORMAL HIGH (ref 0.4–2.3)

## 2014-02-04 LAB — IRON AND TIBC CHCC
%SAT: 33 % (ref 21–57)
IRON: 74 ug/dL (ref 41–142)
TIBC: 228 ug/dL — AB (ref 236–444)
UIBC: 153 ug/dL (ref 120–384)

## 2014-02-04 MED ORDER — CYANOCOBALAMIN 1000 MCG/ML IJ SOLN
1000.0000 ug | Freq: Once | INTRAMUSCULAR | Status: AC
  Administered 2014-02-04: 1000 ug via INTRAMUSCULAR

## 2014-02-04 MED ORDER — CYANOCOBALAMIN 1000 MCG/ML IJ SOLN
INTRAMUSCULAR | Status: AC
  Filled 2014-02-04: qty 1

## 2014-02-04 NOTE — Progress Notes (Signed)
[facility]  [phone]) [phone] [phone]) [phone]  ID: Stacey CoonsLorianne F [name] OB: 01/14/1966 MR#: [id number] JYN#:[id number]#:[id number] Patient Care Team: Kaleen MaskWilson Oliver Elkins, MD as PCP - General (Family Medicine)  DIAGNOSIS: Iron-deficiency anemia.  Vitamin B12 deficiency.  INTERVAL HISTORY: Stacey [name] is a pleasant 48 yo female here for followup. She states that she is doing well but still has some fatigue. She attributes this fatigue to some of the medications she takes. She states that her appetite is good. She denies headaches, dizziness, blurred vision, N/V, SOB, chest pain, palpitations, abdominal pain, diarrhea, problems urinating, blood in her urine or stool. She denies swelling or tenderness in her extremities. She states that she does still have some mild numbness and tingling in her fingers that is tolerable. She states that she does have tome constipation at times and that she drinks herbal tea to help with it. Her last iron infusion was 01/07/2014 and she did very well with it. We have checked her for parietal cell antibodies and intrinsic factor antibodies. These were both negative. Her last vitamin B12 level was 34 in January.   CURRENT TREATMENT: IV iron as indicated.  Vitamin B12 1 mg IM q.month.  REVIEW OF SYSTEMS: All other 10 point review of systems is negative except for those issues mentioned above.  PAST MEDICAL HISTORY: Past Medical History  Diagnosis Date  . Asthma   . Allergic rhinitis, cause unspecified   . Bipolar 1 disorder   . Morbid obesity   . Thyroid nodule   . Mental disorder   . 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 Pt has never been tested.   . Anxiety   . OSA (obstructive sleep apnea)     uses cpap does not know settings  . Complication of anesthesia 2004    bp dropped after c section, took 24hrs to feels leg , numbing medicine does not last long when injected into skin  . Difficult intravenous access     hard stick for ivs and  lab per pt  . Nasal bone fracture age 57    left side cannot place tube in nostril per pt  . Iron deficiency anemia, unspecified 02/24/2013  . Pernicious anemia 02/24/2013   PAST SURGICAL HISTORY: Past Surgical History  Procedure Laterality Date  . Tubal ligation    . Cesarean section    . Noraplant    . Wisdom tooth extraction    . Esophagogastroduodenoscopy N/A 01/02/2013    Procedure: ESOPHAGOGASTRODUODENOSCOPY (EGD);  Surgeon: Louis Meckelobert D Kaplan, MD;  [facility];  Service: Endoscopy;  Laterality: N/A;  . Colonoscopy with propofol N/A 01/02/2013    Procedure: COLONOSCOPY WITH PROPOFOL;  Surgeon: Louis Meckelobert D Kaplan, MD;  Location: [facility];  Service: Endoscopy;  Laterality: N/A;   FAMILY HISTORY Family History  Problem Relation Age of Onset  . Breast cancer Mother   . Asthma Mother   . Emphysema Paternal Grandmother   . Diabetes Father    GYNECOLOGIC HISTORY:  No LMP recorded.   SOCIAL HISTORY:  History   Social History  . Marital Status: Married    Spouse Name: Stacey [name]    Number of Children: 4  . Years of Education: Bachelors   Occupational History  . Unemployed    Social History Main Topics  . Smoking status: Former Smoker -- 0.50 packs/day for 20 years    Types: Cigarettes    Start date: 02/05/1987    Quit date: 07/30/2006  . Smokeless tobacco: Never Used  Comment: quit 7 years ago  . Alcohol Use: No  . Drug Use: No  . Sexual Activity: Yes    Birth Control/ Protection: Surgical   Other Topics Concern  . Not on file   Social History Narrative   [facility] 4 children 21, 48, 51, 10, she stays at home.   Patient has a Bachelor's degree.   Patient is right- handed.   Patient drinks one cup of coffee in the morning, sometimes 2 cups.         ADVANCED DIRECTIVES: <no information>  HEALTH MAINTENANCE: History  Substance Use Topics  . Smoking status: Former Smoker -- 0.50 packs/day for 20 years    Types: Cigarettes     Start date: 02/05/1987    Quit date: 07/30/2006  . Smokeless tobacco: Never Used     Comment: quit 7 years ago  . Alcohol Use: No   Colonoscopy: PAP: Bone density: Lipid panel:  Allergies  Allergen Reactions  . Bupropion Hcl Other (See Comments)    swelling in hands  . Capzasin [Capsaicin] Other (See Comments)    Red and burning  . Citalopram Hydrobromide Other (See Comments)    Doesn't remember reaction  . Other Other (See Comments)    Bleach cause vocal cord to close  . Stevia [Stevioside] Swelling    Face and throat swell   . Strawberry Other (See Comments)    Caused facial swelling and rash  . Lamictal [Lamotrigine] Rash  . Latex Other (See Comments)    Blisters where the latex touched it   Current Outpatient Prescriptions  Medication Sig Dispense Refill  . albuterol (PROVENTIL HFA;VENTOLIN HFA) 108 (90 BASE) MCG/ACT inhaler Inhale 2 puffs into the lungs 3 (three) times daily.       . beclomethasone (QVAR) 80 MCG/ACT inhaler Inhale 2 puffs into the lungs 3 (three) times daily as needed (for SOB).       . clonazePAM (KLONOPIN) 1 MG tablet Take 1 mg by mouth 2 (two) times daily.      . diphenhydrAMINE (BENADRYL) 25 MG tablet Take 25 mg by mouth every 6 (six) hours as needed for itching or allergies.      Marland Kitchen EPINEPHrine (EPI-PEN) 0.3 mg/0.3 mL DEVI Inject 0.3 mg into the muscle as needed (for allergic reaction).       Marland Kitchen FLUoxetine (PROZAC) 40 MG capsule Take 40 mg by mouth daily. TAKES 2 TABS IN PM = 80 MG DAILY      . gabapentin (NEURONTIN) 300 MG capsule Take 300-600 mg by mouth 3 (three) times daily. 300mg  in am, 300mg  at 2pm, and 600mg  at bedtime.      . hyoscyamine (LEVSIN, ANASPAZ) 0.125 MG tablet Take 0.125 mg by mouth every 4 (four) hours as needed for cramping.      . loratadine (CLARITIN) 10 MG tablet Take 10 mg by mouth every morning.       . meloxicam (MOBIC) 15 MG tablet Take 15 mg by mouth daily.      . methocarbamol (ROBAXIN) 500 MG tablet Take 250-500 mg by  mouth 3 (three) times daily.       Marland Kitchen nystatin cream (MYCOSTATIN) Apply 1 application topically 2 (two) times daily as needed (to yeast areas).      . ondansetron (ZOFRAN) 4 MG tablet Take 4 mg by mouth 2 (two) times daily as needed for nausea (migraines).      . pantoprazole (PROTONIX) 40 MG tablet Take 40 mg by  mouth every morning.      . rizatriptan (MAXALT-MLT) 10 MG disintegrating tablet Take 10 mg by mouth daily as needed for migraine. May repeat in 2 hours if needed for migraines      . ziprasidone (GEODON) 60 MG capsule Take 60 mg by mouth. Take 2 capsules daily       No current facility-administered medications for this visit.   OBJECTIVE: Filed Vitals:   02/04/14 0951  BP: 120/60  Pulse: 52  Temp: 97.7 F (36.5 C)  Resp: 14   Body mass index is 51.96 kg/(m^2). ECOG FS:0 - Asymptomatic Ocular: Sclerae unicteric, pupils equal, round and reactive to light Ear-nose-throat: Oropharynx clear, dentition fair Lymphatic: No cervical or supraclavicular adenopathy Lungs no rales or rhonchi, good excursion bilaterally Heart regular rate and rhythm, no murmur appreciated Abd soft, nontender, positive bowel sounds MSK no focal spinal tenderness, no joint edema Neuro: non-focal, well-oriented, appropriate affect Breasts: Deferred  LAB RESULTS:  No results found for this basename: SPEP, UPEP,  kappa and lambda light chains   Lab Results  Component Value Date   WBC 6.6 02/04/2014   NEUTROABS 4.8 02/04/2014   HGB 12.7 02/04/2014   HCT 38.3 02/04/2014   MCV 97 02/04/2014   PLT 215 02/04/2014   No results found for this basename: LABCA2   No components found with this basename: [id number]   No results found for this basename: INR,  in the last 168 hours  STUDIES: No results found.  ASSESSMENT/PLAN: Mrs. Venita Lick is a pleasant 48 year old white female with iron-deficiency anemia and vitamin B12 deficiency. She is being treated for both. She gets the B12 injections monthly.   She will  receive her B12 injection today as planned.   We discussed her CBC, all other labs are pending. She does not appear to need an iron infusion at this time. We will wait for her iron studies to confirm.   We will see her back in 3 months for labs and a follow-up appointment.   All questions were answered and she in agreement with this plan. She knows to call here with any questions or concerns and to go to the ED in the event of an emergency. We can certainly see her sooner if need be.  Verdie Mosher, NP 02/04/2014 10:15 AM

## 2014-02-04 NOTE — Patient Instructions (Signed)
Vitamin B12 Deficiency Not having enough vitamin B12 is called a deficiency. Your body needs this vitamin for important body functions. HOME CARE  Take all vitamins, herbs, or nutrition drinks (supplements) as told by your doctor.  Get shots (injections) as told. Do not miss your doctor visit.  Eat foods than contain vitamin B12. This includes:  Meat.  Chicken, [place]aw[place]urkey, or other birds (poultry).  Fish.  Eggs.  Cereals or milk with added vitamin B12. Check the label.  Do not drink too much (abuse) alcohol.  Keep all doctor visits as told. GET HELP IF:  You have questions.  Your problems come back. MAKE SURE YOU:  Understand these instructions.  Will watch your condition.  Will get help right away if you are not doing well or get worse. Document Released: 07/05/2011 Document Revised: 10/08/2011 Document Reviewed: 07/05/2011 [facility] Patient Information 2015 [place], [place]. This information is not intended to replace advice given to you by your health care provider. Make sure you discuss any questions you have with your health care provider.

## 2014-02-05 ENCOUNTER — Telehealth: Payer: Self-pay | Admitting: *Deleted

## 2014-02-05 NOTE — Telephone Encounter (Addendum)
Message copied by Burnett CorrenteUMLEY, Ersa Delaney L on Fri Feb 05, 2014 11:21 AM ------      Message from: Arlan OrganENNEVER, PETER R      Created: Thu Feb 04, 2014 10:08 PM       Cal - iron s much better!!  pete ------Informed pt that iron is better!

## 2014-02-10 ENCOUNTER — Telehealth: Payer: Self-pay | Admitting: Hematology & Oncology

## 2014-02-10 NOTE — Telephone Encounter (Signed)
Left message with 8-6 appointment

## 2014-02-17 ENCOUNTER — Other Ambulatory Visit: Payer: Self-pay

## 2014-02-17 DIAGNOSIS — Z1231 Encounter for screening mammogram for malignant neoplasm of breast: Secondary | ICD-10-CM

## 2014-03-03 ENCOUNTER — Ambulatory Visit
Admission: RE | Admit: 2014-03-03 | Discharge: 2014-03-03 | Disposition: A | Discharge status: Home / Self Care | Payer: Medicaid Other | Source: Ambulatory Visit

## 2014-03-03 DIAGNOSIS — Z1231 Encounter for screening mammogram for malignant neoplasm of breast: Secondary | ICD-10-CM

## 2014-03-04 ENCOUNTER — Other Ambulatory Visit (HOSPITAL_BASED_OUTPATIENT_CLINIC_OR_DEPARTMENT_OTHER): Payer: Medicaid Other | Admitting: Lab

## 2014-03-04 ENCOUNTER — Ambulatory Visit (HOSPITAL_BASED_OUTPATIENT_CLINIC_OR_DEPARTMENT_OTHER): Payer: Medicaid Other

## 2014-03-04 VITALS — BP 112/60 | HR 59 | Temp 98.2°F | Resp 16

## 2014-03-04 DIAGNOSIS — D509 Iron deficiency anemia, unspecified: Secondary | ICD-10-CM

## 2014-03-04 DIAGNOSIS — E538 Deficiency of other specified B group vitamins: Secondary | ICD-10-CM

## 2014-03-04 LAB — CBC WITH DIFFERENTIAL (CANCER CENTER ONLY)
BASO#: 0 10*3/uL (ref 0.0–0.2)
BASO%: 0.3 % (ref 0.0–2.0)
EOS ABS: 0.1 10*3/uL (ref 0.0–0.5)
EOS%: 1.3 % (ref 0.0–7.0)
HCT: 40.8 % (ref 34.8–46.6)
HEMOGLOBIN: 13.5 g/dL (ref 11.6–15.9)
LYMPH#: 0.8 10*3/uL — AB (ref 0.9–3.3)
LYMPH%: 10.9 % — ABNORMAL LOW (ref 14.0–48.0)
MCH: 31.9 pg (ref 26.0–34.0)
MCHC: 33.1 g/dL (ref 32.0–36.0)
MCV: 97 fL (ref 81–101)
MONO#: 0.4 10*3/uL (ref 0.1–0.9)
MONO%: 6.2 % (ref 0.0–13.0)
NEUT%: 81.3 % — AB (ref 39.6–80.0)
NEUTROS ABS: 5.7 10*3/uL (ref 1.5–6.5)
Platelets: 235 10*3/uL (ref 145–400)
RBC: 4.23 10*6/uL (ref 3.70–5.32)
RDW: 12.7 % (ref 11.1–15.7)
WBC: 7 10*3/uL (ref 3.9–10.0)

## 2014-03-04 LAB — IRON AND TIBC CHCC
%SAT: 32 % (ref 21–57)
IRON: 77 ug/dL (ref 41–142)
TIBC: 240 ug/dL (ref 236–444)
UIBC: 163 ug/dL (ref 120–384)

## 2014-03-04 LAB — RETICULOCYTES (CHCC)
ABS Retic: 104.8 10*3/uL (ref 19.0–186.0)
RBC.: 4.19 MIL/uL (ref 3.87–5.11)
Retic Ct Pct: 2.5 % — ABNORMAL HIGH (ref 0.4–2.3)

## 2014-03-04 LAB — CHCC SATELLITE - SMEAR

## 2014-03-04 LAB — FERRITIN CHCC: Ferritin: 292 ng/ml — ABNORMAL HIGH (ref 9–269)

## 2014-03-04 MED ORDER — CYANOCOBALAMIN 1000 MCG/ML IJ SOLN
INTRAMUSCULAR | Status: AC
  Filled 2014-03-04: qty 1

## 2014-03-04 MED ORDER — CYANOCOBALAMIN 1000 MCG/ML IJ SOLN
1000.0000 ug | Freq: Once | INTRAMUSCULAR | Status: AC
  Administered 2014-03-04: 1000 ug via INTRAMUSCULAR

## 2014-03-04 NOTE — Patient Instructions (Signed)

## 2014-04-01 ENCOUNTER — Other Ambulatory Visit: Payer: Self-pay | Admitting: Lab

## 2014-04-01 ENCOUNTER — Ambulatory Visit: Payer: Self-pay

## 2014-04-28 ENCOUNTER — Other Ambulatory Visit: Payer: Self-pay | Admitting: *Deleted

## 2014-04-28 DIAGNOSIS — D509 Iron deficiency anemia, unspecified: Secondary | ICD-10-CM

## 2014-04-29 ENCOUNTER — Encounter: Payer: Self-pay | Admitting: Family

## 2014-04-29 ENCOUNTER — Ambulatory Visit (HOSPITAL_BASED_OUTPATIENT_CLINIC_OR_DEPARTMENT_OTHER): Payer: Medicaid Other | Admitting: Family

## 2014-04-29 ENCOUNTER — Ambulatory Visit (HOSPITAL_BASED_OUTPATIENT_CLINIC_OR_DEPARTMENT_OTHER): Payer: Medicaid Other

## 2014-04-29 ENCOUNTER — Other Ambulatory Visit (HOSPITAL_BASED_OUTPATIENT_CLINIC_OR_DEPARTMENT_OTHER): Payer: Medicaid Other | Admitting: Lab

## 2014-04-29 VITALS — BP 123/68 | HR 79 | Temp 98.3°F | Resp 16 | Ht 69.0 in | Wt 330.0 lb

## 2014-04-29 DIAGNOSIS — E538 Deficiency of other specified B group vitamins: Secondary | ICD-10-CM

## 2014-04-29 DIAGNOSIS — E041 Nontoxic single thyroid nodule: Secondary | ICD-10-CM

## 2014-04-29 DIAGNOSIS — Z23 Encounter for immunization: Secondary | ICD-10-CM

## 2014-04-29 DIAGNOSIS — D509 Iron deficiency anemia, unspecified: Secondary | ICD-10-CM

## 2014-04-29 DIAGNOSIS — D510 Vitamin B12 deficiency anemia due to intrinsic factor deficiency: Secondary | ICD-10-CM

## 2014-04-29 DIAGNOSIS — D649 Anemia, unspecified: Secondary | ICD-10-CM

## 2014-04-29 LAB — CBC WITH DIFFERENTIAL (CANCER CENTER ONLY)
BASO#: 0 10*3/uL (ref 0.0–0.2)
BASO%: 0.1 % (ref 0.0–2.0)
EOS%: 0.9 % (ref 0.0–7.0)
Eosinophils Absolute: 0.1 10*3/uL (ref 0.0–0.5)
HEMATOCRIT: 38.6 % (ref 34.8–46.6)
HEMOGLOBIN: 12.8 g/dL (ref 11.6–15.9)
LYMPH#: 0.7 10*3/uL — AB (ref 0.9–3.3)
LYMPH%: 8.7 % — ABNORMAL LOW (ref 14.0–48.0)
MCH: 31.4 pg (ref 26.0–34.0)
MCHC: 33.2 g/dL (ref 32.0–36.0)
MCV: 95 fL (ref 81–101)
MONO#: 0.4 10*3/uL (ref 0.1–0.9)
MONO%: 4.6 % (ref 0.0–13.0)
NEUT%: 85.7 % — AB (ref 39.6–80.0)
NEUTROS ABS: 6.6 10*3/uL — AB (ref 1.5–6.5)
Platelets: 227 10*3/uL (ref 145–400)
RBC: 4.07 10*6/uL (ref 3.70–5.32)
RDW: 12.8 % (ref 11.1–15.7)
WBC: 7.7 10*3/uL (ref 3.9–10.0)

## 2014-04-29 LAB — IRON AND TIBC CHCC
%SAT: 35 % (ref 21–57)
Iron: 77 ug/dL (ref 41–142)
TIBC: 220 ug/dL — AB (ref 236–444)
UIBC: 143 ug/dL (ref 120–384)

## 2014-04-29 LAB — VITAMIN B12: VITAMIN B 12: 353 pg/mL (ref 211–911)

## 2014-04-29 LAB — RETICULOCYTES (CHCC)
ABS RETIC: 100.1 10*3/uL (ref 19.0–186.0)
RBC.: 4.17 MIL/uL (ref 3.87–5.11)
Retic Ct Pct: 2.4 % — ABNORMAL HIGH (ref 0.4–2.3)

## 2014-04-29 LAB — FERRITIN CHCC: Ferritin: 278 ng/ml — ABNORMAL HIGH (ref 9–269)

## 2014-04-29 MED ORDER — INFLUENZA VAC SPLIT QUAD 0.5 ML IM SUSY
0.5000 mL | PREFILLED_SYRINGE | Freq: Once | INTRAMUSCULAR | Status: AC
  Administered 2014-04-29: 0.5 mL via INTRAMUSCULAR
  Filled 2014-04-29: qty 0.5

## 2014-04-29 MED ORDER — CYANOCOBALAMIN 1000 MCG/ML IJ SOLN
INTRAMUSCULAR | Status: AC
  Filled 2014-04-29: qty 1

## 2014-04-29 MED ORDER — CYANOCOBALAMIN 1000 MCG/ML IJ SOLN
1000.0000 ug | Freq: Once | INTRAMUSCULAR | Status: AC
  Administered 2014-04-29: 1000 ug via INTRAMUSCULAR

## 2014-04-29 NOTE — Progress Notes (Signed)
[facility]  [phone] [phone]  ID: Stacey [name] OB: [birth year] MR#: [id number]#:[id number] Patient Care Team: Kaleen Mask, MD as PCP - General (Family Medicine)  DIAGNOSIS: Iron-deficiency anemia.  Vitamin B12 deficiency.  INTERVAL HISTORY: [name] is a pleasant 48 yo female here for followup. She states that she is doing well and that her energy level is improving. She denies headaches, dizziness, blurred vision, N/V, SOB, chest pain, palpitations, abdominal pain, constipation, diarrhea, problems urinating, blood in her urine or stool. She denies swelling or tenderness in her extremities. She states that she does still have some mild numbness and tingling in her fingers that is tolerable. Her last iron infusion was 01/07/2014 and she did very well with it. Her appetite is good and she is drinking plenty of fluids.   CURRENT TREATMENT: IV iron as indicated.  Vitamin B12 1 mg IM q.month.  REVIEW OF SYSTEMS: All other 10 point review of systems is negative except for those issues mentioned above.  PAST MEDICAL HISTORY: Past Medical History  Diagnosis Date  . Asthma   . Allergic rhinitis, cause unspecified   . Bipolar 1 disorder   . Morbid obesity   . Thyroid nodule   . Mental disorder   . 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 Pt has never been tested.   . Anxiety   . OSA (obstructive sleep apnea)     uses cpap does not know settings  . Complication of anesthesia 2004    bp dropped after c section, took 24hrs to feels leg , numbing medicine does not last long when injected into skin  . Difficult intravenous access     hard stick for ivs and lab per pt  . Nasal bone fracture age 36    left side cannot place tube in nostril per pt  . Iron deficiency anemia, unspecified 02/24/2013  . Pernicious anemia 02/24/2013   PAST SURGICAL HISTORY: Past Surgical History  Procedure Laterality Date  . Tubal ligation    .  Cesarean section    . Noraplant    . Wisdom tooth extraction    . Esophagogastroduodenoscopy N/A 01/02/2013    Procedure: ESOPHAGOGASTRODUODENOSCOPY (EGD);  Surgeon: Louis Meckel, MD;  Location: [facility];  Service: Endoscopy;  Laterality: N/A;  . Colonoscopy with propofol N/A 01/02/2013    Procedure: COLONOSCOPY WITH PROPOFOL;  Surgeon: Louis Meckel, MD;  Location: [facility];  Service: Endoscopy;  Laterality: N/A;   FAMILY HISTORY Family History  Problem Relation Age of Onset  . Breast cancer Mother   . Asthma Mother   . Emphysema Paternal Grandmother   . Diabetes Father    GYNECOLOGIC HISTORY:  No LMP recorded.   SOCIAL HISTORY:  History   Social History  . Marital Status: Married    Spouse Name: Stacey [name]    Number of Children: 4  . Years of Education: Bachelors   Occupational History  . Unemployed    Social History Main Topics  . Smoking status: Former Smoker -- 0.50 packs/day for 20 years    Types: Cigarettes    Start date: 02/05/1987    Quit date: 07/30/2006  . Smokeless tobacco: Never Used     Comment: quit 7 years ago  . Alcohol Use: No  . Drug Use: No  . Sexual Activity: Yes    Birth Control/ Protection: Surgical   Other Topics Concern  . Not on file   Social History Narrative   She lives  [facility] 4 children 21, 15, 13, 10, she stays at home.   Patient has a Bachelor's degree.   Patient is right- handed.   Patient drinks one cup of coffee in the morning, sometimes 2 cups.         ADVANCED DIRECTIVES: <no information>  HEALTH MAINTENANCE: History  Substance Use Topics  . Smoking status: Former Smoker -- 0.50 packs/day for 20 years    Types: Cigarettes    Start date: 02/05/1987    Quit date: 07/30/2006  . Smokeless tobacco: Never Used     Comment: quit 7 years ago  . Alcohol Use: No   Colonoscopy: PAP: Bone density: Lipid panel:  Allergies  Allergen Reactions  . Bupropion Hcl Other (See Comments)    swelling in hands   . Capzasin [Capsaicin] Other (See Comments)    Red and burning  . Citalopram Hydrobromide Other (See Comments)    Doesn't remember reaction  . Other Other (See Comments)    Bleach cause vocal cord to close  . Stevia [Stevioside] Swelling    Face and throat swell   . Strawberry Other (See Comments)    Caused facial swelling and rash  . Lamictal [Lamotrigine] Rash  . Latex Other (See Comments)    Blisters where the latex touched it   Current Outpatient Prescriptions  Medication Sig Dispense Refill  . albuterol (PROVENTIL HFA;VENTOLIN HFA) 108 (90 BASE) MCG/ACT inhaler Inhale 2 puffs into the lungs 3 (three) times daily.       . beclomethasone (QVAR) 80 MCG/ACT inhaler Inhale 2 puffs into the lungs 3 (three) times daily as needed (for SOB).       . clonazePAM (KLONOPIN) 1 MG tablet Take 1 mg by mouth 2 (two) times daily.      . diphenhydrAMINE (BENADRYL) 25 MG tablet Take 25 mg by mouth every 6 (six) hours as needed for itching or allergies.      Marland Kitchen EPINEPHrine (EPI-PEN) 0.3 mg/0.3 mL DEVI Inject 0.3 mg into the muscle as needed (for allergic reaction).       Marland Kitchen FLUoxetine (PROZAC) 40 MG capsule Take 40 mg by mouth daily. TAKES 2 TABS IN PM = 80 MG DAILY      . gabapentin (NEURONTIN) 300 MG capsule Take 300-600 mg by mouth 3 (three) times daily. 300mg  in am, 300mg  at 2pm, and 600mg  at bedtime.      . hyoscyamine (LEVSIN, ANASPAZ) 0.125 MG tablet Take 0.125 mg by mouth every 4 (four) hours as needed for cramping.      . loratadine (CLARITIN) 10 MG tablet Take 10 mg by mouth every morning.       . meloxicam (MOBIC) 15 MG tablet Take 15 mg by mouth daily.      . methocarbamol (ROBAXIN) 500 MG tablet Take 250-500 mg by mouth 3 (three) times daily.       Marland Kitchen nystatin cream (MYCOSTATIN) Apply 1 application topically 2 (two) times daily as needed (to yeast areas).      Marland Kitchen omeprazole (PRILOSEC) 40 MG capsule Take 40 mg by mouth daily.      . ondansetron (ZOFRAN) 4 MG tablet Take 4 mg by mouth 2 (two)  times daily as needed for nausea (migraines).      . rizatriptan (MAXALT-MLT) 10 MG disintegrating tablet Take 10 mg by mouth daily as needed for migraine. May repeat in 2 hours if needed for migraines      . traMADol-acetaminophen (ULTRACET) 37.5-325 MG per tablet Take  1 tablet by mouth every 8 (eight) hours as needed.      . ziprasidone (GEODON) 60 MG capsule Take 60 mg by mouth. Take 2 capsules daily       Current Facility-Administered Medications  Medication Dose Route Frequency Provider Last Rate Last Dose  . Influenza vac split quadrivalent PF (FLUARIX) injection 0.5 mL  0.5 mL Intramuscular Once Josph MachoPeter R Ennever, MD       OBJECTIVE: Filed Vitals:   04/29/14 1351  BP: 123/68  Pulse: 79  Temp: 98.3 F (36.8 C)  Resp: 16   Body mass index is 48.71 kg/(m^2). ECOG FS:0 - Asymptomatic Ocular: Sclerae unicteric, pupils equal, round and reactive to light Ear-nose-throat: Oropharynx clear, dentition fair Lymphatic: No cervical or supraclavicular adenopathy Lungs no rales or rhonchi, good excursion bilaterally Heart regular rate and rhythm, no murmur appreciated Abd soft, nontender, positive bowel sounds MSK no focal spinal tenderness, no joint edema Neuro: non-focal, well-oriented, appropriate affect Breasts: Deferred  LAB RESULTS:  No results found for this basename: SPEP,  UPEP,   kappa and lambda light chains   Lab Results  Component Value Date   WBC 7.7 04/29/2014   NEUTROABS 6.6* 04/29/2014   HGB 12.8 04/29/2014   HCT 38.6 04/29/2014   MCV 95 04/29/2014   PLT 227 04/29/2014   No results found for this basename: LABCA2   No components found with this basename: [id number]   No results found for this basename: INR,  in the last 168 hours  STUDIES: No results found.  ASSESSMENT/PLAN: Mrs. Stacey LickChavezhusch is a pleasant 48 year old white female with iron-deficiency anemia and vitamin B12 deficiency. She is being treated for both. She gets the B12 injections monthly and iron  infusions as needed.   She will receive her B12 injection today as planned.   Her CBC today looked good. We will see what her iron studies show.   We will see her back in 3 months for labs and a follow-up appointment.   All questions were answered and she in agreement with this plan. She knows to call here with any questions or concerns and to go to the ED in the event of an emergency. We can certainly see her sooner if need be.  Verdie MosherINCINNATI,Burnard Enis M, NP 04/29/2014 2:22 PM

## 2014-04-29 NOTE — Patient Instructions (Signed)
Influenza Virus Vaccine injection What is this medicine? INFLUENZA VIRUS VACCINE (in floo EN zuh VAHY ruhs vak SEEN) helps to reduce the risk of getting influenza also known as the flu. The vaccine only helps protect you against some strains of the flu. This medicine may be used for other purposes; ask your health care provider or pharmacist if you have questions. COMMON BRAND NAME(S): Afluria, Agriflu, Fluarix, Fluarix Quadrivalent, FLUCELVAX, Flulaval, Fluvirin, Fluzone, Fluzone High-Dose, Fluzone Intradermal What should I tell my health care provider before I take this medicine? They need to know if you have any of these conditions: -bleeding disorder like hemophilia -fever or infection -Guillain-Barre syndrome or other neurological problems -immune system problems -infection with the human immunodeficiency virus (HIV) or AIDS -low blood platelet counts -multiple sclerosis -an unusual or allergic reaction to influenza virus vaccine, latex, other medicines, foods, dyes, or preservatives. Different brands of vaccines contain different allergens. Some may contain latex or eggs. Talk to your doctor about your allergies to make sure that you get the right vaccine. -pregnant or trying to get pregnant -breast-feeding How should I use this medicine? This vaccine is for injection into a muscle or under the skin. It is given by a health care professional. A copy of Vaccine Information Statements will be given before each vaccination. Read this sheet carefully each time. The sheet may change frequently. Talk to your healthcare provider to see which vaccines are right for you. Some vaccines should not be used in all age groups. Overdosage: If you think you have taken too much of this medicine contact a poison control center or emergency room at once. NOTE: This medicine is only for you. Do not share this medicine with others. What if I miss a dose? This does not apply. What may interact with this  medicine? -chemotherapy or radiation therapy -medicines that lower your immune system like etanercept, anakinra, infliximab, and adalimumab -medicines that treat or prevent blood clots like warfarin -phenytoin -steroid medicines like prednisone or cortisone -theophylline -vaccines This list may not describe all possible interactions. Give your health care provider a list of all the medicines, herbs, non-prescription drugs, or dietary supplements you use. Also tell them if you smoke, drink alcohol, or use illegal drugs. Some items may interact with your medicine. What should I watch for while using this medicine? Report any side effects that do not go away within 3 days to your doctor or health care professional. Call your health care provider if any unusual symptoms occur within 6 weeks of receiving this vaccine. You may still catch the flu, but the illness is not usually as bad. You cannot get the flu from the vaccine. The vaccine will not protect against colds or other illnesses that may cause fever. The vaccine is needed every year. What side effects may I notice from receiving this medicine? Side effects that you should report to your doctor or health care professional as soon as possible: -allergic reactions like skin rash, itching or hives, swelling of the face, lips, or tongue Side effects that usually do not require medical attention (report to your doctor or health care professional if they continue or are bothersome): -fever -headache -muscle aches and pains -pain, tenderness, redness, or swelling at the injection site -tiredness This list may not describe all possible side effects. Call your doctor for medical advice about side effects. You may report side effects to FDA at 1-800-FDA-1088. Where should I keep my medicine? The vaccine will be given by a health   care professional in a clinic, pharmacy, doctor's office, or other health care setting. You will not be given vaccine doses  to store at home. NOTE: This sheet is a summary. It may not cover all possible information. If you have questions about this medicine, talk to your doctor, pharmacist, or health care provider.  2015, Elsevier/Gold Standard. (2012-01-24 13:08:28) Vitamin B12 Injections Every person needs vitamin B12. A deficiency develops when the body does not get enough of it. One way to overcome this is by getting B12 shots (injections). A B12 shot puts the vitamin directly into muscle tissue. This avoids any problems your body might have in absorbing it from food or a pill. In some people, the body has trouble using the vitamin correctly. This can cause a B12 deficiency. Not consuming enough of the vitamin can also cause a deficiency. Getting enough vitamin B12 can be hard for elderly people. Sometimes, they do not eat a well-balanced diet. The elderly are also more likely than younger people to have medical conditions or take medications that can lead to a deficiency. WHAT DOES VITAMIN B12 DO? Vitamin B12 does many things to help the body work right:  It helps the body make healthy red blood cells.  It helps maintain nerve cells.  It is involved in the body's process of converting food into energy (metabolism).  It is needed to make the genetic material in all cells (DNA). VITAMIN B12 FOOD SOURCES Most people get plenty of vitamin B12 through the foods they eat. It is present in:  Meat, fish, poultry, and eggs.  Milk and milk products.  It also is added when certain foods are made, including some breads, cereals and yogurts. The food is then called "fortified". CAUSES The most common causes of vitamin B12 deficiency are:  Pernicious anemia. The condition develops when the body cannot make enough healthy red blood cells. This stems from a lack of a protein made in the stomach (intrinsic factor). People without this protein cannot absorb enough vitamin B12 from food.  Malabsorption. This is when the  body cannot absorb the vitamin. It can be caused by:  Pernicious anemia.  Surgery to remove part or all of the stomach can lead to malabsorption. Removal of part or all of the small intestine can also cause malabsorption.  Vegetarian diet. People who are strict about not eating foods from animals could have trouble taking in enough vitamin B12 from diet alone.  Medications. Some medicines have been linked to B12 deficiency, such as Metformin (a drug prescribed for type 2 diabetes). Long-term use of stomach acid suppressants also can keep the vitamin from being absorbed.  Intestinal problems such as inflammatory bowel disease. If there are problems in the digestive tract, vitamin B12 may not be absorbed in good enough amounts. SYMPTOMS People who do not get enough B12 can develop problems. These can include:  Anemia. This is when the body has too few red blood cells. Red blood cells carry oxygen to the rest of the body. Without a healthy supply of red blood cells, people can feel:  Tired (fatigued).  Weak.  Severe anemia can cause:  Shortness of breath.  Dizziness.  Rapid heart rate.  Paleness.  Other Vitamin B12 deficiency symptoms include:  Diarrhea.  Numbness or tingling in the hands or feet.  Loss of appetite.  Confusion.  Sores on the tongue or in the mouth. LET YOUR CAREGIVER KNOW ABOUT:  Any allergies. It is very important to know if you are  allergic or sensitive to cobalt. Vitamin B12 contains cobalt.  Any history of kidney disease.  All medications you are taking. Include prescription and over-the-counter medicines, herbs and creams.  Whether you are pregnant or breast-feeding.  If you have Leber's disease, a hereditary eye condition, vitamin B12 could make it worse. RISKS AND COMPLICATIONS Reactions to an injection are usually temporary. They might include:  Pain at the injection site.  Redness, swelling or tenderness at the site.  Headache,  dizziness or weakness.  Nausea, upset stomach or diarrhea.  Numbness or tingling.  Fever.  Joint pain.  Itching or rash. If a reaction does not go away in a short while, talk with your healthcare provider. A change in the way the shots are given, or where they are given, might need to be made. BEFORE AN INJECTION To decide whether B12 injections are right for you, your healthcare provider will probably:  Ask about your medical history.  Ask questions about your diet.  Ask about symptoms such as:  Have you felt weak?  Do you feel unusually tired?  Do you get dizzy?  Order blood tests. These may include a test to:  Check the level of red cells in your blood.  Measure B12 levels.  Check for the presence of intrinsic factor. VITAMIN B12 INJECTIONS How often you will need a vitamin B12 injection will depend on how severe your deficiency is. This also will affect how long you will need to get them. People with pernicious anemia usually get injections for their entire life. Others might get them for a shorter period. For many people, injections are given daily or weekly for several weeks. Then, once B12 levels are normal, injections are given just once a month. If the cause of the deficiency can be fixed, the injections can be stopped. Talk with your healthcare provider about what you should expect. For an injection:  The injection site will be cleaned with an alcohol swab.  Your healthcare provider will insert a needle directly into a muscle. Most any muscle can be used. Most often, an arm muscle is used. A buttocks muscle can also be used. Many people say shots in that area are less painful.  A small adhesive bandage may be put over the injection site. It usually can be taken off in an hour or less. Injections can be given by your healthcare provider. In some cases, family members give them. Sometimes, people give them to themselves. Talk with your healthcare provider about what  would be best for you. If someone other than your healthcare provider will be giving the shots, the person will need to be trained to give them correctly. HOME CARE INSTRUCTIONS   You can remove the adhesive bandage within an hour of getting a shot.  You should be able to go about your normal activities right away.  Avoid drinking large amounts of alcohol while taking vitamin B12 shots. Alcohol can interfere with the body's use of the vitamin. SEEK MEDICAL CARE IF:   Pain, redness, swelling or tenderness at the injection site does not get better or gets worse.  Headache, dizziness or weakness does not go away.  You develop a fever of more than 100.5 F (38.1 C). SEEK IMMEDIATE MEDICAL CARE IF:   You have chest pain.  You develop shortness of breath.  You have muscle weakness that gets worse.  You develop numbness, weakness or tingling on one side or one area of the body.  You have symptoms of  an allergic reaction, such as:  Hives.  Difficulty breathing.  Swelling of the lips, face, tongue or throat.  You develop a fever of more than 102.0 F (38.9 C). MAKE SURE YOU:   Understand these instructions.  Will watch your condition.  Will get help right away if you are not doing well or get worse. Document Released: 10/12/2008 Document Revised: 10/08/2011 Document Reviewed: 10/12/2008 [facility] Patient Information 2015 [place], [place]. This information is not intended to replace advice given to you by your health care provider. Make sure you discuss any questions you have with your health care provider.

## 2014-04-30 ENCOUNTER — Telehealth: Payer: Self-pay | Admitting: Hematology & Oncology

## 2014-04-30 NOTE — Telephone Encounter (Signed)
Mailed jan schedule °

## 2014-05-03 ENCOUNTER — Telehealth: Payer: Self-pay | Admitting: Nurse Practitioner

## 2014-05-03 NOTE — Telephone Encounter (Addendum)
Message copied by Glee ArvinPICKENPACK-COUSAR, Gearl Baratta N on Mon May 03, 2014  1:48 PM ------      Message from: Josph MachoENNEVER, PETER R      Created: Fri Apr 30, 2014  7:45 AM       Call - iron is ok!! pete ------LVM on pt's personal machine and informed her to contact office with any further questions or concerns.

## 2014-05-31 ENCOUNTER — Encounter: Payer: Self-pay | Admitting: Family

## 2014-06-23 ENCOUNTER — Encounter: Payer: Self-pay | Admitting: *Deleted

## 2014-06-23 NOTE — Progress Notes (Signed)
[facility] per practice/patient request.

## 2014-07-30 ENCOUNTER — Emergency Department (HOSPITAL_COMMUNITY): Payer: Medicaid Other

## 2014-07-30 ENCOUNTER — Encounter (HOSPITAL_COMMUNITY): Payer: Self-pay | Admitting: *Deleted

## 2014-07-30 ENCOUNTER — Emergency Department (HOSPITAL_COMMUNITY)
Admission: EM | Admit: 2014-07-30 | Discharge: 2014-07-30 | Disposition: A | Discharge status: Home / Self Care | Payer: Medicaid Other | Attending: Emergency Medicine | Admitting: Emergency Medicine

## 2014-07-30 DIAGNOSIS — F319 Bipolar disorder, unspecified: Secondary | ICD-10-CM | POA: Diagnosis not present

## 2014-07-30 DIAGNOSIS — J45909 Unspecified asthma, uncomplicated: Secondary | ICD-10-CM | POA: Insufficient documentation

## 2014-07-30 DIAGNOSIS — Z87891 Personal history of nicotine dependence: Secondary | ICD-10-CM | POA: Diagnosis not present

## 2014-07-30 DIAGNOSIS — Z79899 Other long term (current) drug therapy: Secondary | ICD-10-CM | POA: Insufficient documentation

## 2014-07-30 DIAGNOSIS — G4733 Obstructive sleep apnea (adult) (pediatric): Secondary | ICD-10-CM | POA: Insufficient documentation

## 2014-07-30 DIAGNOSIS — Z7952 Long term (current) use of systemic steroids: Secondary | ICD-10-CM | POA: Insufficient documentation

## 2014-07-30 DIAGNOSIS — Z862 Personal history of diseases of the blood and blood-forming organs and certain disorders involving the immune mechanism: Secondary | ICD-10-CM | POA: Insufficient documentation

## 2014-07-30 DIAGNOSIS — M542 Cervicalgia: Secondary | ICD-10-CM | POA: Diagnosis not present

## 2014-07-30 DIAGNOSIS — E041 Nontoxic single thyroid nodule: Secondary | ICD-10-CM | POA: Diagnosis not present

## 2014-07-30 DIAGNOSIS — Z7982 Long term (current) use of aspirin: Secondary | ICD-10-CM | POA: Insufficient documentation

## 2014-07-30 DIAGNOSIS — Z791 Long term (current) use of non-steroidal anti-inflammatories (NSAID): Secondary | ICD-10-CM | POA: Insufficient documentation

## 2014-07-30 DIAGNOSIS — R220 Localized swelling, mass and lump, head: Secondary | ICD-10-CM | POA: Diagnosis present

## 2014-07-30 DIAGNOSIS — Z8739 Personal history of other diseases of the musculoskeletal system and connective tissue: Secondary | ICD-10-CM | POA: Diagnosis not present

## 2014-07-30 DIAGNOSIS — Z91040 Latex allergy status: Secondary | ICD-10-CM | POA: Insufficient documentation

## 2014-07-30 DIAGNOSIS — Z8781 Personal history of (healed) traumatic fracture: Secondary | ICD-10-CM | POA: Diagnosis not present

## 2014-07-30 DIAGNOSIS — R51 Headache: Secondary | ICD-10-CM | POA: Diagnosis not present

## 2014-07-30 DIAGNOSIS — R519 Headache, unspecified: Secondary | ICD-10-CM

## 2014-07-30 DIAGNOSIS — F419 Anxiety disorder, unspecified: Secondary | ICD-10-CM | POA: Diagnosis not present

## 2014-07-30 HISTORY — DX: Fibromyalgia: M79.7

## 2014-07-30 MED ORDER — HYDROMORPHONE HCL 1 MG/ML IJ SOLN
1.0000 mg | Freq: Once | INTRAMUSCULAR | Status: AC
  Administered 2014-07-30: 1 mg via INTRAMUSCULAR
  Filled 2014-07-30: qty 1

## 2014-07-30 MED ORDER — KETOROLAC TROMETHAMINE 60 MG/2ML IM SOLN
60.0000 mg | Freq: Once | INTRAMUSCULAR | Status: AC
  Administered 2014-07-30: 60 mg via INTRAMUSCULAR
  Filled 2014-07-30: qty 2

## 2014-07-30 NOTE — ED Notes (Signed)
Pt is in CT scan.

## 2014-07-30 NOTE — ED Provider Notes (Signed)
CSN: [id number]     Arrival date & time 07/30/14  0055 History   First MD Initiated Contact with Patient 07/30/14 [phone]     Chief Complaint  Patient presents with  . swelling to head      (Consider location/radiation/quality/duration/timing/severity/associated sxs/prior Treatment) Patient is a 49 y.o. female presenting with headaches. The history is provided by the patient. No language interpreter was used.  Headache Pain location:  Occipital Radiates to:  R neck Onset quality:  Unable to specify Duration:  1 day Timing:  Constant Similar to prior headaches: no   Associated symptoms: neck pain   Associated symptoms: no abdominal pain, no back pain, no congestion, no cough, no fever, no sinus pressure and no vomiting   Associated symptoms comment:  Sharp shooting pain in the right neck that radiates into occipital region that woke her from sleep yesterday around 3:00 am. No nausea, vomiting, speech difficulty, weakness, numbness. No history of similar headaches. She tried taking one of her Ultracets and also one of her percocets without change in symptoms. No alleviating or aggravating factors, including movement of the neck.    Past Medical History  Diagnosis Date  . Asthma   . Allergic rhinitis, cause unspecified   . Bipolar 1 disorder   . Morbid obesity   . Thyroid nodule   . Mental disorder   . 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 Pt has never been tested.   . Anxiety   . OSA (obstructive sleep apnea)     uses cpap does not know settings  . Complication of anesthesia 2004    bp dropped after c section, took 24hrs to feels leg , numbing medicine does not last long when injected into skin  . Difficult intravenous access     hard stick for ivs and lab per pt  . Nasal bone fracture age 71    left side cannot place tube in nostril per pt  . Iron deficiency anemia, unspecified 02/24/2013  . Pernicious anemia 02/24/2013  . Anxiety   . Fibromyalgia    Past Surgical History   Procedure Laterality Date  . Tubal ligation    . Cesarean section    . Noraplant    . Wisdom tooth extraction    . Esophagogastroduodenoscopy N/A 01/02/2013    Procedure: ESOPHAGOGASTRODUODENOSCOPY (EGD);  Surgeon: Louis Meckel, MD;  Location: [facility];  Service: Endoscopy;  Laterality: N/A;  . Colonoscopy with propofol N/A 01/02/2013    Procedure: COLONOSCOPY WITH PROPOFOL;  Surgeon: Louis Meckel, MD;  Location: [facility];  Service: Endoscopy;  Laterality: N/A;   Family History  Problem Relation Age of Onset  . Breast cancer Mother   . Asthma Mother   . Emphysema Paternal Grandmother   . Diabetes Father    History  Substance Use Topics  . Smoking status: Former Smoker -- 0.50 packs/day for 20 years    Types: Cigarettes    Start date: 02/05/1987    Quit date: 07/30/2006  . Smokeless tobacco: Never Used     Comment: quit 7 years ago  . Alcohol Use: Yes     Comment: socially   OB History    Gravida Para Term Preterm AB TAB SAB Ectopic Multiple Living   Review of Systems  Constitutional: Negative for fever and chills.  HENT: Negative for congestion and sinus pressure.   Eyes: Negative for visual disturbance.  Respiratory:  Negative.  Negative for cough and shortness of breath.   Cardiovascular: Negative.  Negative for chest pain.  Gastrointestinal: Negative.  Negative for vomiting and abdominal pain.  Musculoskeletal: Positive for neck pain. Negative for back pain.  Skin: Negative.   Neurological: Positive for headaches.  Psychiatric/Behavioral: Negative for confusion.      Allergies  Bupropion hcl; Capzasin; Citalopram hydrobromide; Other; Stevia; Strawberry; Wellbutrin; Lamictal; and Latex  Home Medications   Prior to Admission medications   Medication Sig Start Date End Date Taking? Authorizing Provider  acidophilus (RISAQUAD) CAPS capsule Take 1 capsule by mouth daily.   Yes Historical Provider, MD  albuterol (PROVENTIL  HFA;VENTOLIN HFA) 108 (90 BASE) MCG/ACT inhaler Inhale 2 puffs into the lungs 3 (three) times daily.    Yes Historical Provider, MD  aspirin 325 MG tablet Take 650 mg by mouth 3 (three) times daily.   Yes Historical Provider, MD  beclomethasone (QVAR) 80 MCG/ACT inhaler Inhale 2 puffs into the lungs 2 (two) times daily.    Yes Historical Provider, MD  cholecalciferol (VITAMIN D) 1000 UNITS tablet Take 1,000 Units by mouth daily.   Yes Historical Provider, MD  clonazePAM (KLONOPIN) 1 MG tablet Take 1 mg by mouth 2 (two) times daily.   Yes Historical Provider, MD  diphenhydrAMINE (BENADRYL) 25 MG tablet Take 25 mg by mouth every 6 (six) hours as needed for itching or allergies.   Yes Historical Provider, MD  FLUoxetine (PROZAC) 40 MG capsule Take 80 mg by mouth every evening.    Yes Historical Provider, MD  gabapentin (NEURONTIN) 300 MG capsule Take 300-600 mg by mouth 3 (three) times daily.  in am,  at 2pm, and  at bedtime.   Yes Historical Provider, MD  methocarbamol (ROBAXIN) 500 MG tablet Take 1,000 mg by mouth every 8 (eight) hours as needed for muscle spasms.    Yes Historical Provider, MD  nystatin cream (MYCOSTATIN) Apply 1 application topically 2 (two) times daily as needed (to yeast areas).   Yes Historical Provider, MD  omeprazole (PRILOSEC) 40 MG capsule Take 40 mg by mouth daily.   Yes Historical Provider, MD  OVER THE COUNTER MEDICATION Take 1 tablet by mouth daily. Tumeric   Yes Historical Provider, MD  oxyCODONE-acetaminophen (PERCOCET/ROXICET) 5-325 MG per tablet Take 1 tablet by mouth once.   Yes Historical Provider, MD  traMADol-acetaminophen (ULTRACET) 37.5-325 MG per tablet Take 1 tablet by mouth every 8 (eight) hours as needed for moderate pain.    Yes Historical Provider, MD  vitamin B-12 (CYANOCOBALAMIN) 1000 MCG tablet Take 1,000 mcg by mouth daily.   Yes Historical Provider, MD  ziprasidone (GEODON) 60 MG capsule Take 120 mg by mouth at bedtime. Take 2 capsules  daily   Yes Historical Provider, MD  EPINEPHrine (EPI-PEN) 0.3 mg/0.3 mL DEVI Inject 0.3 mg into the muscle as needed (for allergic reaction).     Historical Provider, MD  hyoscyamine (LEVSIN, ANASPAZ) 0.125 MG tablet Take 0.125 mg by mouth every 4 (four) hours as needed for cramping. 12/08/12   Louis Meckel, MD  loratadine (CLARITIN) 10 MG tablet Take 10 mg by mouth every morning.     Historical Provider, MD  meloxicam (MOBIC) 15 MG tablet Take 15 mg by mouth daily.    Historical Provider, MD  ondansetron (ZOFRAN) 4 MG tablet Take 4 mg by mouth 2 (two) times daily as needed for nausea (migraines).    Historical Provider, MD  rizatriptan (MAXALT-MLT) 10 MG disintegrating tablet Take 10 mg by  mouth daily as needed for migraine. May repeat in 2 hours if needed for migraines 12/09/12   [name], MD   BP 124/83 mmHg  Pulse 101  Temp(Src) 98.3 F (36.8 C)  Resp 20  Wt 330 lb (149.687 kg)  SpO2 95%  LMP 05/11/2014 Physical Exam  Constitutional: She is oriented to person, place, and time. She appears well-developed and well-nourished.  HENT:  Head: Normocephalic and atraumatic.  Eyes: Pupils are equal, round, and reactive to light.  Neck: Normal range of motion. Neck supple.  Cardiovascular: Normal rate and regular rhythm.   Pulmonary/Chest: Effort normal and breath sounds normal.  Abdominal: Soft. Bowel sounds are normal. There is no tenderness. There is no rebound and no guarding.  Musculoskeletal: Normal range of motion.  No reproducible neck or scalp tenderness.   Neurological: She is alert and oriented to person, place, and time. She has normal strength. No sensory deficit. She displays a negative Romberg sign. Coordination normal.  Skin: Skin is warm and dry. No rash noted.  Psychiatric: She has a normal mood and affect.    ED Course  Procedures (including critical care time) Labs Review Labs Reviewed - No data to display  Imaging Review No results found.  Ct Head Wo  Contrast  07/30/2014   CLINICAL DATA:  Headache  EXAM: CT HEAD WITHOUT CONTRAST  TECHNIQUE: Contiguous axial images were obtained from the base of the skull through the vertex without intravenous contrast.  COMPARISON:  09/19/2009  FINDINGS: Skull and Sinuses:Negative for fracture or destructive process. The mastoids, middle ears, and imaged paranasal sinuses are clear.  Orbits: No acute abnormality.  Brain: No evidence of acute infarction, hemorrhage, hydrocephalus, or mass lesion/mass effect. Dilated perivascular space along the lower right sylvian fissure.  IMPRESSION: Negative head CT.   Electronically Signed   By: Tiburcio Pea M.D.   On: 07/30/2014 05:20    MDM   Final diagnoses:  Headache    No neurologic abnormalities. Negative head CT, symptoms atypical for stroke, or bleed.  She remains alert and oriented. Toradol without improvement. Will order IM Dilaudid and re-evaluate.   Patient can be discharged home and will follow up with PCP for recheck if symptoms persist.   Arnoldo Hooker, PA-C 07/30/14 [id number]  Vanetta Mulders, MD 08/04/14 [phone]

## 2014-07-30 NOTE — ED Notes (Signed)
Pt states that she has sharp shooting pain from her neck through the rt side of her head; pt c/o swelling to rt side of head behind ear; pt also c/o intermittent swelling to left side of face; pt denies Headache,c/o mild blurry vision; No LOC; denies injury

## 2014-07-30 NOTE — Discharge Instructions (Signed)

## 2014-08-05 ENCOUNTER — Telehealth: Payer: Self-pay | Admitting: Hematology & Oncology

## 2014-08-05 ENCOUNTER — Ambulatory Visit: Payer: Medicaid Other | Admitting: Hematology & Oncology

## 2014-08-05 ENCOUNTER — Other Ambulatory Visit: Payer: Medicaid Other | Admitting: Lab

## 2014-08-05 NOTE — Telephone Encounter (Signed)
Patient called and cx apt today due to being sick.  She stated she will call back to resch.  Rn was notified of cx apt also

## 2014-09-03 ENCOUNTER — Other Ambulatory Visit: Payer: Self-pay | Admitting: Family

## 2014-09-28 IMAGING — CR DG CHEST 2V
2 series · 2 of 2 positions shown · non-contrast
Comparison: 11/24/2012.

CLINICAL DATA: Shortness of breath and cough.

CHEST - 2 VIEW

[w chest pa]
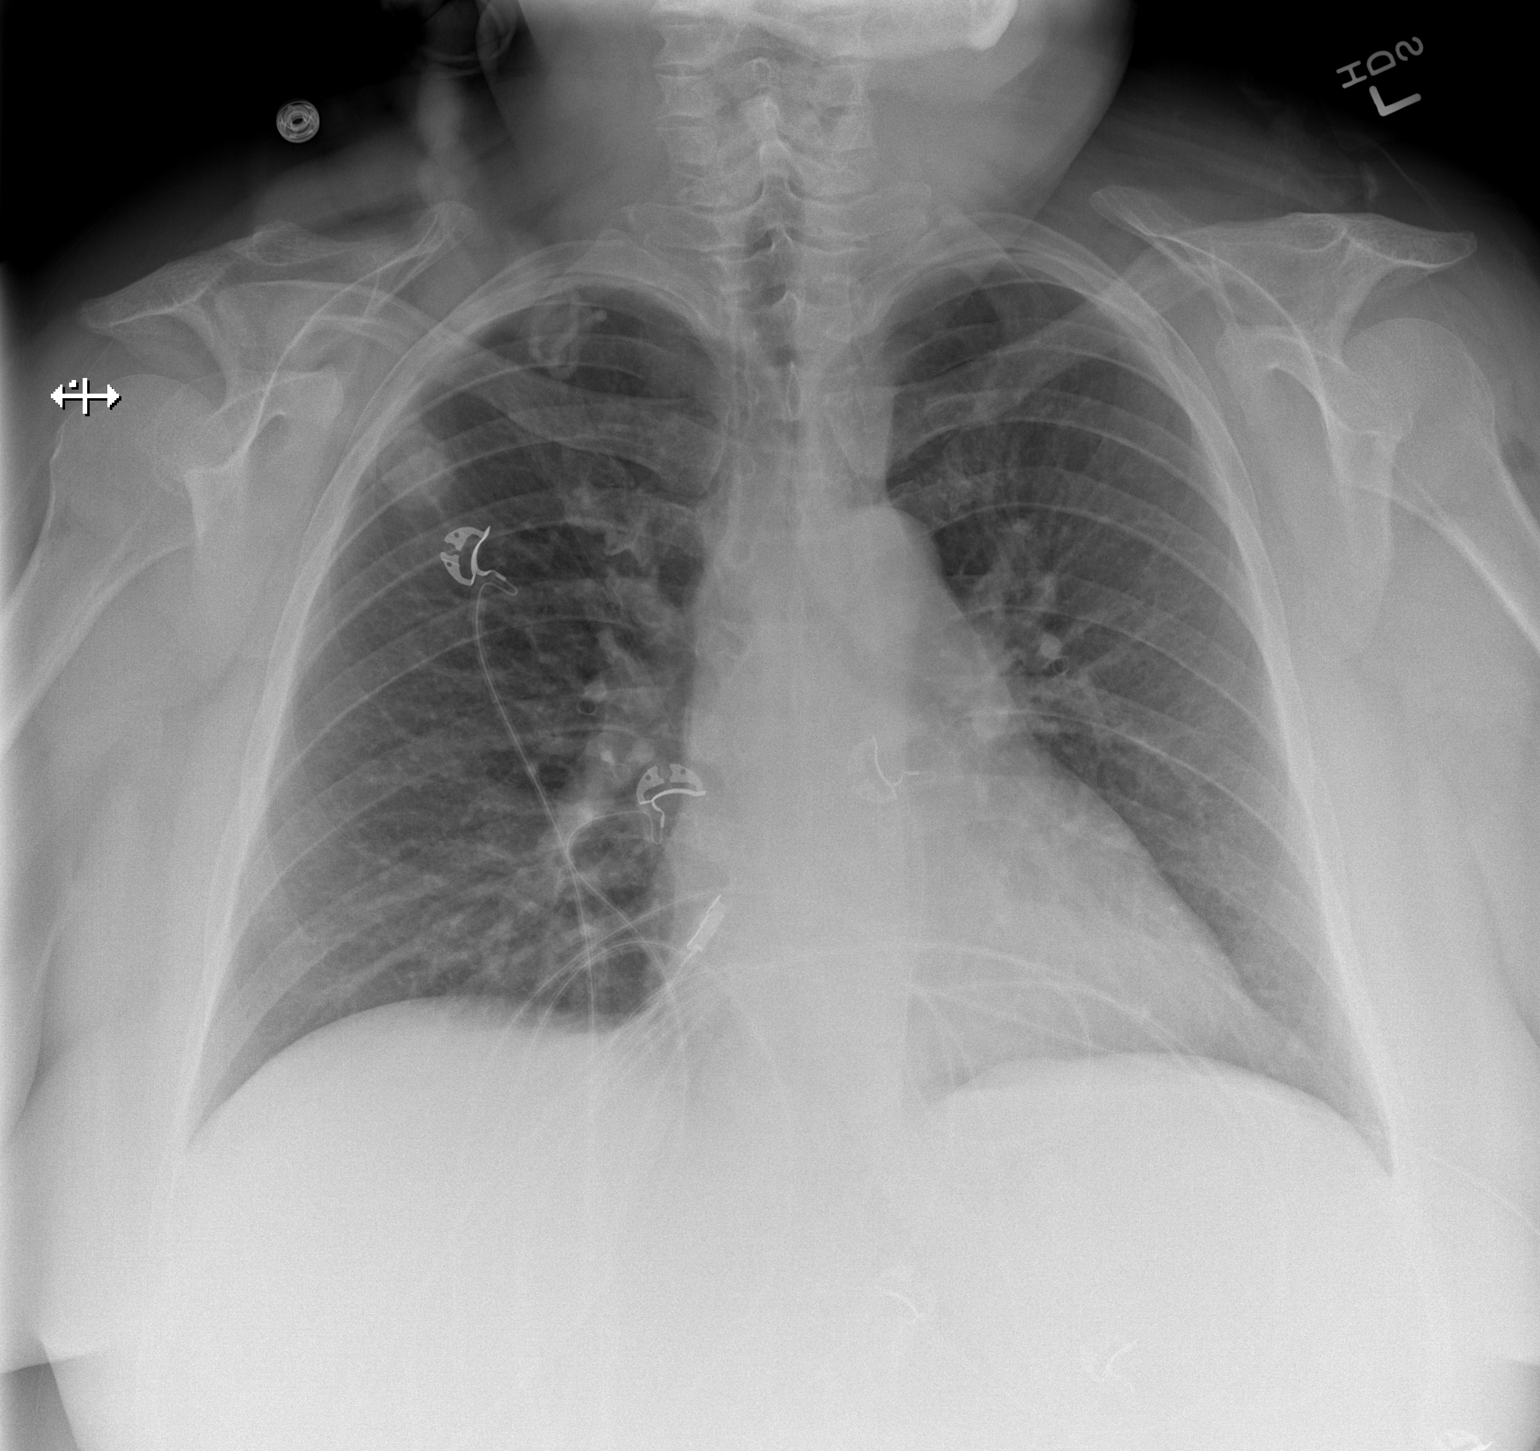

[w chest lat]
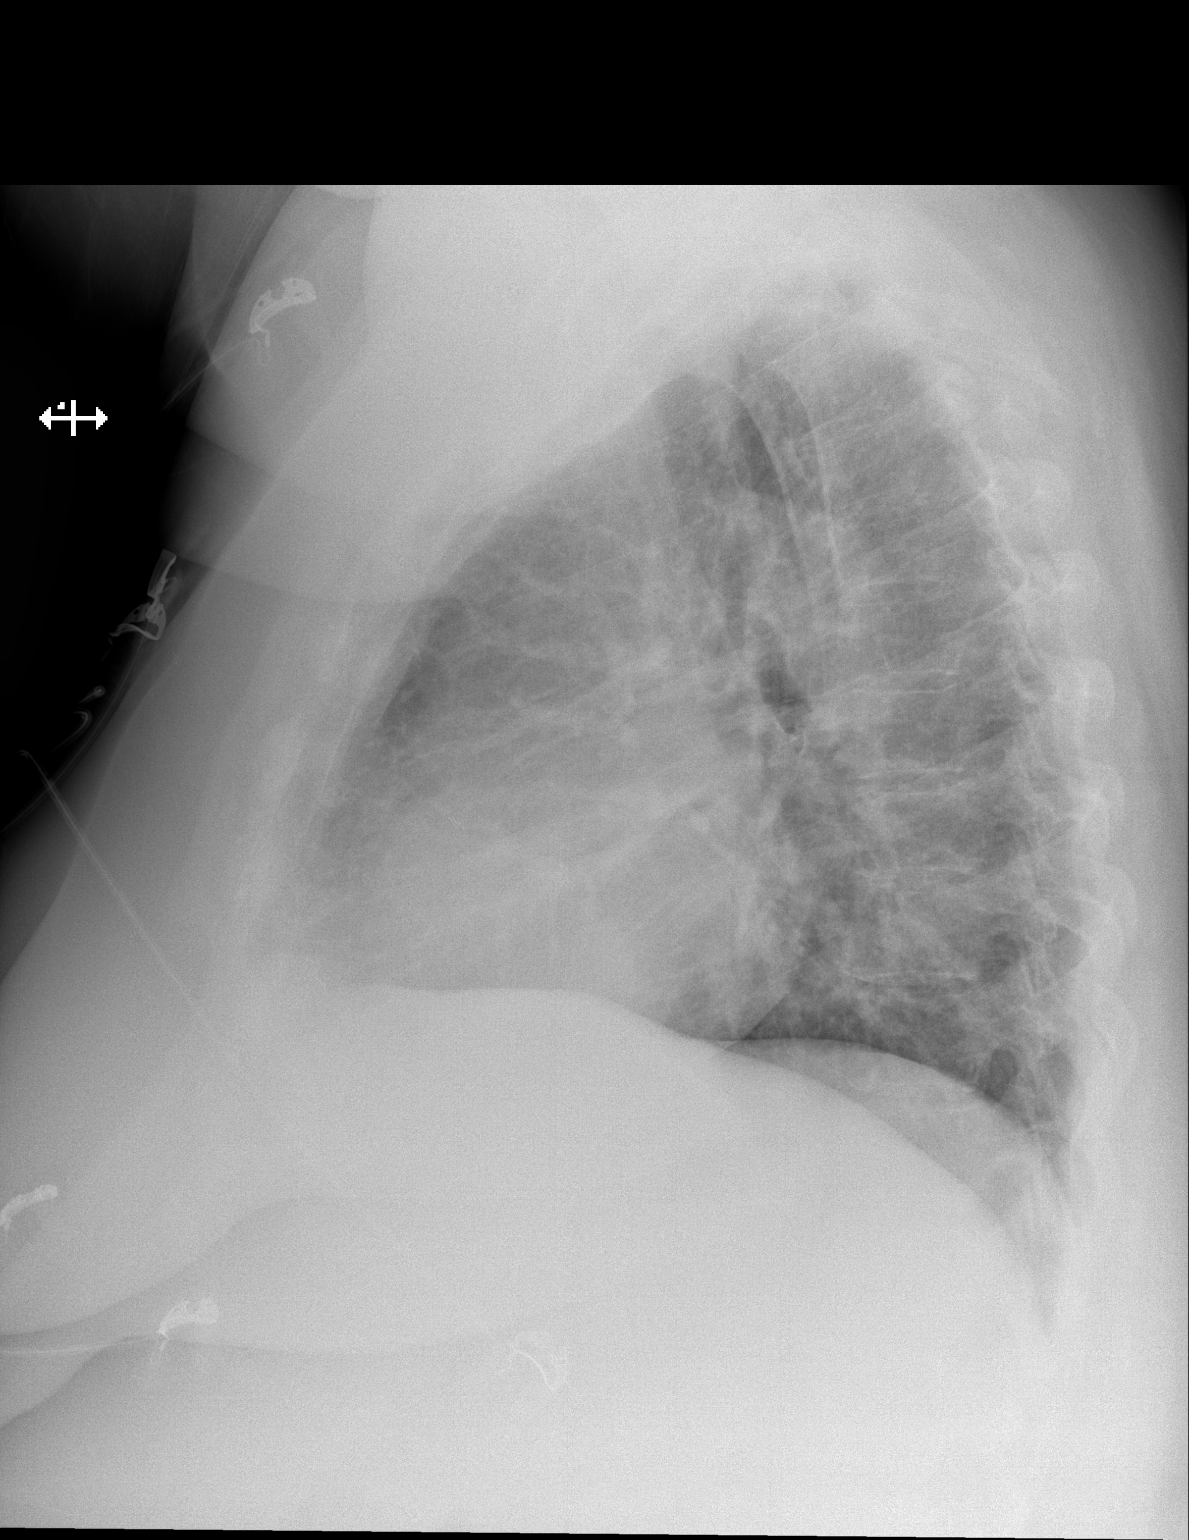

[2 of 2 positions shown; findings below may reference images not displayed]

FINDINGS: The cardiac silhouette, mediastinal and hilar contours
are within normal limits and stable.  The lungs are clear.  No
pleural effusion or pneumothorax.  The bony thorax is intact.
There is artifact overlying the right upper chest.
IMPRESSION: No acute cardiopulmonary findings.

## 2015-01-08 ENCOUNTER — Emergency Department (HOSPITAL_COMMUNITY)
Admission: EM | Admit: 2015-01-08 | Discharge: 2015-01-09 | Disposition: A | Discharge status: Other Acute Inpatient Hospital | Payer: Medicaid Other | Attending: Emergency Medicine | Admitting: Emergency Medicine

## 2015-01-08 DIAGNOSIS — F319 Bipolar disorder, unspecified: Secondary | ICD-10-CM | POA: Insufficient documentation

## 2015-01-08 DIAGNOSIS — F99 Mental disorder, not otherwise specified: Secondary | ICD-10-CM | POA: Insufficient documentation

## 2015-01-08 DIAGNOSIS — Z862 Personal history of diseases of the blood and blood-forming organs and certain disorders involving the immune mechanism: Secondary | ICD-10-CM | POA: Diagnosis not present

## 2015-01-08 DIAGNOSIS — Z91040 Latex allergy status: Secondary | ICD-10-CM | POA: Insufficient documentation

## 2015-01-08 DIAGNOSIS — M797 Fibromyalgia: Secondary | ICD-10-CM | POA: Diagnosis not present

## 2015-01-08 DIAGNOSIS — Z79899 Other long term (current) drug therapy: Secondary | ICD-10-CM | POA: Diagnosis not present

## 2015-01-08 DIAGNOSIS — Z9981 Dependence on supplemental oxygen: Secondary | ICD-10-CM | POA: Diagnosis not present

## 2015-01-08 DIAGNOSIS — Z7951 Long term (current) use of inhaled steroids: Secondary | ICD-10-CM | POA: Insufficient documentation

## 2015-01-08 DIAGNOSIS — Z87891 Personal history of nicotine dependence: Secondary | ICD-10-CM | POA: Diagnosis not present

## 2015-01-08 DIAGNOSIS — R45851 Suicidal ideations: Secondary | ICD-10-CM

## 2015-01-08 DIAGNOSIS — J45909 Unspecified asthma, uncomplicated: Secondary | ICD-10-CM | POA: Insufficient documentation

## 2015-01-08 DIAGNOSIS — F419 Anxiety disorder, unspecified: Secondary | ICD-10-CM | POA: Insufficient documentation

## 2015-01-08 DIAGNOSIS — F1310 Sedative, hypnotic or anxiolytic abuse, uncomplicated: Secondary | ICD-10-CM | POA: Diagnosis not present

## 2015-01-08 DIAGNOSIS — G4733 Obstructive sleep apnea (adult) (pediatric): Secondary | ICD-10-CM | POA: Diagnosis not present

## 2015-01-08 LAB — CBC WITH DIFFERENTIAL/PLATELET
Basophils Absolute: 0 10*3/uL (ref 0.0–0.1)
Basophils Relative: 0 % (ref 0–1)
Eosinophils Absolute: 0.2 10*3/uL (ref 0.0–0.7)
Eosinophils Relative: 2 % (ref 0–5)
HCT: 41.6 % (ref 36.0–46.0)
HEMOGLOBIN: 12.8 g/dL (ref 12.0–15.0)
LYMPHS ABS: 1 10*3/uL (ref 0.7–4.0)
LYMPHS PCT: 9 % — AB (ref 12–46)
MCH: 27.2 pg (ref 26.0–34.0)
MCHC: 30.8 g/dL (ref 30.0–36.0)
MCV: 88.5 fL (ref 78.0–100.0)
MONO ABS: 0.5 10*3/uL (ref 0.1–1.0)
Monocytes Relative: 4 % (ref 3–12)
NEUTROS ABS: 9.8 10*3/uL — AB (ref 1.7–7.7)
Neutrophils Relative %: 85 % — ABNORMAL HIGH (ref 43–77)
Platelets: 358 10*3/uL (ref 150–400)
RBC: 4.7 MIL/uL (ref 3.87–5.11)
RDW: 14.8 % (ref 11.5–15.5)
WBC: 11.5 10*3/uL — AB (ref 4.0–10.5)

## 2015-01-08 MED ORDER — ONDANSETRON HCL 4 MG PO TABS: 4.0000 mg | ORAL_TABLET | Freq: Three times a day (TID) | ORAL | Status: DC | PRN

## 2015-01-08 MED ORDER — ZOLPIDEM TARTRATE 5 MG PO TABS: 5.0000 mg | ORAL_TABLET | Freq: Every evening | ORAL | Status: DC | PRN

## 2015-01-08 MED ORDER — LORAZEPAM 1 MG PO TABS: 1.0000 mg | ORAL_TABLET | Freq: Three times a day (TID) | ORAL | Status: DC | PRN

## 2015-01-08 MED ORDER — ACETAMINOPHEN 325 MG PO TABS: 650.0000 mg | ORAL_TABLET | ORAL | Status: DC | PRN

## 2015-01-08 MED ORDER — NICOTINE 21 MG/24HR TD PT24: 21.0000 mg | MEDICATED_PATCH | Freq: Every day | TRANSDERMAL | Status: DC

## 2015-01-08 NOTE — ED Provider Notes (Signed)
CSN: [id number]     Arrival date & time 01/08/15  2314 History   This chart was scribed for Stacey Madura, PA-C working with [name], MD by [name], ED Scribe. This patient was seen in room WA12/WA12 and the patient's care was started at 11:22 PM.   Chief Complaint  Patient presents with  . Suicidal   The history is provided by the patient. No language interpreter was used.   HPI Comments: Stacey [name] is a 49 y.o. female with PMHx of anxiety and Bipolar 1 disorder who presents to the Emergency Department reporting suicidal ideation after her husband upset her tonight. Patient shares that her husband has been behaving strangely all week and not sleeping in their bed. Tonight she asked him what was wrong and he tells her "he doesn't love her anymore" after 20 years of marriage. Patient's plan to harm herself includes cutting or prescription med overdose. Patient shares history suicide attempts in her youth. Patient currently seeing a therapist for treatment and taking psych meds as directed. Patient denies homicidal ideation.   Past Medical History  Diagnosis Date  . Asthma   . Allergic rhinitis, cause unspecified   . Bipolar 1 disorder   . Morbid obesity   . Thyroid nodule   . Mental disorder   . 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 Pt has never been tested.   . Anxiety   . OSA (obstructive sleep apnea)     uses cpap does not know settings  . Complication of anesthesia 2004    bp dropped after c section, took 24hrs to feels leg , numbing medicine does not last long when injected into skin  . Difficult intravenous access     hard stick for ivs and lab per pt  . Nasal bone fracture age 20    left side cannot place tube in nostril per pt  . Iron deficiency anemia, unspecified 02/24/2013  . Pernicious anemia 02/24/2013  . Anxiety   . Fibromyalgia    Past Surgical History  Procedure Laterality Date  . Tubal ligation    . Cesarean section    . Noraplant    .  Wisdom tooth extraction    . Esophagogastroduodenoscopy N/A 01/02/2013    Procedure: ESOPHAGOGASTRODUODENOSCOPY (EGD);  Surgeon: Louis Meckel, MD;  Location: [facility];  Service: Endoscopy;  Laterality: N/A;  . Colonoscopy with propofol N/A 01/02/2013    Procedure: COLONOSCOPY WITH PROPOFOL;  Surgeon: Louis Meckel, MD;  Location: [facility];  Service: Endoscopy;  Laterality: N/A;   Family History  Problem Relation Age of Onset  . Breast cancer Mother   . Asthma Mother   . Emphysema Paternal Grandmother   . Diabetes Father    History  Substance Use Topics  . Smoking status: Former Smoker -- 0.50 packs/day for 20 years    Types: Cigarettes    Start date: 02/05/1987    Quit date: 07/30/2006  . Smokeless tobacco: Never Used     Comment: quit 7 years ago  . Alcohol Use: Yes     Comment: socially   OB History    Gravida Para Term Preterm AB TAB SAB Ectopic Multiple Living   Review of Systems  Constitutional: Negative for fever and chills.  Psychiatric/Behavioral: Positive for suicidal ideas and behavioral problems.  All other systems reviewed and are negative.     Allergies  Bupropion hcl; Capzasin;  Citalopram hydrobromide; Other; Stevia; Strawberry; Lamictal; and Latex  Home Medications   Prior to Admission medications   Medication Sig Start Date End Date Taking? Authorizing Provider  albuterol (PROVENTIL HFA;VENTOLIN HFA) 108 (90 BASE) MCG/ACT inhaler Inhale 2 puffs into the lungs 3 (three) times daily.    Yes Historical Provider, MD  aspirin 325 MG tablet Take 650 mg by mouth 3 (three) times daily.   Yes Historical Provider, MD  beclomethasone (QVAR) 80 MCG/ACT inhaler Inhale 2 puffs into the lungs 2 (two) times daily.    Yes Historical Provider, MD  clonazePAM (KLONOPIN) 1 MG tablet Take 1 mg by mouth 2 (two) times daily.   Yes Historical Provider, MD  diphenhydrAMINE (BENADRYL) 25 MG tablet Take 25 mg by mouth every 6 (six) hours as needed  for itching or allergies.   Yes Historical Provider, MD  FLUoxetine (PROZAC) 40 MG capsule Take 80 mg by mouth every evening.    Yes Historical Provider, MD  gabapentin (NEURONTIN) 300 MG capsule Take 900 mg by mouth 3 (three) times daily.   Yes Historical Provider, MD  methocarbamol (ROBAXIN) 500 MG tablet Take 1,000 mg by mouth every 8 (eight) hours as needed for muscle spasms.    Yes Historical Provider, MD  omeprazole (PRILOSEC) 40 MG capsule Take 40 mg by mouth daily.   Yes Historical Provider, MD  traMADol-acetaminophen (ULTRACET) 37.5-325 MG per tablet Take 1 tablet by mouth every 8 (eight) hours as needed for moderate pain.    Yes Historical Provider, MD  ziprasidone (GEODON) 60 MG capsule Take 120 mg by mouth at bedtime. Take 2 capsules daily   Yes Historical Provider, MD  EPINEPHrine (EPI-PEN) 0.3 mg/0.3 mL DEVI Inject 0.3 mg into the muscle as needed (for allergic reaction).     Historical Provider, MD  ondansetron (ZOFRAN) 4 MG tablet Take 4 mg by mouth 2 (two) times daily as needed for nausea (migraines).    Historical Provider, MD  rizatriptan (MAXALT-MLT) 10 MG disintegrating tablet Take 10 mg by mouth daily as needed for migraine. May repeat in 2 hours if needed for migraines 12/09/12   [name], MD   Triage Vitals: BP 188/96 mmHg  Pulse 118  Temp(Src) 98.3 F (36.8 C) (Oral)  Resp 18  SpO2 98% Physical Exam  Constitutional: She is oriented to person, place, and time. She appears well-developed and well-nourished. No distress.  HENT:  Head: Normocephalic and atraumatic.  Eyes: Conjunctivae and EOM are normal. No scleral icterus.  Neck: Normal range of motion.  Pulmonary/Chest: Effort normal. No respiratory distress.  Musculoskeletal: Normal range of motion.  Neurological: She is alert and oriented to person, place, and time.  Skin: Skin is warm and dry. No rash noted. She is not diaphoretic. No erythema. No pallor.  Psychiatric: Her speech is normal. Her mood appears  anxious. She exhibits a depressed mood. She expresses suicidal ideation. She expresses no homicidal ideation. She expresses suicidal plans. She expresses no homicidal plans.  Patient tearful  Nursing note and vitals reviewed.   ED Course  Procedures (including critical care time)  COORDINATION OF CARE: 11:31 PM- Discussed treatment plan with patient at bedside and patient agreed to plan.   Labs Review Labs Reviewed  CBC WITH DIFFERENTIAL/PLATELET - Abnormal; Notable for the following:    WBC 11.5 (*)    Neutrophils Relative % 85 (*)    Neutro Abs 9.8 (*)    Lymphocytes Relative 9 (*)    All other components within normal limits  COMPREHENSIVE METABOLIC PANEL - Abnormal; Notable for the following:    Glucose, Bld 131 (*)    Total Protein 8.7 (*)    Alkaline Phosphatase 142 (*)    Total Bilirubin 0.1 (*)    All other components within normal limits  URINE RAPID DRUG SCREEN, HOSP PERFORMED - Abnormal; Notable for the following:    Benzodiazepines POSITIVE (*)    All other components within normal limits  ACETAMINOPHEN LEVEL - Abnormal; Notable for the following:    Acetaminophen (Tylenol), Serum <10 (*)    All other components within normal limits  ETHANOL  SALICYLATE LEVEL    Imaging Review No results found.   EKG Interpretation None      MDM   Final diagnoses:  Suicidal ideations    49 year old female presents to the emergency department for further evaluation of suicidal ideations. Patient accepted at behavioral health for transfer after 8 AM. Labs reviewed and patient medically cleared. Disposition to be set by oncoming ED provider at time of transfer.  I personally performed the services described in this documentation, which was scribed in my presence. The recorded information has been reviewed and is accurate.   Filed Vitals:   01/08/15 2320 01/09/15 0117  BP: 188/96   Pulse: 118 85  Temp: 98.3 F (36.8 C)   TempSrc: Oral   Resp: 18   SpO2: 98% 99%       Stacey Madura, PA-C 01/09/15 4944  [name], MD 01/09/15 [phone]

## 2015-01-08 NOTE — ED Notes (Signed)
TTS at bedside. 

## 2015-01-08 NOTE — BH Assessment (Signed)
Reviewed ED notes prior to initiating assessment. Per Dr. Wonda Amis note pt has hx of bipolar and presents tonight with SI with planning after her SO told her he doesn't love her anymore.  Assessment to commence shortly.    Clista Bernhardt, [facility] Triage Specialist 01/08/2015 11:34 PM

## 2015-01-09 ENCOUNTER — Encounter (HOSPITAL_COMMUNITY): Payer: Self-pay | Admitting: Emergency Medicine

## 2015-01-09 ENCOUNTER — Inpatient Hospital Stay (HOSPITAL_COMMUNITY)
Admission: AD | Admit: 2015-01-09 | Discharge: 2015-01-21 | DRG: 885 | Disposition: A | Discharge status: Home / Self Care | Payer: Medicaid Other | Source: Intra-hospital | Attending: Psychiatry | Admitting: Psychiatry

## 2015-01-09 ENCOUNTER — Encounter (HOSPITAL_COMMUNITY): Payer: Self-pay

## 2015-01-09 DIAGNOSIS — Z87891 Personal history of nicotine dependence: Secondary | ICD-10-CM | POA: Diagnosis not present

## 2015-01-09 DIAGNOSIS — Z6841 Body Mass Index (BMI) 40.0 and over, adult: Secondary | ICD-10-CM | POA: Diagnosis not present

## 2015-01-09 DIAGNOSIS — R45851 Suicidal ideations: Secondary | ICD-10-CM | POA: Diagnosis not present

## 2015-01-09 DIAGNOSIS — Z79899 Other long term (current) drug therapy: Secondary | ICD-10-CM | POA: Diagnosis not present

## 2015-01-09 DIAGNOSIS — F314 Bipolar disorder, current episode depressed, severe, without psychotic features: Secondary | ICD-10-CM | POA: Diagnosis not present

## 2015-01-09 DIAGNOSIS — F410 Panic disorder [episodic paroxysmal anxiety] without agoraphobia: Secondary | ICD-10-CM | POA: Diagnosis present

## 2015-01-09 DIAGNOSIS — G4733 Obstructive sleep apnea (adult) (pediatric): Secondary | ICD-10-CM | POA: Diagnosis present

## 2015-01-09 DIAGNOSIS — F32A Depression, unspecified: Secondary | ICD-10-CM | POA: Diagnosis present

## 2015-01-09 DIAGNOSIS — F319 Bipolar disorder, unspecified: Principal | ICD-10-CM | POA: Diagnosis present

## 2015-01-09 DIAGNOSIS — F329 Major depressive disorder, single episode, unspecified: Secondary | ICD-10-CM | POA: Diagnosis present

## 2015-01-09 LAB — COMPREHENSIVE METABOLIC PANEL
ALBUMIN: 4.1 g/dL (ref 3.5–5.0)
ALK PHOS: 142 U/L — AB (ref 38–126)
ALT: 39 U/L (ref 14–54)
AST: 29 U/L (ref 15–41)
Anion gap: 9 (ref 5–15)
BUN: 12 mg/dL (ref 6–20)
CO2: 28 mmol/L (ref 22–32)
CREATININE: 0.56 mg/dL (ref 0.44–1.00)
Calcium: 9.3 mg/dL (ref 8.9–10.3)
Chloride: 101 mmol/L (ref 101–111)
GFR calc Af Amer: >60 mL/min (ref >60)
GFR calc non Af Amer: >60 mL/min (ref >60)
Glucose, Bld: 131 mg/dL — ABNORMAL HIGH (ref 65–99)
POTASSIUM: 3.7 mmol/L (ref 3.5–5.1)
Sodium: 138 mmol/L (ref 135–145)
Total Bilirubin: 0.1 mg/dL — ABNORMAL LOW (ref 0.3–1.2)
Total Protein: 8.7 g/dL — ABNORMAL HIGH (ref 6.5–8.1)

## 2015-01-09 LAB — SALICYLATE LEVEL: Salicylate Lvl: <4 mg/dL (ref 2.8–30.0)

## 2015-01-09 LAB — GLUCOSE, CAPILLARY
GLUCOSE-CAPILLARY: 115 mg/dL — AB (ref 65–99)
Glucose-Capillary: 121 mg/dL — ABNORMAL HIGH (ref 65–99)

## 2015-01-09 LAB — RAPID URINE DRUG SCREEN, HOSP PERFORMED
Amphetamines: NOT DETECTED
BENZODIAZEPINES: POSITIVE — AB
Barbiturates: NOT DETECTED
COCAINE: NOT DETECTED
OPIATES: NOT DETECTED
Tetrahydrocannabinol: NOT DETECTED

## 2015-01-09 LAB — ACETAMINOPHEN LEVEL

## 2015-01-09 LAB — PREGNANCY, URINE: Preg Test, Ur: NEGATIVE

## 2015-01-09 LAB — ETHANOL

## 2015-01-09 MED ORDER — ASPIRIN 325 MG PO TABS
650.0000 mg | ORAL_TABLET | Freq: Three times a day (TID) | ORAL | Status: DC
  Administered 2015-01-09 – 2015-01-10 (×3): 650 mg via ORAL
  Filled 2015-01-09 (×10): qty 2

## 2015-01-09 MED ORDER — DIPHENHYDRAMINE HCL 25 MG PO CAPS
25.0000 mg | ORAL_CAPSULE | Freq: Four times a day (QID) | ORAL | Status: DC | PRN
  Administered 2015-01-15 – 2015-01-16 (×2): 25 mg via ORAL
  Filled 2015-01-09 (×2): qty 1

## 2015-01-09 MED ORDER — ALUM & MAG HYDROXIDE-SIMETH 200-200-20 MG/5ML PO SUSP: 30.0000 mL | ORAL | Status: DC | PRN

## 2015-01-09 MED ORDER — METHOCARBAMOL 500 MG PO TABS
1000.0000 mg | ORAL_TABLET | Freq: Three times a day (TID) | ORAL | Status: DC | PRN
  Administered 2015-01-11 – 2015-01-20 (×16): 1000 mg via ORAL
  Filled 2015-01-09 (×19): qty 2

## 2015-01-09 MED ORDER — PANTOPRAZOLE SODIUM 40 MG PO TBEC
40.0000 mg | DELAYED_RELEASE_TABLET | Freq: Every day | ORAL | Status: DC
  Filled 2015-01-09 (×2): qty 1

## 2015-01-09 MED ORDER — HYDROXYZINE HCL 25 MG PO TABS
25.0000 mg | ORAL_TABLET | Freq: Three times a day (TID) | ORAL | Status: DC | PRN
  Administered 2015-01-10 – 2015-01-17 (×9): 25 mg via ORAL
  Filled 2015-01-09 (×10): qty 1
  Filled 2015-01-09: qty 10
  Filled 2015-01-09: qty 1

## 2015-01-09 MED ORDER — ALBUTEROL SULFATE HFA 108 (90 BASE) MCG/ACT IN AERS
2.0000 | INHALATION_SPRAY | Freq: Three times a day (TID) | RESPIRATORY_TRACT | Status: DC
Start: 2015-01-09 — End: 2015-01-09
  Filled 2015-01-09: qty 6.7

## 2015-01-09 MED ORDER — BUDESONIDE 0.25 MG/2ML IN SUSP
0.2500 mg | Freq: Two times a day (BID) | RESPIRATORY_TRACT | Status: AC
  Administered 2015-01-09: 0.25 mg via RESPIRATORY_TRACT
  Filled 2015-01-09 (×2): qty 2

## 2015-01-09 MED ORDER — PANTOPRAZOLE SODIUM 40 MG PO TBEC
40.0000 mg | DELAYED_RELEASE_TABLET | Freq: Every day | ORAL | Status: DC
  Administered 2015-01-10 – 2015-01-21 (×12): 40 mg via ORAL
  Filled 2015-01-09 (×15): qty 1

## 2015-01-09 MED ORDER — ALBUTEROL SULFATE HFA 108 (90 BASE) MCG/ACT IN AERS: 2.0000 | INHALATION_SPRAY | Freq: Three times a day (TID) | RESPIRATORY_TRACT | Status: DC | PRN

## 2015-01-09 MED ORDER — MAGNESIUM HYDROXIDE 400 MG/5ML PO SUSP: 30.0000 mL | Freq: Every day | ORAL | Status: DC | PRN

## 2015-01-09 MED ORDER — INSULIN ASPART 100 UNIT/ML ~~LOC~~ SOLN: 0.0000 [IU] | Freq: Three times a day (TID) | SUBCUTANEOUS | Status: DC

## 2015-01-09 MED ORDER — BECLOMETHASONE DIPROPIONATE 80 MCG/ACT IN AERS
2.0000 | INHALATION_SPRAY | Freq: Two times a day (BID) | RESPIRATORY_TRACT | Status: DC
  Administered 2015-01-10 – 2015-01-21 (×23): 2 via RESPIRATORY_TRACT
  Filled 2015-01-09 (×2): qty 8.7

## 2015-01-09 MED ORDER — TRAZODONE HCL 50 MG PO TABS
50.0000 mg | ORAL_TABLET | Freq: Every evening | ORAL | Status: DC | PRN
  Administered 2015-01-10 – 2015-01-20 (×11): 50 mg via ORAL
  Filled 2015-01-09: qty 1
  Filled 2015-01-09: qty 3
  Filled 2015-01-09 (×10): qty 1

## 2015-01-09 MED ORDER — CLONAZEPAM 1 MG PO TABS
1.0000 mg | ORAL_TABLET | Freq: Two times a day (BID) | ORAL | Status: DC | PRN
  Administered 2015-01-09 – 2015-01-10 (×2): 1 mg via ORAL
  Filled 2015-01-09 (×2): qty 1

## 2015-01-09 MED ORDER — ACETAMINOPHEN 325 MG PO TABS
650.0000 mg | ORAL_TABLET | Freq: Four times a day (QID) | ORAL | Status: DC | PRN
  Administered 2015-01-10 – 2015-01-15 (×2): 650 mg via ORAL
  Filled 2015-01-09 (×2): qty 2

## 2015-01-09 MED ORDER — INSULIN ASPART 100 UNIT/ML ~~LOC~~ SOLN: 4.0000 [IU] | Freq: Three times a day (TID) | SUBCUTANEOUS | Status: DC

## 2015-01-09 MED ORDER — GABAPENTIN 300 MG PO CAPS
900.0000 mg | ORAL_CAPSULE | Freq: Three times a day (TID) | ORAL | Status: DC
  Administered 2015-01-09 – 2015-01-21 (×36): 900 mg via ORAL
  Filled 2015-01-09 (×11): qty 3
  Filled 2015-01-09 (×2): qty 27
  Filled 2015-01-09 (×19): qty 3
  Filled 2015-01-09: qty 27
  Filled 2015-01-09 (×10): qty 3

## 2015-01-09 MED ORDER — DIVALPROEX SODIUM 500 MG PO DR TAB
500.0000 mg | DELAYED_RELEASE_TABLET | Freq: Two times a day (BID) | ORAL | Status: DC
  Administered 2015-01-09 – 2015-01-21 (×24): 500 mg via ORAL
  Filled 2015-01-09 (×13): qty 1
  Filled 2015-01-09: qty 6
  Filled 2015-01-09 (×4): qty 1
  Filled 2015-01-09: qty 6
  Filled 2015-01-09 (×10): qty 1

## 2015-01-09 MED ORDER — ZIPRASIDONE HCL 60 MG PO CAPS
120.0000 mg | ORAL_CAPSULE | Freq: Every day | ORAL | Status: DC
  Administered 2015-01-09: 120 mg via ORAL
  Filled 2015-01-09 (×4): qty 2

## 2015-01-09 NOTE — ED Notes (Signed)
Pt ambulated to bathroom without assistance 

## 2015-01-09 NOTE — Progress Notes (Signed)
[facility] Group Notes:  (Nursing/MHT/Case Management/Adjunct)  Date:  01/09/2015  Time:  9:54 PM  Type of Therapy:  Psychoeducational Skills  Participation Level:  Active  Participation Quality:  Attentive  Affect:  Depressed and Flat  Cognitive:  Appropriate  Insight:  Appropriate  Engagement in Group:  Developing/Improving  Modes of Intervention:  Education  Summary of Progress/Problems: Patient acknowledged that she initially had a rough start to her day, but saw some improvement later in the day. She attributes her positive evening to having a good visit with two of her sons. As a theme for the day, her support system will be comprised of her sons.   Hazle Coca S 01/09/2015, 9:54 PM

## 2015-01-09 NOTE — ED Notes (Signed)
Staffing called for sitter.   

## 2015-01-09 NOTE — Tx Team (Addendum)
Initial Interdisciplinary Treatment Plan   PATIENT STRESSORS: Marital or family conflict   PATIENT STRENGTHS: Ability for insight Average or above average intelligence Financial means General fund of knowledge   PROBLEM LIST: Problem List/Patient Goals Date to be addressed Date deferred Reason deferred Estimated date of resolution  Depressed Mood      Anxiety      Marital Conflict      Suicidal Ideation            "I need help dealing with my husband leaving"                         DISCHARGE CRITERIA:  Ability to meet basic life and health needs Improved stabilization in mood, thinking, and/or behavior Medical problems require only outpatient monitoring Need for constant or close observation no longer present Reduction of life-threatening or endangering symptoms to within safe limits Safe-care adequate arrangements made Verbal commitment to aftercare and medication compliance  PRELIMINARY DISCHARGE PLAN: Attend aftercare/continuing care group Outpatient therapy Participate in family therapy  PATIENT/FAMIILY INVOLVEMENT: This treatment plan has been presented to and reviewed with the patient, Stacey [name].  The patient and family have been given the opportunity to ask questions and make suggestions.  Alfonse Spruce 01/09/2015, 9:34 AM

## 2015-01-09 NOTE — [facility] Group Notes (Signed)
[facility] Group Notes:  (Clinical Social Work)  01/09/2015  1:15-2:15PM  Summary of Progress/Problems:   The main focus of today's process group was to   1)  discuss the importance of adding supports  2)  identify potential healthy supports and   3)  plan what to add  An emphasis was placed on using counselor, doctor, therapy groups, 12-step groups, and problem-specific support groups to expand supports.    The patient expressed full comprehension of the concepts presented, and agreed that there is a need to add more supports.  The patient stated little in group, was obviously anxious, rocking a little and moving her feet constantly, not making eye contact with CSW or anyone else in group.  Type of Therapy:  Process Group with Motivational Interviewing  Participation Level:  Minimal  Participation Quality:  Attentive  Affect:  Depressed and Flat  Cognitive:  Alert  Insight:  Developing/Improving  Engagement in Therapy:  Engaged  Modes of Intervention:   Education, Support and Processing, Activity  Ambrose Mantle, LCSW 01/09/2015

## 2015-01-09 NOTE — Progress Notes (Signed)
Patient ID: Stacey [name], female   DOB: Jun 18, 1966, 49 y.o.   MRN: [id number]  Pt with history of bipolar admitted from [facility] with suicidal ideation. Pt reports her husband has been acting strangely for about a week, not talking to her and sleeping in the living room. When she confronted him about it "he basically he said he doesn't love me anymore and he is tired of everything." Pt reports she knew if she stayed home she would do something dangerous. She reports she instantly began thinking out taking her pills or cutting herself to end her life. Pt reports hx of suicide attempts as a teenager and a history of cutting herself. She reports the last time she self-injured was about a year ago with a razor blade. Pt reports additional stressors include being on disability, chronic pain from fibromyalgia and arthritis. She also reports trouble with breathing at times with use of CPAP at home at night. 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 During admission pt is depressed and anxious with flat affect. Pt endorses SI and contracts for safety, denies HI/AVH presently. She reports she had AH, sometimes with command "constantly" prior to starting a medication 2 years ago. She reports AVH is now well managed and she had not had any manic episodes either. Skin/contraband search done, skin intact, no contraband found, pt cooperative during admission process, oriented to unit and rules.
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
## 2015-01-09 NOTE — ED Notes (Signed)
Gave report to [facility] Sue Lush and transport called. Pt sent over to [facility] with 2 bags of belongings.

## 2015-01-09 NOTE — Progress Notes (Signed)
Pt reports she has been "ok" today.  She was admitted this morning to the hall.  She denies SI/HI/AVH at this time.  She has asked about some of her home meds, and medications were reviewed with the pt.  Two of her sons visited with her tonight and brought her CPAP.  Pt is hopeful to learn some new coping skill to deal with her changing life situation and maybe have her medications adjusted.  Pt states she attended groups today.  Pt was encouraged to make her needs and concerns known to staff.  Support and encouragement offered.  Safety maintained with q15 minute checks.

## 2015-01-09 NOTE — ED Notes (Signed)
Per Harriett Sine with [facility] pt is accepted after 8 am at [facility]

## 2015-01-09 NOTE — ED Notes (Signed)
Sitter at bedside.

## 2015-01-09 NOTE — ED Provider Notes (Signed)
Pt signed out at shift change by [name], PA-C. Plan is for pt to be transferred to [facility] due to SI.  Results for orders placed or performed during the hospital encounter of 01/08/15  CBC with Differential  Result Value Ref Range   WBC 11.5 (H) 4.0 - 10.5 K/uL   RBC 4.70 3.87 - 5.11 MIL/uL   Hemoglobin 12.8 12.0 - 15.0 g/dL   HCT 85.5 01.5 - 86.8 %   MCV 88.5 78.0 - 100.0 fL   MCH 27.2 26.0 - 34.0 pg   MCHC 30.8 30.0 - 36.0 g/dL   RDW 25.7 49.3 - 55.2 %   Platelets 358 150 - 400 K/uL   Neutrophils Relative % 85 (H) 43 - 77 %   Neutro Abs 9.8 (H) 1.7 - 7.7 K/uL   Lymphocytes Relative 9 (L) 12 - 46 %   Lymphs Abs 1.0 0.7 - 4.0 K/uL   Monocytes Relative 4 3 - 12 %   Monocytes Absolute 0.5 0.1 - 1.0 K/uL   Eosinophils Relative 2 0 - 5 %   Eosinophils Absolute 0.2 0.0 - 0.7 K/uL   Basophils Relative 0 0 - 1 %   Basophils Absolute 0.0 0.0 - 0.1 K/uL  Comprehensive metabolic panel  Result Value Ref Range   Sodium 138 135 - 145 mmol/L   Potassium 3.7 3.5 - 5.1 mmol/L   Chloride 101 101 - 111 mmol/L   CO2 28 22 - 32 mmol/L   Glucose, Bld 131 (H) 65 - 99 mg/dL   BUN 12 6 - 20 mg/dL   Creatinine, Ser 1.74 0.44 - 1.00 mg/dL   Calcium 9.3 8.9 - 71.5 mg/dL   Total Protein 8.7 (H) 6.5 - 8.1 g/dL   Albumin 4.1 3.5 - 5.0 g/dL   AST 29 15 - 41 U/L   ALT 39 14 - 54 U/L   Alkaline Phosphatase 142 (H) 38 - 126 U/L   Total Bilirubin 0.1 (L) 0.3 - 1.2 mg/dL   GFR calc non Af Amer >60 >60 mL/min   GFR calc Af Amer >60 >60 mL/min   Anion gap 9 5 - 15  Ethanol  Result Value Ref Range   Alcohol, Ethyl (B) <5 <5 mg/dL  Urine rapid drug screen (hosp performed)  Result Value Ref Range   Opiates NONE DETECTED NONE DETECTED   Cocaine NONE DETECTED NONE DETECTED   Benzodiazepines POSITIVE (A) NONE DETECTED   Amphetamines NONE DETECTED NONE DETECTED   Tetrahydrocannabinol NONE DETECTED NONE DETECTED   Barbiturates NONE DETECTED NONE DETECTED  Acetaminophen level  Result Value Ref Range   Acetaminophen (Tylenol), Serum <10 (L) 10 - 30 ug/mL  Salicylate level  Result Value Ref Range   Salicylate Lvl <4.0 2.8 - 30.0 mg/dL   No results found.  Pt sitting up drinking Ice water. NAD. Alert to person, place and time. GCS 15. Pt making arrangements for family member to bring her home CPAP machine to [facility].   EMTALA form completed.     Junius Finner, PA-C 01/09/15 [id number]  [name], MD 01/18/15 [phone]

## 2015-01-09 NOTE — ED Notes (Signed)
Pt depressed because her husband stated he was leaving her today,  They have 6 children together,  She says she feels hopeless
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
## 2015-01-09 NOTE — ED Notes (Signed)
Stacey [name] at bedside

## 2015-01-09 NOTE — ED Notes (Signed)
Pt states she was told by her husband tonight he does not love her and does not want to be with her. Pt states she is now suicidal with attempts in the past. Plan to OD

## 2015-01-09 NOTE — [facility] Group Notes (Signed)
[facility] Group Notes:  (Nursing/MHT/Case Management/Adjunct)  Date:  01/09/2015  Time:  0900 am  Type of Therapy:  Psychoeducational Skills  Participation Level:  Did Not Attend   [place] Mon 01/09/2015, 3:01 PM
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
## 2015-01-09 NOTE — [facility] Suicide Risk Assessment (Signed)
[facility] Admission Suicide Risk Assessment   Nursing information obtained from:  Patient Demographic factors:  Caucasian Current Mental Status:  Suicidal ideation indicated by patient Loss Factors:  Loss of significant relationship Historical Factors:  Family history of mental illness or substance abuse Risk Reduction Factors:  NA Total Time spent with patient: 30 minutes Principal Problem: Bipolar I disorder, most recent episode (or current) unspecified Diagnosis:   Patient Active Problem List   Diagnosis Date Noted  . Bipolar I disorder, most recent episode (or current) unspecified [F31.9] 01/09/2015  . Panic attacks [F41.0] 01/09/2015  . Headache(784.0) [R51] 07/10/2013  . Depressive disorder [F32.9] 03/08/2013  . Aphthous ulcer [K12.0] 03/01/2013  . Cellulitis [L03.90] 03/01/2013  . Chest pain [R07.9] 02/27/2013  . Iron deficiency anemia, unspecified [D50.9] 02/24/2013  . Pernicious anemia [D51.0] 02/24/2013  . Abnormality of gait [R26.9] 12/09/2012  . Dysphagia, unspecified(787.20) [R13.10] 12/08/2012  . DUB (dysfunctional uterine bleeding) [N93.8] 05/30/2011  . Anemia [D64.9] 05/30/2011  . COUGH [R05] 03/29/2010  . SKIN RASH [R21] 09/30/2009  . ACUTE BRONCHITIS [J20.9] 08/10/2009  . ALCOHOLISM [F10.20] 08/05/2009  . DIARRHEA [R19.7] 08/05/2009  . SINUSITIS, ACUTE [J01.90] 07/06/2009  . CELIAC SPRUE [K90.0] 07/06/2009  . 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 THYROID NODULE [E04.1] 03/04/2009  . Allergic rhinitis, cause unspecified [J30.9] 01/27/2009  . OBSTRUCTIVE SLEEP APNEA [G47.30] 01/26/2009  . SHORTNESS OF BREATH (SOB) [R06.02] 12/22/2008  . FIBROMYALGIA [M79.1, M60.9] 12/13/2008     Continued Clinical Symptoms:  Alcohol Use Disorder Identification Test Final Score (AUDIT): 1 The "Alcohol Use Disorders Identification Test", Guidelines for Use in Primary Care, Second Edition.  World Science writer [facility]). Score between 0-7:  no or low risk or alcohol  related problems. Score between 8-15:  moderate risk of alcohol related problems. Score between 16-19:  high risk of alcohol related problems. Score 20 or above:  warrants further diagnostic evaluation for alcohol dependence and treatment.   CLINICAL FACTORS:   Panic Attacks More than one psychiatric diagnosis Previous Psychiatric Diagnoses and Treatments   Musculoskeletal: Strength & Muscle Tone: within normal limits Gait & Station: normal Patient leans: N/A  Psychiatric Specialty Exam: Physical Exam  Review of Systems  Psychiatric/Behavioral: Positive for depression and suicidal ideas. The patient is nervous/anxious and has insomnia.   All other systems reviewed and are negative.   Blood pressure 135/71, pulse 93, temperature 98.6 F (37 C), temperature source Oral, resp. rate 18, height 5' 8.5" (1.74 m), weight 147.873 kg (326 lb), last menstrual period 12/09/2014, SpO2 100 %.Body mass index is 48.84 kg/(m^2).  General Appearance: Disheveled  Eye Solicitor::  Fair  Speech:  Normal Rate  Volume:  Normal  Mood:  Anxious and Depressed  Affect:  Labile and Tearful  Thought Process:  Goal Directed  Orientation:  Full (Time, Place, and Person)  Thought Content:  Rumination  Suicidal Thoughts:  Yes.  without intent/plan  Homicidal Thoughts:  No  Memory:  Immediate;   Fair Recent;   Fair Remote;   Fair  Judgement:  Impaired  Insight:  Fair  Psychomotor Activity:  Normal  Concentration:  Fair  Recall:  [employer] of Knowledge:Fair  Language: Fair  Akathisia:  No  Handed:  Right  AIMS (if indicated):     Assets:  Desire for Improvement Physical Health Social Support  Sleep:     Cognition: WNL  ADL's:  Intact     COGNITIVE FEATURES THAT CONTRIBUTE TO RISK:  Closed-mindedness, Polarized thinking and Thought constriction (  tunnel vision)    SUICIDE RISK:   Moderate:  Frequent suicidal ideation with limited intensity, and duration, some specificity in terms of plans,  no associated intent, good self-control, limited dysphoria/symptomatology, some risk factors present, and identifiable protective factors, including available and accessible social support.  PLAN OF CARE: Patient will benefit from inpatient treatment and stabilization.  Estimated length of stay is 5-7 days.  Reviewed past medical records,treatment plan.   Will continue Geodon 120 mg po daily with supper for psychosis, mood sx. The last time pt had AH was two months ago. Will start Depakote DR 500 mg po bid for mood sx.Depakote level on 01/13/15. Will continue Klonopin 1 mg po , bid but make it prn for panic [place]. Discussed with pt about risk of BZD , advised tapering off. Will also provide Vistaril po prn for anxiety. Benadryl as scheduled prn for itching - pt wants benadryl.   Will continue to monitor vitals ,medication compliance and treatment side effects while patient is here.  Will monitor for medical issues as well as call consult as needed. Diabetic consult - CBG monitoring - elevated Glu.    Reviewed labs CBC, CMP,UDS, BAL - will order Hba1c, lipid panel, TSH,ekg. CSW will start working on disposition.  Patient to participate in therapeutic milieu .       Medical Decision Making:  Review of Psycho-Social Stressors (1), Review or order clinical lab tests (1), Established Problem, Worsening (2), Review of Last Therapy Session (1), Review of Medication Regimen & Side Effects (2) and Review of New Medication or Change in Dosage (2)  I certify that inpatient services furnished can reasonably be expected to improve the patient's condition.   Niurka Benecke MD 01/09/2015, 12:35 PM

## 2015-01-10 DIAGNOSIS — F319 Bipolar disorder, unspecified: Principal | ICD-10-CM

## 2015-01-10 DIAGNOSIS — R45851 Suicidal ideations: Secondary | ICD-10-CM

## 2015-01-10 LAB — LIPID PANEL
Cholesterol: 170 mg/dL (ref 0–200)
HDL: 45 mg/dL (ref >40)
LDL CALC: 103 mg/dL — AB (ref 0–99)
Total CHOL/HDL Ratio: 3.8 RATIO
Triglycerides: 110 mg/dL (ref <150)
VLDL: 22 mg/dL (ref 0–40)

## 2015-01-10 LAB — GLUCOSE, CAPILLARY
GLUCOSE-CAPILLARY: 114 mg/dL — AB (ref 65–99)
GLUCOSE-CAPILLARY: 99 mg/dL (ref 65–99)
Glucose-Capillary: 112 mg/dL — ABNORMAL HIGH (ref 65–99)
Glucose-Capillary: 113 mg/dL — ABNORMAL HIGH (ref 65–99)

## 2015-01-10 LAB — TSH: TSH: 4.725 u[IU]/mL — ABNORMAL HIGH (ref 0.350–4.500)

## 2015-01-10 MED ORDER — CLONAZEPAM 1 MG PO TABS
1.0000 mg | ORAL_TABLET | Freq: Two times a day (BID) | ORAL | Status: DC
  Administered 2015-01-10 – 2015-01-18 (×16): 1 mg via ORAL
  Filled 2015-01-10 (×16): qty 1

## 2015-01-10 MED ORDER — IBUPROFEN 600 MG PO TABS
600.0000 mg | ORAL_TABLET | Freq: Four times a day (QID) | ORAL | Status: DC | PRN
  Administered 2015-01-10 – 2015-01-20 (×10): 600 mg via ORAL
  Filled 2015-01-10 (×10): qty 1

## 2015-01-10 MED ORDER — ZIPRASIDONE HCL 60 MG PO CAPS
60.0000 mg | ORAL_CAPSULE | Freq: Two times a day (BID) | ORAL | Status: DC
  Administered 2015-01-10 – 2015-01-14 (×8): 60 mg via ORAL
  Filled 2015-01-10 (×11): qty 1

## 2015-01-10 NOTE — Progress Notes (Signed)
Pt reports she is still very depressed, but it brightened her day to see her sons this evening.  One of her son's turned 16 today.  She was happy to see him.  Pt feels helpless with her situation.  She is still having thoughts to hurt herself, but can contract for safety.  She presents flat and depressed.  She has attended some groups today.  She denies HI/AVH.  Pt has also been tearful this evening.  Pt was encouraged to make her needs known to staff as she tends to isolate herself.  Support and encouragement offered.  Pt intends to return home at discharge.  Safety maintained with q15 minute checks.

## 2015-01-10 NOTE — Progress Notes (Signed)
Recreation Therapy Notes  Date: 06.13.16 Time: 9:30 am Location: [address] Group Room  Group Topic: Stress Management  Goal Area(s) Addresses:  Patient will verbalize importance of using healthy stress management.  Patient will identify positive emotions associated with healthy stress management.   Intervention: Stress Management  Activity : Guided Imagery. LRT introduced and educated patients on stress management technique of guided imagery. A script was used to deliver the techniques to patients. Patients were asked to follow script real aloud by LRT to engage in practicing guided imagery.  Education: Stress Management, Discharge Planning.   Clinical Observations/Feedback: Patient did not attend group.    Stacey [name], LRT/CTRS        Vonnie Spagnolo A 01/10/2015 1:29 PM 

## 2015-01-10 NOTE — Progress Notes (Addendum)
Inpatient Diabetes Program Recommendations  AACE/ADA: New Consensus Statement on Inpatient Glycemic Control (2013)  Target Ranges:  Prepandial:   less than 140 mg/dL      Peak postprandial:   less than 180 mg/dL (1-2 hours)      Critically ill patients:  140 - 180 mg/dL    Results for Stacey, [name] (MRN [id number]) as of 01/10/2015 13:01  Ref. Range 01/10/2015 05:48 01/10/2015 12:04  Glucose-Capillary Latest Ref Range: 65-99 mg/dL 458 (H) 592 (H)     Admit with: Depression  History: Morbid Obesity, OSA  Current Orders: Novolog Moderate SSI (0-15 units) tid with meals    **Note patient admitted last PM.  Orders placed for CBGs tid ac + HS and Novolog Moderate SSI.  **Patient has not required any Novolog SSI as of yet.  CBGs stable.  **Note A1c pending.  MD wanting to check to see if patient has undiagnosed DM.  Of note, patient weight is 326 pounds (147 kg).  **Also note RD has been consulted for possible DM diet education.  Patient would benefit from weight loss education as well.    MD- ADA recognizes an A1c of 6.5% or greater as a positive diagnosis of DM.  A1c of 5.7-6.4% is high risk for development of DM "pre-diabetes".  MD- If you diagnose patient with DM during this admission, RNs caring for patient will need to begin basic DM education with patient.  Will follow daily and assist as needed.    Ambrose Finland RN, MSN, CDE Diabetes Coordinator Inpatient Glycemic Control Team Team Pager: [phone] (8a-5p)

## 2015-01-10 NOTE — [facility] Group Notes (Signed)
[facility] LCSW Group Therapy 01/10/2015  1:15 pm  Type of Therapy: Group Therapy Participation Level: Minimal  Participation Quality: Attentive  Affect: Depressed and Flat  Cognitive: Alert and Oriented  Insight: Developing/Improving and Engaged  Engagement in Therapy: Developing/Improving and Engaged  Modes of Intervention: Clarification, Confrontation, Discussion, Education, Exploration,  Limit-setting, Orientation, Problem-solving, Rapport Building, Dance movement psychotherapist, Socialization and Support  Summary of Progress/Problems: Pt identified obstacles faced currently and processed barriers involved in overcoming these obstacles. Pt identified steps necessary for overcoming these obstacles and explored motivation (internal and external) for facing these difficulties head on. Pt further identified one area of concern in their lives and chose a goal to focus on for today. Patient was observed actively listening during discussion but did not participate despite CSW encouragement. Patient left group early to talk to MD and did not return.  [name], MSW, [employer] Clinical Social Worker [facility] [phone]

## 2015-01-10 NOTE — Consult Note (Addendum)
Patient Demographics  Stacey [name], is a 49 y.o. female   MRN: [id number]   DOB - 11-25-1965  Admit Date - 01/09/2015    Outpatient Primary MD for the patient is Kaleen Mask, MD  Consult requested in the Hospital by Craige Cotta, MD, On 01/10/2015    Reason for consult : Skin bruise   With History of -  Past Medical History  Diagnosis Date  . Asthma   . Allergic rhinitis, cause unspecified   . Bipolar 1 disorder   . Morbid obesity   . Thyroid nodule   . Mental disorder   . 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 Pt has never been tested.   . Anxiety   . OSA (obstructive sleep apnea)     uses cpap does not know settings  . Complication of anesthesia 2004    bp dropped after c section, took 24hrs to feels leg , numbing medicine does not last long when injected into skin  . Difficult intravenous access     hard stick for ivs and lab per pt  . Nasal bone fracture age 32    left side cannot place tube in nostril per pt  . Iron deficiency anemia, unspecified 02/24/2013  . Pernicious anemia 02/24/2013  . Anxiety   . Fibromyalgia       Past Surgical History  Procedure Laterality Date  . Tubal ligation    . Cesarean section    . Noraplant    . Wisdom tooth extraction    . Esophagogastroduodenoscopy N/A 01/02/2013    Procedure: ESOPHAGOGASTRODUODENOSCOPY (EGD);  Surgeon: Louis Meckel, MD;  Location: [facility];  Service: Endoscopy;  Laterality: N/A;  . Colonoscopy with propofol N/A 01/02/2013    Procedure: COLONOSCOPY WITH PROPOFOL;  Surgeon: Louis Meckel, MD;  Location: [facility];  Service: Endoscopy;  Laterality: N/A;    in for   No chief complaint on file.    HPI  Stacey [name]  is a 49 y.o. female, with a history of iron deficiency and B-12 deficiency anemia, coronary was admitted to be itching which on 6/12 for depression and feeling  overwhelmed, medicine were consulted reason patient takes large doses of aspirin, reports sticking 325 mg oral twice a day, as well as was small amount of petechial rash more distal on the right wrist, patient reports this rash has been there for a few months, change in, she denies any epistaxis, bleeding, fatigue shortness of breath or chest pain, patient reports she's been taking aspirin 325 mg twice a day for last few month, as reports she's been through treatment for fibromyalgia pain, on admission labs showing normal left disease, creatinine of 0.56, and bicarbonate of 28, and anion gap of 9, platelets within normal range , 328, patient's salicylate level was less than 4.    Review of Systems    In addition to the HPI above,  No Fever-chills, No Headache, No changes with Vision or hearing, No problems swallowing food or Liquids,  No Chest pain, Cough or Shortness of Breath, No Abdominal pain, No Nausea or Vommitting, Bowel movements are regular, No Blood in stool or Urine, No dysuria, No new skin rashes or bruises, No new joints pains-aches,  No new weakness, tingling, numbness in any extremity, No recent weight gain or loss, No polyuria, polydypsia or polyphagia,   A full 10 point Review of Systems was done, except as stated above, all other Review of Systems were negative.   Social History History  Substance Use Topics  . Smoking status: Former Smoker -- 0.50 packs/day for 20 years    Types: Cigarettes    Start date: 02/05/1987    Quit date: 07/30/2006  . Smokeless tobacco: Never Used     Comment: quit 7 years ago  . Alcohol Use: Yes     Comment: socially     Family History Family History  Problem Relation Age of Onset  . Breast cancer Mother   . Asthma Mother   . Emphysema Paternal Grandmother   . Diabetes Father      Prior to Admission medications   Medication Sig Start Date End Date Taking? Authorizing Provider  albuterol (PROVENTIL HFA;VENTOLIN HFA) 108 (90  BASE) MCG/ACT inhaler Inhale 2 puffs into the lungs 3 (three) times daily.     Historical Provider, MD  aspirin 325 MG tablet Take 650 mg by mouth 3 (three) times daily.    Historical Provider, MD  beclomethasone (QVAR) 80 MCG/ACT inhaler Inhale 2 puffs into the lungs 2 (two) times daily.     Historical Provider, MD  clonazePAM (KLONOPIN) 1 MG tablet Take 1 mg by mouth 2 (two) times daily.    Historical Provider, MD  diphenhydrAMINE (BENADRYL) 25 MG tablet Take 25 mg by mouth every 6 (six) hours as needed for itching or allergies.    Historical Provider, MD  EPINEPHrine (EPI-PEN) 0.3 mg/0.3 mL DEVI Inject 0.3 mg into the muscle as needed (for allergic reaction).     Historical Provider, MD  FLUoxetine (PROZAC) 40 MG capsule Take 80 mg by mouth every evening.     Historical Provider, MD  gabapentin (NEURONTIN) 300 MG capsule Take 900 mg by mouth 3 (three) times daily.    Historical Provider, MD  methocarbamol (ROBAXIN) 500 MG tablet Take 1,000 mg by mouth every 8 (eight) hours as needed for muscle spasms.     Historical Provider, MD  omeprazole (PRILOSEC) 40 MG capsule Take 40 mg by mouth daily.    Historical Provider, MD  ondansetron (ZOFRAN) 4 MG tablet Take 4 mg by mouth 2 (two) times daily as needed for nausea (migraines).    Historical Provider, MD  rizatriptan (MAXALT-MLT) 10 MG disintegrating tablet Take 10 mg by mouth daily as needed for migraine. May repeat in 2 hours if needed for migraines 12/09/12   [name], MD  traMADol-acetaminophen (ULTRACET) 37.5-325 MG per tablet Take 1 tablet by mouth every 8 (eight) hours as needed for moderate pain.     Historical Provider, MD  ziprasidone (GEODON) 60 MG capsule Take 120 mg by mouth at bedtime. Take 2 capsules daily    Historical Provider, MD    Anti-infectives    None      Scheduled Meds: . beclomethasone  2 puff Inhalation BID  . clonazePAM  1 mg Oral BID  . divalproex  500 mg Oral BID  . gabapentin  900 mg Oral TID  . insulin aspart   0-15 Units Subcutaneous TID WC  . pantoprazole  40 mg  Oral Daily  . ziprasidone  60 mg Oral BID WC   Continuous Infusions:  PRN Meds:.acetaminophen, albuterol, alum & mag hydroxide-simeth, diphenhydrAMINE, hydrOXYzine, magnesium hydroxide, methocarbamol, traZODone  Allergies  Allergen Reactions  . Bee Venom   . Bupropion Hcl Other (See Comments)    swelling in hands  . Capzasin [Capsaicin] Other (See Comments)    Red and burning  . Citalopram Hydrobromide Other (See Comments)    Doesn't remember reaction  . Other Other (See Comments)    Bleach cause vocal cord to close  . Stevia [Stevioside] Swelling    Face and throat swell   . Strawberry Other (See Comments)    Caused facial swelling and rash  . Lamictal [Lamotrigine] Rash  . Latex Other (See Comments)    Blisters where the latex touched it    Physical Exam  Vitals  Blood pressure 122/67, pulse 86, temperature 98.1 F (36.7 C), temperature source Oral, resp. rate 16, height 5' 8.5" (1.74 m), weight 147.873 kg (326 lb), last menstrual period 12/09/2014, SpO2 100 %.   1. General obese female lying in bed in NAD,    2. Normal affect and insight, Not Suicidal or Homicidal, Awake Alert, Oriented X 3.  3. No F.N deficits, ALL C.Nerves Intact, Strength 5/5 all 4 extremities, Sensation intact all 4 extremities, Plantars down going.  4. Ears and Eyes appear Normal, Conjunctivae clear, PERRLA. Moist Oral Mucosa.  5. Supple Neck, No JVD, No cervical lymphadenopathy appriciated, No Carotid Bruits.  6. Symmetrical Chest wall movement, Good air movement bilaterally, CTAB.  7. RRR, No Gallops, Rubs or Murmurs, No Parasternal Heave.  8. Positive Bowel Sounds, Abdomen Soft, No tenderness, No organomegaly appriciated,No rebound -guarding or rigidity.  9.  No Cyanosis, Normal Skin Turgor, small area of particular bruising on the right wrist.  10. Good muscle tone,  joints appear normal , no effusions, Normal ROM.  11. No  Palpable Lymph Nodes in Neck or Axillae    Data Review  CBC  Recent Labs Lab 01/08/15 2333  WBC 11.5*  HGB 12.8  HCT 41.6  PLT 358  MCV 88.5  MCH 27.2  MCHC 30.8  RDW 14.8  LYMPHSABS 1.0  MONOABS 0.5  EOSABS 0.2  BASOSABS 0.0   ------------------------------------------------------------------------------------------------------------------  Chemistries   Recent Labs Lab 01/08/15 2333  NA 138  K 3.7  CL 101  CO2 28  GLUCOSE 131*  BUN 12  CREATININE 0.56  CALCIUM 9.3  AST 29  ALT 39  ALKPHOS 142*  BILITOT 0.1*   ------------------------------------------------------------------------------------------------------------------ estimated creatinine clearance is 134.8 mL/min (by C-G formula based on Cr of 0.56). ------------------------------------------------------------------------------------------------------------------  Recent Labs  01/10/15 0620  TSH 4.725*     Coagulation profile No results for input(s): INR, PROTIME in the last 168 hours. ------------------------------------------------------------------------------------------------------------------- No results for input(s): DDIMER in the last 72 hours. -------------------------------------------------------------------------------------------------------------------  Cardiac Enzymes No results for input(s): CKMB, TROPONINI, MYOGLOBIN in the last 168 hours.  Invalid input(s): CK ------------------------------------------------------------------------------------------------------------------ Invalid input(s): POCBNP   ---------------------------------------------------------------------------------------------------------------  Urinalysis    Component Value Date/Time   COLORURINE YELLOW 02/27/2013 1719   APPEARANCEUR CLEAR 02/27/2013 1719   LABSPEC 1.013 02/27/2013 1719   PHURINE 6.0 02/27/2013 1719   GLUCOSEU NEGATIVE 02/27/2013 1719   HGBUR TRACE* 02/27/2013 1719   BILIRUBINUR  NEGATIVE 02/27/2013 1719   KETONESUR NEGATIVE 02/27/2013 1719   PROTEINUR NEGATIVE 02/27/2013 1719   UROBILINOGEN 0.2 02/27/2013 1719   NITRITE NEGATIVE 02/27/2013 1719   LEUKOCYTESUR SMALL* 02/27/2013 1719  Imaging results:   No results found.     Assessment & Plan  Principal Problem:   Bipolar I disorder, most recent episode (or current) unspecified Active Problems:   Panic attacks    Petechial bruise - Very minimal, stable over a few month, normal platelets count, normal LFTs with no indication of hemolysis, patient was taking high dose aspirin at home so is easy to bruise, aspirin has  been stopped,  would not do any further workup monitor clinically. - Patient instructed to avoid use of large dose aspirin, she is already on large dose gabapentin for her fibromyalgia, no evidence of metabolic acidosis, or salicylate toxicity.    DVT Prophylaxis SCDs   AM Labs Ordered, also please review Full Orders  Family Communication: Plan discussed with patient    Thank you for the consult, will follow on the peripheral smear results, we'll sign off , please reconsult [place] if any needs arise.  Randol Kern, Ahmari Garton M.D on 01/10/2015 at 3:49 PM  Between 7am to 7pm - Pager - [phone]  After 7pm go to [email or web address] - password [email or web address]   Thank you for the consult, we will follow the patient with you in the Hospital.   [facility]   Office  [phone]

## 2015-01-10 NOTE — Plan of Care (Signed)
Problem: Diagnosis: Increased Risk For Suicide Attempt Goal: STG-Patient Will Report Suicidal Feelings to Staff Outcome: Progressing Patient reports suicidal feelings but is able to contract for safety. Patient denies a plan at this time.

## 2015-01-10 NOTE — Progress Notes (Signed)
Patient ID: Stacey [name], female   DOB: 1965/10/08, 49 y.o.   MRN: [id number]  DAR: Pt. Denies HI and A/V Hallucinations. Patient reports SI with no current plan. Patient reports it is just thoughts that occur sometimes. Patient is able to contract for safety. Patient reports chronic pain in her back due to fibromyalgia. Patient receives scheduled medications for this. Patient received PRN Tylenol for a headache which helped. Support and encouragement provided to the patient. Scheduled medications administered to patient per physician's orders that were within order parameters. Patient is depressed and often tearful. Patient was seen in the dayroom crying after a phone call with her son. Patient reports it is his birthday today and she is sad she cannot be with him. Patient is receptive and cooperative however is cautious in interaction. Patient is seen in the milieu and is attending some groups however she appears withdrawn from her peers. Patient rates her depression 7/10, hopelessness 8/10, and anxiety 7/10. Patient received PRN Klonopin which was helpful. Q15 minute checks are maintained for safety.

## 2015-01-10 NOTE — Plan of Care (Signed)
Problem: Food- and Nutrition-Related Knowledge Deficit (NB-1.1) Goal: Nutrition education Formal process to instruct or train a patient/client in a skill or to impart knowledge to help patients/clients voluntarily manage or modify food choices and eating behavior to maintain or improve health. Outcome: Completed/Met Date Met:  01/10/15  RD consulted for nutrition education regarding diabetes or possible diagnosis.   No results found for: HGBA1C -pending  RD provided brief nutrition education for patient. Discussed different food groups and their effects on blood sugar. Pt with questions about breakfast foods she should eat and RD provided examples of healthy breakfast choices.  Provided examples of ways to balance meals/snacks and encouraged intake of high-fiber, whole grain complex carbohydrates. Teach back method used.  Body mass index is 48.84 kg/(m^2). Pt meets criteria for morbid obesity based on current BMI.  Labs and medications reviewed. RD to follow-up if patient is diagnosed with diabetes. If additional nutrition issues arise, please re-consult RD.  Clayton Bibles, MS, RD, LDN Pager: [phone] After Hours Pager: [phone]

## 2015-01-10 NOTE — Progress Notes (Signed)
[facility] Group Notes:  (Nursing/MHT/Case Management/Adjunct)  Date:  01/10/2015  Time:  9:43 PM  Type of Therapy:  Psychoeducational Skills  Participation Level:  Active  Participation Quality:  Appropriate  Affect:  Appropriate  Cognitive:  Appropriate  Insight:  Good  Engagement in Group:  Engaged  Modes of Intervention:  Discussion  Summary of Progress/Problems: Tonight in wrap up group Aleece said that her day was pretty up and down. But that it got better once her sons came to see her. Overall she has been adjusting throughout the day. Madaline Savage 01/10/2015, 9:43 PM

## 2015-01-10 NOTE — [facility] Group Notes (Signed)
   [facility] LCSW Aftercare Discharge Planning Group Note  01/10/2015  8:45 AM   Participation Quality: Alert, Appropriate and Oriented  Mood/Affect: Depressed and Flat  Depression Rating: 6-7  Anxiety Rating: 6  Thoughts of Suicide: Pt denies SI/HI  Will you contract for safety? Yes  Current AVH: Pt denies  Plan for Discharge/Comments: Pt attended discharge planning group and actively participated in group. CSW provided pt with today's workbook. Patient reports feeling unwell today. She plans to return home to follow up with [facility] at discharge.  Transportation Means: Pt reports access to transportation  Supports: No supports mentioned at this time  [name], MSW, [employer] Clinical Social Worker [employer] [phone]

## 2015-01-10 NOTE — Progress Notes (Addendum)
[facility] MD Progress Note  01/10/2015 2:04 PM Stacey [name]  MRN:  [id number] Subjective:  She states she feels anxious, depressed, and is reporting significant insomnia . Objective : I have discussed case with treatment team and have met with patient. 49 year old married female, who has been diagnosed with Bipolar Disorder. She has recently been facing significant marital stress, and states her husband " just left" recently.  States she has felt increasingly depressed due to marital issues, and decided to come to ED, and had developed suicidal ideations. She presents depressed and anxious, intermittently tearful when she discusses her stressors . She is ruminative - states " I don't know how I am going to deal with all this when I go home ".  States " my adult is taking care of the younger kids, but I worry about our chickens, pets , who is going to mow the grass of our property". States " I have tried to call my husband but he is not answering ".  She is visible in day room and has been going to groups. Prior to admission she had been on Geodon, Klonopin. Since admission is being managed with Depakote .  Thus far well tolerated . She is hoping to continue Geodon , as she states it helps and is well tolerated . She fears she may develop psychotic symptoms off it- at this time denies any hallucinations/no delusions expressed .  Principal Problem: Bipolar I disorder, most recent episode (or current) unspecified Diagnosis:   Patient Active Problem List   Diagnosis Date Noted  . Bipolar I disorder, most recent episode (or current) unspecified [F31.9] 01/09/2015  . Panic attacks [F41.0] 01/09/2015  . Headache(784.0) [R51] 07/10/2013  . Depressive disorder [F32.9] 03/08/2013  . Aphthous ulcer [K12.0] 03/01/2013  . Cellulitis [L03.90] 03/01/2013  . Chest pain [R07.9] 02/27/2013  . Iron deficiency anemia, unspecified [D50.9] 02/24/2013  . Pernicious anemia [D51.0] 02/24/2013  . Abnormality of  gait [R26.9] 12/09/2012  . Dysphagia, unspecified(787.20) [R13.10] 12/08/2012  . DUB (dysfunctional uterine bleeding) [N93.8] 05/30/2011  . Anemia [D64.9] 05/30/2011  . COUGH [R05] 03/29/2010  . SKIN RASH [R21] 09/30/2009  . ACUTE BRONCHITIS [J20.9] 08/10/2009  . ALCOHOLISM [F10.20] 08/05/2009  . DIARRHEA [R19.7] 08/05/2009  . SINUSITIS, ACUTE [J01.90] 07/06/2009  . CELIAC SPRUE [K90.0] 07/06/2009  . 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 THYROID NODULE [E04.1] 03/04/2009  . Allergic rhinitis, cause unspecified [J30.9] 01/27/2009  . OBSTRUCTIVE SLEEP APNEA [G47.30] 01/26/2009  . SHORTNESS OF BREATH (SOB) [R06.02] 12/22/2008  . FIBROMYALGIA [M79.1, M60.9] 12/13/2008   Total Time spent with patient: 30 minutes   Past Medical History:  Past Medical History  Diagnosis Date  . Asthma   . Allergic rhinitis, cause unspecified   . Bipolar 1 disorder   . Morbid obesity   . Thyroid nodule   . Mental disorder   . 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 Pt has never been tested.   . Anxiety   . OSA (obstructive sleep apnea)     uses cpap does not know settings  . Complication of anesthesia 2004    bp dropped after c section, took 24hrs to feels leg , numbing medicine does not last long when injected into skin  . Difficult intravenous access     hard stick for ivs and lab per pt  . Nasal bone fracture age 64    left side cannot place tube in nostril per pt  . Iron deficiency anemia,  unspecified 02/24/2013  . Pernicious anemia 02/24/2013  . Anxiety   . Fibromyalgia     Past Surgical History  Procedure Laterality Date  . Tubal ligation    . Cesarean section    . Noraplant    . Wisdom tooth extraction    . Esophagogastroduodenoscopy N/A 01/02/2013    Procedure: ESOPHAGOGASTRODUODENOSCOPY (EGD);  Surgeon: Inda Castle, MD;  Location: [facility];  Service: Endoscopy;  Laterality: N/A;  . Colonoscopy with propofol N/A 01/02/2013    Procedure: COLONOSCOPY WITH PROPOFOL;   Surgeon: Inda Castle, MD;  Location: [facility];  Service: Endoscopy;  Laterality: N/A;   Family History:  Family History  Problem Relation Age of Onset  . Breast cancer Mother   . Asthma Mother   . Emphysema Paternal Grandmother   . Diabetes Father    Social History:  History  Alcohol Use  . Yes    Comment: socially     History  Drug Use No    History   Social History  . Marital Status: Married    Spouse Name: [place]ena  . Number of Children: 4  . Years of Education: Bachelors   Occupational History  . Unemployed    Social History Main Topics  . Smoking status: Former Smoker -- 0.50 packs/day for 20 years    Types: Cigarettes    Start date: 02/05/1987    Quit date: 07/30/2006  . Smokeless tobacco: Never Used     Comment: quit 7 years ago  . Alcohol Use: Yes     Comment: socially  . Drug Use: No  . Sexual Activity: Yes    Birth Control/ Protection: Surgical   Other Topics Concern  . None   Social History Narrative   [facility] 4 children 21, 15, 9, 10, she stays at home.   Patient has a Bachelor's degree.   Patient is right- handed.   Patient drinks one cup of coffee in the morning, sometimes 2 cups.         Additional History:    Sleep: fair   Appetite:  Fair     Musculoskeletal: Strength & Muscle Tone: within normal limits Gait & Station: normal Patient leans: N/A   Psychiatric Specialty Exam: Physical Exam  ROS denies chest pain, no shortness of breath,describes mild nausea, and loose stools, which she states she has had for several days, and which she attributes to IBS, exacerbated by her current stress level.  Blood pressure 122/67, pulse 86, temperature 98.1 F (36.7 C), temperature source Oral, resp. rate 16, height 5' 8.5" (1.74 m), weight 326 lb (147.873 kg), last menstrual period 12/09/2014, SpO2 100 %.Body mass index is 48.84 kg/(m^2).  General Appearance: Fairly Groomed  Engineer, water::  Good  Speech:   Normal Rate  Volume:  Normal  Mood:  Anxious and Depressed  Affect:  Constricted and anxious   Thought Process:  Goal Directed and Linear  Orientation:  Full (Time, Place, and Person)  Thought Content:  linear, no flight of ideations  Suicidal Thoughts:  Yes.  without intent/plan at this time denies any plan or intention of hurting self and is able to contract for safety on the unit    Homicidal Thoughts:  No specifically also denies any thoughts of hurting her husband   Memory:  recent and remote grossly intact   Judgement:  Fair  Insight:  Fair  Psychomotor Activity:  some agitation  Concentration:  Good  Recall:  [place]  of Knowledge:Good  Language: Good  Akathisia:  Negative  Handed:  Right  AIMS (if indicated):     Assets:  Communication Skills Desire for Improvement Resilience  ADL's:  Fair   Cognition: WNL  Sleep:  Number of Hours: 6.5     Current Medications: Current Facility-Administered Medications  Medication Dose Route Frequency Provider Last Rate Last Dose  . acetaminophen (TYLENOL) tablet 650 mg  650 mg Oral Q6H PRN Kerrie Buffalo, NP      . albuterol (PROVENTIL HFA;VENTOLIN HFA) 108 (90 BASE) MCG/ACT inhaler 2 puff  2 puff Inhalation TID PRN Kerrie Buffalo, NP      . alum & mag hydroxide-simeth (MAALOX/MYLANTA) 200-200-20 MG/5ML suspension 30 mL  30 mL Oral Q4H PRN Kerrie Buffalo, NP      . aspirin tablet 650 mg  650 mg Oral TID Kerrie Buffalo, NP   650 mg at 01/10/15 1214  . beclomethasone (QVAR) 80 MCG/ACT inhaler 2 puff  2 puff Inhalation BID Kerrie Buffalo, NP   2 puff at 01/10/15 0813  . clonazePAM (KLONOPIN) tablet 1 mg  1 mg Oral BID PRN Ursula Alert, MD   1 mg at 01/10/15 0812  . diphenhydrAMINE (BENADRYL) capsule 25 mg  25 mg Oral Q6H PRN Kerrie Buffalo, NP      . divalproex (DEPAKOTE) DR tablet 500 mg  500 mg Oral BID Kerrie Buffalo, NP   500 mg at 01/10/15 0813  . gabapentin (NEURONTIN) capsule 900 mg  900 mg Oral TID Kerrie Buffalo, NP   900 mg  at 01/10/15 1214  . hydrOXYzine (ATARAX/VISTARIL) tablet 25 mg  25 mg Oral TID PRN Kerrie Buffalo, NP      . insulin aspart (novoLOG) injection 0-15 Units  0-15 Units Subcutaneous TID WC Kerrie Buffalo, NP   0 Units at 01/09/15 1705  . magnesium hydroxide (MILK OF MAGNESIA) suspension 30 mL  30 mL Oral Daily PRN Kerrie Buffalo, NP      . methocarbamol (ROBAXIN) tablet 1,000 mg  1,000 mg Oral Q8H PRN Kerrie Buffalo, NP      . pantoprazole (PROTONIX) EC tablet 40 mg  40 mg Oral Daily Jenne Campus, MD   40 mg at 01/10/15 0814  . traZODone (DESYREL) tablet 50 mg  50 mg Oral QHS PRN Ursula Alert, MD      . ziprasidone (GEODON) capsule 120 mg  120 mg Oral Q supper Ursula Alert, MD   120 mg at 01/09/15 1720    Lab Results:  Results for orders placed or performed during the hospital encounter of 01/09/15 (from the past 48 hour(s))  Glucose, capillary     Status: Abnormal   Collection Time: 01/09/15  4:50 PM  Result Value Ref Range   Glucose-Capillary 115 (H) 65 - 99 mg/dL   Comment 1 Notify RN    Comment 2 Document in Chart   Pregnancy, urine     Status: None   Collection Time: 01/09/15  5:10 PM  Result Value Ref Range   Preg Test, Ur NEGATIVE NEGATIVE    Comment:        THE SENSITIVITY OF THIS METHODOLOGY IS >20 mIU/mL. Performed at [facility]   Glucose, capillary     Status: Abnormal   Collection Time: 01/09/15  8:58 PM  Result Value Ref Range   Glucose-Capillary 121 (H) 65 - 99 mg/dL  Glucose, capillary     Status: Abnormal   Collection Time: 01/10/15  5:48 AM  Result Value Ref Range  Glucose-Capillary 114 (H) 65 - 99 mg/dL  TSH     Status: Abnormal   Collection Time: 01/10/15  6:20 AM  Result Value Ref Range   TSH 4.725 (H) 0.350 - 4.500 uIU/mL    Comment: Performed at [facility]  Lipid panel     Status: Abnormal   Collection Time: 01/10/15  6:20 AM  Result Value Ref Range   Cholesterol 170 0 - 200 mg/dL   Triglycerides 110 <150  mg/dL   HDL 45 >40 mg/dL   Total CHOL/HDL Ratio 3.8 RATIO   VLDL 22 0 - 40 mg/dL   LDL Cholesterol 103 (H) 0 - 99 mg/dL    Comment:        Total Cholesterol/HDL:CHD Risk Coronary Heart Disease Risk Table                     Men   Women  1/2 Average Risk   3.4   3.3  Average Risk       5.0   4.4  2 X Average Risk   9.6   7.1  3 X Average Risk  23.4   11.0        Use the calculated Patient Ratio above and the CHD Risk Table to determine the patient's CHD Risk.        ATP III CLASSIFICATION (LDL):  <100     mg/dL   Optimal  100-129  mg/dL   Near or Above                    Optimal  130-159  mg/dL   Borderline  160-189  mg/dL   High  >190     mg/dL   Very High Performed at [facility]   Glucose, capillary     Status: Abnormal   Collection Time: 01/10/15 12:04 PM  Result Value Ref Range   Glucose-Capillary 112 (H) 65 - 99 mg/dL    Physical Findings: AIMS:  , ,  ,  ,    CIWA:  CIWA-Ar Total: 4 COWS:     Assessment -  49 year old female, history of Bipolar Disorder, currently depressed, anxious, in the context of marital stress, husband leaving recently. At  this time patient is depressed, anxious, sad , ruminative about her stressors. She is not psychotic. She is tolerating Depakote ER  (new medication for her ) well so far. She is wanting to continue Geodon , which she had been taking for years , and which she states is helpful and well tolerated. Of note, she has also been on Neurontin for several years, for management of chronic pain/fibromyalgia. Insomnia is described as an ongoing symptom, which she states is worsening. Of note, has been taking large doses of ASA at home related to chronic pain and has petechiae - like lesions on R forearm.   Treatment Plan Summary: Daily contact with patient to assess and evaluate symptoms and progress in treatment, Medication management, Plan ongoing inpatient treatment  and medication management as below Depakote ER 500 mgrs  BID  for management of Mood Disorder  Neurontin 900 mgrs TID  For management of Fibromyalgia, chronic pain Continue Geodon 60 mgrs BID for management of Mood Disorder/Bipolar Disorder  Change Klonopin to 1 mgr BID to address anxiety and Insomnia . Of note, patient is on high dose of ASA, which she states is for chronic pain- she describes petechiae intermittently. Will D/C and request hospitalist consult . Will  order T3 and T4 to follow up on slightly elevated TSH  Continue CPAP use for management of documented  Sleep Apnea.  Medical Decision Making:  Review of Psycho-Social Stressors (1), Review or order clinical lab tests (1), Established Problem, Worsening (2), Review of Medication Regimen & Side Effects (2) and Review of New Medication or Change in Dosage (2)     COBOS, FERNANDO 01/10/2015, 2:04 PM

## 2015-01-11 ENCOUNTER — Other Ambulatory Visit: Payer: Self-pay

## 2015-01-11 LAB — GLUCOSE, CAPILLARY
GLUCOSE-CAPILLARY: 89 mg/dL (ref 65–99)
GLUCOSE-CAPILLARY: 76 mg/dL (ref 65–99)
Glucose-Capillary: 94 mg/dL (ref 65–99)
Glucose-Capillary: 98 mg/dL (ref 65–99)

## 2015-01-11 LAB — HEMOGLOBIN A1C
Hgb A1c MFr Bld: 5.5 % (ref 4.8–5.6)
MEAN PLASMA GLUCOSE: 111 mg/dL

## 2015-01-11 LAB — T4, FREE: Free T4: 0.98 ng/dL (ref 0.61–1.12)

## 2015-01-11 NOTE — Progress Notes (Addendum)
[facility] MD Progress Note  01/11/2015 6:04 PM Stacey [name]  MRN:  [id number] Subjective:  Patient states she still feels very depressed. She states that her husband was in touch with her adult son but that when she tries to call his phone he does not answer. She continues to ruminate about his having left her recently. She denies medication side effects.  Objective:  I've discussed case with treatment team and have met with patient.  As per staff, patient still depressed, anxious. Some group participation. Today patient remains focused on marital stressors. Remains depressed, sad, but affect is less severely tearful compared to admission.  She is fairly responsive to support, encouragement, and empathy.  Denies medication side effects. Ruminative about marital separation, tends to blame self. Did express interest in a family meeting with her husband which she feels could be arranged via her adult son. Also states that she was able to have her son go by the house and ensure the well being of their pets which has removed some of the stress that she was feeling. Continues to endorse passive thoughts of death but denies plan and intention of hurting self on unit and contracts for safety on the unit.  TSH minimally elevated but T4 wnl.   Principal Problem: Bipolar I disorder, most recent episode (or current) unspecified Diagnosis:   Patient Active Problem List   Diagnosis Date Noted  . Bipolar I disorder, most recent episode (or current) unspecified [F31.9] 01/09/2015  . Panic attacks [F41.0] 01/09/2015  . Headache(784.0) [R51] 07/10/2013  . Depressive disorder [F32.9] 03/08/2013  . Aphthous ulcer [K12.0] 03/01/2013  . Cellulitis [L03.90] 03/01/2013  . Chest pain [R07.9] 02/27/2013  . Iron deficiency anemia, unspecified [D50.9] 02/24/2013  . Pernicious anemia [D51.0] 02/24/2013  . Abnormality of gait [R26.9] 12/09/2012  . Dysphagia, unspecified(787.20) [R13.10] 12/08/2012  . DUB  (dysfunctional uterine bleeding) [N93.8] 05/30/2011  . Anemia [D64.9] 05/30/2011  . COUGH [R05] 03/29/2010  . SKIN RASH [R21] 09/30/2009  . ACUTE BRONCHITIS [J20.9] 08/10/2009  . ALCOHOLISM [F10.20] 08/05/2009  . DIARRHEA [R19.7] 08/05/2009  . SINUSITIS, ACUTE [J01.90] 07/06/2009  . CELIAC SPRUE [K90.0] 07/06/2009  . 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 THYROID NODULE [E04.1] 03/04/2009  . Allergic rhinitis, cause unspecified [J30.9] 01/27/2009  . OBSTRUCTIVE SLEEP APNEA [G47.30] 01/26/2009  . SHORTNESS OF BREATH (SOB) [R06.02] 12/22/2008  . FIBROMYALGIA [M79.1, M60.9] 12/13/2008   Total Time spent with patient: 25 minutes   Past Medical History:  Past Medical History  Diagnosis Date  . Asthma   . Allergic rhinitis, cause unspecified   . Bipolar 1 disorder   . Morbid obesity   . Thyroid nodule   . Mental disorder   . 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 Pt has never been tested.   . Anxiety   . OSA (obstructive sleep apnea)     uses cpap does not know settings  . Complication of anesthesia 2004    bp dropped after c section, took 24hrs to feels leg , numbing medicine does not last long when injected into skin  . Difficult intravenous access     hard stick for ivs and lab per pt  . Nasal bone fracture age 11    left side cannot place tube in nostril per pt  . Iron deficiency anemia, unspecified 02/24/2013  . Pernicious anemia 02/24/2013  . Anxiety   . Fibromyalgia     Past Surgical History  Procedure Laterality Date  . Tubal ligation    .  Cesarean section    . Noraplant    . Wisdom tooth extraction    . Esophagogastroduodenoscopy N/A 01/02/2013    Procedure: ESOPHAGOGASTRODUODENOSCOPY (EGD);  Surgeon: Inda Castle, MD;  Location: [facility];  Service: Endoscopy;  Laterality: N/A;  . Colonoscopy with propofol N/A 01/02/2013    Procedure: COLONOSCOPY WITH PROPOFOL;  Surgeon: Inda Castle, MD;  Location: [facility];  Service: Endoscopy;  Laterality:  N/A;   Family History:  Family History  Problem Relation Age of Onset  . Breast cancer Mother   . Asthma Mother   . Emphysema Paternal Grandmother   . Diabetes Father    Social History:  History  Alcohol Use  . Yes    Comment: socially     History  Drug Use No    History   Social History  . Marital Status: Married    Spouse Name: Ridgeway  . Number of Children: 4  . Years of Education: Bachelors   Occupational History  . Unemployed    Social History Main Topics  . Smoking status: Former Smoker -- 0.50 packs/day for 20 years    Types: Cigarettes    Start date: 02/05/1987    Quit date: 07/30/2006  . Smokeless tobacco: Never Used     Comment: quit 7 years ago  . Alcohol Use: Yes     Comment: socially  . Drug Use: No  . Sexual Activity: Yes    Birth Control/ Protection: Surgical   Other Topics Concern  . None   Social History Narrative   [facility] 4 children 21, 15, 84, 10, she stays at home.   Patient has a Bachelor's degree.   Patient is right- handed.   Patient drinks one cup of coffee in the morning, sometimes 2 cups.         Additional History:    Sleep: Fair  Appetite:  Fair   Assessment:   Musculoskeletal: Strength & Muscle Tone: within normal limits Gait & Station: normal Patient leans: N/A   Psychiatric Specialty Exam: Physical Exam  ROS denies nausea or vomiting. Has foot tapping during session, states that this is related to anxiety, does not appear related to akathisia.   Blood pressure 111/64, pulse 87, temperature 98.1 F (36.7 C), temperature source Oral, resp. rate 16, height 5' 8.5" (1.74 m), weight 326 lb (147.873 kg), last menstrual period 12/09/2014, SpO2 100 %.Body mass index is 48.84 kg/(m^2).  General Appearance: Fairly Groomed  Engineer, water::  Fair  Speech:  Slow  Volume:  Decreased  Mood:  Depressed  Affect:  Constricted  Thought Process:  Linear  Orientation:  Full (Time, Place, and Person)   Thought Content:  Rumination as noted, ruminative about marital stress   Suicidal Thoughts:  Yes.  without intent/plan denies any plan or intention of hurting self on unit  Homicidal Thoughts:  No denies thoughts of hurting husband or anyone else  Memory:  recent and remote grossly intact  Judgement:  Fair  Insight:  Fair  Psychomotor Activity:  Decreased and but foot tapping noted  Concentration:  Fair  Recall:  Good  Fund of Knowledge:Good  Language: Good  Akathisia:  Negative  Handed:  Right  AIMS (if indicated):     Assets:  Resilience  ADL's:  fair  Cognition: WNL  Sleep:  Number of Hours: 6.5     Current Medications: Current Facility-Administered Medications  Medication Dose Route Frequency Provider Last Rate Last Dose  . acetaminophen (TYLENOL)  tablet 650 mg  650 mg Oral Q6H PRN Kerrie Buffalo, NP   650 mg at 01/10/15 1540  . albuterol (PROVENTIL HFA;VENTOLIN HFA) 108 (90 BASE) MCG/ACT inhaler 2 puff  2 puff Inhalation TID PRN Kerrie Buffalo, NP      . alum & mag hydroxide-simeth (MAALOX/MYLANTA) 200-200-20 MG/5ML suspension 30 mL  30 mL Oral Q4H PRN Kerrie Buffalo, NP      . beclomethasone (QVAR) 80 MCG/ACT inhaler 2 puff  2 puff Inhalation BID Kerrie Buffalo, NP   2 puff at 01/11/15 0800  . clonazePAM (KLONOPIN) tablet 1 mg  1 mg Oral BID Jenne Campus, MD   1 mg at 01/11/15 1656  . diphenhydrAMINE (BENADRYL) capsule 25 mg  25 mg Oral Q6H PRN Kerrie Buffalo, NP      . divalproex (DEPAKOTE) DR tablet 500 mg  500 mg Oral BID Kerrie Buffalo, NP   500 mg at 01/11/15 0801  . gabapentin (NEURONTIN) capsule 900 mg  900 mg Oral TID Kerrie Buffalo, NP   900 mg at 01/11/15 1659  . hydrOXYzine (ATARAX/VISTARIL) tablet 25 mg  25 mg Oral TID PRN Kerrie Buffalo, NP   25 mg at 01/10/15 2113  . ibuprofen (ADVIL,MOTRIN) tablet 600 mg  600 mg Oral Q6H PRN Niel Hummer, NP   600 mg at 01/11/15 0211  . insulin aspart (novoLOG) injection 0-15 Units  0-15 Units Subcutaneous TID WC Kerrie Buffalo, NP   0 Units at 01/09/15 1705  . magnesium hydroxide (MILK OF MAGNESIA) suspension 30 mL  30 mL Oral Daily PRN Kerrie Buffalo, NP      . methocarbamol (ROBAXIN) tablet 1,000 mg  1,000 mg Oral Q8H PRN Kerrie Buffalo, NP   1,000 mg at 01/11/15 0609  . pantoprazole (PROTONIX) EC tablet 40 mg  40 mg Oral Daily Jenne Campus, MD   40 mg at 01/11/15 0801  . traZODone (DESYREL) tablet 50 mg  50 mg Oral QHS PRN Ursula Alert, MD   50 mg at 01/10/15 2113  . ziprasidone (GEODON) capsule 60 mg  60 mg Oral BID WC Jenne Campus, MD   60 mg at 01/11/15 1656    Lab Results:  Results for orders placed or performed during the hospital encounter of 01/09/15 (from the past 48 hour(s))  Glucose, capillary     Status: Abnormal   Collection Time: 01/09/15  8:58 PM  Result Value Ref Range   Glucose-Capillary 121 (H) 65 - 99 mg/dL  Glucose, capillary     Status: Abnormal   Collection Time: 01/10/15  5:48 AM  Result Value Ref Range   Glucose-Capillary 114 (H) 65 - 99 mg/dL  TSH     Status: Abnormal   Collection Time: 01/10/15  6:20 AM  Result Value Ref Range   TSH 4.725 (H) 0.350 - 4.500 uIU/mL    Comment: Performed at [facility]  Lipid panel     Status: Abnormal   Collection Time: 01/10/15  6:20 AM  Result Value Ref Range   Cholesterol 170 0 - 200 mg/dL   Triglycerides 110 <150 mg/dL   HDL 45 >40 mg/dL   Total CHOL/HDL Ratio 3.8 RATIO   VLDL 22 0 - 40 mg/dL   LDL Cholesterol 103 (H) 0 - 99 mg/dL    Comment:        Total Cholesterol/HDL:CHD Risk Coronary Heart Disease Risk Table  Men   Women  1/2 Average Risk   3.4   3.3  Average Risk       5.0   4.4  2 X Average Risk   9.6   7.1  3 X Average Risk  23.4   11.0        Use the calculated Patient Ratio above and the CHD Risk Table to determine the patient's CHD Risk.        ATP III CLASSIFICATION (LDL):  <100     mg/dL   Optimal  100-129  mg/dL   Near or Above                    Optimal   130-159  mg/dL   Borderline  160-189  mg/dL   High  >190     mg/dL   Very High Performed at [facility]   Hemoglobin A1c     Status: None   Collection Time: 01/10/15  6:20 AM  Result Value Ref Range   Hgb A1c MFr Bld 5.5 4.8 - 5.6 %    Comment: (NOTE)         Pre-diabetes: 5.7 - 6.4         Diabetes: >6.4         Glycemic control for adults with diabetes: <7.0    Mean Plasma Glucose 111 mg/dL    Comment: (NOTE) Performed At: [facility] [place], [place] [id number] Lindon Romp MD SV:[id number] Performed at [facility]   Glucose, capillary     Status: Abnormal   Collection Time: 01/10/15 12:04 PM  Result Value Ref Range   Glucose-Capillary 112 (H) 65 - 99 mg/dL  Glucose, capillary     Status: None   Collection Time: 01/10/15  4:43 PM  Result Value Ref Range   Glucose-Capillary 99 65 - 99 mg/dL  Glucose, capillary     Status: Abnormal   Collection Time: 01/10/15  9:36 PM  Result Value Ref Range   Glucose-Capillary 113 (H) 65 - 99 mg/dL  Glucose, capillary     Status: None   Collection Time: 01/11/15  5:50 AM  Result Value Ref Range   Glucose-Capillary 94 65 - 99 mg/dL  T4, free     Status: None   Collection Time: 01/11/15  6:36 AM  Result Value Ref Range   Free T4 0.98 0.61 - 1.12 ng/dL    Comment: Performed at [facility]  Glucose, capillary     Status: None   Collection Time: 01/11/15 12:10 PM  Result Value Ref Range   Glucose-Capillary 89 65 - 99 mg/dL  Glucose, capillary     Status: None   Collection Time: 01/11/15  5:03 PM  Result Value Ref Range   Glucose-Capillary 76 65 - 99 mg/dL    Physical Findings: AIMS:  , ,  ,  ,    CIWA:  CIWA-Ar Total: 4 COWS:     Assessment: patient remains depressed, sad, and ruminative about marital conflict. Ongoing anxiety about same. Passive thoughts of death but able to contract for safety on unit. Thus far tolerating depakote trial well. Also on Geodon which  she has been on before with good response. Foot tapping/bouncing noted. Patient states she does this when anxious. At this time, not felt to be related to akathisia.  Treatment Plan Summary: Daily contact with patient to assess and evaluate symptoms and progress in treatment, Medication management, Plan Ongoing patient admission and  continue medications as below  Continue Depakote ER 500 mgs BID for management of mood disorder. Continue Geodon 60 mgs BID for management of mood disorder. Continue Klonopin 1 mg BID for management of anxiety. Continue Neurontin 900 mgs TID for management of chronic pain. On PPI for GERD management.  Medical Decision Making:  Established Problem, Stable/Improving (1), Review of Psycho-Social Stressors (1), Review or order clinical lab tests (1) and Review of Medication Regimen & Side Effects (2)     Ayvion Kavanagh 01/11/2015, 6:04 PM

## 2015-01-11 NOTE — Progress Notes (Signed)
[facility] Group Notes:  (Nursing/MHT/Case Management/Adjunct)  Date:  01/11/2015  Time:  9:21 PM   Type of Therapy:  Psychoeducational Skills  Participation Level:  Active  Participation Quality:  Appropriate  Affect:  Appropriate  Cognitive:  Appropriate  Insight:  Appropriate  Engagement in Group:  Engaged  Modes of Intervention:  Discussion  Summary of Progress/Problems: Tonight in wrap up group Stacey [name] said that today was up and down for her but the highlight(something positive) of her day was she got a book from another patient and was able to read throughout the day! Stacey [name] 01/11/2015, 9:21 PM

## 2015-01-11 NOTE — [facility] Group Notes (Signed)
[facility] LCSW Group Therapy  01/11/2015   1:15 PM   Type of Therapy:  Group Therapy  Participation Level:  Active  Participation Quality:  Attentive, Sharing and Supportive  Affect:  Depressed and Flat  Cognitive:  Alert and Oriented  Insight:  Developing/Improving and Engaged  Engagement in Therapy:  Developing/Improving and Engaged  Modes of Intervention:  Clarification, Confrontation, Discussion, Education, Exploration, Limit-setting, Orientation, Problem-solving, Rapport Building, Dance movement psychotherapist, Socialization and Support  Summary of Progress/Problems: The topic for group therapy was feelings about diagnosis.  Pt actively participated in group discussion on their past and current diagnosis and how they feel towards this.  Pt also identified how society and family members judge them, based on their diagnosis as well as stereotypes and stigmas.  Group discussion led by PhD intern. Patient discussed feeling "angry" when first diagnosed as bipolar because it effected her education and goals. She reports that it did prompt her to get help but that she has struggled with medication issues throughout the years. Group members provided patient with support and encouragement.   [name], MSW, [employer] Clinical Social Worker [facility] [phone]

## 2015-01-11 NOTE — [facility] Suicide Risk Assessment (Signed)
[facility] INPATIENT:  Family/Significant Other Suicide Prevention Education  Suicide Prevention Education:  Education Completed; Son Marilynn Rail [phone],  (name of family member/significant other) has been identified by the patient as the family member/significant other with whom the patient will be residing, and identified as the person(s) who will aid the patient in the event of a mental health crisis (suicidal ideations/suicide attempt).  With written consent from the patient, the family member/significant other has been provided the following suicide prevention education, prior to the and/or following the discharge of the patient.  The suicide prevention education provided includes the following:  Suicide risk factors  Suicide prevention and interventions  National Suicide Hotline telephone number  [facility] assessment telephone number  [facility] Emergency Assistance 911  [facility] and/or Residential Mobile Crisis Unit telephone number  Request made of family/significant other to:  Remove weapons (e.g., guns, rifles, knives), all items previously/currently identified as safety concern.    Remove drugs/medications (over-the-counter, prescriptions, illicit drugs), all items previously/currently identified as a safety concern.  The family member/significant other verbalizes understanding of the suicide prevention education information provided.  The family member/significant other agrees to remove the items of safety concern listed above.  Chaney Ingram, [place] 01/11/2015, 4:20 PM

## 2015-01-11 NOTE — Progress Notes (Signed)
Inpatient Diabetes Program Recommendations  AACE/ADA: New Consensus Statement on Inpatient Glycemic Control (2013)  Target Ranges:  Prepandial:   less than 140 mg/dL      Peak postprandial:   less than 180 mg/dL (1-2 hours)      Critically ill patients:  140 - 180 mg/dL    Results for JAKERIA, DHANRAJ (MRN [id number]) as of 01/11/2015 08:33  Ref. Range 01/10/2015 05:48 01/10/2015 12:04 01/10/2015 16:43 01/10/2015 21:36  Glucose-Capillary Latest Ref Range: 65-99 mg/dL 175 (H) 102 (H) 99 585 (H)    Results for IDAMAE, ETHEREDGE (MRN [id number]) as of 01/11/2015 08:33  Ref. Range 01/10/2015 06:20  Hemoglobin A1C Latest Ref Range: 4.8-5.6 % 5.5     Admit with: Depression  History: Morbid Obesity, OSA  Current Orders: Novolog Moderate SSI (0-15 units) tid with meals    **Orders placed for CBGs tid ac + HS and Novolog Moderate SSI.  **Patient has not required any Novolog SSI as of yet. CBGs stable.  **Note A1c 5.5%. This value is Not indicative of a diagnosis of DM per the American Diabetes Association Standards, however, patient would benefit from weight loss education.   Will sign off this patient's case for now.  Please re-consult if further assistance is needed.   Ambrose Finland RN, MSN, CDE Diabetes Coordinator Inpatient Glycemic Control Team Team Pager: [phone] (8a-5p)

## 2015-01-11 NOTE — Progress Notes (Signed)
Recreation Therapy Notes  Animal-Assisted Activity (AAA) Program Checklist/Progress Notes Patient Eligibility Criteria Checklist & Daily Group note for Rec Tx Intervention  Date: 06.14.16 Time: 2:30 pm Location: [address] Dayroom   AAA/T Program Assumption of Risk Form signed by Patient/ or Parent Legal Guardian yes  Patient is free of allergies or sever asthma yes  Patient reports no fear of animals yes  Patient reports no history of cruelty to animals yes  Patient understands his/her participation is voluntary yes  Patient washes hands before animal contact yes  Patient washes hands after animal contact yes  Behavioral Response: Engaged  Education: Hand Washing, Appropriate Animal Interaction   Education Outcome: Acknowledges understanding/In group clarification offered/Needs additional education.   Clinical Observations/Feedback:  Patient attended group.   Yerania Chamorro, LRT/CTRS         Laiken Nohr A 01/11/2015 3:51 PM 

## 2015-01-11 NOTE — Progress Notes (Signed)
Patient ID: Stacey [name], female   DOB: 06-16-1966, 49 y.o.   MRN: [id number] D-In bed this am and is tearful. She completed self inventory and scored self a 6 on feelings of depression and an 8 for hopelessness and anxiety. She bounces her left knee constantly and states this is from anxiety. Not greatly benefitted by the am Klonopin. She did attend group this am. She is quiet but pleasant and verbal. She states she is upset and sad because her husband wont answer her call and the relationship is ending. Offers little. A-Support offered. Monitored for safety medications as ordered.  R-No complaints at this time. Denies current SI.

## 2015-01-11 NOTE — Tx Team (Signed)
Interdisciplinary Treatment Plan Update (Adult) Date: 01/11/2015   Time Reviewed: 9:30 AM  Progress in Treatment: Attending groups: Yes Participating in groups: Yes Taking medication as prescribed: Yes Tolerating medication: Yes Family/Significant other contact made: No, CSW assessing for appropriate contacts Patient understands diagnosis: Yes Discussing patient identified problems/goals with staff: Yes Medical problems stabilized or resolved: Yes Denies suicidal/homicidal ideation: Yes Issues/concerns per patient self-inventory: Yes Other:  New problem(s) identified: N/A  Discharge Plan or Barriers:  6/14: Patient plans to return home to follow up with outpatient services at [facility].  Reason for Continuation of Hospitalization:  Depression Anxiety Medication Stabilization   Comments: N/A  Estimated length of stay: 2-3 days  For review of initial/current patient goals, please see plan of care. Patient is a 49 year old Caucasian female admitted for depression and SI. Patient lives in [place] with her family. Stressors include marital issues, possibility of losing house, and medical issues. Patient follows up with [facility] for outpatient services. Patient will benefit from crisis stabilization, medication evaluation, group therapy, and psycho education in addition to case management for discharge planning. Patient and CSW reviewed pt's identified goals and treatment plan. Pt verbalized understanding and agreed to treatment plan.   Attendees: Patient:    Family:    Physician: Dr. Jama Flavors; Dr. Dub Mikes 01/11/2015 9:30 AM  Nursing: Quintella Reichert, Dellia Cloud, Eino Farber, Waynetta Sandy, RN 01/11/2015 9:30 AM  Clinical Social Worker: Belenda Cruise Yovany Clock,  LCSWA 01/11/2015 9:30 AM  Other: Juline Patch, LCSW 01/11/2015 9:30 AM  Other: Leisa Lenz, Vesta Mixer Liaison 01/11/2015 9:30 AM  Other: Onnie Boer, Case Manager 01/11/2015 9:30 AM  Other: Serena Colonel, NP 01/11/2015  9:30 AM  Other: Chad Cordial, LCSWA 01/11/2015 9:30 AM  Other:    Other:       Scribe for Treatment Team:  [name], MSW, [employer] [phone]

## 2015-01-12 LAB — GLUCOSE, CAPILLARY
GLUCOSE-CAPILLARY: 115 mg/dL — AB (ref 65–99)
GLUCOSE-CAPILLARY: 99 mg/dL (ref 65–99)
Glucose-Capillary: 97 mg/dL (ref 65–99)
Glucose-Capillary: 173 mg/dL — ABNORMAL HIGH (ref 65–99)
Glucose-Capillary: 132 mg/dL — ABNORMAL HIGH (ref 65–99)

## 2015-01-12 LAB — T3, FREE: T3, Free: 3.1 pg/mL (ref 2.0–4.4)

## 2015-01-12 NOTE — Progress Notes (Signed)
Adult Psychoeducational Group Note  Date:  01/12/2015 Time:  9:38 PM  Group Topic/Focus:  Wrap-Up Group:   The focus of this group is to help patients review their daily goal of treatment and discuss progress on daily workbooks.  Participation Level:  Active  Participation Quality:  Appropriate  Affect:  Appropriate  Cognitive:  Alert  Insight: Appropriate  Engagement in Group:  Engaged  Modes of Intervention:  Discussion  Additional Comments:  Pt stated that her day was good, until her son came to visit her. He told her some information that really upset her(did not want to discuss).  Flonnie Hailstone 01/12/2015, 9:38 PM

## 2015-01-12 NOTE — Progress Notes (Addendum)
Patient ID: Stacey [name], female   DOB: May 18, 1966, 49 y.o.   MRN: [id number] [facility] MD Progress Note  01/12/2015 6:28 PM DABNEY DEVER  MRN:  [id number] Subjective:  Patient reports partial improvement. This seems to be related to having made contact via phone with husband. She states that he seemed distant but that he did agree to come in for a family meeting. Denies medication side effects.  Today was more verbal and spoke about how chronic illness, weight gain has limited her ability to function at premorbid levels. She thinks that these factors are also negatively affecting her marriage.   Objective:  I've discussed case with treatment team and have met with patient.  She still presents with a flat blunted affect although today did smile and even laughed briefly. Overall however, still has soft speech, and constricted countenance.  She states she's feeling "a little better" and as noted, spoke about having made phone contact with husband (husband leaving recently had been a major stressor which contributed to depression/admission).  She is going to some groups. Staff reports patient still presents sad and depressed.  As noted, patient more verbal today and reviewed how fibromyalgia and depression have affected her life negatively.  At this time, not endorsing medication side effects. She's interested in having a family meeting with her husband whom she states agreed to this. More responsive to support and empathy at this time. TSH was slightly high but T3 and T4 were within normal.   Principal Problem: Bipolar I disorder, most recent episode (or current) unspecified Diagnosis:   Patient Active Problem List   Diagnosis Date Noted  . Bipolar I disorder, most recent episode (or current) unspecified [F31.9] 01/09/2015  . Panic attacks [F41.0] 01/09/2015  . Headache(784.0) [R51] 07/10/2013  . Depressive disorder [F32.9] 03/08/2013  . Aphthous ulcer [K12.0] 03/01/2013  .  Cellulitis [L03.90] 03/01/2013  . Chest pain [R07.9] 02/27/2013  . Iron deficiency anemia, unspecified [D50.9] 02/24/2013  . Pernicious anemia [D51.0] 02/24/2013  . Abnormality of gait [R26.9] 12/09/2012  . Dysphagia, unspecified(787.20) [R13.10] 12/08/2012  . DUB (dysfunctional uterine bleeding) [N93.8] 05/30/2011  . Anemia [D64.9] 05/30/2011  . COUGH [R05] 03/29/2010  . SKIN RASH [R21] 09/30/2009  . ACUTE BRONCHITIS [J20.9] 08/10/2009  . ALCOHOLISM [F10.20] 08/05/2009  . DIARRHEA [R19.7] 08/05/2009  . SINUSITIS, ACUTE [J01.90] 07/06/2009  . CELIAC SPRUE [K90.0] 07/06/2009  . 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 THYROID NODULE [E04.1] 03/04/2009  . Allergic rhinitis, cause unspecified [J30.9] 01/27/2009  . OBSTRUCTIVE SLEEP APNEA [G47.30] 01/26/2009  . SHORTNESS OF BREATH (SOB) [R06.02] 12/22/2008  . FIBROMYALGIA [M79.1, M60.9] 12/13/2008   Total Time spent with patient: 25 minutes   Past Medical History:  Past Medical History  Diagnosis Date  . Asthma   . Allergic rhinitis, cause unspecified   . Bipolar 1 disorder   . Morbid obesity   . Thyroid nodule   . Mental disorder   . 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 Pt has never been tested.   . Anxiety   . OSA (obstructive sleep apnea)     uses cpap does not know settings  . Complication of anesthesia 2004    bp dropped after c section, took 24hrs to feels leg , numbing medicine does not last long when injected into skin  . Difficult intravenous access     hard stick for ivs and lab per pt  . Nasal bone fracture age 19    left side cannot  place tube in nostril per pt  . Iron deficiency anemia, unspecified 02/24/2013  . Pernicious anemia 02/24/2013  . Anxiety   . Fibromyalgia     Past Surgical History  Procedure Laterality Date  . Tubal ligation    . Cesarean section    . Noraplant    . Wisdom tooth extraction    . Esophagogastroduodenoscopy N/A 01/02/2013    Procedure: ESOPHAGOGASTRODUODENOSCOPY (EGD);   Surgeon: Inda Castle, MD;  Location: [facility];  Service: Endoscopy;  Laterality: N/A;  . Colonoscopy with propofol N/A 01/02/2013    Procedure: COLONOSCOPY WITH PROPOFOL;  Surgeon: Inda Castle, MD;  Location: [facility];  Service: Endoscopy;  Laterality: N/A;   Family History:  Family History  Problem Relation Age of Onset  . Breast cancer Mother   . Asthma Mother   . Emphysema Paternal Grandmother   . Diabetes Father    Social History:  History  Alcohol Use  . Yes    Comment: socially     History  Drug Use No    History   Social History  . Marital Status: Married    Spouse Name: Mutual  . Number of Children: 4  . Years of Education: Bachelors   Occupational History  . Unemployed    Social History Main Topics  . Smoking status: Former Smoker -- 0.50 packs/day for 20 years    Types: Cigarettes    Start date: 02/05/1987    Quit date: 07/30/2006  . Smokeless tobacco: Never Used     Comment: quit 7 years ago  . Alcohol Use: Yes     Comment: socially  . Drug Use: No  . Sexual Activity: Yes    Birth Control/ Protection: Surgical   Other Topics Concern  . None   Social History Narrative   She lives with her husband[place]), 4 children 21, 15, 51, 10, she stays at home.   Patient has a Bachelor's degree.   Patient is right- handed.   Patient drinks one cup of coffee in the morning, sometimes 2 cups.         Additional History:    Sleep: Improved  Appetite:  Improved   Assessment:   Musculoskeletal: Strength & Muscle Tone: within normal limits Gait & Station: normal Patient leans: N/A   Psychiatric Specialty Exam: Physical Exam  ROS denies nausea or vomiting. Has foot tapping during session, states that this is related to anxiety, does not appear related to akathisia.   Blood pressure 124/62, pulse 89, temperature 98.1 F (36.7 C), temperature source Oral, resp. rate 20, height 5' 8.5" (1.74 m), weight 326 lb (147.873 kg), last menstrual  period 12/09/2014, SpO2 100 %.Body mass index is 48.84 kg/(m^2).  General Appearance: Fairly Groomed  Engineer, water::  Improved  Speech:  Slow and but better than upon admission  Volume:  Decreased  Mood:  Depressed but states she feels better.  Affect:  Constricted but brief episodes of affective reactivity.  Thought Process:  Linear  Orientation:  Full (Time, Place, and Person)  Thought Content:  Rumination as noted, ruminative about marital stress. Denies hallucinations and no delusions.  Suicidal Thoughts:  No denies any plan or intention of hurting self on unit  Homicidal Thoughts:  No denies thoughts of hurting husband or anyone else  Memory:  recent and remote grossly intact  Judgement:  Fair  Insight:  Fair  Psychomotor Activity:  Decreased and but foot tapping noted  Concentration:  Fair  Recall:  Good  Fund of Knowledge:Good  Language: Good  Akathisia:  Negative  Handed:  Right  AIMS (if indicated):     Assets:  Resilience  ADL's:  fair  Cognition: WNL  Sleep:  Number of Hours: 6.75     Current Medications: Current Facility-Administered Medications  Medication Dose Route Frequency Provider Last Rate Last Dose  . acetaminophen (TYLENOL) tablet 650 mg  650 mg Oral Q6H PRN Kerrie Buffalo, NP   650 mg at 01/10/15 1540  . albuterol (PROVENTIL HFA;VENTOLIN HFA) 108 (90 BASE) MCG/ACT inhaler 2 puff  2 puff Inhalation TID PRN Kerrie Buffalo, NP      . alum & mag hydroxide-simeth (MAALOX/MYLANTA) 200-200-20 MG/5ML suspension 30 mL  30 mL Oral Q4H PRN Kerrie Buffalo, NP      . beclomethasone (QVAR) 80 MCG/ACT inhaler 2 puff  2 puff Inhalation BID Kerrie Buffalo, NP   2 puff at 01/12/15 0810  . clonazePAM (KLONOPIN) tablet 1 mg  1 mg Oral BID Jenne Campus, MD   1 mg at 01/12/15 1705  . diphenhydrAMINE (BENADRYL) capsule 25 mg  25 mg Oral Q6H PRN Kerrie Buffalo, NP      . divalproex (DEPAKOTE) DR tablet 500 mg  500 mg Oral BID Kerrie Buffalo, NP   500 mg at 01/12/15 0810  .  gabapentin (NEURONTIN) capsule 900 mg  900 mg Oral TID Kerrie Buffalo, NP   900 mg at 01/12/15 1705  . hydrOXYzine (ATARAX/VISTARIL) tablet 25 mg  25 mg Oral TID PRN Kerrie Buffalo, NP   25 mg at 01/12/15 1058  . ibuprofen (ADVIL,MOTRIN) tablet 600 mg  600 mg Oral Q6H PRN Niel Hummer, NP   600 mg at 01/11/15 9983  . magnesium hydroxide (MILK OF MAGNESIA) suspension 30 mL  30 mL Oral Daily PRN Kerrie Buffalo, NP      . methocarbamol (ROBAXIN) tablet 1,000 mg  1,000 mg Oral Q8H PRN Kerrie Buffalo, NP   1,000 mg at 01/11/15 2205  . pantoprazole (PROTONIX) EC tablet 40 mg  40 mg Oral Daily Jenne Campus, MD   40 mg at 01/12/15 0810  . traZODone (DESYREL) tablet 50 mg  50 mg Oral QHS PRN Ursula Alert, MD   50 mg at 01/11/15 2206  . ziprasidone (GEODON) capsule 60 mg  60 mg Oral BID WC Jenne Campus, MD   60 mg at 01/12/15 1705    Lab Results:  Results for orders placed or performed during the hospital encounter of 01/09/15 (from the past 48 hour(s))  Glucose, capillary     Status: Abnormal   Collection Time: 01/10/15  9:36 PM  Result Value Ref Range   Glucose-Capillary 113 (H) 65 - 99 mg/dL  Glucose, capillary     Status: None   Collection Time: 01/11/15  5:50 AM  Result Value Ref Range   Glucose-Capillary 94 65 - 99 mg/dL  T3, free     Status: None   Collection Time: 01/11/15  6:36 AM  Result Value Ref Range   T3, Free 3.1 2.0 - 4.4 pg/mL    Comment: (NOTE) Performed At: [facility] [place], [place] [id number] Lindon Romp MD QB:[id number] Performed at [facility]   T4, free     Status: None   Collection Time: 01/11/15  6:36 AM  Result Value Ref Range   Free T4 0.98 0.61 - 1.12 ng/dL    Comment: Performed at [facility]  Glucose, capillary     Status:  None   Collection Time: 01/11/15 12:10 PM  Result Value Ref Range   Glucose-Capillary 89 65 - 99 mg/dL  Glucose, capillary     Status: None   Collection Time: 01/11/15   5:03 PM  Result Value Ref Range   Glucose-Capillary 76 65 - 99 mg/dL  Glucose, capillary     Status: None   Collection Time: 01/11/15  9:04 PM  Result Value Ref Range   Glucose-Capillary 98 65 - 99 mg/dL  Glucose, capillary     Status: None   Collection Time: 01/12/15  6:02 AM  Result Value Ref Range   Glucose-Capillary 97 65 - 99 mg/dL  Glucose, capillary     Status: Abnormal   Collection Time: 01/12/15  7:53 AM  Result Value Ref Range   Glucose-Capillary 173 (H) 65 - 99 mg/dL   Comment 1 Notify RN    Comment 2 Document in Chart   Glucose, capillary     Status: Abnormal   Collection Time: 01/12/15 11:32 AM  Result Value Ref Range   Glucose-Capillary 115 (H) 65 - 99 mg/dL   Comment 1 Notify RN    Comment 2 Document in Chart   Glucose, capillary     Status: None   Collection Time: 01/12/15  5:13 PM  Result Value Ref Range   Glucose-Capillary 99 65 - 99 mg/dL    Physical Findings: AIMS:  , ,  ,  ,    CIWA:  CIWA-Ar Total: 4 COWS:     Assessment: currently patient remains depressed but there's been some improvement compared to admission as noted in increased verbal output, more affective reactivity, and seeming to be more future oriented. She states she made contact with her husband via phone and is motivated in having a family meeting. Thus far tolerating medications well.   Treatment Plan Summary: Daily contact with patient to assess and evaluate symptoms and progress in treatment, Medication management, Plan Ongoing patient admission and continue medications as below  Continue Depakote ER 500 mgs BID for management of mood disorder. Continue Geodon 60 mgs BID for management of mood disorder. Continue Klonopin 1 mg BID for management of anxiety. Continue Neurontin 900 mgs TID for management of chronic pain. Continue Trazodone 50 mgs QHS PRN for management for insomnia as needed. On PPI for GERD management. Family meeting with husband tentatively scheduled for 11 am  tomorrow.  Valproic acid level ordered.   Medical Decision Making:  Established Problem, Stable/Improving (1), Review of Psycho-Social Stressors (1), Review or order clinical lab tests (1) and Review of Medication Regimen & Side Effects (2)     COBOS, FERNANDO 01/12/2015, 6:28 PM

## 2015-01-12 NOTE — Progress Notes (Signed)
D: Pt passive SI but contracts for safety.  Pt denies HI/AVH. Pt is pleasant and cooperative. Pt very sad and depressed. Pt stated her husband decided to leave her after 20 yrs of marriage Sunday. Pt still in beginning stages of grief.   A: Pt was offered support and encouragement. Pt was given scheduled medications. Pt was encourage to attend groups. Q 15 minute checks were done for safety.    R:Pt attends groups and interacts well with peers and staff. Pt is taking medication. Pt receptive to treatment and safety maintained on unit.

## 2015-01-12 NOTE — [facility] Group Notes (Signed)
[facility] LCSW Group Therapy 01/12/2015  1:15 PM Type of Therapy: Group Therapy Participation Level: Active  Participation Quality: Attentive, Sharing and Supportive  Affect: Depressed and Flat  Cognitive: Alert and Oriented  Insight: Developing/Improving and Engaged  Engagement in Therapy: Developing/Improving and Engaged  Modes of Intervention: Clarification, Confrontation, Discussion, Education, Exploration, Limit-setting, Orientation, Problem-solving, Rapport Building, Dance movement psychotherapist, Socialization and Support  Summary of Progress/Problems: The topic for group today was emotional regulation. This group focused on both positive and negative emotion identification and allowed group members to process ways to identify feelings, regulate negative emotions, and find healthy ways to manage internal/external emotions. Group members were asked to reflect on a time when their reaction to an emotion led to a negative outcome and explored how alternative responses using emotion regulation would have benefited them. Group members were also asked to discuss a time when emotion regulation was utilized when a negative emotion was experienced. Patient actively listened to discussion of emotions, stressors, and coping skills. Patient participated minimally in discussion despite CSW encouragement but did identify relationships as an emotional stressor for her.   [name], MSW, [employer] Clinical Social Worker [facility] [phone]

## 2015-01-12 NOTE — Progress Notes (Signed)
Recreation Therapy Notes  Date: 06.15.16 Time: 9:30 am Location: [address] Group Room  Group Topic: Stress Management  Goal Area(s) Addresses:  Patient will verbalize importance of using healthy stress management.  Patient will identify positive emotions associated with healthy stress management.   Intervention: Stress Management  Activity :  Guided Imagery.  LRT introduced and educated patients on stress management technique of guided imagery.  A script was used to deliver the technique to patients.  Patients were asked to follow the script read aloud by LRT to engage in practicing the technique.  Education:  Stress Management, Discharge Planning.   Clinical Observations/Feedback: Patient did not attend group.  Rahma Meller, LRT/CTRS         Stacey [name] A 01/12/2015 2:10 PM 

## 2015-01-12 NOTE — Plan of Care (Signed)
Problem: Ineffective individual coping Goal: STG: Patient will remain free from self harm Outcome: Progressing Pt safe on the unit.   Problem: Alteration in mood Goal: LTG-Patient reports reduction in suicidal thoughts (Patient reports reduction in suicidal thoughts and is able to verbalize a safety plan for whenever patient is feeling suicidal)  Outcome: Not Progressing Pt passive SI-but contracts for safety

## 2015-01-12 NOTE — Progress Notes (Signed)
Patient ID: Stacey [name], female   DOB: April 13, 1966, 49 y.o.   MRN: [id number] D: Patient denies SI/HI and auditory and visual hallucinations. Patient has a depressed mood and affect states depression is 5 on 1 to 10 scale. Patient stated she was anxious this AM and was given PRN. Patient stated " I'm not suicidal now, but I'm afraid with bad news that I will be again".  A: Patient given emotional support from RN. Patient given medications per MD orders. Patient encouraged to attend groups and unit activities. Patient encouraged to come to staff with any questions or concerns.  R: Patient remains cooperative and appropriate. Will continue to monitor patient for safety.

## 2015-01-12 NOTE — [facility] Group Notes (Signed)
   [facility] LCSW Aftercare Discharge Planning Group Note  01/12/2015  8:45 AM   Participation Quality: Alert, Appropriate and Oriented  Mood/Affect: Depressed and Flat  Depression Rating: 2-3  Anxiety Rating: 6-7  Thoughts of Suicide: Pt denies SI/HI  Will you contract for safety? Yes  Current AVH: Pt denies  Plan for Discharge/Comments: Pt attended discharge planning group and actively participated in group. CSW provided pt with today's workbook. Patient reports feeling "alright" today. She reports hoping to discharge soon. She plans to return home to follow up with outpatient services at [facility].  Transportation Means: Pt reports access to transportation  Supports: No supports mentioned at this time  [name], MSW, [employer] Clinical Social Worker [employer] [phone]

## 2015-01-13 LAB — GLUCOSE, CAPILLARY
GLUCOSE-CAPILLARY: 102 mg/dL — AB (ref 65–99)
GLUCOSE-CAPILLARY: 81 mg/dL (ref 65–99)
GLUCOSE-CAPILLARY: 122 mg/dL — AB (ref 65–99)

## 2015-01-13 LAB — VALPROIC ACID LEVEL: VALPROIC ACID LVL: 54 ug/mL (ref 50.0–100.0)

## 2015-01-13 MED ORDER — TRAZODONE HCL 50 MG PO TABS
50.0000 mg | ORAL_TABLET | Freq: Once | ORAL | Status: AC
  Administered 2015-01-13: 50 mg via ORAL
  Filled 2015-01-13: qty 1

## 2015-01-13 MED ORDER — DULOXETINE HCL 20 MG PO CPEP
20.0000 mg | ORAL_CAPSULE | Freq: Every day | ORAL | Status: DC
  Administered 2015-01-14: 20 mg via ORAL
  Filled 2015-01-13 (×3): qty 1

## 2015-01-13 NOTE — [facility] Group Notes (Signed)
[facility] LCSW Group Therapy 01/13/2015 1:15 PM Type of Therapy: Group Therapy Participation Level: Active  Participation Quality: Attentive, Sharing and Supportive  Affect: Depressed and Flat  Cognitive: Alert and Oriented  Insight: Developing/Improving and Engaged  Engagement in Therapy: Developing/Improving and Engaged  Modes of Intervention: Activity, Clarification, Confrontation, Discussion, Education, Exploration, Limit-setting, Orientation, Problem-solving, Rapport Building, Reality Testing, Socialization and Support  Summary of Progress/Problems: Patient was attentive and engaged with speaker from [employer]. Patient was attentive to speaker while they shared their story of dealing with mental health and overcoming it. Patient expressed interest in their programs and services and received information on their agency. Patient processed ways they can relate to the speaker.   Luciana Cammarata, MSW, LCSWA Clinical Social Worker [facility] [phone]   

## 2015-01-13 NOTE — Progress Notes (Signed)
D: Pt denies SI/AVH.  Pt +ve HI towards husband. Pt is pleasant and cooperative. Pt pt very sad and depressed. Pt still having a hard time processing the break-up.   A: Pt was offered support and encouragement. Pt was given scheduled medications. Pt was encourage to attend groups. Q 15 minute checks were done for safety. Talked about the stages of grieving and to take the process slowly, but don't prolong it.   R:Pt attends groups and interacts well with peers and staff. Pt is taking medication. Pt has no complaints at this time .Pt receptive to treatment and safety maintained on unit.

## 2015-01-13 NOTE — Plan of Care (Signed)
Problem: Ineffective individual coping Goal: STG: Patient will remain free from self harm Outcome: Progressing Pt safe on the unit  Problem: Alteration in mood Goal: LTG-Patient reports reduction in suicidal thoughts (Patient reports reduction in suicidal thoughts and is able to verbalize a safety plan for whenever patient is feeling suicidal)  Outcome: Not Progressing Pt passive SI-contracts for safety

## 2015-01-13 NOTE — Progress Notes (Signed)
Patient ID: Stacey [name], female   DOB: 15-Nov-1965, 49 y.o.   MRN: [id number]  Pt currently presents with a flat affect and depressed behavior. Pt remains anxious and tearful about her relationship with her husband and his visitation today. Pt reports that she and her husband have 6 children, the youngest is there only girl. Per self inventory, pt rates depression at a 4, hopelessness 4 and anxiety 10. Pt's daily goal is to "get through today's session with my husband" and they intend to do so by "mindfulness and prayers." Pt reports poor sleep, poor concentration, low energy and a good appetite.   Pt provided with medications per providers orders. Pt's labs and vitals were monitored throughout the day. Pt supported emotionally and encouraged to express concerns and questions. Pt educated on medications. Pt given a 1:1 post family meeting today.   Pt's safety ensured with 15 minute and environmental checks. Pt currently denies SI/HI and A/V hallucinations. Pt verbally agrees to seek staff if SI/HI or A/VH occurs and to consult with staff before acting on these thoughts. Will continue POC. Pt remains sad post family meeting. Pt attends groups but maintains minimal interaction with staff/peers.

## 2015-01-13 NOTE — Progress Notes (Signed)
Adult Psychoeducational Group Note  Date:  01/13/2015 Time:  09:15am   Group Topic/Focus:  Orientation:   The focus of this group is to educate the patient on the purpose and policies of crisis stabilization and provide a format to answer questions about their admission.  The group details unit policies and expectations of patients while admitted.  Participation Level:  Active  Participation Quality:  Appropriate  Affect:  Flat  Cognitive:  Alert and Appropriate  Insight: Improving  Engagement in Group:  Engaged  Modes of Intervention:  Discussion, Education, Orientation and Support  Additional Comments:  Pt able to identify one daily goal. "I want to not have a melt down when my husband comes for the meeting today." Pt will use a stress ball to cope with increased stress instead of yelling or tensing her body.   Aurora Mask 01/13/2015, 10:32 AM

## 2015-01-13 NOTE — Progress Notes (Signed)
Patient ID: Stacey [name], female   DOB: 06/24/1966, 49 y.o.   MRN: [id number] [facility] MD Progress Note  01/13/2015 1:59 PM Stacey [name]  MRN:  [id number] Subjective: Patient states she continues to feel depressed and anxious. She is denying medication side effects.   Objective:  I've discussed case with treatment team and have met with patient.  Today we had family /couples meeting with patient, SW, and husband. As noted, patient has attributed depression at least partly to marital discord and distancing. She states she has found out that he is " seeing someone else ".  Patient had requested meeting and husband had agreed to it. Meeting was emotional at times, but patient remained calm and polite throughout.  . Husband stated he had difficulty maintaining relationship because of her depression and lack of interaction /intimacy at home.  She stated that she felt  He was becoming more distant, aloof, and not understanding of her depression, and her efforts to overcome it. She did state medications were helping her feel better. They both agreed these issues have been chronic. Husband stated he loved her , and they were able to identify activities they both enjoy doing together, such as gardening, and also their commitment to their children. He stated  That  at this time needed to focus on himself. She expressed sadness about marriage deteriorating but expressed hope that the relationship could be worked on. She does plan to return home for a brief period of time after discharge, and states she is already working with her adult son to get an apartment for her. Writer encouraged them to consider couple's therapy after discharge, and reviewed issues pertaining to depression, anhedonia, and the fact that depression is a treatable condition with likely  improvement with consistent treatment  Patient has been attending groups, as per staff continues to present sad, with constricted, flat affect. She  does ,however, present with increased emotional reactivity compared to admission and she was noted to have more facial expression and mor reactive affect during session. She denies medication side effects. Of note,  As per notes patient had endorsed vague HI towards husband  Earlier - she has denied any thoughts of violence towards husband , denies any thoughts of hurting him, and was calm , non threatening during session.  Fairly responsive to support, encouragement, empathy. Valproic Acid level 54 .   Principal Problem: Bipolar I disorder, most recent episode (or current) unspecified Diagnosis:   Patient Active Problem List   Diagnosis Date Noted  . Bipolar I disorder, most recent episode (or current) unspecified [F31.9] 01/09/2015  . Panic attacks [F41.0] 01/09/2015  . Headache(784.0) [R51] 07/10/2013  . Depressive disorder [F32.9] 03/08/2013  . Aphthous ulcer [K12.0] 03/01/2013  . Cellulitis [L03.90] 03/01/2013  . Chest pain [R07.9] 02/27/2013  . Iron deficiency anemia, unspecified [D50.9] 02/24/2013  . Pernicious anemia [D51.0] 02/24/2013  . Abnormality of gait [R26.9] 12/09/2012  . Dysphagia, unspecified(787.20) [R13.10] 12/08/2012  . DUB (dysfunctional uterine bleeding) [N93.8] 05/30/2011  . Anemia [D64.9] 05/30/2011  . COUGH [R05] 03/29/2010  . SKIN RASH [R21] 09/30/2009  . ACUTE BRONCHITIS [J20.9] 08/10/2009  . ALCOHOLISM [F10.20] 08/05/2009  . DIARRHEA [R19.7] 08/05/2009  . SINUSITIS, ACUTE [J01.90] 07/06/2009  . CELIAC SPRUE [K90.0] 07/06/2009  . 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 THYROID NODULE [E04.1] 03/04/2009  . Allergic rhinitis, cause unspecified [J30.9] 01/27/2009  . OBSTRUCTIVE SLEEP APNEA [G47.30] 01/26/2009  . SHORTNESS OF BREATH (SOB) [R06.02] 12/22/2008  . FIBROMYALGIA [M79.1,  M60.9] 12/13/2008   Total Time spent with patient: 45 minutes- more than  50 % of session spent on counseling and /or disposition planning    Past Medical History:   Past Medical History  Diagnosis Date  . Asthma   . Allergic rhinitis, cause unspecified   . Bipolar 1 disorder   . Morbid obesity   . Thyroid nodule   . Mental disorder   . 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 Pt has never been tested.   . Anxiety   . OSA (obstructive sleep apnea)     uses cpap does not know settings  . Complication of anesthesia 2004    bp dropped after c section, took 24hrs to feels leg , numbing medicine does not last long when injected into skin  . Difficult intravenous access     hard stick for ivs and lab per pt  . Nasal bone fracture age 45    left side cannot place tube in nostril per pt  . Iron deficiency anemia, unspecified 02/24/2013  . Pernicious anemia 02/24/2013  . Anxiety   . Fibromyalgia     Past Surgical History  Procedure Laterality Date  . Tubal ligation    . Cesarean section    . Noraplant    . Wisdom tooth extraction    . Esophagogastroduodenoscopy N/A 01/02/2013    Procedure: ESOPHAGOGASTRODUODENOSCOPY (EGD);  Surgeon: Inda Castle, MD;  Location: [facility];  Service: Endoscopy;  Laterality: N/A;  . Colonoscopy with propofol N/A 01/02/2013    Procedure: COLONOSCOPY WITH PROPOFOL;  Surgeon: Inda Castle, MD;  Location: [facility];  Service: Endoscopy;  Laterality: N/A;   Family History:  Family History  Problem Relation Age of Onset  . Breast cancer Mother   . Asthma Mother   . Emphysema Paternal Grandmother   . Diabetes Father    Social History:  History  Alcohol Use  . Yes    Comment: socially     History  Drug Use No    History   Social History  . Marital Status: Married    Spouse Name: Malakoff  . Number of Children: 4  . Years of Education: Bachelors   Occupational History  . Unemployed    Social History Main Topics  . Smoking status: Former Smoker -- 0.50 packs/day for 20 years    Types: Cigarettes    Start date: 02/05/1987    Quit date: 07/30/2006  . Smokeless tobacco: Never Used     Comment: quit  7 years ago  . Alcohol Use: Yes     Comment: socially  . Drug Use: No  . Sexual Activity: Yes    Birth Control/ Protection: Surgical   Other Topics Concern  . None   Social History Narrative   [facility] 4 children 21, 15, 37, 10, she stays at home.   Patient has a Bachelor's degree.   Patient is right- handed.   Patient drinks one cup of coffee in the morning, sometimes 2 cups.         Additional History:    Sleep: fair   Appetite:  Improved   Assessment:   Musculoskeletal: Strength & Muscle Tone: within normal limits Gait & Station: normal Patient leans: N/A   Psychiatric Specialty Exam: Physical Exam  ROS denies nausea or vomiting.  Blood pressure 145/85, pulse 92, temperature 98.3 F (36.8 C), temperature source Oral, resp. rate 18, height 5' 8.5" (1.74 m), weight 326 lb (147.873  kg), last menstrual period 12/09/2014, SpO2 100 %.Body mass index is 48.84 kg/(m^2).  General Appearance: improved grooming   Eye Contact::  Improved  Speech:  Normal Rate  Volume:  Decreased  Mood:  Depressed but reports mood is improved compared to admission  Affect:  Constricted but brief episodes of affective reactivity.  Thought Process:  Linear  Orientation:  Full (Time, Place, and Person)  Thought Content:  Rumination as noted, ruminative about marital stress. Denies hallucinations and no delusions. Not internally preoccupied   Suicidal Thoughts:  No denies any plan or intention of hurting self on unit  Homicidal Thoughts:  No at this time denies thoughts of hurting husband or anyone else  Memory:  recent and remote grossly intact  Judgement:  Fair  Insight:  improving   Psychomotor Activity:  less slowed, more visible on the unit   Concentration:  Fair  Recall:  [place] of Knowledge:Good  Language: Good  Akathisia:  Negative  Handed:  Right  AIMS (if indicated):     Assets:  Resilience  ADL's:  fair  Cognition: WNL  Sleep:  Number of Hours:  4.75     Current Medications: Current Facility-Administered Medications  Medication Dose Route Frequency Provider Last Rate Last Dose  . acetaminophen (TYLENOL) tablet 650 mg  650 mg Oral Q6H PRN Kerrie Buffalo, NP   650 mg at 01/10/15 1540  . albuterol (PROVENTIL HFA;VENTOLIN HFA) 108 (90 BASE) MCG/ACT inhaler 2 puff  2 puff Inhalation TID PRN Kerrie Buffalo, NP      . alum & mag hydroxide-simeth (MAALOX/MYLANTA) 200-200-20 MG/5ML suspension 30 mL  30 mL Oral Q4H PRN Kerrie Buffalo, NP      . beclomethasone (QVAR) 80 MCG/ACT inhaler 2 puff  2 puff Inhalation BID Kerrie Buffalo, NP   2 puff at 01/13/15 0835  . clonazePAM (KLONOPIN) tablet 1 mg  1 mg Oral BID Jenne Campus, MD   1 mg at 01/13/15 0837  . diphenhydrAMINE (BENADRYL) capsule 25 mg  25 mg Oral Q6H PRN Kerrie Buffalo, NP      . divalproex (DEPAKOTE) DR tablet 500 mg  500 mg Oral BID Kerrie Buffalo, NP   500 mg at 01/13/15 0835  . gabapentin (NEURONTIN) capsule 900 mg  900 mg Oral TID Kerrie Buffalo, NP   900 mg at 01/13/15 1245  . hydrOXYzine (ATARAX/VISTARIL) tablet 25 mg  25 mg Oral TID PRN Kerrie Buffalo, NP   25 mg at 01/12/15 2155  . ibuprofen (ADVIL,MOTRIN) tablet 600 mg  600 mg Oral Q6H PRN Niel Hummer, NP   600 mg at 01/11/15 0569  . magnesium hydroxide (MILK OF MAGNESIA) suspension 30 mL  30 mL Oral Daily PRN Kerrie Buffalo, NP      . methocarbamol (ROBAXIN) tablet 1,000 mg  1,000 mg Oral Q8H PRN Kerrie Buffalo, NP   1,000 mg at 01/12/15 2155  . pantoprazole (PROTONIX) EC tablet 40 mg  40 mg Oral Daily Jenne Campus, MD   40 mg at 01/13/15 0835  . traZODone (DESYREL) tablet 50 mg  50 mg Oral QHS PRN Ursula Alert, MD   50 mg at 01/12/15 2155  . ziprasidone (GEODON) capsule 60 mg  60 mg Oral BID WC Jenne Campus, MD   60 mg at 01/13/15 7948    Lab Results:  Results for orders placed or performed during the hospital encounter of 01/09/15 (from the past 48 hour(s))  Glucose, capillary     Status: None  Collection Time: 01/11/15  5:03 PM  Result Value Ref Range   Glucose-Capillary 76 65 - 99 mg/dL  Glucose, capillary     Status: None   Collection Time: 01/11/15  9:04 PM  Result Value Ref Range   Glucose-Capillary 98 65 - 99 mg/dL  Glucose, capillary     Status: None   Collection Time: 01/12/15  6:02 AM  Result Value Ref Range   Glucose-Capillary 97 65 - 99 mg/dL  Glucose, capillary     Status: Abnormal   Collection Time: 01/12/15  7:53 AM  Result Value Ref Range   Glucose-Capillary 173 (H) 65 - 99 mg/dL   Comment 1 Notify RN    Comment 2 Document in Chart   Glucose, capillary     Status: Abnormal   Collection Time: 01/12/15 11:32 AM  Result Value Ref Range   Glucose-Capillary 115 (H) 65 - 99 mg/dL   Comment 1 Notify RN    Comment 2 Document in Chart   Glucose, capillary     Status: None   Collection Time: 01/12/15  5:13 PM  Result Value Ref Range   Glucose-Capillary 99 65 - 99 mg/dL  Glucose, capillary     Status: Abnormal   Collection Time: 01/12/15  8:58 PM  Result Value Ref Range   Glucose-Capillary 132 (H) 65 - 99 mg/dL  Valproic acid level     Status: None   Collection Time: 01/13/15  6:18 AM  Result Value Ref Range   Valproic Acid Lvl 54 50.0 - 100.0 ug/mL    Comment: Performed at [facility]  Glucose, capillary     Status: Abnormal   Collection Time: 01/13/15  6:23 AM  Result Value Ref Range   Glucose-Capillary 102 (H) 65 - 99 mg/dL    Physical Findings: AIMS:  , ,  ,  ,    CIWA:  CIWA-Ar Total: 4 COWS:     Assessment:  Patient reports history of Bipolar Disorder- report from husband is consistent with chronic depression and some episodes of anger, explosiveness. Patient is tolerating medications well at present. She remains blunted and depressed, but currently not suicidal.  She identifies marital discord, separation as a major stressor- we had  A  family meeting with  Patient and husband today - he has recently separated from her. In spite  of this stressor, she is currently future oriented and states she is planning to move into an apartment soon after discharge. She is denying any current SI or HI. Tolerating medications well, and denies medication side effects. Valproic Acid on low therapeutic range   Treatment Plan Summary: Daily contact with patient to assess and evaluate symptoms and progress in treatment, Medication management, Plan Ongoing patient admission and continue medications as below  Continue Depakote ER 500 mgs BID for management of mood disorder. Continue Geodon 60 mgs BID for management of mood disorder. Continue Klonopin 1 mg BID for management of anxiety. Continue Neurontin 900 mgs TID for management of chronic pain. Continue Trazodone 50 mgs QHS PRN for management for insomnia as needed. On PPI for GERD management.   Medical Decision Making:  Established Problem, Stable/Improving (1), Review of Psycho-Social Stressors (1), Review or order clinical lab tests (1) and Review of Medication Regimen & Side Effects (2)     COBOS, FERNANDO 01/13/2015, 1:59 PM

## 2015-01-13 NOTE — Plan of Care (Signed)
Problem: Alteration in mood Goal: STG-Patient is able to discuss feelings and issues (Patient is able to discuss feelings and issues leading to depression)  Outcome: Progressing Pt identifies stressors in her life and is open to trying new coping skills to handle them.

## 2015-01-13 NOTE — Plan of Care (Signed)
Problem: Ineffective individual coping Goal: LTG: Patient will report a decrease in negative feelings Outcome: Not Progressing Pt still having hard time getting through sadness and increased depression due to the break-up  Problem: Alteration in mood Goal: LTG-Patient reports reduction in suicidal thoughts (Patient reports reduction in suicidal thoughts and is able to verbalize a safety plan for whenever patient is feeling suicidal)  Outcome: Progressing Pt denies SI at this time

## 2015-01-13 NOTE — [facility] Group Notes (Signed)
Patient attended karaoke group.  

## 2015-01-13 NOTE — Progress Notes (Signed)
D: Pt denies AVH. Pt is pleasant and cooperative. Pt +ve HI, Passive SI-contracts for safety. Pt was feeling bad earlier after finding out her husband already has a girlfriend and she is also married  A: Pt was offered support and encouragement. Pt was given scheduled medications. Pt was encourage to attend groups. Q 15 minute checks were done for safety.   R:Pt attends groups and interacts well with peers and staff. Pt is taking medication. Pt has no complaints at this time  .Pt receptive to treatment and safety maintained on unit.

## 2015-01-14 LAB — GLUCOSE, CAPILLARY
GLUCOSE-CAPILLARY: 100 mg/dL — AB (ref 65–99)
GLUCOSE-CAPILLARY: 107 mg/dL — AB (ref 65–99)
GLUCOSE-CAPILLARY: 97 mg/dL (ref 65–99)
Glucose-Capillary: 105 mg/dL — ABNORMAL HIGH (ref 65–99)

## 2015-01-14 MED ORDER — DULOXETINE HCL 30 MG PO CPEP
30.0000 mg | ORAL_CAPSULE | Freq: Every day | ORAL | Status: DC
  Administered 2015-01-15: 30 mg via ORAL
  Filled 2015-01-14 (×3): qty 1

## 2015-01-14 MED ORDER — ZIPRASIDONE HCL 80 MG PO CAPS
80.0000 mg | ORAL_CAPSULE | Freq: Two times a day (BID) | ORAL | Status: DC
  Administered 2015-01-14 – 2015-01-21 (×14): 80 mg via ORAL
  Filled 2015-01-14 (×2): qty 1
  Filled 2015-01-14: qty 6
  Filled 2015-01-14 (×13): qty 1
  Filled 2015-01-14: qty 6
  Filled 2015-01-14 (×2): qty 1

## 2015-01-14 NOTE — Tx Team (Signed)
Interdisciplinary Treatment Plan Update (Adult) Date: 01/14/2015   Time Reviewed: 9:30 AM  Progress in Treatment: Attending groups: Yes Participating in groups: Minimally Taking medication as prescribed: Yes Tolerating medication: Yes Family/Significant other contact made: Yes, CSW has spoken with patient's husband Patient understands diagnosis: Yes Discussing patient identified problems/goals with staff: Yes Medical problems stabilized or resolved: Yes Denies suicidal/homicidal ideation: Patient is endorsing SI Issues/concerns per patient self-inventory: Yes Other:  New problem(s) identified: N/A  Discharge Plan or Barriers:  6/14: Patient plans to return home to follow up with outpatient services at [facility].   6/17: Patient plans to return home to follow up with outpatient services at [facility]. Patient continues to appear flat and depressed and is endorsing SI at this time.  Reason for Continuation of Hospitalization:  Depression Anxiety Medication Stabilization   Comments: N/A  Estimated length of stay: 2-3 days  For review of initial/current patient goals, please see plan of care. Patient is a 49 year old Caucasian female admitted for depression and SI. Patient lives in [place] with her family. Stressors include marital issues, possibility of losing house, and medical issues. Patient follows up with [facility] for outpatient services. Patient will benefit from crisis stabilization, medication evaluation, group therapy, and psycho education in addition to case management for discharge planning. Patient and CSW reviewed pt's identified goals and treatment plan. Pt verbalized understanding and agreed to treatment plan.   Attendees: Patient:    Family:    Physician: Dr. Jama Flavors; Dr. Dub Mikes 01/14/2015 9:30 AM  Nursing: Dellia Cloud, [address] Patty Duke, Vivi Ferns, RN 01/14/2015 9:30 AM  Clinical Social Worker: [name],  LCSWA  01/14/2015 9:30 AM  Other: Juline Patch, LCSW 01/14/2015 9:30 AM  Other: Leisa Lenz, Vesta Mixer Liaison 01/14/2015 9:30 AM  Other: Onnie Boer, Case Manager 01/14/2015 9:30 AM  Other: Mosetta Anis NP 01/14/2015 9:30 AM  Other: Chad Cordial, LCSWA 01/14/2015 9:30 AM  Other:    Other:       Scribe for Treatment Team:  [name], MSW, [employer] [phone]

## 2015-01-14 NOTE — Progress Notes (Signed)
Patient ID: Stacey [name], female   DOB: 10-18-1965, 49 y.o.   MRN: [id number] [facility] MD Progress Note  01/14/2015 8:56 AM Stacey [name]  MRN:  [id number] Subjective: patient states she feels depressed. She denies any medication side effects. Of note, chart notes indicate that she had expressed some HI toward husband yesterday. Today denies and states she's angry with him but that she has no plan or intention of hurting him. She has endorsed passive SI but denies plan or intention of self-harm and contracts for safety on unit. She states that she did go to [employer] group yesterday and was actually able to sing which she states helped her feel better "for a little bit".    Objective: I have discussed case with treatment team and have met with patient. Patient remains depressed and blunted in affect. As noted, affect does seem to be more reactive depending on situation. She remains ruminative about her marital stress and current separation. As noted, she denies any homicidal ideations towards husband at this time.  She is not exhibiting any agitated behaviors on unit.  She has been going to some groups and as noted, has endorsed passive SI.  So far she's tolerating medications well. She states medications have helped " a little".  Principal Problem: Bipolar I disorder, most recent episode (or current) unspecified Diagnosis:   Patient Active Problem List   Diagnosis Date Noted  . Bipolar I disorder, most recent episode (or current) unspecified [F31.9] 01/09/2015  . Panic attacks [F41.0] 01/09/2015  . Headache(784.0) [R51] 07/10/2013  . Depressive disorder [F32.9] 03/08/2013  . Aphthous ulcer [K12.0] 03/01/2013  . Cellulitis [L03.90] 03/01/2013  . Chest pain [R07.9] 02/27/2013  . Iron deficiency anemia, unspecified [D50.9] 02/24/2013  . Pernicious anemia [D51.0] 02/24/2013  . Abnormality of gait [R26.9] 12/09/2012  . Dysphagia, unspecified(787.20) [R13.10] 12/08/2012  . DUB  (dysfunctional uterine bleeding) [N93.8] 05/30/2011  . Anemia [D64.9] 05/30/2011  . COUGH [R05] 03/29/2010  . SKIN RASH [R21] 09/30/2009  . ACUTE BRONCHITIS [J20.9] 08/10/2009  . ALCOHOLISM [F10.20] 08/05/2009  . DIARRHEA [R19.7] 08/05/2009  . SINUSITIS, ACUTE [J01.90] 07/06/2009  . CELIAC SPRUE [K90.0] 07/06/2009  . 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 THYROID NODULE [E04.1] 03/04/2009  . Allergic rhinitis, cause unspecified [J30.9] 01/27/2009  . OBSTRUCTIVE SLEEP APNEA [G47.30] 01/26/2009  . SHORTNESS OF BREATH (SOB) [R06.02] 12/22/2008  . FIBROMYALGIA [M79.1, M60.9] 12/13/2008   Total Time spent with patient: 20 minutes    Past Medical History:  Past Medical History  Diagnosis Date  . Asthma   . Allergic rhinitis, cause unspecified   . Bipolar 1 disorder   . Morbid obesity   . Thyroid nodule   . Mental disorder   . 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 Pt has never been tested.   . Anxiety   . OSA (obstructive sleep apnea)     uses cpap does not know settings  . Complication of anesthesia 2004    bp dropped after c section, took 24hrs to feels leg , numbing medicine does not last long when injected into skin  . Difficult intravenous access     hard stick for ivs and lab per pt  . Nasal bone fracture age 53    left side cannot place tube in nostril per pt  . Iron deficiency anemia, unspecified 02/24/2013  . Pernicious anemia 02/24/2013  . Anxiety   . Fibromyalgia     Past Surgical History  Procedure Laterality Date  .  Tubal ligation    . Cesarean section    . Noraplant    . Wisdom tooth extraction    . Esophagogastroduodenoscopy N/A 01/02/2013    Procedure: ESOPHAGOGASTRODUODENOSCOPY (EGD);  Surgeon: Inda Castle, MD;  Location: [facility];  Service: Endoscopy;  Laterality: N/A;  . Colonoscopy with propofol N/A 01/02/2013    Procedure: COLONOSCOPY WITH PROPOFOL;  Surgeon: Inda Castle, MD;  Location: [facility];  Service: Endoscopy;  Laterality:  N/A;   Family History:  Family History  Problem Relation Age of Onset  . Breast cancer Mother   . Asthma Mother   . Emphysema Paternal Grandmother   . Diabetes Father    Social History:  History  Alcohol Use  . Yes    Comment: socially     History  Drug Use No    History   Social History  . Marital Status: Married    Spouse Name: [place]  . Number of Children: 4  . Years of Education: Bachelors   Occupational History  . Unemployed    Social History Main Topics  . Smoking status: Former Smoker -- 0.50 packs/day for 20 years    Types: Cigarettes    Start date: 02/05/1987    Quit date: 07/30/2006  . Smokeless tobacco: Never Used     Comment: quit 7 years ago  . Alcohol Use: Yes     Comment: socially  . Drug Use: No  . Sexual Activity: Yes    Birth Control/ Protection: Surgical   Other Topics Concern  . None   Social History Narrative   [facility] 4 children 21, 15, 3, 10, she stays at home.   Patient has a Bachelor's degree.   Patient is right- handed.   Patient drinks one cup of coffee in the morning, sometimes 2 cups.         Additional History:    Sleep: Improved  Appetite:  Improved   Assessment:   Musculoskeletal: Strength & Muscle Tone: within normal limits Gait & Station: normal Patient leans: N/A   Psychiatric Specialty Exam: Physical Exam  ROS denies nausea or vomiting.  Blood pressure 118/58, pulse 93, temperature 98.3 F (36.8 C), temperature source Oral, resp. rate 18, height 5' 8.5" (1.74 m), weight 326 lb (147.873 kg), last menstrual period 12/09/2014, SpO2 100 %.Body mass index is 48.84 kg/(m^2).  General Appearance: fairly groomed  Engineer, water::  Improved  Speech:  Normal Rate  Volume:  Decreased  Mood:  Depressed  Affect:  Constricted , blunted, but brief episodes of affective reactivity.  Thought Process:  Linear  Orientation:  Full (Time, Place, and Person)  Thought Content:  Rumination as noted,  ruminative about marital stress. Denies hallucinations and no delusions. Not internally preoccupied   Suicidal Thoughts:  Yes.  without intent/plan denies any plan or intention of hurting self on unit  Homicidal Thoughts:  No at this time denies thoughts of hurting husband or anyone else  Memory:  recent and remote grossly intact  Judgement:  Fair  Insight:  improving   Psychomotor Activity:  less slowed, more visible on the unit   Concentration:  Fair  Recall:  [place] of Knowledge:Good  Language: Good  Akathisia:  Negative  Handed:  Right  AIMS (if indicated):     Assets:  Resilience  ADL's:  fair  Cognition: WNL  Sleep:  Number of Hours: 6.75     Current Medications: Current Facility-Administered Medications  Medication Dose Route  Frequency Provider Last Rate Last Dose  . acetaminophen (TYLENOL) tablet 650 mg  650 mg Oral Q6H PRN Kerrie Buffalo, NP   650 mg at 01/10/15 1540  . albuterol (PROVENTIL HFA;VENTOLIN HFA) 108 (90 BASE) MCG/ACT inhaler 2 puff  2 puff Inhalation TID PRN Kerrie Buffalo, NP      . alum & mag hydroxide-simeth (MAALOX/MYLANTA) 200-200-20 MG/5ML suspension 30 mL  30 mL Oral Q4H PRN Kerrie Buffalo, NP      . beclomethasone (QVAR) 80 MCG/ACT inhaler 2 puff  2 puff Inhalation BID Kerrie Buffalo, NP   2 puff at 01/14/15 0818  . clonazePAM (KLONOPIN) tablet 1 mg  1 mg Oral BID Jenne Campus, MD   1 mg at 01/14/15 5465  . diphenhydrAMINE (BENADRYL) capsule 25 mg  25 mg Oral Q6H PRN Kerrie Buffalo, NP      . divalproex (DEPAKOTE) DR tablet 500 mg  500 mg Oral BID Kerrie Buffalo, NP   500 mg at 01/14/15 0819  . DULoxetine (CYMBALTA) DR capsule 20 mg  20 mg Oral Daily Jenne Campus, MD   20 mg at 01/14/15 0819  . gabapentin (NEURONTIN) capsule 900 mg  900 mg Oral TID Kerrie Buffalo, NP   900 mg at 01/14/15 0818  . hydrOXYzine (ATARAX/VISTARIL) tablet 25 mg  25 mg Oral TID PRN Kerrie Buffalo, NP   25 mg at 01/13/15 2153  . ibuprofen (ADVIL,MOTRIN) tablet 600 mg   600 mg Oral Q6H PRN Niel Hummer, NP   600 mg at 01/11/15 0354  . magnesium hydroxide (MILK OF MAGNESIA) suspension 30 mL  30 mL Oral Daily PRN Kerrie Buffalo, NP      . methocarbamol (ROBAXIN) tablet 1,000 mg  1,000 mg Oral Q8H PRN Kerrie Buffalo, NP   1,000 mg at 01/14/15 6568  . pantoprazole (PROTONIX) EC tablet 40 mg  40 mg Oral Daily Jenne Campus, MD   40 mg at 01/14/15 0819  . traZODone (DESYREL) tablet 50 mg  50 mg Oral QHS PRN Ursula Alert, MD   50 mg at 01/13/15 2153  . ziprasidone (GEODON) capsule 60 mg  60 mg Oral BID WC Jenne Campus, MD   60 mg at 01/14/15 1275    Lab Results:  Results for orders placed or performed during the hospital encounter of 01/09/15 (from the past 48 hour(s))  Glucose, capillary     Status: Abnormal   Collection Time: 01/12/15 11:32 AM  Result Value Ref Range   Glucose-Capillary 115 (H) 65 - 99 mg/dL   Comment 1 Notify RN    Comment 2 Document in Chart   Glucose, capillary     Status: None   Collection Time: 01/12/15  5:13 PM  Result Value Ref Range   Glucose-Capillary 99 65 - 99 mg/dL  Glucose, capillary     Status: Abnormal   Collection Time: 01/12/15  8:58 PM  Result Value Ref Range   Glucose-Capillary 132 (H) 65 - 99 mg/dL  Valproic acid level     Status: None   Collection Time: 01/13/15  6:18 AM  Result Value Ref Range   Valproic Acid Lvl 54 50.0 - 100.0 ug/mL    Comment: Performed at [facility]  Glucose, capillary     Status: Abnormal   Collection Time: 01/13/15  6:23 AM  Result Value Ref Range   Glucose-Capillary 102 (H) 65 - 99 mg/dL  Glucose, capillary     Status: None   Collection Time: 01/13/15  5:20  PM  Result Value Ref Range   Glucose-Capillary 81 65 - 99 mg/dL  Glucose, capillary     Status: Abnormal   Collection Time: 01/13/15  7:44 PM  Result Value Ref Range   Glucose-Capillary 122 (H) 65 - 99 mg/dL  Glucose, capillary     Status: Abnormal   Collection Time: 01/14/15  6:02 AM  Result Value  Ref Range   Glucose-Capillary 100 (H) 65 - 99 mg/dL    Physical Findings: AIMS:  , ,  ,  ,    CIWA:  CIWA-Ar Total: 4 COWS:     Assessment:  Patient remains significantly depressed and blunted in affect. Depression is chronic and it is difficult to assess how far from prior baseline she is at this time. Patient endorses some improvement on medications but continues to present with significant depression and today endorsing passive thoughts of death although denying any plan or intention of hurting herself. Of note, yesterday had expressed vague HI toward husband but today denies any thoughts of hurting him. We have reviewed medications, she is tolerating them well, and states she feels optimistic that they're continuing to work. Of note, has been on Geodon before with good response.   Treatment Plan Summary: Daily contact with patient to assess and evaluate symptoms and progress in treatment, Medication management, Plan Ongoing patient admission and continue medications as below  Continue Depakote ER 500 mgs BID for management of mood disorder. Increase Geodon 80 mgs BID for management of mood disorder. Continue Klonopin 1 mg BID for management of anxiety. Continue Neurontin 900 mgs TID for management of chronic pain. Continue Trazodone 50 mgs QHS PRN for management for insomnia as needed. Increase Cymbalta to 30 mgs QDAY for management of depression. On PPI for GERD management.   Medical Decision Making:  Established Problem, Stable/Improving (1), Review of Psycho-Social Stressors (1), Review or order clinical lab tests (1) and Review of Medication Regimen & Side Effects (2)     ,  01/14/2015, 8:56 AM

## 2015-01-14 NOTE — Plan of Care (Signed)
Problem: Ineffective individual coping Goal: LTG: Patient will report a decrease in negative feelings Outcome: Not Progressing Pt feeling sad and depressed and pre-occupied with her situation.   Problem: Alteration in mood Goal: LTG-Patient reports reduction in suicidal thoughts (Patient reports reduction in suicidal thoughts and is able to verbalize a safety plan for whenever patient is feeling suicidal)  Outcome: Not Progressing Pt passive SI-but contracts for safety.

## 2015-01-14 NOTE — [facility] Group Notes (Signed)
[facility] LCSW Group Therapy 01/14/2015 1:15 PM Type of Therapy: Group Therapy Participation Level: Active  Participation Quality: Attentive, Sharing and Supportive  Affect: Depressed and Flat  Cognitive: Alert and Oriented  Insight: Developing/Improving and Engaged  Engagement in Therapy: Developing/Improving and Engaged  Modes of Intervention: Clarification, Confrontation, Discussion, Education, Exploration, Limit-setting, Orientation, Problem-solving, Rapport Building, Dance movement psychotherapist, Socialization and Support  Summary of Progress/Problems: The topic for today was feelings about relapse. Pt discussed what relapse prevention is to them and identified triggers that they are on the path to relapse. Pt processed their feeling towards relapse and was able to relate to peers. Pt discussed coping skills that can be used for relapse prevention. Patient participated in discussion minimally but was observed actively listening during discussion. She did share that she hopes to find new living arrangements at discharge to promote her recovery. CSW and other group members provided patient with emotional support and encouragement.   [name], MSW, [employer] Clinical Social Worker [facility] [phone]

## 2015-01-14 NOTE — Progress Notes (Signed)
EKG completed per MD order; placed inside chart.

## 2015-01-14 NOTE — Progress Notes (Signed)
Adult Psychoeducational Group Note  Date:  01/14/2015 Time:  9:38 PM  Group Topic/Focus:  Wrap-Up Group:   The focus of this group is to help patients review their daily goal of treatment and discuss progress on daily workbooks.  Participation Level:  Minimal  Participation Quality:  Appropriate  Affect:  Depressed  Cognitive:  Alert  Insight: Appropriate  Engagement in Group:  Lacking  Modes of Intervention:  Discussion  Additional Comments:  Pt stated that today has been hard for her. She slept a lot today. She doesn't feel that the groups are much help because she needs more one on one attention.   Flonnie Hailstone 01/14/2015, 9:38 PM

## 2015-01-14 NOTE — Progress Notes (Signed)
D: Depressed, flat affect. Per self inventory reports she is sleeping good with the use of sleep medication. She reports a good appetite, low energy level, and poor concentration. She rates depression 6/10, hopelessness 7/10, anxiety 8/10 , all on 1-10 scale, 10 being the worse. She reports passive SI- able to verbally contract for safety. She reports physical pain in her back. Slow movements observed, HI not reported. Went to group this morning.  A: Encouraged to get OOB, Emotional support provided. Special checks q 15 mins in place for safety. Medication administered per MD order (see eMAR). PRN medication administered for back pain.    R: In bed a lot this shift, safety maintained, compliant with medication regimen.

## 2015-01-14 NOTE — Progress Notes (Signed)
D:  Passive SI-contracts for safety. Pt denies /HI/AVH. Pt is flat and blunted. Pt presents very sad and depressed, but will brighten sometimes on approach. Pt seems to be focused and pre-occupied with things going on in her life. Pt forwards little today and keeps to herself even when sitting in the dayroom.    A: Pt was offered support and encouragement. Pt was given scheduled medications. Pt was encourage to attend groups. Q 15 minute checks were done for safety.   R:Pt attends groups and interacts well with peers and staff. Pt is taking medication. Pt has no complaints at this time .Pt receptive to treatment and safety maintained on unit.

## 2015-01-14 NOTE — Progress Notes (Signed)
Patient in bed sleeping, no s/s of acute distress noted.

## 2015-01-14 NOTE — [facility] Group Notes (Signed)
   [facility] LCSW Aftercare Discharge Planning Group Note  01/14/2015  8:45 AM   Participation Quality: Alert, Appropriate and Oriented  Mood/Affect: Depressed and Flat  Depression Rating: 6-7  Anxiety Rating: 7-8  Thoughts of Suicide: Pt endorses SI  Will you contract for safety? Yes  Current AVH: Pt denies  Plan for Discharge/Comments: Pt attended discharge planning group and actively participated in group. CSW provided pt with today's workbook. Patient reports feeling "upset" today. She is also endorsing SI but contracts for safety. She plans to return home to follow up with [facility].  Transportation Means: Pt reports access to transportation  Supports: No supports mentioned at this time  [name], MSW, [employer] Clinical Social Worker [employer] [phone]

## 2015-01-14 NOTE — Progress Notes (Signed)
Recreation Therapy Notes  Date: 06.17.16 Time: 09:30 am Location: [address] Group Room  Group Topic: Stress Management  Goal Area(s) Addresses:  Patient will verbalize importance of using healthy stress management.  Patient will identify positive emotions associated with healthy stress management.   Intervention: Stress Management  Activity :  Progressive Muscle Relaxation.  LRT introduced and educated patients on stress management technique of progressive muscle relaxation.  A script was used to deliver the technique to patients.  Patients were asked to follow script read a loud by LRT to engage in the stress management technique.  Education:  Stress Management, Discharge Planning.   Clinical Observations/Feedback: Patient did not attend group.    Belvia Gotschall, LRT/CTRS         Shenika Quint A 01/14/2015 1:22 PM 

## 2015-01-14 NOTE — Plan of Care (Signed)
Problem: Diagnosis: Increased Risk For Suicide Attempt Goal: STG-Patient Will Comply With Medication Regime Outcome: Progressing Patient has been compliant with medication regimen.      

## 2015-01-15 LAB — GLUCOSE, CAPILLARY
GLUCOSE-CAPILLARY: 87 mg/dL (ref 65–99)
GLUCOSE-CAPILLARY: 123 mg/dL — AB (ref 65–99)

## 2015-01-15 MED ORDER — DULOXETINE HCL 60 MG PO CPEP
60.0000 mg | ORAL_CAPSULE | Freq: Every day | ORAL | Status: DC
  Administered 2015-01-16 – 2015-01-21 (×6): 60 mg via ORAL
  Filled 2015-01-15: qty 3
  Filled 2015-01-15 (×8): qty 1

## 2015-01-15 NOTE — Progress Notes (Signed)
Patient ID: Stacey [name], female   DOB: 08-08-1965, 49 y.o.   MRN: [id number] D: Patient states shill is still having difficulty sleeping; her appetite is fair.  Her energy and concentration is low.  She rates her depression as a 5; hopelessness as a 5; anxiety as a 7.  Patient denies any SI/HI/AVH.  Patient feels she is improving with her treatment.  She attended group and participated.  Her goal today is "to take away tools I can use from our group sessions."  Her concern about discharge is lack of resources for medication and doctors.  Patient is interacting well with staff and peers. A: Continue to monitor medication management and MD orders.  Safety checks completed every 15 minutes per protocol.  Meet 1:1 with patient to discuss concerns and offer encouragement. R: Patient is receptive to staff.

## 2015-01-15 NOTE — Progress Notes (Signed)
The focus of this group is to help patients review their daily goal of treatment and discuss progress on daily workbooks. Pt attended the evening group session but responded minimally to discussion prompts from the Writer. Pt shared that today was a good day, the highlight of which were conversations with her roommate with whom she gets along well. Pt's roommate chimed in, "I actually got [name] to talk some today," to which Stacey [name] smiled. Pt's only additional request this evening from Nursing Staff was for towels, which were given to her following group. Pt's affect was flat.

## 2015-01-15 NOTE — Progress Notes (Addendum)
Patient ID: MILENA LIGGETT, female   DOB: April 05, 1966, 49 y.o.   MRN: [id number] [facility] MD Progress Note  01/15/2015 4:16 PM ILANA PREZIOSO  MRN:  [id number] Subjective: patient reports she continues to feel very depressed . She states she struggles with a sense of hopelessness. She has had some passive thoughts of wanting to die,but denies any plan or intention of hurting self on unit and contracts for safety. States the thought of discharging is subjectively overwhelming to her, particularly now that she knows her husband wants separation. She denies medication side effects and is tolerating medications well.   Objective: I have discussed case with treatment team and have met with patient.  She has been visible on unit, going to groups, and visible in day room, but tends to keep to self, with limited participation or interactions with others. She is denying medication side effects- medications have been adjusted recently due to persistent depression. She was started on Cymbalta and thus far no side effects.  Also, has tolerated Geodon titration well.. Of note, patient identifies Geodon as a  Medication which has been effective for her in the past, and denies side effects from it. She has not exhibited any self injurious behaviors or any disruptive behaviors on the unit. Partial response to support, empathy, affect does improve slightly during session. Of note, patient stats she has been diagnosed with Vitamin Deficiencies in the past, remembers being told she had B12 deficiency. Has not been taking supplement recently.   Principal Problem: Bipolar I disorder, most recent episode (or current) unspecified Diagnosis:   Patient Active Problem List   Diagnosis Date Noted  . Bipolar I disorder, most recent episode (or current) unspecified [F31.9] 01/09/2015  . Panic attacks [F41.0] 01/09/2015  . Headache(784.0) [R51] 07/10/2013  . Depressive disorder [F32.9] 03/08/2013  . Aphthous ulcer  [K12.0] 03/01/2013  . Cellulitis [L03.90] 03/01/2013  . Chest pain [R07.9] 02/27/2013  . Iron deficiency anemia, unspecified [D50.9] 02/24/2013  . Pernicious anemia [D51.0] 02/24/2013  . Abnormality of gait [R26.9] 12/09/2012  . Dysphagia, unspecified(787.20) [R13.10] 12/08/2012  . DUB (dysfunctional uterine bleeding) [N93.8] 05/30/2011  . Anemia [D64.9] 05/30/2011  . COUGH [R05] 03/29/2010  . SKIN RASH [R21] 09/30/2009  . ACUTE BRONCHITIS [J20.9] 08/10/2009  . ALCOHOLISM [F10.20] 08/05/2009  . DIARRHEA [R19.7] 08/05/2009  . SINUSITIS, ACUTE [J01.90] 07/06/2009  . CELIAC SPRUE [K90.0] 07/06/2009  . 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 THYROID NODULE [E04.1] 03/04/2009  . Allergic rhinitis, cause unspecified [J30.9] 01/27/2009  . OBSTRUCTIVE SLEEP APNEA [G47.30] 01/26/2009  . SHORTNESS OF BREATH (SOB) [R06.02] 12/22/2008  . FIBROMYALGIA [M79.1, M60.9] 12/13/2008   Total Time spent with patient: 20 minutes    Past Medical History:  Past Medical History  Diagnosis Date  . Asthma   . Allergic rhinitis, cause unspecified   . Bipolar 1 disorder   . Morbid obesity   . Thyroid nodule   . Mental disorder   . 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 Pt has never been tested.   . Anxiety   . OSA (obstructive sleep apnea)     uses cpap does not know settings  . Complication of anesthesia 2004    bp dropped after c section, took 24hrs to feels leg , numbing medicine does not last long when injected into skin  . Difficult intravenous access     hard stick for ivs and lab per pt  . Nasal bone fracture age 71    left  side cannot place tube in nostril per pt  . Iron deficiency anemia, unspecified 02/24/2013  . Pernicious anemia 02/24/2013  . Anxiety   . Fibromyalgia     Past Surgical History  Procedure Laterality Date  . Tubal ligation    . Cesarean section    . Noraplant    . Wisdom tooth extraction    . Esophagogastroduodenoscopy N/A 01/02/2013    Procedure:  ESOPHAGOGASTRODUODENOSCOPY (EGD);  Surgeon: Inda Castle, MD;  Location: [facility];  Service: Endoscopy;  Laterality: N/A;  . Colonoscopy with propofol N/A 01/02/2013    Procedure: COLONOSCOPY WITH PROPOFOL;  Surgeon: Inda Castle, MD;  Location: [facility];  Service: Endoscopy;  Laterality: N/A;   Family History:  Family History  Problem Relation Age of Onset  . Breast cancer Mother   . Asthma Mother   . Emphysema Paternal Grandmother   . Diabetes Father    Social History:  History  Alcohol Use  . Yes    Comment: socially     History  Drug Use No    History   Social History  . Marital Status: Married    Spouse Name: [place]  . Number of Children: 4  . Years of Education: Bachelors   Occupational History  . Unemployed    Social History Main Topics  . Smoking status: Former Smoker -- 0.50 packs/day for 20 years    Types: Cigarettes    Start date: 02/05/1987    Quit date: 07/30/2006  . Smokeless tobacco: Never Used     Comment: quit 7 years ago  . Alcohol Use: Yes     Comment: socially  . Drug Use: No  . Sexual Activity: Yes    Birth Control/ Protection: Surgical   Other Topics Concern  . None   Social History Narrative   [facility] 4 children 21, 15, 51, 10, she stays at home.   Patient has a Bachelor's degree.   Patient is right- handed.   Patient drinks one cup of coffee in the morning, sometimes 2 cups.         Additional History:    Sleep: Improved  Appetite:  Improved   Assessment:   Musculoskeletal: Strength & Muscle Tone: within normal limits Gait & Station: normal Patient leans: N/A   Psychiatric Specialty Exam: Physical Exam  ROS denies nausea or vomiting.  Blood pressure 148/84, pulse 88, temperature 97.7 F (36.5 C), temperature source Oral, resp. rate 20, height 5' 8.5" (1.74 m), weight 326 lb (147.873 kg), last menstrual period 12/09/2014, SpO2 100 %.Body mass index is 48.84 kg/(m^2).  General  Appearance: fairly groomed  Engineer, water::  Improved  Speech:  Normal Rate  Volume:  Decreased  Mood:  Remains depressed   Affect:  Constricted   Thought Process:  Linear  Orientation:  Full (Time, Place, and Person)  Thought Content:  Rumination as noted, ruminative about marital stress. Denies hallucinations and no delusions. Not internally preoccupied   Suicidal Thoughts:  Yes.  without intent/plan describes thoughts of feeling she would like to die, but  denies any plan or intention of hurting self on unit, and is able to contract for safety at  This time  Homicidal Thoughts:  No at this time denies thoughts of hurting husband or anyone else  Memory:  recent and remote grossly intact  Judgement:  Fair  Insight:  improving   Psychomotor Activity:  Decreased  Concentration:  Fair  Recall:  [place] of  Knowledge:Good  Language: Good  Akathisia:  Negative  Handed:  Right  AIMS (if indicated):     Assets:  Resilience  ADL's:  fair  Cognition: WNL  Sleep:  Number of Hours: 6.75     Current Medications: Current Facility-Administered Medications  Medication Dose Route Frequency Provider Last Rate Last Dose  . acetaminophen (TYLENOL) tablet 650 mg  650 mg Oral Q6H PRN Kerrie Buffalo, NP   650 mg at 01/15/15 1125  . albuterol (PROVENTIL HFA;VENTOLIN HFA) 108 (90 BASE) MCG/ACT inhaler 2 puff  2 puff Inhalation TID PRN Kerrie Buffalo, NP      . alum & mag hydroxide-simeth (MAALOX/MYLANTA) 200-200-20 MG/5ML suspension 30 mL  30 mL Oral Q4H PRN Kerrie Buffalo, NP      . beclomethasone (QVAR) 80 MCG/ACT inhaler 2 puff  2 puff Inhalation BID Kerrie Buffalo, NP   2 puff at 01/15/15 0820  . clonazePAM (KLONOPIN) tablet 1 mg  1 mg Oral BID Jenne Campus, MD   1 mg at 01/15/15 0819  . diphenhydrAMINE (BENADRYL) capsule 25 mg  25 mg Oral Q6H PRN Kerrie Buffalo, NP   25 mg at 01/15/15 1125  . divalproex (DEPAKOTE) DR tablet 500 mg  500 mg Oral BID Kerrie Buffalo, NP   500 mg at 01/15/15 0816   . [START ON 01/16/2015] DULoxetine (CYMBALTA) DR capsule 60 mg  60 mg Oral Daily Myer Peer Cobos, MD      . gabapentin (NEURONTIN) capsule 900 mg  900 mg Oral TID Kerrie Buffalo, NP   900 mg at 01/15/15 1125  . hydrOXYzine (ATARAX/VISTARIL) tablet 25 mg  25 mg Oral TID PRN Kerrie Buffalo, NP   25 mg at 01/14/15 2141  . ibuprofen (ADVIL,MOTRIN) tablet 600 mg  600 mg Oral Q6H PRN Niel Hummer, NP   600 mg at 01/11/15 6283  . magnesium hydroxide (MILK OF MAGNESIA) suspension 30 mL  30 mL Oral Daily PRN Kerrie Buffalo, NP      . methocarbamol (ROBAXIN) tablet 1,000 mg  1,000 mg Oral Q8H PRN Kerrie Buffalo, NP   1,000 mg at 01/15/15 0818  . pantoprazole (PROTONIX) EC tablet 40 mg  40 mg Oral Daily Jenne Campus, MD   40 mg at 01/15/15 0816  . traZODone (DESYREL) tablet 50 mg  50 mg Oral QHS PRN Ursula Alert, MD   50 mg at 01/14/15 2141  . ziprasidone (GEODON) capsule 80 mg  80 mg Oral BID WC Jenne Campus, MD   80 mg at 01/15/15 1517    Lab Results:  Results for orders placed or performed during the hospital encounter of 01/09/15 (from the past 48 hour(s))  Glucose, capillary     Status: None   Collection Time: 01/13/15  5:20 PM  Result Value Ref Range   Glucose-Capillary 81 65 - 99 mg/dL  Glucose, capillary     Status: Abnormal   Collection Time: 01/13/15  7:44 PM  Result Value Ref Range   Glucose-Capillary 122 (H) 65 - 99 mg/dL  Glucose, capillary     Status: Abnormal   Collection Time: 01/14/15  6:02 AM  Result Value Ref Range   Glucose-Capillary 100 (H) 65 - 99 mg/dL  Glucose, capillary     Status: Abnormal   Collection Time: 01/14/15 11:54 AM  Result Value Ref Range   Glucose-Capillary 107 (H) 65 - 99 mg/dL   Comment 1 Notify RN    Comment 2 Document in Chart   Glucose, capillary  Status: None   Collection Time: 01/14/15  4:48 PM  Result Value Ref Range   Glucose-Capillary 97 65 - 99 mg/dL  Glucose, capillary     Status: Abnormal   Collection Time: 01/14/15  8:27 PM   Result Value Ref Range   Glucose-Capillary 105 (H) 65 - 99 mg/dL  Glucose, capillary     Status: None   Collection Time: 01/15/15  6:12 AM  Result Value Ref Range   Glucose-Capillary 87 65 - 99 mg/dL    Physical Findings: AIMS: Facial and Oral Movements Muscles of Facial Expression: None, normal Lips and Perioral Area: None, normal Jaw: None, normal Tongue: None, normal,Extremity Movements Upper (arms, wrists, hands, fingers): None, normal Lower (legs, knees, ankles, toes): None, normal, Trunk Movements Neck, shoulders, hips: None, normal, Overall Severity Severity of abnormal movements (highest score from questions above): None, normal Incapacitation due to abnormal movements: None, normal Patient's awareness of abnormal movements (rate only patient's report): No Awareness, Dental Status Current problems with teeth and/or dentures?: No Does patient usually wear dentures?: No  CIWA:  CIWA-Ar Total: 4 COWS:     Assessment:  Patient remains depressed, sad, and constricted in affect. No current psychotic symptoms noted. She is ruminative about marital separation, and admits she feels overwhelmed as she feels she cannot function independently without her husband's support.  She reports  A history of B12 deficiency . She is having passive SI, but is not actively suicidal and is able to contract for safety on the unit. She denies psychotic symptoms. So far, she is tolerating Cymbalta/Depakote/Geodon  well.   Treatment Plan Summary: Daily contact with patient to assess and evaluate symptoms and progress in treatment, Medication management, Plan Ongoing patient admission and continue medications as below  Continue Depakote ER 500 mgs BID for management of mood disorder. Continue  Geodon 80 mgs BID for management of mood disorder. Continue Klonopin 1 mg BID for management of anxiety. Continue Neurontin 900 mgs TID for management of chronic pain. Continue Trazodone 50 mgs QHS PRN for  management for insomnia as needed. Increase Cymbalta to 60 mgs QDAY for management of depression. On PPI for GERD management. Will request B12, Folate and Vitamin D levels, based on reported history of Vitamin Deficiency.   Medical Decision Making:  Established Problem, Stable/Improving (1), Review of Psycho-Social Stressors (1), Review or order clinical lab tests (1) and Review of Medication Regimen & Side Effects (2)     COBOS, FERNANDO 01/15/2015, 4:16 PM

## 2015-01-15 NOTE — [facility] Group Notes (Signed)
[facility] Group Notes:  (Nursing/MHT/Case Management/Adjunct)  Date:  01/15/2015  Time:  0900 am  Type of Therapy:  Psychoeducational Skills  Participation Level:  Minimal  Participation Quality:  Resistant  Affect:  Resistant  Cognitive:  Alert  Insight:  Improving  Engagement in Group:  Resistant  Modes of Intervention:  Support  Summary of Progress/Problems:  [place] Mon 01/15/2015, 9:31 AM

## 2015-01-15 NOTE — Progress Notes (Signed)
Patient ID: Stacey [name], female   DOB: May 25, 1966, 49 y.o.   MRN: [id number] D: "I feel tired but went to group today". Patient mood and affect appeared depressed and flat. Pt rated depression as 6 and anxiety as 7 on a 0-10 scale. Pt report she is not sure about goals after discharge. Pt denies SI/HI/AVH. Pt attended evening wrap up group and engage in discussion. Cooperative with assessment.  A: Met with pt 1:1. Medications administered as prescribed. Support and encouragement provider to engage in milieu. Pt encouraged to discuss feelings and come to staff with any question or concerns.  R: Patient remains safe. She is complaint with medications.

## 2015-01-15 NOTE — Plan of Care (Signed)
Problem: Diagnosis: Increased Risk For Suicide Attempt Goal: STG-Patient Will Attend All Groups On The Unit Outcome: Progressing Pt reports she felt tired during the day but went group. Pt attended evening wrap up group and engage in discussion.

## 2015-01-15 NOTE — [facility] Group Notes (Signed)
[facility] Group Notes:  (Clinical Social Work)  01/15/2015     1:15-2:15PM  Summary of Progress/Problems:   The main focus of today's process group was to discuss how the purpose of both healthy and unhealthy coping skills is to meet needs that all people have in life.  Each group member talked about one healthy way and one unhealthy way they try to get their needs met.  While talking about the unhealthy method, the group gave suggestions for replacement healthier techniques.  Motivational Interviewing and a whiteboard were utilized to explore in depth the perceived benefits and costs of unhealthy coping techniques, and a Decisional Balance Exercise worksheet was provided for patients to work on at a later time.  The patient stated she has been using the healthy technique of squeezing or holding a stress ball when anxiety hurts her.  She was out of the room for a good portion of group to talk with doctor.  She was able to react when called on by clinician, by providing ideas on interacting with a child to another patient.  Her affect was less flat while talking.  Type of Therapy:  Group Therapy - Process   Participation Level:  Minimal  Participation Quality:  Attentive and Supportive  Affect:  Anxious and Flat  Cognitive:  Alert  Insight:  Developing/Improving  Engagement in Therapy:  Developing/Improving  Modes of Intervention:  Education, Motivational Interviewing  Selmer Dominion, LCSW 01/15/2015, 4:19 PM

## 2015-01-16 LAB — FOLATE: Folate: 8.4 ng/mL (ref >5.9)

## 2015-01-16 LAB — GLUCOSE, CAPILLARY: Glucose-Capillary: 104 mg/dL — ABNORMAL HIGH (ref 65–99)

## 2015-01-16 LAB — VITAMIN B12: Vitamin B-12: 262 pg/mL (ref 180–914)

## 2015-01-16 NOTE — Progress Notes (Signed)
D.  Pt very flat on approach, minimal interaction.  Pt in dayroom with peers but limited interaction.  Denies SI/HI/hallucinations at this time.  Denies needs other than request for Robaxin and Vistaril and HS.  A.  Support and encouragement offered, medications given as ordered  R.  Pt remains safe on unit, will continue to monitor.

## 2015-01-16 NOTE — Progress Notes (Signed)
D) Pt continue to have some psychomotor retardation in her movements getting out of bed or taking medications. Pt is slow to respond as well. States that she is feeling depressed ever since her husband left her there other day. "he told me he was leaving and I just got very depressed. Voice is monotone in her response and there is little expression in her features when she talks on a 1:1. States she does not feel well and that her back hurts a lot, and has been given pain and muscle relaxer's to help her tolerate the discomfort. Rates her depression at a 5, her hopelessness at a 7 and her anxiety at a 7. Denies SI and HI Has stayed in the bed and has had little interaction in the dayroom except for groups. A) Given support and reassurance. Offered assistance with bathing and making her bed. Given support and therapeutic engagement provided R) Pt denies SI and HI. Remains depressed.

## 2015-01-16 NOTE — [facility] Group Notes (Signed)
[facility] Group Notes:  (Clinical Social Work)  01/16/2015  1:15-2:15PM  Summary of Progress/Problems: The main focus of today's process group was to  1) Identify current healthy supports  2) Talk about why we sometimes don't use our supports 3) Discuss the importance of adding supports and   4) Identify needs that we are trying to get met, plus what possible supports could help meet those needs  The five love languages were also discussed, each patient's specific love language was shared, and much validation was provided.  The patient expressed full comprehension of the concepts presented, and agreed that there is a need to add more supports in order to get needs more fully met. The patient stated her older sons are going to be good supports for her.  She did not participate heavily in the discussion, was out of the group meeting with doctor for awhile, but was more relaxed and smiling today.  Type of Therapy:  Process Group with Motivational Interviewing  Participation Level:  Active  Participation Quality:  Attentive  Affect:  Blunted  Cognitive:  Alert  Insight:  Developing/Improving  Engagement in Therapy:  Engaged  Modes of Intervention:   Education, Support and Processing, Activity  Selmer Dominion, LCSW 01/16/2015

## 2015-01-16 NOTE — Progress Notes (Signed)
Psychoeducational Group Note  Date: 01/16/2015 Time:0930  Group Topic/Focus:  Gratefulness:  The focus of this group is to help patients identify what two things they are most grateful for in their lives. What helps ground them and to center them on their work to their recovery.  Participation Level:  Active  Participation Quality:  Appropriate  Affect:  Appropriate  Cognitive:  Alert  Insight:  Improving  Engagement in Group:  Improving  Additional Comments:  Pt sat and at times would contribute to the discussion.  Damita Eppard A   

## 2015-01-16 NOTE — Progress Notes (Addendum)
Patient ID: Stacey [name], female   DOB: [birth year], 49 y.o.   MRN: [id number] [facility] MD Progress Note  01/16/2015 2:24 PM Stacey [name]  MRN:  [id number] Subjective:  Patient continues  To report feeling depressed, with low energy level. Thus far she states she is tolerating medications well and currently denies side effects, except for some mild sedation she attributes to Geodon titration  Objective: I have discussed case with treatment team and have met with patient.  Patient continues to present depressed, sad, with constricted affect and some psychomotor retardation/ slowing. No psychotic symptoms. She has been more active in milieu, and has been attending groups regularly, although participation limited . Denies any current plan or intention of hurting self, and has not presented with any self injurious behaviors on unit.  Fairly responsive to support/ encouragement. Of note, B12 and Folate serum levels within normal limits .   Principal Problem: Bipolar I disorder, most recent episode (or current) unspecified Diagnosis:   Patient Active Problem List   Diagnosis Date Noted  . Bipolar I disorder, most recent episode (or current) unspecified [F31.9] 01/09/2015  . Panic attacks [F41.0] 01/09/2015  . Headache(784.0) [R51] 07/10/2013  . Depressive disorder [F32.9] 03/08/2013  . Aphthous ulcer [K12.0] 03/01/2013  . Cellulitis [L03.90] 03/01/2013  . Chest pain [R07.9] 02/27/2013  . Iron deficiency anemia, unspecified [D50.9] 02/24/2013  . Pernicious anemia [D51.0] 02/24/2013  . Abnormality of gait [R26.9] 12/09/2012  . Dysphagia, unspecified(787.20) [R13.10] 12/08/2012  . DUB (dysfunctional uterine bleeding) [N93.8] 05/30/2011  . Anemia [D64.9] 05/30/2011  . COUGH [R05] 03/29/2010  . SKIN RASH [R21] 09/30/2009  . ACUTE BRONCHITIS [J20.9] 08/10/2009  . ALCOHOLISM [F10.20] 08/05/2009  . DIARRHEA [R19.7] 08/05/2009  . SINUSITIS, ACUTE [J01.90] 07/06/2009  . CELIAC SPRUE  [K90.0] 07/06/2009  . 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 THYROID NODULE [E04.1] 03/04/2009  . Allergic rhinitis, cause unspecified [J30.9] 01/27/2009  . OBSTRUCTIVE SLEEP APNEA [G47.30] 01/26/2009  . SHORTNESS OF BREATH (SOB) [R06.02] 12/22/2008  . FIBROMYALGIA [M79.1, M60.9] 12/13/2008   Total Time spent with patient: 20 minutes    Past Medical History:  Past Medical History  Diagnosis Date  . Asthma   . Allergic rhinitis, cause unspecified   . Bipolar 1 disorder   . Morbid obesity   . Thyroid nodule   . Mental disorder   . 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 Pt has never been tested.   . Anxiety   . OSA (obstructive sleep apnea)     uses cpap does not know settings  . Complication of anesthesia 2004    bp dropped after c section, took 24hrs to feels leg , numbing medicine does not last long when injected into skin  . Difficult intravenous access     hard stick for ivs and lab per pt  . Nasal bone fracture age 53    left side cannot place tube in nostril per pt  . Iron deficiency anemia, unspecified 02/24/2013  . Pernicious anemia 02/24/2013  . Anxiety   . Fibromyalgia     Past Surgical History  Procedure Laterality Date  . Tubal ligation    . Cesarean section    . Noraplant    . Wisdom tooth extraction    . Esophagogastroduodenoscopy N/A 01/02/2013    Procedure: ESOPHAGOGASTRODUODENOSCOPY (EGD);  Surgeon: Inda Castle, MD;  Location: [facility];  Service: Endoscopy;  Laterality: N/A;  . Colonoscopy with propofol N/A 01/02/2013    Procedure: COLONOSCOPY WITH  PROPOFOL;  Surgeon: Inda Castle, MD;  Location: [facility];  Service: Endoscopy;  Laterality: N/A;   Family History:  Family History  Problem Relation Age of Onset  . Breast cancer Mother   . Asthma Mother   . Emphysema Paternal Grandmother   . Diabetes Father    Social History:  History  Alcohol Use  . Yes    Comment: socially     History  Drug Use No    History   Social  History  . Marital Status: Married    Spouse Name: [place]  . Number of Children: 4  . Years of Education: Bachelors   Occupational History  . Unemployed    Social History Main Topics  . Smoking status: Former Smoker -- 0.50 packs/day for 20 years    Types: Cigarettes    Start date: 02/05/1987    Quit date: 07/30/2006  . Smokeless tobacco: Never Used     Comment: quit 7 years ago  . Alcohol Use: Yes     Comment: socially  . Drug Use: No  . Sexual Activity: Yes    Birth Control/ Protection: Surgical   Other Topics Concern  . None   Social History Narrative   [facility] 4 children 21, 15, 42, 10, she stays at home.   Patient has a Bachelor's degree.   Patient is right- handed.   Patient drinks one cup of coffee in the morning, sometimes 2 cups.         Additional History:    Sleep: Improved  Appetite:  Improved   Assessment:   Musculoskeletal: Strength & Muscle Tone: within normal limits Gait & Station: normal Patient leans: N/A   Psychiatric Specialty Exam: Physical Exam  ROS denies nausea or vomiting. Denies excessive sedation.   Blood pressure 117/66, pulse 92, temperature 98.3 F (36.8 C), temperature source Oral, resp. rate 18, height 5' 8.5" (1.74 m), weight 326 lb (147.873 kg), last menstrual period 12/09/2014, SpO2 100 %.Body mass index is 48.84 kg/(m^2).  General Appearance: fairly groomed  Engineer, water::  Improved  Speech:  Normal Rate  Volume:  Decreased  Mood:  Remains depressed   Affect:  Constricted   Thought Process:  Linear  Orientation:  Full (Time, Place, and Person)  Thought Content:  Denies hallucinations and no delusions. Not internally preoccupied   Suicidal Thoughts:  No  Continues to deny any current plan or intention of hurting herself and contracts for safety on the unit   Homicidal Thoughts:  No at this time denies thoughts of hurting husband or anyone else  Memory:  recent and remote grossly intact   Judgement:  Fair  Insight:  improving   Psychomotor Activity:  Decreased  Concentration:  Fair  Recall:  Good  Fund of Knowledge:Good  Language: Good  Akathisia:  Negative  Handed:  Right  AIMS (if indicated):     Assets:  Resilience  ADL's:  fair  Cognition: WNL  Sleep:  Number of Hours: 6.75     Current Medications: Current Facility-Administered Medications  Medication Dose Route Frequency Provider Last Rate Last Dose  . acetaminophen (TYLENOL) tablet 650 mg  650 mg Oral Q6H PRN Kerrie Buffalo, NP   650 mg at 01/15/15 1125  . albuterol (PROVENTIL HFA;VENTOLIN HFA) 108 (90 BASE) MCG/ACT inhaler 2 puff  2 puff Inhalation TID PRN Kerrie Buffalo, NP      . alum & mag hydroxide-simeth (MAALOX/MYLANTA) 200-200-20 MG/5ML suspension 30 mL  30 mL  Oral Q4H PRN Kerrie Buffalo, NP      . beclomethasone (QVAR) 80 MCG/ACT inhaler 2 puff  2 puff Inhalation BID Kerrie Buffalo, NP   2 puff at 01/16/15 0847  . clonazePAM (KLONOPIN) tablet 1 mg  1 mg Oral BID Jenne Campus, MD   1 mg at 01/16/15 0848  . diphenhydrAMINE (BENADRYL) capsule 25 mg  25 mg Oral Q6H PRN Kerrie Buffalo, NP   25 mg at 01/15/15 1125  . divalproex (DEPAKOTE) DR tablet 500 mg  500 mg Oral BID Kerrie Buffalo, NP   500 mg at 01/16/15 0848  . DULoxetine (CYMBALTA) DR capsule 60 mg  60 mg Oral Daily Myer Peer Narcisa Ganesh, MD   60 mg at 01/16/15 0800  . gabapentin (NEURONTIN) capsule 900 mg  900 mg Oral TID Kerrie Buffalo, NP   900 mg at 01/16/15 1304  . hydrOXYzine (ATARAX/VISTARIL) tablet 25 mg  25 mg Oral TID PRN Kerrie Buffalo, NP   25 mg at 01/15/15 2107  . ibuprofen (ADVIL,MOTRIN) tablet 600 mg  600 mg Oral Q6H PRN Niel Hummer, NP   600 mg at 01/16/15 1306  . magnesium hydroxide (MILK OF MAGNESIA) suspension 30 mL  30 mL Oral Daily PRN Kerrie Buffalo, NP      . methocarbamol (ROBAXIN) tablet 1,000 mg  1,000 mg Oral Q8H PRN Kerrie Buffalo, NP   1,000 mg at 01/16/15 0850  . pantoprazole (PROTONIX) EC tablet 40 mg  40 mg Oral  Daily Jenne Campus, MD   40 mg at 01/16/15 0848  . traZODone (DESYREL) tablet 50 mg  50 mg Oral QHS PRN Ursula Alert, MD   50 mg at 01/15/15 2107  . ziprasidone (GEODON) capsule 80 mg  80 mg Oral BID WC Jenne Campus, MD   80 mg at 01/16/15 0848    Lab Results:  Results for orders placed or performed during the hospital encounter of 01/09/15 (from the past 48 hour(s))  Glucose, capillary     Status: None   Collection Time: 01/14/15  4:48 PM  Result Value Ref Range   Glucose-Capillary 97 65 - 99 mg/dL  Glucose, capillary     Status: Abnormal   Collection Time: 01/14/15  8:27 PM  Result Value Ref Range   Glucose-Capillary 105 (H) 65 - 99 mg/dL  Glucose, capillary     Status: None   Collection Time: 01/15/15  6:12 AM  Result Value Ref Range   Glucose-Capillary 87 65 - 99 mg/dL  Glucose, capillary     Status: Abnormal   Collection Time: 01/15/15  5:19 PM  Result Value Ref Range   Glucose-Capillary 123 (H) 65 - 99 mg/dL  Vitamin B12     Status: None   Collection Time: 01/16/15  6:27 AM  Result Value Ref Range   Vitamin B-12 262 180 - 914 pg/mL    Comment: (NOTE) This assay is not validated for testing neonatal or myeloproliferative syndrome specimens for Vitamin B12 levels. Performed at [facility]   Folate     Status: None   Collection Time: 01/16/15  6:27 AM  Result Value Ref Range   Folate 8.4 >5.9 ng/mL    Comment: Performed at [facility]  Glucose, capillary     Status: Abnormal   Collection Time: 01/16/15  6:42 AM  Result Value Ref Range   Glucose-Capillary 104 (H) 65 - 99 mg/dL   Comment 1 Notify RN     Physical Findings: AIMS: Facial and Oral  Movements Muscles of Facial Expression: None, normal Lips and Perioral Area: None, normal Jaw: None, normal Tongue: None, normal,Extremity Movements Upper (arms, wrists, hands, fingers): None, normal Lower (legs, knees, ankles, toes): None, normal, Trunk Movements Neck, shoulders, hips: None,  normal, Overall Severity Severity of abnormal movements (highest score from questions above): None, normal Incapacitation due to abnormal movements: None, normal Patient's awareness of abnormal movements (rate only patient's report): No Awareness, Dental Status Current problems with teeth and/or dentures?: No Does patient usually wear dentures?: No  CIWA:  CIWA-Ar Total: 4 COWS:     Assessment:  Patient continues to present with depression, constricted affect, and psychomotor retardation symptoms. She denies plan or intention of hurting self and no self injurious behaviors have been noted on Unit/ has contracted for safety on unit . She is more visible in milieu, and is going to groups, but there is an ongoing  element of psychomotor retardation,  With a  soft/ slightly slowed  speech. She is tolerating medications well and at this time does not endorse psychiatric medication side effects, except for mild sedation as Geodon dosage increased . So far tolerating Cymbalta trial well.   Reports chronic pain/fibromyalgia symptoms currently somewhat  improved on Cymbalta / Neurontin . Vitamin levels had been ordered due to history of vitamin deficiency-  Folic Acid and Y85 WNL.  Treatment Plan Summary: Daily contact with patient to assess and evaluate symptoms and progress in treatment, Medication management, Plan Ongoing patient admission and continue medications as below  Continue Depakote ER 500 mgs BID for management of mood disorder. Continue  Geodon 80 mgs BID for management of mood disorder. Continue Klonopin 1 mg BID for management of anxiety. Continue Neurontin 900 mgs TID for management of chronic pain. Continue Trazodone 50 mgs QHS PRN for management for insomnia as needed. Continue  Cymbalta 60 mgs QDAY for management of depression. On Protonix for GERD management.   Medical Decision Making:  Established Problem, Stable/Improving (1), Review of Psycho-Social Stressors (1), Review or  order clinical lab tests (1) and Review of Medication Regimen & Side Effects (2)     Camrynn Mcclintic 01/16/2015, 2:24 PM

## 2015-01-16 NOTE — Progress Notes (Signed)
Psychoeducational Group Note  Date:  01/16/2015 Time:  1015  Group Topic/Focus:  Making Healthy Choices:   The focus of this group is to help patients identify negative/unhealthy choices they were using prior to admission and identify positive/healthier coping strategies to replace them upon discharge.  Participation Level:  Active  Participation Quality:  Appropriate  Affect:  Appropriate  Cognitive:  Oriented  Insight:  Engaged and Improving  Engagement in Group:  Engaged  Additional Comments:  Pt attended and partisipated in the group  Ladell Bey A 01/16/2015  

## 2015-01-17 LAB — GLUCOSE, CAPILLARY
GLUCOSE-CAPILLARY: 108 mg/dL — AB (ref 65–99)
Glucose-Capillary: 88 mg/dL (ref 65–99)

## 2015-01-17 NOTE — Clinical Social Work Note (Signed)
Referral faxed to [facility] at [facility] Assessment services to be reviewed for ECT treatment.  [name], MSW, [employer] Clinical Social Worker [facility] [phone]

## 2015-01-17 NOTE — Progress Notes (Signed)
Patient ID: Stacey [name], female   DOB: Sep 06, 1965, 49 y.o.   MRN: [id number] [facility] MD Progress Note  01/17/2015 4:08 PM Stacey [name]  MRN:  [id number] Subjective:   Although patient reports feeling " a little bit better", she continues to present quite flat and blunted in affect and with ongoing psychomotor retardation. She  Denies medication side effects, except for minor sedation related to increase in Geodon dose. She does acknowledge ongoing severe depression.    Objective: I have discussed case with treatment team and have met with patient.  As noted , staff reports that patient continues to present with signficant sadness, blunted affect, and has some degree of psychomotor slowing/retardation.She is visible in day room,  But tends to keep to self and her interactions with peers are limited, and her participation in groups tends to be poor. She states she continues to have " on and off " thoughts of dying", but denies any actual suicidal ideations, and denies any plan or intention of hurting herself on the unit. She also identifies her children as a protective factor from hurting herself . She seems less ruminative about her marital separation compared to last week. She denies medication side effects. She is not presenting with akathisia, no abnormal involuntary movements noted . Due to persistence of severe depression, we discussed other treatment options and patient expressed she would agree to ECT treatment if offered. CSW has generated  Referral to [place], Dr. Evans Lance, to review case to consider ECT .     Principal Problem: Bipolar I disorder, most recent episode (or current) unspecified Diagnosis:   Patient Active Problem List   Diagnosis Date Noted  . Bipolar I disorder, most recent episode (or current) unspecified [F31.9] 01/09/2015  . Panic attacks [F41.0] 01/09/2015  . Headache(784.0) [R51] 07/10/2013  . Depressive disorder [F32.9] 03/08/2013  . Aphthous  ulcer [K12.0] 03/01/2013  . Cellulitis [L03.90] 03/01/2013  . Chest pain [R07.9] 02/27/2013  . Iron deficiency anemia, unspecified [D50.9] 02/24/2013  . Pernicious anemia [D51.0] 02/24/2013  . Abnormality of gait [R26.9] 12/09/2012  . Dysphagia, unspecified(787.20) [R13.10] 12/08/2012  . DUB (dysfunctional uterine bleeding) [N93.8] 05/30/2011  . Anemia [D64.9] 05/30/2011  . COUGH [R05] 03/29/2010  . SKIN RASH [R21] 09/30/2009  . ACUTE BRONCHITIS [J20.9] 08/10/2009  . ALCOHOLISM [F10.20] 08/05/2009  . DIARRHEA [R19.7] 08/05/2009  . SINUSITIS, ACUTE [J01.90] 07/06/2009  . CELIAC SPRUE [K90.0] 07/06/2009  . 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 THYROID NODULE [E04.1] 03/04/2009  . Allergic rhinitis, cause unspecified [J30.9] 01/27/2009  . OBSTRUCTIVE SLEEP APNEA [G47.30] 01/26/2009  . SHORTNESS OF BREATH (SOB) [R06.02] 12/22/2008  . FIBROMYALGIA [M79.1, M60.9] 12/13/2008   Total Time spent with patient: 20 minutes    Past Medical History:  Past Medical History  Diagnosis Date  . Asthma   . Allergic rhinitis, cause unspecified   . Bipolar 1 disorder   . Morbid obesity   . Thyroid nodule   . Mental disorder   . 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 Pt has never been tested.   . Anxiety   . OSA (obstructive sleep apnea)     uses cpap does not know settings  . Complication of anesthesia 2004    bp dropped after c section, took 24hrs to feels leg , numbing medicine does not last long when injected into skin  . Difficult intravenous access     hard stick for ivs and lab per pt  . Nasal bone fracture age  8    left side cannot place tube in nostril per pt  . Iron deficiency anemia, unspecified 02/24/2013  . Pernicious anemia 02/24/2013  . Anxiety   . Fibromyalgia     Past Surgical History  Procedure Laterality Date  . Tubal ligation    . Cesarean section    . Noraplant    . Wisdom tooth extraction    . Esophagogastroduodenoscopy N/A 01/02/2013    Procedure:  ESOPHAGOGASTRODUODENOSCOPY (EGD);  Surgeon: Inda Castle, MD;  Location: [facility];  Service: Endoscopy;  Laterality: N/A;  . Colonoscopy with propofol N/A 01/02/2013    Procedure: COLONOSCOPY WITH PROPOFOL;  Surgeon: Inda Castle, MD;  Location: [facility];  Service: Endoscopy;  Laterality: N/A;   Family History:  Family History  Problem Relation Age of Onset  . Breast cancer Mother   . Asthma Mother   . Emphysema Paternal Grandmother   . Diabetes Father    Social History:  History  Alcohol Use  . Yes    Comment: socially     History  Drug Use No    History   Social History  . Marital Status: Married    Spouse Name: [place]  . Number of Children: 4  . Years of Education: Bachelors   Occupational History  . Unemployed    Social History Main Topics  . Smoking status: Former Smoker -- 0.50 packs/day for 20 years    Types: Cigarettes    Start date: 02/05/1987    Quit date: 07/30/2006  . Smokeless tobacco: Never Used     Comment: quit 7 years ago  . Alcohol Use: Yes     Comment: socially  . Drug Use: No  . Sexual Activity: Yes    Birth Control/ Protection: Surgical   Other Topics Concern  . None   Social History Narrative   [facility] 4 children 21, 15, 105, 10, she stays at home.   Patient has a Bachelor's degree.   Patient is right- handed.   Patient drinks one cup of coffee in the morning, sometimes 2 cups.         Additional History:    Sleep: Improved  Appetite:  Improved   Assessment:   Musculoskeletal: Strength & Muscle Tone: within normal limits Gait & Station: normal Patient leans: N/A   Psychiatric Specialty Exam: Physical Exam  ROS denies nausea or vomiting. Denies  Chest pain or shortness of breath   Blood pressure 108/62, pulse 95, temperature 98.3 F (36.8 C), temperature source Oral, resp. rate 18, height 5' 8.5" (1.74 m), weight 326 lb (147.873 kg), last menstrual period 12/09/2014, SpO2 100 %.Body  mass index is 48.84 kg/(m^2).  General Appearance: fairly groomed  Engineer, water::  Improved  Speech:  Normal Rate  Volume:  Decreased  Mood:  Depressed, states she is feeling " a little better"   Affect:  Constricted   Thought Process:  Linear  Orientation:  Full (Time, Place, and Person)  Thought Content:  Denies hallucinations and no delusions. Not internally preoccupied   Suicidal Thoughts:  No  Continues to deny any current plan or intention of hurting herself and contracts for safety on the unit   Homicidal Thoughts:  No at this time denies thoughts of hurting husband or anyone else  Memory:  recent and remote grossly intact  Judgement:  Fair  Insight:  improving   Psychomotor Activity:  Decreased- some degree of psychomotor retardation noted, in latency of answering questions, posture,  and speech  Concentration:  Fair  Recall:  Good  Fund of Knowledge:Good  Language: Good  Akathisia:  Negative  Handed:  Right  AIMS (if indicated):     Assets:  Resilience  ADL's:  fair  Cognition: WNL  Sleep:  Number of Hours: 6.75     Current Medications: Current Facility-Administered Medications  Medication Dose Route Frequency Provider Last Rate Last Dose  . acetaminophen (TYLENOL) tablet 650 mg  650 mg Oral Q6H PRN Adonis Brook, NP   650 mg at 01/15/15 1125  . albuterol (PROVENTIL HFA;VENTOLIN HFA) 108 (90 BASE) MCG/ACT inhaler 2 puff  2 puff Inhalation TID PRN Adonis Brook, NP      . alum & mag hydroxide-simeth (MAALOX/MYLANTA) 200-200-20 MG/5ML suspension 30 mL  30 mL Oral Q4H PRN Adonis Brook, NP      . beclomethasone (QVAR) 80 MCG/ACT inhaler 2 puff  2 puff Inhalation BID Adonis Brook, NP   2 puff at 01/17/15 0818  . clonazePAM (KLONOPIN) tablet 1 mg  1 mg Oral BID Craige Cotta, MD   1 mg at 01/17/15 0819  . diphenhydrAMINE (BENADRYL) capsule 25 mg  25 mg Oral Q6H PRN Adonis Brook, NP   25 mg at 01/16/15 2123  . divalproex (DEPAKOTE) DR tablet 500 mg  500 mg Oral BID  Adonis Brook, NP   500 mg at 01/17/15 0819  . DULoxetine (CYMBALTA) DR capsule 60 mg  60 mg Oral Daily Craige Cotta, MD   60 mg at 01/17/15 0819  . gabapentin (NEURONTIN) capsule 900 mg  900 mg Oral TID Adonis Brook, NP   900 mg at 01/17/15 1250  . hydrOXYzine (ATARAX/VISTARIL) tablet 25 mg  25 mg Oral TID PRN Adonis Brook, NP   25 mg at 01/16/15 2123  . ibuprofen (ADVIL,MOTRIN) tablet 600 mg  600 mg Oral Q6H PRN Thermon Leyland, NP   600 mg at 01/17/15 0500  . magnesium hydroxide (MILK OF MAGNESIA) suspension 30 mL  30 mL Oral Daily PRN Adonis Brook, NP      . methocarbamol (ROBAXIN) tablet 1,000 mg  1,000 mg Oral Q8H PRN Adonis Brook, NP   1,000 mg at 01/17/15 0500  . pantoprazole (PROTONIX) EC tablet 40 mg  40 mg Oral Daily Craige Cotta, MD   40 mg at 01/17/15 0819  . traZODone (DESYREL) tablet 50 mg  50 mg Oral QHS PRN Jomarie Longs, MD   50 mg at 01/16/15 2123  . ziprasidone (GEODON) capsule 80 mg  80 mg Oral BID WC Craige Cotta, MD   80 mg at 01/17/15 9109    Lab Results:  Results for orders placed or performed during the hospital encounter of 01/09/15 (from the past 48 hour(s))  Glucose, capillary     Status: Abnormal   Collection Time: 01/15/15  5:19 PM  Result Value Ref Range   Glucose-Capillary 123 (H) 65 - 99 mg/dL  Vitamin V26     Status: None   Collection Time: 01/16/15  6:27 AM  Result Value Ref Range   Vitamin B-12 262 180 - 914 pg/mL    Comment: (NOTE) This assay is not validated for testing neonatal or myeloproliferative syndrome specimens for Vitamin B12 levels. Performed at [facility]   Folate     Status: None   Collection Time: 01/16/15  6:27 AM  Result Value Ref Range   Folate 8.4 >5.9 ng/mL    Comment: Performed at [facility]  Glucose, capillary  Status: Abnormal   Collection Time: 01/16/15  6:42 AM  Result Value Ref Range   Glucose-Capillary 104 (H) 65 - 99 mg/dL   Comment 1 Notify RN   Glucose, capillary      Status: Abnormal   Collection Time: 01/17/15  6:25 AM  Result Value Ref Range   Glucose-Capillary 108 (H) 65 - 99 mg/dL    Physical Findings: AIMS: Facial and Oral Movements Muscles of Facial Expression: None, normal Lips and Perioral Area: None, normal Jaw: None, normal Tongue: None, normal,Extremity Movements Upper (arms, wrists, hands, fingers): None, normal Lower (legs, knees, ankles, toes): None, normal, Trunk Movements Neck, shoulders, hips: None, normal, Overall Severity Severity of abnormal movements (highest score from questions above): None, normal Incapacitation due to abnormal movements: None, normal Patient's awareness of abnormal movements (rate only patient's report): No Awareness, Dental Status Current problems with teeth and/or dentures?: No Does patient usually wear dentures?: No  CIWA:  CIWA-Ar Total: 4 COWS:     Assessment:  Patient continues to present with significant and severe depression, and staff notices ongoing severe symptoms of depression- flat affect, minimal interaction with others, anhedonia. Patient reports some subjective feeling of  Partial improvement but this is not readily noticed by staff. There are no psychotic symptoms. She is less ruminative about her marital discord /separation and states she has not spoken with her husband since last week. Diabetes Mellitus currently well controlled . She is tolerating medications well and denies side effects. She has tolerated Cymbalta well thus far. Due to persistence and severity of Depressive symptoms we have discussed ECT as a potential treatment option and she agrees to consider it if offered. Have generated referral to Dr. Evans Lance at [facility] to assess for ECT . Treatment Plan Summary: Daily contact with patient to assess and evaluate symptoms and progress in treatment, Medication management, Plan Ongoing patient admission and continue medications as below  Continue Depakote ER 500 mgs BID for  management of mood disorder. Continue  Geodon 80 mgs BID for management of mood disorder. Continue Klonopin 1 mg BID for management of anxiety. Continue Neurontin 900 mgs TID for management of chronic pain. Continue Trazodone 50 mgs QHS PRN for management for insomnia as needed. Continue  Cymbalta 60 mgs QDAY for management of depression. On Protonix for GERD management. Consider ECT to address ongoing depression.  Vitamin D level was requested due to history of Vitamin Deficiency- result pending at this time.   Medical Decision Making:  Established Problem, Stable/Improving (1), Review of Psycho-Social Stressors (1), Review or order clinical lab tests (1) and Review of Medication Regimen & Side Effects (2)     Duaine Radin 01/17/2015, 4:08 PM

## 2015-01-17 NOTE — Progress Notes (Signed)
Recreation Therapy Notes  Date: 06.20.16 Time: 9:30 am Location: [address] Group Room  Group Topic: Stress Management  Goal Area(s) Addresses:  Patient will verbalize importance of using healthy stress management.  Patient will identify positive emotions associated with healthy stress management.   Intervention: Stress Management  Activity :  Guided Imagery.  LRT introduced and educated patients on stress management technique of guided imagery.  A script was used to deliver the technique to patients.  Patients were asked to follow script read a loud by LRT to engage in practicing the stress management technique.  Education:  Stress Management, Discharge Planning.   Clinical Observations/Feedback: Patient did not attend group.   Caroll Rancher, LRT/CTRS         Lillia Abed, Priyansh Pry A 01/17/2015 4:20 PM

## 2015-01-17 NOTE — [facility] Group Notes (Signed)
[facility] LCSW Group Therapy 01/17/2015  1:15 pm  Type of Therapy: Group Therapy Participation Level: Minimal  Participation Quality: Minimal  Affect: Lethargic  Cognitive: Alert and Oriented  Insight: Developing/Improving and Engaged  Engagement in Therapy: Developing/Improving and Engaged  Modes of Intervention: Clarification, Confrontation, Discussion, Education, Exploration,  Limit-setting, Orientation, Problem-solving, Rapport Building, Dance movement psychotherapist, Socialization and Support  Summary of Progress/Problems: Pt identified obstacles faced currently and processed barriers involved in overcoming these obstacles. Pt identified steps necessary for overcoming these obstacles and explored motivation (internal and external) for facing these difficulties head on. Pt further identified one area of concern in their lives and chose a goal to focus on for today. Patient was observed sleeping during majority of discussion and did not participate in discussion.  [name], MSW, [employer] Clinical Social Worker [facility] [phone]

## 2015-01-17 NOTE — Progress Notes (Signed)
D   Pt has a very flat depressed affect   She speaks in a monotone and has no expression on her face as she talks    She endorses depression and contracts for safety on the unit   She requested her medications early and said she was ready for bed   She reports no energy and continues to have some hopelessness A   Verbal support given   Medications administered and effectiveness monitored    Q 15 min checks   R   Pt safe at present

## 2015-01-17 NOTE — Progress Notes (Signed)
Adult Psychoeducational Group Note  Date:  01/17/2015 Time:  10:33 AM  Group Topic/Focus:  Wellness Toolbox:   The focus of this group is to discuss various aspects of wellness, balancing those aspects and exploring ways to increase the ability to experience wellness.  Patients will create a wellness toolbox for use upon discharge.  Participation Level:  Active  Participation Quality:  Appropriate  Affect:  Appropriate  Cognitive:  Appropriate  Insight: Appropriate  Engagement in Group:  Engaged  Modes of Intervention:  Discussion  Additional Comments:  Pt attended wellness group this morning. Pt participate in group and talked about what wellness means to her and what she would like to do differently. Pt shared she would like to join some type new club. Pt will do outpatient sessions as well.    Tamirra Sienkiewicz A 01/17/2015, 10:33 AM

## 2015-01-17 NOTE — Clinical Social Work Note (Signed)
CSW attempted to contact son Beckie Salts [phone] but unable to leave message at this time.  [name], MSW, [employer] Clinical Social Worker [facility] [phone]

## 2015-01-17 NOTE — Progress Notes (Signed)
During wrap-up group pt stated that she had a pretty good day and feels it results from the combination of medication that the MD has her on.  Pt sts that her Geodon has been increased and voiced that the downside was that she has been sleepy all day and hopes that this side effect will decrease with time.  Pt stated that her goal for today was to get out and spend time in the dayroom and socialize with her peers.  Sts that she accomplished her goal but was constantly dozing off.   Tomi Bamberger, Mht

## 2015-01-17 NOTE — Progress Notes (Signed)
D: Patient remains with flat, blunted affect; depressed and sad mood.  She states her depressive symptoms are improving.  She has SI "sometimes."  She denies HI/AVH.  This morning she appears lethargic and drowsy.  Patient did not have any physical complaints.  She continues to be withdrawn and somewhat isolative to room.  She is attending group and participating. A: Continue to monitor medication management and MD orders.  Safety checks completed every 15 minutes per protocol.  Meet 1:1 with patient to discuss concerns and offer encouragement. R: Patient's behavior is appropriate to situation.

## 2015-01-17 NOTE — [facility] Group Notes (Addendum)
   [facility] LCSW Aftercare Discharge Planning Group Note  01/17/2015  8:45 AM   Participation Quality: Alert, Appropriate and Oriented  Mood/Affect: Flat  Depression Rating: 3  Anxiety Rating: 5  Thoughts of Suicide: Pt denies SI/HI  Will you contract for safety? Yes  Current AVH: Pt denies  Plan for Discharge/Comments: Pt attended discharge planning group and actively participated in group. CSW provided pt with today's workbook. Patient plans to return home to follow up with outpatient services at [facility].  Transportation Means: Pt reports access to transportation  Supports: No supports mentioned at this time  [name], MSW, [employer] Clinical Social Worker [employer] [phone]

## 2015-01-18 LAB — GLUCOSE, CAPILLARY
GLUCOSE-CAPILLARY: 120 mg/dL — AB (ref 65–99)
Glucose-Capillary: 94 mg/dL (ref 65–99)
Glucose-Capillary: 100 mg/dL — ABNORMAL HIGH (ref 65–99)

## 2015-01-18 MED ORDER — CLONAZEPAM 0.5 MG PO TABS
0.5000 mg | ORAL_TABLET | Freq: Every morning | ORAL | Status: DC
  Administered 2015-01-19: 0.5 mg via ORAL

## 2015-01-18 MED ORDER — CLONAZEPAM 1 MG PO TABS
1.0000 mg | ORAL_TABLET | Freq: Every day | ORAL | Status: DC
  Filled 2015-01-18: qty 1

## 2015-01-18 NOTE — Progress Notes (Signed)
Recreation Therapy Notes  Animal-Assisted Activity (AAA) Program Checklist/Progress Notes Patient Eligibility Criteria Checklist & Daily Group note for Rec Tx Intervention  Date: 06.21.16 Time: 2:30 pm Location: [address]  AAA/T Program Assumption of Risk Form signed by Patient/ or Parent Legal Guardian yes  Patient is free of allergies or sever asthma yes  Patient reports no fear of animals yes  Patient reports no history of cruelty to animalsyes  Patient understands his/her participation is voluntary yes  Patient washes hands before animal contact yes  Patient washes hands after animal contact yes  Behavioral Response: Engaged  Education: Charity fundraiser, Appropriate Animal Interaction   Education Outcome: Acknowledges understanding/In group clarification offered   Clinical Observations/Feedback: Patient attended group.   Caroll Rancher, LRT/CTRS         Caroll Rancher A 01/18/2015 4:03 PM

## 2015-01-18 NOTE — [facility] Group Notes (Signed)
[facility] Group Notes:  (Nursing/MHT/Case Management/Adjunct)  Date:  01/18/2015  Time:  0900 am  Type of Therapy:  Psychoeducational Skills  Participation Level:  Active  Participation Quality:  Appropriate and Attentive  Affect:  Appropriate  Cognitive:  Alert and Appropriate  Insight:  Improving  Engagement in Group:  Engaged  Modes of Intervention:  Support  Summary of Progress/Problems: Patient states she is anxious over transfer to [facility].  Had several questions about ECT treatment.  Patient smiled during group which is a great improvement.    Cranford Mon 01/18/2015, 9:23 AM

## 2015-01-18 NOTE — [facility] Group Notes (Signed)
[facility] LCSW Group Therapy  01/18/2015   1:15 PM   Type of Therapy:  Group Therapy  Participation Level:  Minimal  Participation Quality:  Attentive  Affect:  Depressed and Flat  Cognitive:  Alert and Oriented  Insight:  Developing/Improving and Engaged  Engagement in Therapy:  Developing/Improving and Engaged  Modes of Intervention:  Clarification, Confrontation, Discussion, Education, Exploration, Limit-setting, Orientation, Problem-solving, Rapport Building, Dance movement psychotherapist, Socialization and Support  Summary of Progress/Problems: The topic for group therapy was feelings about diagnosis.  Pt actively participated in group discussion on their past and current diagnosis and how they feel towards this.  Pt also identified how society and family members judge them, based on their diagnosis as well as stereotypes and stigmas.  Patient actively listened during group discussion but participated minimally despite CSW encouragement.  [name], MSW, [employer] Clinical Social Worker [facility] [phone]

## 2015-01-18 NOTE — Tx Team (Signed)
Interdisciplinary Treatment Plan Update (Adult) Date: 01/18/2015   Time Reviewed: 9:30 AM  Progress in Treatment: Attending groups: Yes Participating in groups: Minimally Taking medication as prescribed: Yes Tolerating medication: Yes Family/Significant other contact made: Yes, CSW has spoken with patient's husband and son Patient understands diagnosis: Yes Discussing patient identified problems/goals with staff: Yes Medical problems stabilized or resolved: Yes Denies suicidal/homicidal ideation: Yes denies Issues/concerns per patient self-inventory: Yes Other:  New problem(s) identified: N/A  Discharge Plan or Barriers:  6/14: Patient plans to return home to follow up with outpatient services at [facility].   6/17: Patient plans to return home to follow up with outpatient services at [facility]. Patient continues to appear flat and depressed and is endorsing SI at this time.  6/21: Patient continues to appear flat and depressed and is endorsing SI at this time. MD recommending ECT treatment at [facility], referral made 6/20.   Reason for Continuation of Hospitalization:  Depression Anxiety Medication Stabilization   Comments: N/A  Estimated length of stay: 2-3 days  For review of initial/current patient goals, please see plan of care. Patient is a 49 year old Caucasian female admitted for depression and SI. Patient lives in [place] with her family. Stressors include marital issues, possibility of losing house, and medical issues. Patient follows up with [facility] for outpatient services. Patient will benefit from crisis stabilization, medication evaluation, group therapy, and psycho education in addition to case management for discharge planning. Patient and CSW reviewed pt's identified goals and treatment plan. Pt verbalized understanding and agreed to treatment plan.   Attendees: Patient:    Family:    Physician: Dr. Jama Flavors; Dr. Dub Mikes 01/18/2015 9:30 AM   Nursing: Dellia Cloud, [address], [place] , [place] 01/18/2015 9:30 AM  Clinical Social Worker: [name], Chad Cordial  LCSWA 01/18/2015 9:30 AM  Other: Juline Patch, LCSW 01/18/2015 9:30 AM  Other: Leisa Lenz, Vesta Mixer Liaison 01/18/2015 9:30 AM  Other: Onnie Boer, Case Manager 01/18/2015 9:30 AM  Other: Serena Colonel, NP 01/18/2015 9:30 AM  Other:      Scribe for Treatment Team:  [name], MSW, LCSWA [phone]

## 2015-01-18 NOTE — Progress Notes (Signed)
Patient ID: Stacey [name], female   DOB: 28-Apr-1966, 49 y.o.   MRN: [id number] [facility] MD Progress Note  01/18/2015 11:17 AM Stacey [name]  MRN:  [id number] Subjective: She states she is feeling less severely depressed, and states she now feels better. Denies side effects. States she has apprehensions  About possible ECT treatment ( which we have discussed as an option) , but states she has heard it can be very effective so she would agree to it if offered. Denies medication side effects and feels they are working for her .  Objective: I have discussed case with treatment team and have met with patient.  As discussed with staff, patient has presented with improvement over the last day- she has been noted to smile at times appropriately, she is more verbal, and she no longer presents as flat and blunted in affect, with some improved facial expression. As noted, patient reports a subjective sense of improvement as well. Today denies any suicidal or self injurious ideations, and also denies any hallucinations. She states she remains angry with her husband , but denies any thoughts of wanting to hurt him in any way. Due to the severity and protracted nature of her depressive symptoms, we have discussed ECT as an option and patient has expressed interest. We have sent referral to [place]/ Dr. Evans Lance, to review case to consider ECT .  I have also contacted Dr. Evans Lance to discuss case - awaiting call back. At this time, will continue current medication regimen.     Principal Problem: Bipolar I disorder, most recent episode (or current) unspecified Diagnosis:   Patient Active Problem List   Diagnosis Date Noted  . Bipolar I disorder, most recent episode (or current) unspecified [F31.9] 01/09/2015  . Panic attacks [F41.0] 01/09/2015  . Headache(784.0) [R51] 07/10/2013  . Depressive disorder [F32.9] 03/08/2013  . Aphthous ulcer [K12.0] 03/01/2013  . Cellulitis [L03.90] 03/01/2013  . Chest  pain [R07.9] 02/27/2013  . Iron deficiency anemia, unspecified [D50.9] 02/24/2013  . Pernicious anemia [D51.0] 02/24/2013  . Abnormality of gait [R26.9] 12/09/2012  . Dysphagia, unspecified(787.20) [R13.10] 12/08/2012  . DUB (dysfunctional uterine bleeding) [N93.8] 05/30/2011  . Anemia [D64.9] 05/30/2011  . COUGH [R05] 03/29/2010  . SKIN RASH [R21] 09/30/2009  . ACUTE BRONCHITIS [J20.9] 08/10/2009  . ALCOHOLISM [F10.20] 08/05/2009  . DIARRHEA [R19.7] 08/05/2009  . SINUSITIS, ACUTE [J01.90] 07/06/2009  . CELIAC SPRUE [K90.0] 07/06/2009  . 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 THYROID NODULE [E04.1] 03/04/2009  . Allergic rhinitis, cause unspecified [J30.9] 01/27/2009  . OBSTRUCTIVE SLEEP APNEA [G47.30] 01/26/2009  . SHORTNESS OF BREATH (SOB) [R06.02] 12/22/2008  . FIBROMYALGIA [M79.1, M60.9] 12/13/2008   Total Time spent with patient: 20 minutes    Past Medical History:  Past Medical History  Diagnosis Date  . Asthma   . Allergic rhinitis, cause unspecified   . Bipolar 1 disorder   . Morbid obesity   . Thyroid nodule   . Mental disorder   . 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 Pt has never been tested.   . Anxiety   . OSA (obstructive sleep apnea)     uses cpap does not know settings  . Complication of anesthesia 2004    bp dropped after c section, took 24hrs to feels leg , numbing medicine does not last long when injected into skin  . Difficult intravenous access     hard stick for ivs and lab per pt  . Nasal bone fracture age  8    left side cannot place tube in nostril per pt  . Iron deficiency anemia, unspecified 02/24/2013  . Pernicious anemia 02/24/2013  . Anxiety   . Fibromyalgia     Past Surgical History  Procedure Laterality Date  . Tubal ligation    . Cesarean section    . Noraplant    . Wisdom tooth extraction    . Esophagogastroduodenoscopy N/A 01/02/2013    Procedure: ESOPHAGOGASTRODUODENOSCOPY (EGD);  Surgeon: Louis Meckel, MD;   Location: [facility];  Service: Endoscopy;  Laterality: N/A;  . Colonoscopy with propofol N/A 01/02/2013    Procedure: COLONOSCOPY WITH PROPOFOL;  Surgeon: Louis Meckel, MD;  Location: [facility];  Service: Endoscopy;  Laterality: N/A;   Family History:  Family History  Problem Relation Age of Onset  . Breast cancer Mother   . Asthma Mother   . Emphysema Paternal Grandmother   . Diabetes Father    Social History:  History  Alcohol Use  . Yes    Comment: socially     History  Drug Use No    History   Social History  . Marital Status: Married    Spouse Name: Fillmore  . Number of Children: 4  . Years of Education: Bachelors   Occupational History  . Unemployed    Social History Main Topics  . Smoking status: Former Smoker -- 0.50 packs/day for 20 years    Types: Cigarettes    Start date: 02/05/1987    Quit date: 07/30/2006  . Smokeless tobacco: Never Used     Comment: quit 7 years ago  . Alcohol Use: Yes     Comment: socially  . Drug Use: No  . Sexual Activity: Yes    Birth Control/ Protection: Surgical   Other Topics Concern  . None   Social History Narrative   [facility] 4 children 21, 15, 46, 10, she stays at home.   Patient has a Bachelor's degree.   Patient is right- handed.   Patient drinks one cup of coffee in the morning, sometimes 2 cups.         Additional History:    Sleep: Improved  Appetite:  Improved   Assessment:   Musculoskeletal: Strength & Muscle Tone: within normal limits Gait & Station: normal Patient leans: N/A   Psychiatric Specialty Exam: Physical Exam  ROS denies nausea or vomiting. Denies  Chest pain or shortness of breath   Blood pressure 122/66, pulse 91, temperature 98 F (36.7 C), temperature source Oral, resp. rate 18, height 5' 8.5" (1.74 m), weight 326 lb (147.873 kg), last menstrual period 12/09/2014, SpO2 100 %.Body mass index is 48.84 kg/(m^2).  General Appearance:  Grooming is  improved   Eye Contact::  Improved  Speech:  Normal Rate  Volume:  Decreased  Mood:   Patient remains depressed, but states she feels " better"  Affect:   Today noticeably more reactive, smiles at times appropriately, even laughs briefly at times   Thought Process:  Linear  Orientation:  Full (Time, Place, and Person)  Thought Content:  Denies hallucinations and no delusions. Not internally preoccupied   Suicidal Thoughts:  No  Continues to deny any current plan or intention of hurting herself and contracts for safety on the unit   Homicidal Thoughts:  No at this time denies thoughts of hurting husband or anyone else  Memory:  recent and remote grossly intact  Judgement:  Fair  Insight:  improving  Psychomotor Activity:  Decreased-  Concentration:  Fair  Recall:  Good  Fund of Knowledge:Good  Language: Good  Akathisia:  Negative  Handed:  Right  AIMS (if indicated):     Assets:  Resilience  ADL's:  fair  Cognition: WNL  Sleep:  Number of Hours: 6.75     Current Medications: Current Facility-Administered Medications  Medication Dose Route Frequency Provider Last Rate Last Dose  . acetaminophen (TYLENOL) tablet 650 mg  650 mg Oral Q6H PRN Kerrie Buffalo, NP   650 mg at 01/15/15 1125  . albuterol (PROVENTIL HFA;VENTOLIN HFA) 108 (90 BASE) MCG/ACT inhaler 2 puff  2 puff Inhalation TID PRN Kerrie Buffalo, NP      . alum & mag hydroxide-simeth (MAALOX/MYLANTA) 200-200-20 MG/5ML suspension 30 mL  30 mL Oral Q4H PRN Kerrie Buffalo, NP      . beclomethasone (QVAR) 80 MCG/ACT inhaler 2 puff  2 puff Inhalation BID Kerrie Buffalo, NP   2 puff at 01/18/15 0753  . clonazePAM (KLONOPIN) tablet 1 mg  1 mg Oral BID Jenne Campus, MD   1 mg at 01/18/15 0753  . diphenhydrAMINE (BENADRYL) capsule 25 mg  25 mg Oral Q6H PRN Kerrie Buffalo, NP   25 mg at 01/16/15 2123  . divalproex (DEPAKOTE) DR tablet 500 mg  500 mg Oral BID Kerrie Buffalo, NP   500 mg at 01/18/15 0754  . DULoxetine (CYMBALTA)  DR capsule 60 mg  60 mg Oral Daily Jenne Campus, MD   60 mg at 01/18/15 0753  . gabapentin (NEURONTIN) capsule 900 mg  900 mg Oral TID Kerrie Buffalo, NP   900 mg at 01/18/15 0754  . hydrOXYzine (ATARAX/VISTARIL) tablet 25 mg  25 mg Oral TID PRN Kerrie Buffalo, NP   25 mg at 01/17/15 2055  . ibuprofen (ADVIL,MOTRIN) tablet 600 mg  600 mg Oral Q6H PRN Niel Hummer, NP   600 mg at 01/18/15 0438  . magnesium hydroxide (MILK OF MAGNESIA) suspension 30 mL  30 mL Oral Daily PRN Kerrie Buffalo, NP      . methocarbamol (ROBAXIN) tablet 1,000 mg  1,000 mg Oral Q8H PRN Kerrie Buffalo, NP   1,000 mg at 01/18/15 0438  . pantoprazole (PROTONIX) EC tablet 40 mg  40 mg Oral Daily Jenne Campus, MD   40 mg at 01/18/15 0754  . traZODone (DESYREL) tablet 50 mg  50 mg Oral QHS PRN Ursula Alert, MD   50 mg at 01/17/15 2053  . ziprasidone (GEODON) capsule 80 mg  80 mg Oral BID WC Jenne Campus, MD   80 mg at 01/18/15 1962    Lab Results:  Results for orders placed or performed during the hospital encounter of 01/09/15 (from the past 48 hour(s))  Glucose, capillary     Status: Abnormal   Collection Time: 01/17/15  6:25 AM  Result Value Ref Range   Glucose-Capillary 108 (H) 65 - 99 mg/dL  Glucose, capillary     Status: None   Collection Time: 01/17/15  4:59 PM  Result Value Ref Range   Glucose-Capillary 88 65 - 99 mg/dL  Glucose, capillary     Status: None   Collection Time: 01/18/15  6:34 AM  Result Value Ref Range   Glucose-Capillary 94 65 - 99 mg/dL    Physical Findings: AIMS: Facial and Oral Movements Muscles of Facial Expression: None, normal Lips and Perioral Area: None, normal Jaw: None, normal Tongue: None, normal,Extremity Movements Upper (arms, wrists, hands, fingers): None, normal Lower (  legs, knees, ankles, toes): None, normal, Trunk Movements Neck, shoulders, hips: None, normal, Overall Severity Severity of abnormal movements (highest score from questions above): None,  normal Incapacitation due to abnormal movements: None, normal Patient's awareness of abnormal movements (rate only patient's report): No Awareness, Dental Status Current problems with teeth and/or dentures?: No Does patient usually wear dentures?: No  CIWA:  CIWA-Ar Total: 4 COWS:     Assessment:  Patient has had severe and protracted depressive episodes and has been presenting with very flat, blunted affect, psychomotor retardation, and low energy. She has had recent passive thoughts of death, dying, but has denied suicidal plan or intention. She has reported subjective improvement over the last couple of days but symptoms appeared unchanged. Today she does seem better, and this has been noticed by Probation officer and staff- grooming is improved, eye contact is better, and range of affect is improved . She does remain depressed, but to lesser degree of severity. Due to severity of depression we have discussed ECT as option and she is interested in exploring this option if available- ECT located at [facility], done by Dr. Evans Lance- have sent referral and have contacted him, to see if ECT a current option, particularly now that patient is starting to respond to medication. Chronic Pain is improved , controlled with Neurontin.  Sleep improved on Trazodone.  Anxiety has decreased with antidepressant, Neurontin and Klonopin. Treatment Plan Summary: Daily contact with patient to assess and evaluate symptoms and progress in treatment, Medication management, Plan Ongoing patient admission and continue medications as below  Continue Depakote ER 500 mgs BID for management of mood disorder. Continue  Geodon 80 mgs BID for management of mood disorder. Decrease Klonopin to 0.5 mgrs QAM and 1 mgr QHS for management of anxiety-decrease to limit potential side effects, taper gradually as she improves. Continue Neurontin 900 mgs TID for management of chronic pain. Continue Trazodone 50 mgs QHS PRN for management for  insomnia as needed. Continue  Cymbalta 60 mgs QDAY for management of depression. Continue  Protonix for GERD management. Consider ECT to address ongoing depression.     Medical Decision Making:  Established Problem, Stable/Improving (1), Review of Psycho-Social Stressors (1), Review or order clinical lab tests (1) and Review of Medication Regimen & Side Effects (2)     Karis Emig 01/18/2015, 11:17 AM

## 2015-01-18 NOTE — Clinical Social Work Note (Signed)
CSW spoke with son Timothy Lasso [phone] regarding patient's progress and treatment. Son reports that he had a pleasant visit with her on Sunday evening and that he notices that she seems to be more calm than usual.  [name], MSW, [facility] Clinical Social Worker [facility] [phone]

## 2015-01-18 NOTE — Progress Notes (Signed)
D: Patient states her depressive symptoms have decreased.  She rates her depression as a 3; hopelessness as a 5; anxiety as a 5.  Patient actually smiled during group and was engaged.  She has some concerns about ECT treatment. Patient is to transfer to [facility] to receive treatments for her depression.  She denies any SI today.  Denies HI/AVH.  Her goal today is to "go to the dayroom and not sleep so much.  Work on [employer]."  She is sleeping fair; energy level is low.   A: Continue to monitor medication management and MD orders.  Safety checks completed every 15 minutes per protocol.  Meet 1:1 with patient to discuss concerns and offer encouragement. R: Patient's behavior is appropriate to situation.

## 2015-01-19 LAB — GLUCOSE, CAPILLARY
GLUCOSE-CAPILLARY: 151 mg/dL — AB (ref 65–99)
GLUCOSE-CAPILLARY: 82 mg/dL (ref 65–99)
GLUCOSE-CAPILLARY: 118 mg/dL — AB (ref 65–99)
Glucose-Capillary: 84 mg/dL (ref 65–99)

## 2015-01-19 MED ORDER — CLONAZEPAM 0.5 MG PO TABS
0.5000 mg | ORAL_TABLET | Freq: Two times a day (BID) | ORAL | Status: DC
  Administered 2015-01-20 – 2015-01-21 (×3): 0.5 mg via ORAL
  Filled 2015-01-19 (×3): qty 1

## 2015-01-19 NOTE — Progress Notes (Signed)
D: Pt remains flat and depressed. She denies current SI/HI/AVH and contracts for safety.  A: Support given. Verbalization encouraged. Pt encouraged to come to staff for any concerns. Medications given as prescribed. R: Pt is receptive. No complaints of pain or discomfort at this time. Q15 min safety checks maintained. Pt remains safe on the unit. Will continue to monitor.

## 2015-01-19 NOTE — [facility] Group Notes (Signed)
   [facility] LCSW Aftercare Discharge Planning Group Note  01/19/2015  8:45 AM   Participation Quality: Alert, Appropriate and Oriented  Mood/Affect: Depressed and Flat  Depression Rating: 2  Anxiety Rating: 5  Thoughts of Suicide: Pt denies SI/HI  Will you contract for safety? Yes  Current AVH: Pt denies  Plan for Discharge/Comments: Pt attended discharge planning group and actively participated in group. CSW provided pt with today's workbook. Patient reports noticing improvements in her mood since admission and feels that the medications are working well. She hopes to discharge home.   Transportation Means: Pt reports access to transportation  Supports: No supports mentioned at this time  [name], MSW, [employer] Clinical Social Worker [employer] [phone]

## 2015-01-19 NOTE — Clinical Social Work Note (Signed)
CSW spoke with Jerilynn Som in Assessment at [facility] regarding patient transfer for ECT treatment. Awaiting call back.  [name], MSW, [employer] Clinical Social Worker [facility] [phone]

## 2015-01-19 NOTE — Progress Notes (Signed)
Recreation Therapy Notes  Date: 06.22.16 Time: 9:30 am Location: [address] Dayroom  Group Topic: Stress Management  Goal Area(s) Addresses:  Patient will verbalize importance of using healthy stress management.  Patient will identify positive emotions associated with healthy stress management.   Intervention: Guided Imagery Script  Activity :  LRT will introduce and educate patient on the stress management technique of guided imagery.  A script was used to deliver the technique to patients.  Patients were asked to follow the script read aloud by the LRT to engage in practicing guided imagery.  Education:  Stress Management, Discharge Planning.   Clinical Observations/Feedback: Patient did not attend group.  Danisha Brassfield, LRT/CTRS         Zeki Bedrosian A 01/19/2015 4:43 PM 

## 2015-01-19 NOTE — Clinical Social Work Note (Addendum)
CSW spoke with Stacey [name] in Assessment at [facility] regarding patient transfer for ECT treatment. Awaiting call back.  [name], MSW, [employer] Clinical Social Worker [facility] [phone]

## 2015-01-19 NOTE — [facility] Group Notes (Signed)
[facility] LCSW Group Therapy 01/19/2015  1:15 PM Type of Therapy: Group Therapy Participation Level: Active  Participation Quality: Attentive, Sharing and Supportive  Affect: Appropriate  Cognitive: Alert and Oriented  Insight: Developing/Improving and Engaged  Engagement in Therapy: Developing/Improving and Engaged  Modes of Intervention: Clarification, Confrontation, Discussion, Education, Exploration, Limit-setting, Orientation, Problem-solving, Rapport Building, Reality Testing, Socialization and Support  Summary of Progress/Problems: The topic for group today was emotional regulation. This group focused on both positive and negative emotion identification and allowed group members to process ways to identify feelings, regulate negative emotions, and find healthy ways to manage internal/external emotions. Group members were asked to reflect on a time when their reaction to an emotion led to a negative outcome and explored how alternative responses using emotion regulation would have benefited them. Group members were also asked to discuss a time when emotion regulation was utilized when a negative emotion was experienced. Patient engaged in discussion of emotions, stressors, and healthy vs. Unhealthy coping skills.   Caledonia Zou, MSW, LCSWA Clinical Social Worker [facility] [phone]    

## 2015-01-19 NOTE — Progress Notes (Signed)
Patient ID: Stacey [name], female   DOB: 09-21-1965, 49 y.o.   MRN: [id number] [facility] MD Progress Note  01/19/2015 5:48 PM Stacey [name]  MRN:  [id number] Subjective:  Reports partial improvement compared to admission.Remains depressed, but states she feels less severely so. Denies medication side effects.  Objective: I have discussed case with treatment team and have met with patient.  Over the last few days there has been some partial improvement- this has been noticeable insofar as somewhat improved range of speech, slightly improved range of affect, and less severe psychomotor retardation. She still feels depressed, however, and improvement has been partial. We have been considering ECT as a treatment option as patient has a long history of Mood Disorder, and states she has failed several prior antidepressant / mood stabilizer treatments. She does feel she is having some degree of response to current regimen, which thus far she is tolerating well. She has expressed interest in ECT- we have reviewed rationale and side effect profile,  and today I have reviewed case with Dr. Evans Lance, who provides inpatient ECTs at [facility]. He agreed to referral to initiate said treatment.  Remains ruminative about marital stressors , separation. Some increased group participation. Denies medication side effects.     Principal Problem: Bipolar I disorder, most recent episode (or current) unspecified Diagnosis:   Patient Active Problem List   Diagnosis Date Noted  . Bipolar I disorder, most recent episode (or current) unspecified [F31.9] 01/09/2015  . Panic attacks [F41.0] 01/09/2015  . Headache(784.0) [R51] 07/10/2013  . Depressive disorder [F32.9] 03/08/2013  . Aphthous ulcer [K12.0] 03/01/2013  . Cellulitis [L03.90] 03/01/2013  . Chest pain [R07.9] 02/27/2013  . Iron deficiency anemia, unspecified [D50.9] 02/24/2013  . Pernicious anemia [D51.0] 02/24/2013  .  Abnormality of gait [R26.9] 12/09/2012  . Dysphagia, unspecified(787.20) [R13.10] 12/08/2012  . DUB (dysfunctional uterine bleeding) [N93.8] 05/30/2011  . Anemia [D64.9] 05/30/2011  . COUGH [R05] 03/29/2010  . SKIN RASH [R21] 09/30/2009  . ACUTE BRONCHITIS [J20.9] 08/10/2009  . ALCOHOLISM [F10.20] 08/05/2009  . DIARRHEA [R19.7] 08/05/2009  . SINUSITIS, ACUTE [J01.90] 07/06/2009  . CELIAC SPRUE [K90.0] 07/06/2009  . 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 THYROID NODULE [E04.1] 03/04/2009  . Allergic rhinitis, cause unspecified [J30.9] 01/27/2009  . OBSTRUCTIVE SLEEP APNEA [G47.30] 01/26/2009  . SHORTNESS OF BREATH (SOB) [R06.02] 12/22/2008  . FIBROMYALGIA [M79.1, M60.9] 12/13/2008   Total Time spent with patient: 20 minutes    Past Medical History:  Past Medical History  Diagnosis Date  . Asthma   . Allergic rhinitis, cause unspecified   . Bipolar 1 disorder   . Morbid obesity   . Thyroid nodule   . Mental disorder   . 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 Pt has never been tested.   . Anxiety   . OSA (obstructive sleep apnea)     uses cpap does not know settings  . Complication of anesthesia 2004    bp dropped after c section, took 24hrs to feels leg , numbing medicine does not last long when injected into skin  . Difficult intravenous access     hard stick for ivs and lab per pt  . Nasal bone fracture age 23    left side cannot place tube in nostril per pt  . Iron deficiency anemia, unspecified 02/24/2013  . Pernicious anemia 02/24/2013  . Anxiety   . Fibromyalgia     Past Surgical History  Procedure Laterality Date  .  Tubal ligation    . Cesarean section    . Noraplant    . Wisdom tooth extraction    . Esophagogastroduodenoscopy N/A 01/02/2013    Procedure: ESOPHAGOGASTRODUODENOSCOPY (EGD);  Surgeon: Inda Castle, MD;  Location: [facility];  Service: Endoscopy;  Laterality: N/A;  . Colonoscopy with propofol N/A 01/02/2013    Procedure: COLONOSCOPY  WITH PROPOFOL;  Surgeon: Inda Castle, MD;  Location: [facility];  Service: Endoscopy;  Laterality: N/A;   Family History:  Family History  Problem Relation Age of Onset  . Breast cancer Mother   . Asthma Mother   . Emphysema Paternal Grandmother   . Diabetes Father    Social History:  History  Alcohol Use  . Yes    Comment: socially     History  Drug Use No    History   Social History  . Marital Status: Married    Spouse Name: Dodson Branch  . Number of Children: 4  . Years of Education: Bachelors   Occupational History  . Unemployed    Social History Main Topics  . Smoking status: Former Smoker -- 0.50 packs/day for 20 years    Types: Cigarettes    Start date: 02/05/1987    Quit date: 07/30/2006  . Smokeless tobacco: Never Used     Comment: quit 7 years ago  . Alcohol Use: Yes     Comment: socially  . Drug Use: No  . Sexual Activity: Yes    Birth Control/ Protection: Surgical   Other Topics Concern  . None   Social History Narrative   [facility] 4 children 21, 15, 74, 10, she stays at home.   Patient has a Bachelor's degree.   Patient is right- handed.   Patient drinks one cup of coffee in the morning, sometimes 2 cups.         Additional History:    Sleep: Improved  Appetite:  Improved   Assessment:   Musculoskeletal: Strength & Muscle Tone: within normal limits Gait & Station: normal Patient leans: N/A   Psychiatric Specialty Exam: Physical Exam  ROS denies nausea or vomiting. Denies  Chest pain or shortness of breath   Blood pressure 130/79, pulse 98, temperature 98 F (36.7 C), temperature source Oral, resp. rate 19, height 5' 8.5" (1.74 m), weight 326 lb (147.873 kg), last menstrual period 12/09/2014, SpO2 100 %.Body mass index is 48.84 kg/(m^2).  General Appearance:  Grooming is improved   Eye Contact::  Improved  Speech:  Normal Rate  Volume:  Decreased  Mood:   Depressed, but modest/partial improvement has  been noted compared to admission  Affect:   Remains constricted but more reactive , smiles briefly at times   Thought Process:  Linear  Orientation:  Full (Time, Place, and Person)  Thought Content:  Denies hallucinations and no delusions. Not internally preoccupied   Suicidal Thoughts:  No  Continues to deny any current plan or intention of hurting herself and contracts for safety on the unit   Homicidal Thoughts:  No at this time denies thoughts of hurting husband or anyone else  Memory:  recent and remote grossly intact  Judgement:  Fair  Insight:  improving   Psychomotor Activity:   Remains decreased   Concentration:  Fair  Recall:  Good  Fund of Knowledge:Good  Language: Good  Akathisia:  Negative  Handed:  Right  AIMS (if indicated):     Assets:  Resilience  ADL's:  fair  Cognition:  WNL  Sleep:  Number of Hours: 5.25     Current Medications: Current Facility-Administered Medications  Medication Dose Route Frequency Provider Last Rate Last Dose  . acetaminophen (TYLENOL) tablet 650 mg  650 mg Oral Q6H PRN Kerrie Buffalo, NP   650 mg at 01/15/15 1125  . albuterol (PROVENTIL HFA;VENTOLIN HFA) 108 (90 BASE) MCG/ACT inhaler 2 puff  2 puff Inhalation TID PRN Kerrie Buffalo, NP      . alum & mag hydroxide-simeth (MAALOX/MYLANTA) 200-200-20 MG/5ML suspension 30 mL  30 mL Oral Q4H PRN Kerrie Buffalo, NP      . beclomethasone (QVAR) 80 MCG/ACT inhaler 2 puff  2 puff Inhalation BID Kerrie Buffalo, NP   2 puff at 01/19/15 0741  . clonazePAM (KLONOPIN) tablet 0.5 mg  0.5 mg Oral q morning - 10a Jenne Campus, MD   0.5 mg at 01/19/15 0743  . clonazePAM (KLONOPIN) tablet 1 mg  1 mg Oral QHS Miki Labuda A Javarious Elsayed, MD      . diphenhydrAMINE (BENADRYL) capsule 25 mg  25 mg Oral Q6H PRN Kerrie Buffalo, NP   25 mg at 01/16/15 2123  . divalproex (DEPAKOTE) DR tablet 500 mg  500 mg Oral BID Kerrie Buffalo, NP   500 mg at 01/19/15 0740  . DULoxetine (CYMBALTA) DR capsule 60 mg  60 mg Oral Daily  Jenne Campus, MD   60 mg at 01/19/15 0740  . gabapentin (NEURONTIN) capsule 900 mg  900 mg Oral TID Kerrie Buffalo, NP   900 mg at 01/19/15 1657  . hydrOXYzine (ATARAX/VISTARIL) tablet 25 mg  25 mg Oral TID PRN Kerrie Buffalo, NP   25 mg at 01/17/15 2055  . ibuprofen (ADVIL,MOTRIN) tablet 600 mg  600 mg Oral Q6H PRN Niel Hummer, NP   600 mg at 01/19/15 0456  . magnesium hydroxide (MILK OF MAGNESIA) suspension 30 mL  30 mL Oral Daily PRN Kerrie Buffalo, NP      . methocarbamol (ROBAXIN) tablet 1,000 mg  1,000 mg Oral Q8H PRN Kerrie Buffalo, NP   1,000 mg at 01/19/15 0509  . pantoprazole (PROTONIX) EC tablet 40 mg  40 mg Oral Daily Jenne Campus, MD   40 mg at 01/19/15 0740  . traZODone (DESYREL) tablet 50 mg  50 mg Oral QHS PRN Ursula Alert, MD   50 mg at 01/18/15 2108  . ziprasidone (GEODON) capsule 80 mg  80 mg Oral BID WC Jenne Campus, MD   80 mg at 01/19/15 1657    Lab Results:  Results for orders placed or performed during the hospital encounter of 01/09/15 (from the past 48 hour(s))  Glucose, capillary     Status: None   Collection Time: 01/18/15  6:34 AM  Result Value Ref Range   Glucose-Capillary 94 65 - 99 mg/dL  Glucose, capillary     Status: Abnormal   Collection Time: 01/18/15  5:13 PM  Result Value Ref Range   Glucose-Capillary 120 (H) 65 - 99 mg/dL   Comment 1 Notify RN   Glucose, capillary     Status: Abnormal   Collection Time: 01/18/15  8:30 PM  Result Value Ref Range   Glucose-Capillary 100 (H) 65 - 99 mg/dL  Glucose, capillary     Status: Abnormal   Collection Time: 01/19/15  6:07 AM  Result Value Ref Range   Glucose-Capillary 151 (H) 65 - 99 mg/dL  Glucose, capillary     Status: None   Collection Time: 01/19/15 12:02 PM  Result Value  Ref Range   Glucose-Capillary 82 65 - 99 mg/dL   Comment 1 Notify RN    Comment 2 Document in Chart   Glucose, capillary     Status: Abnormal   Collection Time: 01/19/15  5:03 PM  Result Value Ref Range    Glucose-Capillary 118 (H) 65 - 99 mg/dL    Physical Findings: AIMS: Facial and Oral Movements Muscles of Facial Expression: None, normal Lips and Perioral Area: None, normal Jaw: None, normal Tongue: None, normal,Extremity Movements Upper (arms, wrists, hands, fingers): None, normal Lower (legs, knees, ankles, toes): None, normal, Trunk Movements Neck, shoulders, hips: None, normal, Overall Severity Severity of abnormal movements (highest score from questions above): None, normal Incapacitation due to abnormal movements: None, normal Patient's awareness of abnormal movements (rate only patient's report): No Awareness, Dental Status Current problems with teeth and/or dentures?: No Does patient usually wear dentures?: No  CIWA:  CIWA-Ar Total: 4 COWS:     Assessment:   Partial improvement of mood and affect on current medication regimen, but still depressed, sad, and ruminative . Some degree of psychomotor retardation still noted. Has history of not responding well to prior medication trials. Interested in [place], and as discussed with Dr. Evans Lance , may benefit from this treatment modality to address residual but still significant symptoms of depression.  Treatment Plan Summary: Daily contact with patient to assess and evaluate symptoms and progress in treatment, Medication management, Plan Ongoing patient admission and continue medications as below  Continue Depakote ER 500 mgs BID for management of mood disorder- patient aware this medication may need to be D/Cd due to its antiseizure properties , if she gets ECT .  Continue  Geodon 80 mgs BID for management of mood disorder. Decrease Klonopin to 0.5 mgrs  BID  Continue Neurontin 900 mgs TID for management of chronic pain. Continue Trazodone 50 mgs QHS PRN for management for insomnia as needed. Continue  Cymbalta 60 mgs QDAY for management of depression. Continue  Protonix for GERD management. At this time CSW/Team working on transfer to  [facility] under the care of Dr. Evans Lance to consider, initiate ECT treatment.     Medical Decision Making:  Established Problem, Stable/Improving (1), Review of Psycho-Social Stressors (1), Review or order clinical lab tests (1) and Review of Medication Regimen & Side Effects (2)     Rozell Kettlewell 01/19/2015, 5:48 PM

## 2015-01-19 NOTE — Progress Notes (Signed)
Pt attended wrap up group. Pt rated her day a 5 out of 10. Pt shared she had a bad phone call with her husband, and said that last night her "nurse was not very helpful." Pt shared that a good thing about her day was that she had good conversations. Pt said a goal she has for herself after discharge is to go to mental health association programs.

## 2015-01-19 NOTE — Progress Notes (Signed)
D: patient reports depressed depressive symptoms.  She rates her depression and hopelessness as a 2, which is an improvement from admission.  She rates her anxiety as a 6.  Patient is interacting more with staff and her peers.  She continues to take naps during the day.  She attempts to attend group and participate.  She denies any SI today; she denies HI/AVH.  Patient's affect is slightly more animated.  She is less isolative to room. She remains with flat affect.  Her goal today is to "see what it takes to be discharged."  Patient is being referred to [facility] for ECT treatments. A: Continue to monitor medication management and MD orders.  Safety checks completed every 15 minutes per protocol.  Meet 1;1 with patient to discuss concerns and offer encouragement. R: Patient's behavior is appropriate to situation.

## 2015-01-20 LAB — GLUCOSE, CAPILLARY
GLUCOSE-CAPILLARY: 129 mg/dL — AB (ref 65–99)
GLUCOSE-CAPILLARY: 78 mg/dL (ref 65–99)
Glucose-Capillary: 124 mg/dL — ABNORMAL HIGH (ref 65–99)

## 2015-01-20 LAB — VITAMIN D 1,25 DIHYDROXY
VITAMIN D 1, 25 (OH) TOTAL: 45 pg/mL
Vitamin D3 1, 25 (OH)2: 45 pg/mL

## 2015-01-20 NOTE — Clinical Social Work Note (Signed)
CSW spoke with Stacey [name] at [facility] regarding ECT referral. Patient has been accepted for treatment but no beds available at this time for an inpatient transfer.   [name], MSW, [employer] Clinical Social Worker [facility] [phone]

## 2015-01-20 NOTE — Progress Notes (Signed)
Pt did not attend karaoke group this evening.  

## 2015-01-20 NOTE — Progress Notes (Addendum)
Patient ID: Stacey [name], female   DOB: [birth year], 49 y.o.   MRN: [id number]   Pt currently presents with a flat affect and depressed behavior. Pt has slow motor activity and remains in bed for most of the day. Pt does attend and participate in groups, however interaction is limited. Pt presents as sullen. Per self inventory, pt rates depression at a 5, hopelessness 6 and anxiety 5. Pt's daily goal is to "stay awake during groups" and they intend to do so by "rest in between groups." Pt reports good sleep, poor concentration, low energy and a good appetite.   Pt provided with medications per providers orders. Pt's labs and vitals were monitored throughout the day. Pt supported emotionally and encouraged to express concerns and questions. Pt educated on medications and sleep hygiene.  Pt's safety ensured with 15 minute and environmental checks. Pt currently denies SI/HI and A/V hallucinations. Pt verbally agrees to seek staff if SI/HI or A/VH occurs and to consult with staff before acting on these thoughts. Will continue POC.

## 2015-01-20 NOTE — Progress Notes (Signed)
Patient ID: Stacey [name], female   DOB: [birth year], 49 y.o.   MRN: [id number] [facility] MD Progress Note  01/20/2015 5:21 PM Stacey [name]  MRN:  [id number] Subjective:  Today states she feels more depressed and is feeling " bummed out". She attributes this to a phone conversation with her husband which did not go well, and reminded her of his decision to separate from her. Denies medication side effects.  Objective: I have discussed case with treatment team and have met with patient.  As above, today more depressed, sad- continues to have blunted affect, but today affect better described as constricted, as she was tearful and expressed sadness, rather than affective restriction. She remains psychmotorically retarded, slow, and today has spent most day in bed, although up for meals. Denies medication side effects. Denies suicidal ideations but states she feels overwhelmed by husband's separation and states " I have never been alone before".  Affect did improve partially with support, and review of how her children have been very invested in her well being and live with her. She denies SI, and is able to identify love for children as protective factor against suicide. Denies psychotic symptoms.     Principal Problem: Bipolar I disorder, most recent episode (or current) unspecified Diagnosis:   Patient Active Problem List   Diagnosis Date Noted  . Bipolar I disorder, most recent episode (or current) unspecified [F31.9] 01/09/2015  . Panic attacks [F41.0] 01/09/2015  . Headache(784.0) [R51] 07/10/2013  . Depressive disorder [F32.9] 03/08/2013  . Aphthous ulcer [K12.0] 03/01/2013  . Cellulitis [L03.90] 03/01/2013  . Chest pain [R07.9] 02/27/2013  . Iron deficiency anemia, unspecified [D50.9] 02/24/2013  . Pernicious anemia [D51.0] 02/24/2013  . Abnormality of gait [R26.9] 12/09/2012  . Dysphagia, unspecified(787.20) [R13.10] 12/08/2012  . DUB (dysfunctional uterine bleeding)  [N93.8] 05/30/2011  . Anemia [D64.9] 05/30/2011  . COUGH [R05] 03/29/2010  . SKIN RASH [R21] 09/30/2009  . ACUTE BRONCHITIS [J20.9] 08/10/2009  . ALCOHOLISM [F10.20] 08/05/2009  . DIARRHEA [R19.7] 08/05/2009  . SINUSITIS, ACUTE [J01.90] 07/06/2009  . CELIAC SPRUE [K90.0] 07/06/2009  . 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 THYROID NODULE [E04.1] 03/04/2009  . Allergic rhinitis, cause unspecified [J30.9] 01/27/2009  . OBSTRUCTIVE SLEEP APNEA [G47.30] 01/26/2009  . SHORTNESS OF BREATH (SOB) [R06.02] 12/22/2008  . FIBROMYALGIA [M79.1, M60.9] 12/13/2008   Total Time spent with patient: 20 minutes    Past Medical History:  Past Medical History  Diagnosis Date  . Asthma   . Allergic rhinitis, cause unspecified   . Bipolar 1 disorder   . Morbid obesity   . Thyroid nodule   . Mental disorder   . 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 Pt has never been tested.   . Anxiety   . OSA (obstructive sleep apnea)     uses cpap does not know settings  . Complication of anesthesia 2004    bp dropped after c section, took 24hrs to feels leg , numbing medicine does not last long when injected into skin  . Difficult intravenous access     hard stick for ivs and lab per pt  . Nasal bone fracture age 82    left side cannot place tube in nostril per pt  . Iron deficiency anemia, unspecified 02/24/2013  . Pernicious anemia 02/24/2013  . Anxiety   . Fibromyalgia     Past Surgical History  Procedure Laterality Date  . Tubal ligation    . Cesarean section    .  Noraplant    . Wisdom tooth extraction    . Esophagogastroduodenoscopy N/A 01/02/2013    Procedure: ESOPHAGOGASTRODUODENOSCOPY (EGD);  Surgeon: Inda Castle, MD;  Location: [facility];  Service: Endoscopy;  Laterality: N/A;  . Colonoscopy with propofol N/A 01/02/2013    Procedure: COLONOSCOPY WITH PROPOFOL;  Surgeon: Inda Castle, MD;  Location: [facility];  Service: Endoscopy;  Laterality: N/A;   Family History:   Family History  Problem Relation Age of Onset  . Breast cancer Mother   . Asthma Mother   . Emphysema Paternal Grandmother   . Diabetes Father    Social History:  History  Alcohol Use  . Yes    Comment: socially     History  Drug Use No    History   Social History  . Marital Status: Married    Spouse Name: Haskell  . Number of Children: 4  . Years of Education: Bachelors   Occupational History  . Unemployed    Social History Main Topics  . Smoking status: Former Smoker -- 0.50 packs/day for 20 years    Types: Cigarettes    Start date: 02/05/1987    Quit date: 07/30/2006  . Smokeless tobacco: Never Used     Comment: quit 7 years ago  . Alcohol Use: Yes     Comment: socially  . Drug Use: No  . Sexual Activity: Yes    Birth Control/ Protection: Surgical   Other Topics Concern  . None   Social History Narrative   [facility] 4 children 21, 15, 54, 10, she stays at home.   Patient has a Bachelor's degree.   Patient is right- handed.   Patient drinks one cup of coffee in the morning, sometimes 2 cups.         Additional History:    Sleep: Improved  Appetite:  Improved   Assessment:   Musculoskeletal: Strength & Muscle Tone: within normal limits Gait & Station: normal Patient leans: N/A   Psychiatric Specialty Exam: Physical Exam  ROS denies nausea or vomiting. Denies  Chest pain or shortness of breath   Blood pressure 103/65, pulse 105, temperature 98 F (36.7 C), temperature source Oral, resp. rate 20, height 5' 8.5" (1.74 m), weight 326 lb (147.873 kg), last menstrual period 12/09/2014, SpO2 100 %.Body mass index is 48.84 kg/(m^2).  General Appearance:  Grooming is improved   Eye Contact::  Improved  Speech:  Normal Rate  Volume:  Decreased  Mood:   More depressed today after phone call with husband  Affect:   Remains constricted  Thought Process:  Linear  Orientation:  Full (Time, Place, and Person)  Thought Content:   Denies hallucinations and no delusions. Not internally preoccupied   Suicidal Thoughts:  No  Continues to deny any current plan or intention of hurting herself and contracts for safety on the unit   Homicidal Thoughts:  No at this time denies thoughts of hurting husband or anyone else  Memory:  recent and remote grossly intact  Judgement:  Fair  Insight:  improving   Psychomotor Activity:   Remains decreased   Concentration:  Fair  Recall:  Good  Fund of Knowledge:Good  Language: Good  Akathisia:  Negative  Handed:  Right  AIMS (if indicated):     Assets:  Resilience  ADL's:  fair  Cognition: WNL  Sleep:  Number of Hours: 6.5     Current Medications: Current Facility-Administered Medications  Medication Dose Route Frequency Provider  Last Rate Last Dose  . acetaminophen (TYLENOL) tablet 650 mg  650 mg Oral Q6H PRN Kerrie Buffalo, NP   650 mg at 01/15/15 1125  . albuterol (PROVENTIL HFA;VENTOLIN HFA) 108 (90 BASE) MCG/ACT inhaler 2 puff  2 puff Inhalation TID PRN Kerrie Buffalo, NP      . alum & mag hydroxide-simeth (MAALOX/MYLANTA) 200-200-20 MG/5ML suspension 30 mL  30 mL Oral Q4H PRN Kerrie Buffalo, NP      . beclomethasone (QVAR) 80 MCG/ACT inhaler 2 puff  2 puff Inhalation BID Kerrie Buffalo, NP   2 puff at 01/20/15 0804  . clonazePAM (KLONOPIN) tablet 0.5 mg  0.5 mg Oral BID Jenne Campus, MD   0.5 mg at 01/20/15 1717  . diphenhydrAMINE (BENADRYL) capsule 25 mg  25 mg Oral Q6H PRN Kerrie Buffalo, NP   25 mg at 01/16/15 2123  . divalproex (DEPAKOTE) DR tablet 500 mg  500 mg Oral BID Kerrie Buffalo, NP   500 mg at 01/20/15 0805  . DULoxetine (CYMBALTA) DR capsule 60 mg  60 mg Oral Daily Jenne Campus, MD   60 mg at 01/20/15 0804  . gabapentin (NEURONTIN) capsule 900 mg  900 mg Oral TID Kerrie Buffalo, NP   900 mg at 01/20/15 1716  . hydrOXYzine (ATARAX/VISTARIL) tablet 25 mg  25 mg Oral TID PRN Kerrie Buffalo, NP   25 mg at 01/17/15 2055  . ibuprofen (ADVIL,MOTRIN) tablet  600 mg  600 mg Oral Q6H PRN Niel Hummer, NP   600 mg at 01/20/15 1157  . magnesium hydroxide (MILK OF MAGNESIA) suspension 30 mL  30 mL Oral Daily PRN Kerrie Buffalo, NP      . methocarbamol (ROBAXIN) tablet 1,000 mg  1,000 mg Oral Q8H PRN Kerrie Buffalo, NP   1,000 mg at 01/19/15 2105  . pantoprazole (PROTONIX) EC tablet 40 mg  40 mg Oral Daily Jenne Campus, MD   40 mg at 01/20/15 0805  . traZODone (DESYREL) tablet 50 mg  50 mg Oral QHS PRN Ursula Alert, MD   50 mg at 01/19/15 2145  . ziprasidone (GEODON) capsule 80 mg  80 mg Oral BID WC Jenne Campus, MD   80 mg at 01/20/15 1716    Lab Results:  Results for orders placed or performed during the hospital encounter of 01/09/15 (from the past 48 hour(s))  Glucose, capillary     Status: Abnormal   Collection Time: 01/18/15  8:30 PM  Result Value Ref Range   Glucose-Capillary 100 (H) 65 - 99 mg/dL  Glucose, capillary     Status: Abnormal   Collection Time: 01/19/15  6:07 AM  Result Value Ref Range   Glucose-Capillary 151 (H) 65 - 99 mg/dL  Glucose, capillary     Status: None   Collection Time: 01/19/15 12:02 PM  Result Value Ref Range   Glucose-Capillary 82 65 - 99 mg/dL   Comment 1 Notify RN    Comment 2 Document in Chart   Glucose, capillary     Status: Abnormal   Collection Time: 01/19/15  5:03 PM  Result Value Ref Range   Glucose-Capillary 118 (H) 65 - 99 mg/dL  Glucose, capillary     Status: None   Collection Time: 01/19/15  8:54 PM  Result Value Ref Range   Glucose-Capillary 84 65 - 99 mg/dL  Glucose, capillary     Status: Abnormal   Collection Time: 01/20/15 11:55 AM  Result Value Ref Range   Glucose-Capillary 129 (H) 65 -  99 mg/dL   Comment 1 Notify RN    Comment 2 Document in Chart   Glucose, capillary     Status: None   Collection Time: 01/20/15  5:02 PM  Result Value Ref Range   Glucose-Capillary 78 65 - 99 mg/dL   Comment 1 Notify RN    Comment 2 Document in Chart     Physical Findings: AIMS: Facial  and Oral Movements Muscles of Facial Expression: None, normal Lips and Perioral Area: None, normal Jaw: None, normal Tongue: None, normal,Extremity Movements Upper (arms, wrists, hands, fingers): None, normal Lower (legs, knees, ankles, toes): None, normal, Trunk Movements Neck, shoulders, hips: None, normal, Overall Severity Severity of abnormal movements (highest score from questions above): None, normal Incapacitation due to abnormal movements: None, normal Patient's awareness of abnormal movements (rate only patient's report): No Awareness, Dental Status Current problems with teeth and/or dentures?: No Does patient usually wear dentures?: No  CIWA:  CIWA-Ar Total: 4 COWS:     Assessment:   Increased depression and psychomotor retardation, feelings of being overwhelmed today, following an upsetting phone call with husband yesterday. Denies active SI and is able to contract for safety at this time and to identify her children as a reason not to hurt self . Still interested in ECT and at this time pending transfer to [facility] /Dr. CLaypax once bed available ( CSW reports patient has been tentatively accepted there but no bed available today)  Treatment Plan Summary: Daily contact with patient to assess and evaluate symptoms and progress in treatment, Medication management, Plan Ongoing patient admission and continue medications as below  Continue Depakote ER 500 mgs BID for management of mood disorder- patient aware this medication may need to be D/Cd due to its antiseizure properties , if she gets ECT .  Continue  Geodon 80 mgs BID for management of mood disorder. Decrease Klonopin to 0.5 mgrs  BID  Continue Neurontin 900 mgs TID for management of chronic pain. Continue Trazodone 50 mgs QHS PRN for management for insomnia as needed. Continue  Cymbalta 60 mgs QDAY for management of depression. Continue  Protonix for GERD management. At this time CSW/Team  Continuing to work  on transfer  to [facility] under the care of Dr. Evans Lance to consider, initiate ECT treatment.      Medical Decision Making:  Established Problem, Stable/Improving (1), Review of Psycho-Social Stressors (1), Review or order clinical lab tests (1) and Review of Medication Regimen & Side Effects (2)     COBOS, FERNANDO 01/20/2015, 5:21 PM

## 2015-01-20 NOTE — [facility] Group Notes (Signed)
[facility] LCSW Group Therapy 01/20/2015 1:15 PM Type of Therapy: Group Therapy Participation Level: Minimal  Participation Quality: Minimal  Affect: Lethargic  Cognitive: Alert and Oriented  Insight: Developing/Improving and Engaged  Engagement in Therapy: Developing/Improving and Engaged  Modes of Intervention: Activity, Clarification, Confrontation, Discussion, Education, Exploration, Limit-setting, Orientation, Problem-solving, Rapport Building, Dance movement psychotherapist, Socialization and Support  Summary of Progress/Problems: Patient was attentive and engaged with speaker from [employer]. Patient was attentive to speaker while they shared their story of dealing with mental health and overcoming it. Patient expressed interest in their programs and services and received information on their agency. Patient processed ways they can relate to the speaker. Patient was observed sleeping through part of the presentation.  [name], MSW, [employer] Clinical Social Worker [facility] [phone]

## 2015-01-20 NOTE — Progress Notes (Signed)
D: Pt's mood remains flat and depressed. She denies SI/HI/AVH. Minimally interacts but did attend group tonight.  A: Support given. Verbalization encouraged. Pt encouraged to come to staff for any concerns. Medications given as prescribed. R: Pt is receptive. No complaints of pain or discomfort at this time. Q15 min safety checks maintained. Pt remains safe on the unit. Will continue to monitor.

## 2015-01-20 NOTE — Progress Notes (Signed)
Adult Psychoeducational Group Note  Date:  01/20/2015 Time:  09:10  Group Topic/Focus:  Self Care:   The focus of this group is to help patients understand the importance of self-care in order to improve or restore emotional, physical, spiritual, interpersonal, and financial health.  Participation Level:  Minimal  Participation Quality:  Drowsy  Affect: Flat  Cognitive:  Appropriate and Oriented  Insight: Improving  Engagement in Group:  Engaged and Improving  Modes of Intervention:  Activity, Education, Orientation and Support  Additional Comments:  Pt not able to identify a healthy coping skill today. Pt was able to identify one daily goal, "to stay awake during groups."  Aurora Mask 01/20/2015, 5:42 PM

## 2015-01-21 LAB — GLUCOSE, CAPILLARY
GLUCOSE-CAPILLARY: 144 mg/dL — AB (ref 65–99)
Glucose-Capillary: 92 mg/dL (ref 65–99)

## 2015-01-21 MED ORDER — OMEPRAZOLE 40 MG PO CPDR: 40.0000 mg | DELAYED_RELEASE_CAPSULE | Freq: Every day | ORAL | Status: DC

## 2015-01-21 MED ORDER — DULOXETINE HCL 60 MG PO CPEP: 60.0000 mg | ORAL_CAPSULE | Freq: Every day | ORAL | Status: DC

## 2015-01-21 MED ORDER — HYDROXYZINE HCL 25 MG PO TABS: 25.0000 mg | ORAL_TABLET | Freq: Three times a day (TID) | ORAL | Status: DC | PRN

## 2015-01-21 MED ORDER — TRAZODONE HCL 50 MG PO TABS: 50.0000 mg | ORAL_TABLET | Freq: Every evening | ORAL | Status: DC | PRN

## 2015-01-21 MED ORDER — ZIPRASIDONE HCL 80 MG PO CAPS: 80.0000 mg | ORAL_CAPSULE | Freq: Two times a day (BID) | ORAL | Status: DC

## 2015-01-21 MED ORDER — ALBUTEROL SULFATE HFA 108 (90 BASE) MCG/ACT IN AERS: 2.0000 | INHALATION_SPRAY | Freq: Three times a day (TID) | RESPIRATORY_TRACT | Status: DC

## 2015-01-21 MED ORDER — GABAPENTIN 300 MG PO CAPS: 900.0000 mg | ORAL_CAPSULE | Freq: Three times a day (TID) | ORAL | Status: DC

## 2015-01-21 MED ORDER — DIVALPROEX SODIUM 500 MG PO DR TAB: 500.0000 mg | DELAYED_RELEASE_TABLET | Freq: Two times a day (BID) | ORAL | Status: DC

## 2015-01-21 NOTE — CIRT (Deleted)
Interdisciplinary Treatment Plan Update (Adult)  Date:  01/21/2015  Time Reviewed:  8:53 AM   Progress in Treatment: Attending groups: Patient is attending groups. Participating in groups:  Patient engages in discussion Taking medication as prescribed:  Patient is taking medications Tolerating medication:  Patient is tolerating medications Family/Significant othe contact made:   Yes, collateral contact with family. Patient understands diagnosis:Yes, patient understands diagnosis and need for treatment Discussing patient identified problems/goals with staff:  Yes, patient is able to express goals/problems Medical problems stabilized or resolved:  Yes Denies suicidal/homicidal ideation: Yes, patient is denying SI/HI. Issues/concerns per patient self-inventory:   Other:  Discharge Plan or Barriers:  ECT at [facility]  Reason for Continuation of Hospitalization: Anxiety Depression Medication stabilization   Comments:  Continue medication stabilization.  Patient has been referred for ECT at [facility] and awaiting a bed available.    Additional comments:  Patient and CSW reviewed Patient Discharge Process Letter/Patient Involvement Form.  Patient verbalized understanding and signed form.  Patient and CSW also reviewed and identified patient's goals and treatment plan.  Patient verbalized understanding and agreed to plan.  Estimated length of stay:  3-4 days  New goal(s):  Review of initial/current patient goals per problem list:  Please see plan of careInterdisciplinary Treatment Plan Update (Adult)  Attendees: Patient 01/21/2015 8:53 AM   Family:   01/21/2015 8:53 AM   Physician:  Nehemiah Massed, MD 01/21/2015 8:53 AM   Nursing:    01/21/2015 8:53 AM   Clinical Social Worker:  Juline Patch, LCSW 01/21/2015 8:53 AM   Clinical Social Worker:  Belenda Cruise Drinkard, LCSW-A 01/21/2015 8:53 AM   Case Manager:  Onnie Boer, RN 01/21/2015 8:53 AM   Other:   01/21/2015 8:53 AM  Other:    01/21/2015  8:53 AM   Other:  01/21/2015 8:53 AM   Other:  01/21/2015 8:53 AM   Other:  01/21/2015 8:53 AM   Other:  Chad Cordial Transition Team Coordinator 01/21/2015 8:53 AM   Other:   01/21/2015 8:53 AM   Other:  01/21/2015 8:53 AM   Other:   01/21/2015 8:53 AM    Scribe for Treatment Team:   Wynn Banker, 01/21/2015   8:53 AM

## 2015-01-21 NOTE — [facility] Suicide Risk Assessment (Signed)
[facility] Discharge Suicide Risk Assessment   Demographic Factors:  49 year old  Married female  Total Time spent with patient: 30 minutes  Musculoskeletal: Strength & Muscle Tone: within normal limits Gait & Station: normal Patient leans: N/A  Psychiatric Specialty Exam: Physical Exam  ROS  Blood pressure 130/85, pulse 95, temperature 97.8 F (36.6 C), temperature source Oral, resp. rate 18, height 5' 8.5" (1.74 m), weight 326 lb (147.873 kg), last menstrual period 12/09/2014, SpO2 100 %.Body mass index is 48.84 kg/(m^2).  General Appearance: Fairly Groomed  Patent attorney::  Good  Speech:  Normal Rate409  Volume:  Decreased  Mood:  improved partially, still depressed, but states she feels better   Affect:  less constricted, slightly more reactive, does smile at times appropriately  Thought Process:  Linear  Orientation:  Full (Time, Place, and Person)  Thought Content:  denies hallucinations, no delusions, not internally preoccupied   Suicidal Thoughts:  No at this time denies any thoughts of hurting self or of suicide. Also denies any homicidal ideations, and specifically denies any thoughts of hurting her husband   Homicidal Thoughts:  No  Memory:  recent and remote grossly intact   Judgement:  Other:  improving   Insight:  improving   Psychomotor Activity:  still decreased   Concentration:  Good  Recall:  NA  Fund of Knowledge:Good  Language: Good  Akathisia:  Negative  Handed:  Right  AIMS (if indicated):     Assets:  Desire for Improvement Housing Resilience Social Support  Reports her children are very supportive  Sleep:  Number of Hours: 6.75  Cognition: WNL  ADL's: fair    Have you used any form of tobacco in the last 30 days? (Cigarettes, Smokeless Tobacco, Cigars, and/or Pipes): No  Has this patient used any form of tobacco in the last 30 days? (Cigarettes, Smokeless Tobacco, Cigars, and/or Pipes) No  Mental Status Per Nursing Assessment::   On Admission:   Suicidal ideation indicated by patient  Current Mental Status by Physician: Patient has improved partially- she remains depressed, but affect is improving , and she is less flat, less blunted than before. She is still presenting with some psychomotor retardation, but to a lesser degree and today speech is improved. She is not thought disordered, she describes her mood as improved and scores it as a 5/10. She is denying any suicidal or self injurious ideations,  And is denying any thoughts of hurting or killing anyone , specifically denies any thoughts of hurting husband. She denies hallucinations and does not appear internally preoccupied , she is alert and attentive . Loss Factors: Marital separation  Historical Factors: History of Bipolar Disorder, History of Panic Attacks, history of Fibromyalgia, denies history of drug/alcohol abuse- no history of violence   Risk Reduction Factors:   Sense of responsibility to family, Living with another person, especially a relative and Positive social support  Continued Clinical Symptoms:  As noted above, patient has had severe depressive symptoms, which are gradually improving.she does remain depressed, but feels it is abating gradually, her  affect is  Still constricted, but more reactive. She is not suicidal and not homicidal and not psychotic .  Today states she is missing her children, missing her home and since bed at [facility] not yet available ( plan was to transfer to [place]  For inpatient ECT , pending bed availability there ) , she states she has decided she would prefer to go home  To be with her family.  As noted, states her children have been supportive . States she would consider outpatient ECT if a possibility/viable option, but at this time she does feel medications  " are helping a little ".   Cognitive Features That Contribute To Risk:  No gross cognitive deficits noted upon discharge. Is alert , attentive, and oriented x 3    Suicide  Risk:  Moderate:  Frequent suicidal ideation with limited intensity, and duration, some specificity in terms of plans, no associated intent, good self-control, limited dysphoria/symptomatology, some risk factors present, and identifiable protective factors, including available and accessible social support.  Principal Problem: Bipolar I disorder, most recent episode (or current) unspecified Discharge Diagnoses:  Patient Active Problem List   Diagnosis Date Noted  . Bipolar I disorder, most recent episode (or current) unspecified [F31.9] 01/09/2015  . Panic attacks [F41.0] 01/09/2015  . Headache(784.0) [R51] 07/10/2013  . Depressive disorder [F32.9] 03/08/2013  . Aphthous ulcer [K12.0] 03/01/2013  . Cellulitis [L03.90] 03/01/2013  . Chest pain [R07.9] 02/27/2013  . Iron deficiency anemia, unspecified [D50.9] 02/24/2013  . Pernicious anemia [D51.0] 02/24/2013  . Abnormality of gait [R26.9] 12/09/2012  . Dysphagia, unspecified(787.20) [R13.10] 12/08/2012  . DUB (dysfunctional uterine bleeding) [N93.8] 05/30/2011  . Anemia [D64.9] 05/30/2011  . COUGH [R05] 03/29/2010  . SKIN RASH [R21] 09/30/2009  . ACUTE BRONCHITIS [J20.9] 08/10/2009  . ALCOHOLISM [F10.20] 08/05/2009  . DIARRHEA [R19.7] 08/05/2009  . SINUSITIS, ACUTE [J01.90] 07/06/2009  . CELIAC SPRUE [K90.0] 07/06/2009  . 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 THYROID NODULE [E04.1] 03/04/2009  . Allergic rhinitis, cause unspecified [J30.9] 01/27/2009  . OBSTRUCTIVE SLEEP APNEA [G47.30] 01/26/2009  . SHORTNESS OF BREATH (SOB) [R06.02] 12/22/2008  . FIBROMYALGIA [M79.1, M60.9] 12/13/2008    Follow-up Information    Follow up with [facility].   Contact information:   [address] [place] [place]  [place], [place] [address] [phone]      Plan Of Care/Follow-up recommendations:  Activity:  as tolerated Diet:  heart healthy Tests:  NA Other:  See below  Is patient on multiple antipsychotic therapies at  discharge:  No   Has Patient had three or more failed trials of antipsychotic monotherapy by history:  No  Recommended Plan for Multiple Antipsychotic Therapies: NA   Patient interested in being discharged and no current grounds for involuntary commitment . She does agree to return to ED if any worsening depression or emergence of SI. She plans to  Return home with her adult children for support.   COBOS, FERNANDO 01/21/2015, 11:26 AM

## 2015-01-21 NOTE — [facility] Group Notes (Signed)
[facility] LCSW Aftercare Discharge Planning Group Note   01/21/2015 9:31 AM    Participation Quality:  Appropraite  Mood/Affect:  Appropriate  Depression Rating:  5  Anxiety Rating:  5  Thoughts of Suicide:  No  Will you contract for safety?   NA  Current AVH:  No  Plan for Discharge/Comments:  Patient attended discharge planning group and actively participated in group. She advised she and her CSW have a discharge plan in place.  Suicide prevention education reviewed and SPE document provided.   Transportation Means: Patient has transportation.   Supports:  Patient has a support system.   Leroy Trim, Joesph July

## 2015-01-21 NOTE — Progress Notes (Signed)
D: Patient is alert and oriented. Pt's mood and affect is depressed and blunted. Pt denies SI/HI and AVH. Pt reports blurred vision this morning, pt states "I always seem to have this problem when I'm on certain medications." Pt is attending unit groups. A: Active listening by RN. Encouragement/Support provided to pt. MD Cobos made aware of pt's vision concerns. Medication education reviewed with pt. Scheduled medications administered per providers orders (See MAR). 15 minute checks continued per protocol for patient safety.  R: Patient cooperative and receptive to nursing interventions. Pt remains safe.

## 2015-01-21 NOTE — Progress Notes (Signed)
Recreation Therapy Notes  Date: 06.24.16 Time: 9:30 am Location: 300 Hall Group Room  Group Topic: Stress Management  Goal Area(s) Addresses:  Patient will verbalize importance of using healthy stress management.  Patient will identify positive emotions associated with healthy stress management.   Intervention: Stress Management  Activity :  Progressive Muscle Relaxation.  LRT introduced and educated patients on the stress management technique of progressive muscle relaxation.  [name] script was used to deliver the technique to patients.  Patients were asked to follow the script read aloud by LRT to engage in practicing the stress management techniques.  Education:  Stress Management, Discharge Planning.   Education Outcome: Acknowledges edcuation/In group clarification offered/Needs additional education  Clinical Observations/Feedback: Patient did not attend group.   Stacey [name], LRT/CTRS         Stacey [name] 01/21/2015 1:21 PM 
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
## 2015-01-21 NOTE — Progress Notes (Signed)
D: Pt presents flat, depressed, and withdrawn. Pt remained in her room this evening (other than receiving her medications). Pt denied any SI/HI/AVH. Pt had unrelieved back pain with the use of Ibuprofen. Pt was asleep after one hour of receiving Robaxin for her back spasms.  A: Writer administered scheduled and prn medications to pt, per MD orders. Cpap machine in use. Continued support and availability as needed was extended to this pt. Staff continue to monitor pt with q97min checks.  R: No adverse drug reactions noted. Pt receptive to treatment. Pt contracts for safety. Pt remains safe at this time.

## 2015-01-21 NOTE — Progress Notes (Signed)
DISCHARGE NOTE: D: Patient was alert, oriented, in stable condition, and ambulatory with a steady gait upon discharge. Pt denies SI/HI and AVH. A: AVS reviewed and given to pt. Follow up reviewed with pt. Resources reviewed with pt, including NAMI. Prescriptions/Medications given to pt. Belongings returned to pt. Pt given time to ask questions and express concerns. Pt encouraged to follow up with PCP for any additional medical concerns. R: Pt D/C'd to "son."

## 2015-01-21 NOTE — Clinical Social Work Note (Signed)
CSW spoke with Jerilynn Som at [facility]- unsure of bed availability at this time for transfer but will call CSW when bed becomes available.  [name], MSW, [employer] Clinical Social Worker [facility] [phone]

## 2015-01-21 NOTE — Progress Notes (Signed)
  [facility] Adult Case Management Discharge Plan :  Will you be returning to the same living situation after discharge:  Yes,  Patient plans to return home at discharge At discharge, do you have transportation home?: Yes,  Patient reports access to transportation Do you have the ability to pay for your medications: Yes,  patient will be provided with medication samples and prescriptions at discharge  Release of information consent forms completed and in the chart;  Patient's signature needed at discharge.  Patient to Follow up at: Follow-up Information    Follow up with [facility].   Why:  Therapy appointment on Tuesday June 28th at 4pm with Melissa. Medication appointment on Monday July 11th at 12:30 with Dr. Lajean Silvius. Please call office if you need to reschedule appointments.    Contact information:   [address] [place]  [place], [place] [address] [phone]      Follow up with [facility] On 01/28/2015.   Why:  Consultation with Dr.Claypax on Friday July 1st at 1:20 pm regarding outpatient ECT treatment. Please call office if you need to reschedule appointment.    Contact information:   [address] [phone]       Patient denies SI/HI: Yes,  denies    Safety Planning and Suicide Prevention discussed: Yes,  with patient and son  Have you used any form of tobacco in the last 30 days? (Cigarettes, Smokeless Tobacco, Cigars, and/or Pipes): No  Has patient been referred to the Quitline?: N/A patient is not a smoker  Kemo Spruce, Belenda Cruise L 01/21/2015, 1:05 PM

## 2015-01-21 NOTE — Tx Team (Signed)
Interdisciplinary Treatment Plan Update (Adult)  Date: 01/21/2015  Time Reviewed: 8:53 AM   Progress in Treatment: Attending groups: Patient is attending groups. Participating in groups: Patient engages in discussion Taking medication as prescribed: Patient is taking medications Tolerating medication: Patient is tolerating medications Family/Significant othe contact made: Yes, collateral contact with family. Patient understands diagnosis:Yes, patient understands diagnosis and need for treatment Discussing patient identified problems/goals with staff: Yes, patient is able to express goals/problems Medical problems stabilized or resolved: Yes Denies suicidal/homicidal ideation: Yes, patient is denying SI/HI. Issues/concerns per patient self-inventory:  Other:  Discharge Plan or Barriers: ECT at [facility]  Reason for Continuation of Hospitalization: Anxiety Depression Medication stabilization   Comments: Continue medication stabilization. Patient has been referred for ECT at [facility] and awaiting a bed available.   Additional comments: Patient and CSW reviewed Patient Discharge Process Letter/Patient Involvement Form. Patient verbalized understanding and signed form. Patient and CSW also reviewed and identified patient's goals and treatment plan. Patient verbalized understanding and agreed to plan.  Estimated length of stay: 3-4 days  New goal(s):  Review of initial/current patient goals per problem list: Please see plan of careInterdisciplinary Treatment Plan Update (Adult)  Attendees: Patient 01/21/2015 8:53 AM   Family:  01/21/2015 8:53 AM   Physician: Nehemiah Massed, MD 01/21/2015 8:53 AM   Nursing: Lendell Caprice, RN 01/21/2015 8:53 AM   Clinical Social Worker: Juline Patch, LCSW 01/21/2015 8:53 AM   Clinical Social Worker: [name], LCSW-A 01/21/2015 8:53 AM   Case Manager: Onnie Boer, RN 01/21/2015 8:53 AM    Other: Rayetta Humphrey, RN 01/21/2015 8:53 AM  Other:  01/21/2015 8:53 AM   Other:  01/21/2015 8:53 AM   Other:  01/21/2015 8:53 AM   Other:  01/21/2015 8:53 AM   Other: Chad Cordial Transition Team Coordinator 01/21/2015 8:53 AM   Other:  01/21/2015 8:53 AM   Other:  01/21/2015 8:53 AM   Other:  01/21/2015 8:53 AM    Scribe for Treatment Team:  Wynn Banker, 01/21/2015 8:53 AM

## 2015-01-28 ENCOUNTER — Ambulatory Visit (INDEPENDENT_AMBULATORY_CARE_PROVIDER_SITE_OTHER): Payer: Medicaid Other | Admitting: Psychiatry

## 2015-01-28 ENCOUNTER — Encounter: Payer: Self-pay | Admitting: Psychiatry

## 2015-01-28 VITALS — BP 138/88 | HR 98 | Temp 97.8°F | Ht 69.0 in | Wt 315.2 lb

## 2015-01-28 DIAGNOSIS — F332 Major depressive disorder, recurrent severe without psychotic features: Secondary | ICD-10-CM | POA: Diagnosis not present

## 2015-01-28 NOTE — Progress Notes (Signed)
Psychiatric Initial Adult Assessment   Patient Identification: DENNYS TRAUGHBER MRN:  [id number] Date of Evaluation:  01/28/2015 Referral Source: Dr Gwyndolyn Kaufman Chief Complaint:   Visit Diagnosis: No diagnosis found. Diagnosis:   Major depression severe recurrent Patient Active Problem List   Diagnosis Date Noted  . Bipolar I disorder, most recent episode (or current) unspecified [F31.9] 01/09/2015  . Panic attacks [F41.0] 01/09/2015  . Headache(784.0) [R51] 07/10/2013  . Depressive disorder [F32.9] 03/08/2013  . Aphthous ulcer [K12.0] 03/01/2013  . Cellulitis [L03.90] 03/01/2013  . Chest pain [R07.9] 02/27/2013  . Iron deficiency anemia, unspecified [D50.9] 02/24/2013  . Pernicious anemia [D51.0] 02/24/2013  . Abnormality of gait [R26.9] 12/09/2012  . Dysphagia, unspecified(787.20) [R13.10] 12/08/2012  . DUB (dysfunctional uterine bleeding) [N93.8] 05/30/2011  . Anemia [D64.9] 05/30/2011  . COUGH [R05] 03/29/2010  . SKIN RASH [R21] 09/30/2009  . ACUTE BRONCHITIS [J20.9] 08/10/2009  . ALCOHOLISM [F10.20] 08/05/2009  . DIARRHEA [R19.7] 08/05/2009  . SINUSITIS, ACUTE [J01.90] 07/06/2009  . CELIAC SPRUE [K90.0] 07/06/2009  . 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 THYROID NODULE [E04.1] 03/04/2009  . Allergic rhinitis, cause unspecified [J30.9] 01/27/2009  . OBSTRUCTIVE SLEEP APNEA [G47.30] 01/26/2009  . SHORTNESS OF BREATH (SOB) [R06.02] 12/22/2008  . FIBROMYALGIA [M79.1, M60.9] 12/13/2008   History of Present Illness:  Information obtained from the patient and the chart. This is a ECT consult intake. 49 year old woman with a long-standing history of depressive symptoms is referred by her outpatient psychiatrist for consideration of ECT. Current symptoms include depressed mood present most of the time, decreased interest in usual activities negative thinking passive suicidal thoughts without intent or plan. Patient was recently discharged from an inpatient stay at [facility]  behavioral health. She had been admitted because of suicidal ideation although she had not acted on it. Patient has been compliant with current medication which includes citalopram and Geodon as well as having restarted clonazepam. She was offered the option of ECT while she was inpatient but was ready for discharge before we could arrange transfer.  Past psychiatric history: Long-standing depression. Several hospitalizations most of them as a teenager. Her recent hospitalization at [facility] was her first one as an adult. Long history of suicidal thoughts without attempts. Occasional hallucinations but not very common. Usually happen in the context of stress. Multiple medications have been tried. Geodon has been helpful especially when combined with antidepressives. Lithium caused sickness and was not tolerated. Seroquel Wellbutrin and Prozac have been tried without benefit. Patient thinks several other medicines have been tried as well. The diagnosis of bipolar disorder has been considered as well although she does not give a history of a manic episode.  Medical history: Chronic pain from fibromyalgia and arthritis. A history of anemia. Overweight. No history of cardiac or pulmonary disease no history of myocardial infarction.  Social history: Patient has 6 children total 4 of whom are living with her including a 72 year old son and an 70 year old son as well as a 34 year old son and a 38 year old daughter. She is not married. Patient is disabled and not able to work.  Substance abuse history: Very rarely drinks alcohol no history of drugs denies any history of alcohol or drug abuse. Elements:  Quality:  Depressed mood which she currently rates as about a 7 out of 10.. Severity:  Severe in terms of disabling her lifestyle and causing distress. Timing:  Recent worsening without a clearly specific stressor. Duration:  She estimates that she is been depressed since she was 49 years old.  Context:  Ongoing  depression social stress. Associated Signs/Symptoms: Depression Symptoms:  depressed mood, anhedonia, psychomotor retardation, fatigue, feelings of worthlessness/guilt, difficulty concentrating, recurrent thoughts of death, suicidal thoughts without plan, (Hypo) Manic Symptoms:  None Anxiety Symptoms:  Excessive Worry, Psychotic Symptoms:  Hallucinations: Auditory PTSD Symptoms: Negative  Past Medical History:  Past Medical History  Diagnosis Date  . Asthma   . Allergic rhinitis, cause unspecified   . Bipolar 1 disorder   . Morbid obesity   . Thyroid nodule   . Mental disorder   . 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 Pt has never been tested.   . Anxiety   . OSA (obstructive sleep apnea)     uses cpap does not know settings  . Complication of anesthesia 2004    bp dropped after c section, took 24hrs to feels leg , numbing medicine does not last long when injected into skin  . Difficult intravenous access     hard stick for ivs and lab per pt  . Nasal bone fracture age 8    left side cannot place tube in nostril per pt  . Iron deficiency anemia, unspecified 02/24/2013  . Pernicious anemia 02/24/2013  . Anxiety   . Fibromyalgia     Past Surgical History  Procedure Laterality Date  . Tubal ligation    . Cesarean section    . Noraplant    . Wisdom tooth extraction    . Esophagogastroduodenoscopy N/A 01/02/2013    Procedure: ESOPHAGOGASTRODUODENOSCOPY (EGD);  Surgeon: Louis Meckel, MD;  Location: [facility];  Service: Endoscopy;  Laterality: N/A;  . Colonoscopy with propofol N/A 01/02/2013    Procedure: COLONOSCOPY WITH PROPOFOL;  Surgeon: Louis Meckel, MD;  Location: [facility];  Service: Endoscopy;  Laterality: N/A;   Family History:  Family History  Problem Relation Age of Onset  . Breast cancer Mother   . Asthma Mother   . Bipolar disorder Mother   . Parkinson's disease Mother   . Emphysema Paternal Grandmother   . Diabetes Father    Social  History:   History   Social History  . Marital Status: Married    Spouse Name: [place]  . Number of Children: 4  . Years of Education: Bachelors   Occupational History  . Unemployed    Social History Main Topics  . Smoking status: Former Smoker -- 0.50 packs/day for 20 years    Types: Cigarettes    Start date: 02/05/1987    Quit date: 07/30/2006  . Smokeless tobacco: Never Used     Comment: quit 7 years ago  . Alcohol Use: Yes     Comment: socially  . Drug Use: No  . Sexual Activity: No   Other Topics Concern  . None   Social History Narrative   [facility] 4 children 21, 15, 59, 10, she stays at home.   Patient has a Bachelor's degree.   Patient is right- handed.   Patient drinks one cup of coffee in the morning, sometimes 2 cups.         Additional Social History: As noted above she is not married and is on disability. She is concerned that she will not be able to find transportation for ECT  Musculoskeletal: Strength & Muscle Tone: within normal limits Gait & Station: normal Patient leans: N/A  Psychiatric Specialty Exam: HPI  ROS  Blood pressure 138/88, pulse 98, temperature 97.8 F (36.6 C), temperature source Tympanic, height 5\' 9"  (  1.753 m), weight 315 lb 3.2 oz (142.974 kg), last menstrual period 12/09/2014, SpO2 93 %.Body mass index is 46.53 kg/(m^2).  General Appearance: Casual  Eye Contact:  Good  Speech:  Normal Rate  Volume:  Normal  Mood:  Depressed  Affect:  Depressed  Thought Process:  Coherent and Goal Directed  Orientation:  Full (Time, Place, and Person)  Thought Content:  Negative  Suicidal Thoughts:  Yes.  without intent/plan  Homicidal Thoughts:  No  Memory:  Immediate;   Good Recent;   Good Remote;   Good  Judgement:  Intact  Insight:  Present  Psychomotor Activity:  Normal  Concentration:  Fair  Recall:  Fair  Fund of Knowledge:Good  Language: Good  Akathisia:  No  Handed:  Right  AIMS (if indicated):      Assets:  Communication Skills Desire for Improvement Housing Physical Health Resilience Social Support  ADL's:  Intact  Cognition: WNL  Sleep:  normal   Is the patient at risk to self?  No.   Has the patient been a risk to self in the past 6 months?  Yes.   Has the patient been a risk to self within the distant past?  Yes.   Is the patient a risk to others?  No. Has the patient been a risk to others in the past 6 months?  No. Has the patient been a risk to others within the distant past?  No.  Allergies:   Allergies  Allergen Reactions  . Bee Venom   . Bupropion Hcl Other (See Comments)    swelling in hands  . Capzasin [Capsaicin] Other (See Comments)    Red and burning  . Citalopram Hydrobromide Other (See Comments)    Doesn't remember reaction  . Other Other (See Comments)    Bleach cause vocal cord to close  . Strawberry Other (See Comments)    Caused facial swelling and rash  . Lamictal [Lamotrigine] Rash  . Latex Other (See Comments)    Blisters where the latex touched it   Current Medications: Current Outpatient Prescriptions  Medication Sig Dispense Refill  . albuterol (PROVENTIL HFA;VENTOLIN HFA) 108 (90 BASE) MCG/ACT inhaler Inhale 2 puffs into the lungs 3 (three) times daily.    . clonazePAM (KLONOPIN) 1 MG tablet Take 1 mg by mouth 2 (two) times daily.    . DULoxetine (CYMBALTA) 60 MG capsule Take 1 capsule (60 mg total) by mouth daily. 30 capsule 0  . gabapentin (NEURONTIN) 300 MG capsule Take 3 capsules (900 mg total) by mouth 3 (three) times daily. 270 capsule 0  . omeprazole (PRILOSEC) 40 MG capsule Take 1 capsule (40 mg total) by mouth daily. 30 capsule 0  . rizatriptan (MAXALT) 10 MG tablet Take 10 mg by mouth as needed for migraine. May repeat in 2 hours if needed    . ziprasidone (GEODON) 80 MG capsule Take 1 capsule (80 mg total) by mouth 2 (two) times daily with a meal. 60 capsule 0  . divalproex (DEPAKOTE) 500 MG DR tablet Take 1 tablet (500 mg  total) by mouth 2 (two) times daily. (Patient not taking: Reported on 01/28/2015) 60 tablet 0  . hydrOXYzine (ATARAX/VISTARIL) 25 MG tablet Take 1 tablet (25 mg total) by mouth 3 (three) times daily as needed for anxiety. (Patient not taking: Reported on 01/28/2015) 90 tablet 0  . traZODone (DESYREL) 50 MG tablet Take 1 tablet (50 mg total) by mouth at bedtime as needed for sleep. (Patient not taking: Reported  on 01/28/2015) 30 tablet 0   No current facility-administered medications for this visit.    Previous Psychotropic Medications: Yes   Substance Abuse History in the last 12 months:  No.  Consequences of Substance Abuse: Negative  Medical Decision Making:  Review of Psycho-Social Stressors (1), Established Problem, Worsening (2), Review of Medication Regimen & Side Effects (2) and Review of New Medication or Change in Dosage (2)  Treatment Plan Summary: Plan Patient appears to be a good candidate for ECT. She has either recurrent severe major depression or bipolar disorder with prominent recurrent depressive symptoms either one of which is a appropriate indication. She has had multiple medication trials without full response. She has very significant disability from her illness. She has no significant medical contraindications. And has the capacity to make reasonable medical decisions. Patient was educated about ECT in general and the program here at our hospital. We discussed the practicalities of an ECT treatment plan. Patient was given instructions to get the workup done at our lab. I will pass her name along to the ECT service. We discussed timing. Because of our current nursing shortage it would be better to delay starting treatment until at least the end of this month. Patient is agreeable to that. She will call back when she has arranged transportation and made arrangements to get her lab tests done. She was given opportunity to ask questions and they were answered regarding ECT. She has [place] [facility]  numbers so she can get in touch if needed for further questions.    [name] 7/1/20169:12 PM

## 2015-02-17 ENCOUNTER — Encounter
Admission: RE | Admit: 2015-02-17 | Discharge: 2015-02-17 | Disposition: A | Discharge status: Home / Self Care | Payer: Medicaid Other | Source: Ambulatory Visit | Attending: Psychiatry | Admitting: Psychiatry

## 2015-02-17 ENCOUNTER — Ambulatory Visit
Admission: RE | Admit: 2015-02-17 | Discharge: 2015-02-17 | Disposition: A | Discharge status: Home / Self Care | Payer: Medicaid Other | Source: Ambulatory Visit | Attending: Psychiatry | Admitting: Psychiatry

## 2015-02-17 DIAGNOSIS — G4730 Sleep apnea, unspecified: Secondary | ICD-10-CM | POA: Diagnosis not present

## 2015-02-17 DIAGNOSIS — Z825 Family history of asthma and other chronic lower respiratory diseases: Secondary | ICD-10-CM | POA: Insufficient documentation

## 2015-02-17 DIAGNOSIS — F1020 Alcohol dependence, uncomplicated: Secondary | ICD-10-CM | POA: Insufficient documentation

## 2015-02-17 DIAGNOSIS — Z91040 Latex allergy status: Secondary | ICD-10-CM | POA: Diagnosis not present

## 2015-02-17 DIAGNOSIS — Z01818 Encounter for other preprocedural examination: Secondary | ICD-10-CM | POA: Diagnosis present

## 2015-02-17 DIAGNOSIS — Z833 Family history of diabetes mellitus: Secondary | ICD-10-CM | POA: Diagnosis not present

## 2015-02-17 DIAGNOSIS — Z01812 Encounter for preprocedural laboratory examination: Secondary | ICD-10-CM | POA: Diagnosis present

## 2015-02-17 DIAGNOSIS — Z9109 Other allergy status, other than to drugs and biological substances: Secondary | ICD-10-CM | POA: Diagnosis not present

## 2015-02-17 DIAGNOSIS — J019 Acute sinusitis, unspecified: Secondary | ICD-10-CM | POA: Diagnosis not present

## 2015-02-17 DIAGNOSIS — R21 Rash and other nonspecific skin eruption: Secondary | ICD-10-CM | POA: Diagnosis not present

## 2015-02-17 DIAGNOSIS — F329 Major depressive disorder, single episode, unspecified: Secondary | ICD-10-CM | POA: Insufficient documentation

## 2015-02-17 DIAGNOSIS — Z01810 Encounter for preprocedural cardiovascular examination: Secondary | ICD-10-CM | POA: Diagnosis present

## 2015-02-17 DIAGNOSIS — Z820 Family history of epilepsy and other diseases of the nervous system: Secondary | ICD-10-CM | POA: Insufficient documentation

## 2015-02-17 DIAGNOSIS — F319 Bipolar disorder, unspecified: Secondary | ICD-10-CM | POA: Diagnosis not present

## 2015-02-17 DIAGNOSIS — I10 Essential (primary) hypertension: Secondary | ICD-10-CM | POA: Diagnosis not present

## 2015-02-17 DIAGNOSIS — R05 Cough: Secondary | ICD-10-CM | POA: Diagnosis not present

## 2015-02-17 DIAGNOSIS — Z91018 Allergy to other foods: Secondary | ICD-10-CM | POA: Insufficient documentation

## 2015-02-17 DIAGNOSIS — R197 Diarrhea, unspecified: Secondary | ICD-10-CM | POA: Diagnosis not present

## 2015-02-17 DIAGNOSIS — R0602 Shortness of breath: Secondary | ICD-10-CM | POA: Insufficient documentation

## 2015-02-17 DIAGNOSIS — Z803 Family history of malignant neoplasm of breast: Secondary | ICD-10-CM | POA: Diagnosis not present

## 2015-02-17 DIAGNOSIS — Z3200 Encounter for pregnancy test, result unknown: Secondary | ICD-10-CM | POA: Diagnosis not present

## 2015-02-17 DIAGNOSIS — E041 Nontoxic single thyroid nodule: Secondary | ICD-10-CM | POA: Insufficient documentation

## 2015-02-17 DIAGNOSIS — Z818 Family history of other mental and behavioral disorders: Secondary | ICD-10-CM | POA: Insufficient documentation

## 2015-02-17 HISTORY — DX: Headache, unspecified: R51.9

## 2015-02-17 HISTORY — DX: Gastro-esophageal reflux disease without esophagitis: K21.9

## 2015-02-17 HISTORY — DX: Family history of other specified conditions: Z84.89

## 2015-02-17 HISTORY — DX: Type 2 diabetes mellitus without complications: E11.9

## 2015-02-17 HISTORY — DX: Headache: R51

## 2015-02-17 LAB — BASIC METABOLIC PANEL
ANION GAP: 7 (ref 5–15)
BUN: 11 mg/dL (ref 6–20)
CALCIUM: 9.4 mg/dL (ref 8.9–10.3)
CHLORIDE: 102 mmol/L (ref 101–111)
CO2: 30 mmol/L (ref 22–32)
Creatinine, Ser: 0.7 mg/dL (ref 0.44–1.00)
GFR calc non Af Amer: >60 mL/min (ref >60)
Glucose, Bld: 99 mg/dL (ref 65–99)
Potassium: 4.1 mmol/L (ref 3.5–5.1)
Sodium: 139 mmol/L (ref 135–145)

## 2015-02-17 LAB — CBC
HCT: 38.1 % (ref 35.0–47.0)
Hemoglobin: 12.4 g/dL (ref 12.0–16.0)
MCH: 27.2 pg (ref 26.0–34.0)
MCHC: 32.5 g/dL (ref 32.0–36.0)
MCV: 83.6 fL (ref 80.0–100.0)
PLATELETS: 282 10*3/uL (ref 150–440)
RBC: 4.56 MIL/uL (ref 3.80–5.20)
RDW: 15.3 % — ABNORMAL HIGH (ref 11.5–14.5)
WBC: 10.1 10*3/uL (ref 3.6–11.0)

## 2015-02-17 LAB — PREGNANCY, URINE: Preg Test, Ur: NEGATIVE

## 2015-02-17 NOTE — Patient Instructions (Signed)
  Your procedure is scheduled on: Report to Day Surgery. [facility] SECOND FLOOR To find out your arrival time please call [phone] between 1PM - 3PM on DAY BEFORE PROCEDURE  Remember: Instructions that are not followed completely may result in serious medical risk, up to and including death, or upon the discretion of your surgeon and anesthesiologist your surgery may need to be rescheduled.    X___ 1. Do not eat food or drink liquids after midnight. No gum chewing or hard candies.     _X___ 2. No Alcohol for 24 hours before or after surgery.   ____ 3. Bring all medications with you on the day of surgery if instructed.    _X___ 4. Notify your doctor if there is any change in your medical condition     (cold, fever, infections).     Do not wear jewelry, make-up, hairpins, clips or nail polish.  Do not wear lotions, powders, or perfumes. You may wear deodorant.  Do not shave 48 hours prior to surgery. Men may shave face and neck.  Do not bring valuables to the hospital.    [facility] is not responsible for any belongings or valuables.               Contacts, dentures or bridgework may not be worn into surgery.  Leave your suitcase in the car. After surgery it may be brought to your room.  For patients admitted to the hospital, discharge time is determined by your                treatment team.   Patients discharged the day of surgery will not be allowed to drive home.   Please read over the following fact sheets that you were given:   Surgical Site Infection Prevention   __X__ Take these medicines the morning of surgery with A SIP OF WATER:    1.AS INSTRUCTED BY DR CLAPACS  2.   3.   4.  5.  6.  ____ Fleet Enema (as directed)   ____ Use CHG Soap as directed  ____ Use inhalers on the day of surgery  ____ Stop metformin 2 days prior to surgery    ____ Take 1/2 of usual insulin dose the night before surgery and none on the morning of surgery.   ____ Stop  Coumadin/Plavix/aspirin on ____ Stop Anti-inflammatories on    ____ Stop supplements until after surgery.    ____ Bring C-Pap to the hospital.

## 2015-03-16 ENCOUNTER — Encounter: Payer: Self-pay | Admitting: Anesthesiology

## 2015-03-16 ENCOUNTER — Other Ambulatory Visit: Payer: Self-pay

## 2015-03-16 ENCOUNTER — Encounter: Admission: RE | Admit: 2015-03-16 | Payer: Medicaid Other | Source: Ambulatory Visit | Admitting: Psychiatry

## 2015-03-16 ENCOUNTER — Encounter
Admission: RE | Admit: 2015-03-16 | Discharge: 2015-03-16 | Disposition: A | Discharge status: Home / Self Care | Payer: Medicaid Other | Source: Ambulatory Visit | Attending: Psychiatry | Admitting: Psychiatry

## 2015-03-16 DIAGNOSIS — F332 Major depressive disorder, recurrent severe without psychotic features: Secondary | ICD-10-CM | POA: Diagnosis not present

## 2015-03-16 MED ORDER — SUCCINYLCHOLINE CHLORIDE 20 MG/ML IJ SOLN
150.0000 mg | Freq: Once | INTRAMUSCULAR | Status: AC
  Administered 2015-03-16: 150 mg via INTRAVENOUS

## 2015-03-16 MED ORDER — LIDOCAINE HCL (CARDIAC) 20 MG/ML IV SOLN
4.0000 mg | Freq: Once | INTRAVENOUS | Status: AC
  Administered 2015-03-16: 4 mg via INTRAVENOUS

## 2015-03-16 MED ORDER — METHOHEXITAL SODIUM 100 MG/10ML IV SOSY
140.0000 mg | PREFILLED_SYRINGE | Freq: Once | INTRAVENOUS | Status: AC
  Administered 2015-03-16: 140 mg via INTRAVENOUS

## 2015-03-16 MED ORDER — DEXTROSE 5 % IV SOLN
250.0000 mL | Freq: Once | INTRAVENOUS | Status: AC
  Administered 2015-03-16: 250 mL via INTRAVENOUS

## 2015-03-16 NOTE — Anesthesia Preprocedure Evaluation (Addendum)
Anesthesia Evaluation  Patient identified by MRN, date of birth, ID band Patient awake    Reviewed: Allergy & Precautions, H&P , NPO status , Patient's Chart, lab work & pertinent test results  History of Anesthesia Complications (+) Family history of anesthesia reaction and history of anesthetic complications  Airway Mallampati: III  TM Distance: >3 FB Neck ROM: full    Dental  (+) Poor Dentition   Pulmonary shortness of breath and with exertion, asthma , sleep apnea and Continuous Positive Airway Pressure Ventilation , former smoker,  Vocal cord dysfucnction treated with neurontin. breath sounds clear to auscultation  Pulmonary exam normal       Cardiovascular Exercise Tolerance: Good Normal cardiovascular examRhythm:regular Rate:Normal     Neuro/Psych  Headaches, PSYCHIATRIC DISORDERS  Neuromuscular disease    GI/Hepatic negative GI ROS, Neg liver ROS, GERD-  Controlled,(+)     substance abuse  alcohol use, dysphagia   Endo/Other  diabetesMorbid obesity  Renal/GU negative Renal ROS     Musculoskeletal   Abdominal   Peds  Hematology negative hematology ROS (+)   Anesthesia Other Findings Past Medical History:   Asthma                                                       Allergic rhinitis, cause unspecified                         Bipolar 1 disorder                                           Morbid obesity                                               Thyroid nodule                                               Mental disorder                                              Beta thalassemia minor                          Pt has a *   Anxiety                                                      OSA (obstructive sleep apnea)                                  Comment:uses cpap does not know settings   Difficult  intravenous access                                   Comment:hard stick for ivs and lab per pt   Nasal  bone fracture                             age 49          Comment:left side cannot place tube in nostril per pt   Iron deficiency anemia, unspecified             02/24/2013    Pernicious anemia                               02/24/2013    Anxiety                                                      Complication of anesthesia                      2004           Comment:bp dropped after c section, took 24hrs to feels              leg , numbing medicine does not last long when               injected into skin   Family history of adverse reaction to anesthes*                Comment:mom heart stopped times 2 in or   Diabetes mellitus without complication                         Comment:pre diabetic   GERD (gastroesophageal reflux disease)                       Headache                                                     Fibromyalgia                                                 Reproductive/Obstetrics negative OB ROS                            Anesthesia Physical  Anesthesia Plan  ASA: III  Anesthesia Plan: General   Post-op Pain Management:    Induction:   Airway Management Planned: Simple Face Mask  Additional Equipment:   Intra-op Plan:   Post-operative Plan:   Informed Consent: I have reviewed the patients History and Physical, chart, labs and discussed the procedure including the risks, benefits and alternatives for the proposed anesthesia with the patient or authorized representative who has indicated his/her understanding and acceptance.  Dental Advisory Given  Plan Discussed with: CRNA and Surgeon  Anesthesia Plan Comments:         Anesthesia Quick Evaluation

## 2015-03-16 NOTE — Anesthesia Postprocedure Evaluation (Signed)
  Anesthesia Post-op Note  Patient: Stacey [name]  Procedure(s) Performed: * No procedures listed *  Anesthesia type:General  Patient location: PACU  Post pain: Pain level controlled  Post assessment: Post-op Vital signs reviewed, Patient's Cardiovascular Status Stable, Respiratory Function Stable, Patent Airway and No signs of Nausea or vomiting  Post vital signs: Reviewed and stable  Last Vitals:  Filed Vitals:   03/16/15 1301  BP: 130/80  Pulse: 89  Temp:   Resp: 18    Level of consciousness: awake, alert  and patient cooperative  Complications: No apparent anesthesia complications

## 2015-03-16 NOTE — Procedures (Signed)
ECT SERVICES Physician's Interval Evaluation & Treatment Note  Patient Identification: Stacey [name] MRN:  [id number] Date of Evaluation:  03/16/2015 TX #: 1  MADRS: 43 MMSE: 30  P.E. Findings:  Overweight but otherwise no findings no physical distress vital signs normal  Psychiatric Interval Note:  Mood is depressed. No active suicidal thought or plan  Subjective:  Patient is a 49 y.o. female seen for evaluation for Electroconvulsive Therapy. Patient with major depression recurrent severe resistant to medicine  Treatment Summary:     Right Unilateral              Bilateral   % Energy : 0.3 ms, 50%   Impedance: 1020 homes  Seizure Energy Index: 18,457 V squared  Postictal Suppression Index: 64%  Seizure Concordance Index: 93%  Medications  Pre Shock: Xylocaine 4 mg, Brevital 140 mg, succinylcholine 150 mg  Post Shock:    Seizure Duration: 23 seconds by EMG, 44 seconds by EEG   Comments: Patient to return Friday  and continue with index treatment for improvement in depression   Lungs:    Clear to auscultation                Other:   Heart:      Regular rhythm              irregular rhythm      Previous H&P reviewed, patient examined and there are NO CHANGES                   Previous H&P reviewed, patient examined and there are changes noted.   Mordecai Rasmussen, MD 8/17/201612:07 PM

## 2015-03-16 NOTE — Transfer of Care (Signed)
Immediate Anesthesia Transfer of Care Note  Patient: Stacey [name]  Procedure(s) Performed: ECT  Patient Location: PACU  Anesthesia Type:General  Level of Consciousness: sedated  Airway & Oxygen Therapy: Patient Spontanous Breathing and Patient connected to face mask oxygen  Post-op Assessment: Report given to RN  Post vital signs: Reviewed and stable  Last Vitals:  Filed Vitals:   03/16/15 1225  BP: 128/78  Pulse: 72  Temp: 37.8 C  Resp: 23    Complications: No apparent anesthesia complications

## 2015-03-16 NOTE — Anesthesia Procedure Notes (Signed)
Date/Time: 03/16/2015 12:14 PM Performed by: Lily Kocher Pre-anesthesia Checklist: Patient identified, Timeout performed, Emergency Drugs available, Suction available and Patient being monitored Patient Re-evaluated:Patient Re-evaluated prior to inductionOxygen Delivery Method: Circle system utilized Preoxygenation: Pre-oxygenation with 100% oxygen Intubation Type: IV induction Ventilation: Mask ventilation without difficulty and Mask ventilation throughout procedure Airway Equipment and Method: Bite block Placement Confirmation: positive ETCO2 Dental Injury: Teeth and Oropharynx as per pre-operative assessment

## 2015-03-18 ENCOUNTER — Encounter
Admission: RE | Admit: 2015-03-18 | Discharge: 2015-03-18 | Disposition: A | Discharge status: Home / Self Care | Payer: Medicaid Other | Source: Ambulatory Visit | Attending: Psychiatry | Admitting: Psychiatry

## 2015-03-18 ENCOUNTER — Encounter: Payer: Self-pay | Admitting: Anesthesiology

## 2015-03-18 ENCOUNTER — Other Ambulatory Visit: Payer: Self-pay

## 2015-03-18 DIAGNOSIS — F332 Major depressive disorder, recurrent severe without psychotic features: Secondary | ICD-10-CM

## 2015-03-18 MED ORDER — KETOROLAC TROMETHAMINE 30 MG/ML IJ SOLN
30.0000 mg | Freq: Once | INTRAMUSCULAR | Status: AC
  Administered 2015-03-18: 30 mg via INTRAVENOUS

## 2015-03-18 MED ORDER — LIDOCAINE HCL (CARDIAC) 20 MG/ML IV SOLN
4.0000 mg | Freq: Once | INTRAVENOUS | Status: AC
  Administered 2015-03-18: 4 mg via INTRAVENOUS

## 2015-03-18 MED ORDER — DEXTROSE 5 % IV SOLN
250.0000 mL | Freq: Once | INTRAVENOUS | Status: AC
  Administered 2015-03-18: 250 mL via INTRAVENOUS

## 2015-03-18 MED ORDER — SUCCINYLCHOLINE CHLORIDE 20 MG/ML IJ SOLN
150.0000 mg | Freq: Once | INTRAMUSCULAR | Status: AC
  Administered 2015-03-18: 150 mg via INTRAVENOUS

## 2015-03-18 MED ORDER — METHOHEXITAL SODIUM 100 MG/10ML IV SOSY
140.0000 mg | PREFILLED_SYRINGE | Freq: Once | INTRAVENOUS | Status: AC
  Administered 2015-03-18: 140 mg via INTRAVENOUS

## 2015-03-18 NOTE — Procedures (Signed)
ECT SERVICES Physician's Interval Evaluation & Treatment Note  Patient Identification: Stacey [name] MRN:  [id number] Date of Evaluation:  03/18/2015 TX #: 2  MADRS:   MMSE:   P.E. Findings:  No new physical findings or complaints except for mild headache  Psychiatric Interval Note:  Mood possibly subjectively a little improved  Subjective:  Patient is a 49 y.o. female seen for evaluation for Electroconvulsive Therapy. No other new complaints  Treatment Summary:     Right Unilateral              Bilateral   % Energy : 0.3 ms 75%   Impedance: 690 ohms  Seizure Energy Index: 14,934 V squared  Postictal Suppression Index: 69%  Seizure Concordance Index: 89%  Medications  Pre Shock: Xylocaine 4 mg, Brevital 140 mg, Toradol 30 mg, succinylcholine 150 mg  Post Shock: None  Seizure Duration: 13 seconds by EMG, 24 seconds by EEG   Comments: Next treatment Monday   Lungs:    Clear to auscultation                Other:   Heart:      Regular rhythm              irregular rhythm      Previous H&P reviewed, patient examined and there are NO CHANGES                   Previous H&P reviewed, patient examined and there are changes noted.   Mordecai Rasmussen, MD 8/19/201612:18 PM

## 2015-03-18 NOTE — Anesthesia Postprocedure Evaluation (Signed)
  Anesthesia Post-op Note  Patient: Stacey [name]  Procedure(s) Performed: * No procedures listed *  Anesthesia type:General  Patient location: PACU  Post pain: Pain level controlled  Post assessment: Post-op Vital signs reviewed, Patient's Cardiovascular Status Stable, Respiratory Function Stable, Patent Airway and No signs of Nausea or vomiting  Post vital signs: Reviewed and stable  Last Vitals:  Filed Vitals:   03/18/15 1300  BP: 119/72  Pulse: 70  Temp:   Resp: 18    Level of consciousness: awake, alert  and patient cooperative  Complications: No apparent anesthesia complications

## 2015-03-18 NOTE — Anesthesia Preprocedure Evaluation (Signed)
Anesthesia Evaluation  Patient identified by MRN, date of birth, ID band Patient awake    Reviewed: Allergy & Precautions, H&P , NPO status , Patient's Chart, lab work & pertinent test results  History of Anesthesia Complications (+) Family history of anesthesia reaction and history of anesthetic complications  Airway Mallampati: III  TM Distance: >3 FB Neck ROM: full    Dental  (+) Poor Dentition   Pulmonary shortness of breath and with exertion, asthma , sleep apnea and Continuous Positive Airway Pressure Ventilation , former smoker,  Vocal cord dysfucnction treated with neurontin. breath sounds clear to auscultation  Pulmonary exam normal       Cardiovascular Exercise Tolerance: Good Normal cardiovascular examRhythm:regular Rate:Normal     Neuro/Psych  Headaches, PSYCHIATRIC DISORDERS  Neuromuscular disease    GI/Hepatic negative GI ROS, Neg liver ROS, GERD-  Controlled,(+)     substance abuse  alcohol use, dysphagia   Endo/Other  diabetesMorbid obesity  Renal/GU negative Renal ROS     Musculoskeletal   Abdominal   Peds  Hematology negative hematology ROS (+)   Anesthesia Other Findings Past Medical History:   Asthma                                                       Allergic rhinitis, cause unspecified                         Bipolar 1 disorder                                           Morbid obesity                                               Thyroid nodule                                               Mental disorder                                              Beta thalassemia minor                          Pt has a *   Anxiety                                                      OSA (obstructive sleep apnea)                                  Comment:uses cpap does not know settings   Difficult  intravenous access                                   Comment:hard stick for ivs and lab per pt   Nasal  bone fracture                             age 49          Comment:left side cannot place tube in nostril per pt   Iron deficiency anemia, unspecified             02/24/2013    Pernicious anemia                               02/24/2013    Anxiety                                                      Complication of anesthesia                      2004           Comment:bp dropped after c section, took 24hrs to feels              leg , numbing medicine does not last long when               injected into skin   Family history of adverse reaction to anesthes*                Comment:mom heart stopped times 2 in or   Diabetes mellitus without complication                         Comment:pre diabetic   GERD (gastroesophageal reflux disease)                       Headache                                                     Fibromyalgia                                                 Reproductive/Obstetrics negative OB ROS                             Anesthesia Physical  Anesthesia Plan  ASA: III  Anesthesia Plan: General   Post-op Pain Management:    Induction:   Airway Management Planned: Simple Face Mask  Additional Equipment:   Intra-op Plan:   Post-operative Plan:   Informed Consent: I have reviewed the patients History and Physical, chart, labs and discussed the procedure including the risks, benefits and alternatives for the proposed anesthesia with the patient or authorized representative who has indicated his/her understanding and acceptance.  Dental Advisory Given  Plan Discussed with: CRNA and Surgeon  Anesthesia Plan Comments:         Anesthesia Quick Evaluation

## 2015-03-18 NOTE — Transfer of Care (Signed)
Immediate Anesthesia Transfer of Care Note  Patient: Stacey [name]  Procedure(s) Performed: ECT  Patient Location: PACU  Anesthesia Type:General  Level of Consciousness: sedated  Airway & Oxygen Therapy: Patient Spontanous Breathing and Patient connected to face mask oxygen  Post-op Assessment: Report given to RN  Post vital signs: Reviewed and stable  Last Vitals:  Filed Vitals:   03/18/15 1236  BP: 141/71  Pulse: 71  Temp: 36.7 C  Resp: 20    Complications: No apparent anesthesia complications

## 2015-03-18 NOTE — Anesthesia Procedure Notes (Signed)
Date/Time: 03/18/2015 12:25 PM Performed by: Lily Kocher Pre-anesthesia Checklist: Patient identified, Timeout performed, Emergency Drugs available, Suction available and Patient being monitored Patient Re-evaluated:Patient Re-evaluated prior to inductionOxygen Delivery Method: Circle system utilized Preoxygenation: Pre-oxygenation with 100% oxygen Intubation Type: IV induction Ventilation: Mask ventilation throughout procedure and Mask ventilation without difficulty Airway Equipment and Method: Bite block Placement Confirmation: positive ETCO2 Dental Injury: Teeth and Oropharynx as per pre-operative assessment

## 2015-03-21 ENCOUNTER — Encounter: Payer: Medicaid Other | Admitting: Anesthesiology

## 2015-03-21 ENCOUNTER — Telehealth (HOSPITAL_COMMUNITY): Payer: Self-pay | Admitting: *Deleted

## 2015-03-21 ENCOUNTER — Encounter
Admission: RE | Admit: 2015-03-21 | Discharge: 2015-03-21 | Disposition: A | Discharge status: Home / Self Care | Payer: Medicaid Other | Source: Ambulatory Visit | Attending: Psychiatry | Admitting: Psychiatry

## 2015-03-21 ENCOUNTER — Other Ambulatory Visit: Payer: Self-pay

## 2015-03-21 ENCOUNTER — Encounter: Payer: Self-pay | Admitting: Anesthesiology

## 2015-03-21 DIAGNOSIS — F332 Major depressive disorder, recurrent severe without psychotic features: Secondary | ICD-10-CM

## 2015-03-21 LAB — POCT PREGNANCY, URINE: PREG TEST UR: NEGATIVE

## 2015-03-21 MED ORDER — SODIUM CHLORIDE 0.9 % IV SOLN
INTRAVENOUS | Status: DC | PRN
  Administered 2015-03-21: 11:00:00 via INTRAVENOUS

## 2015-03-21 MED ORDER — DEXTROSE 5 % IV SOLN
250.0000 mL | Freq: Once | INTRAVENOUS | Status: AC
  Administered 2015-03-21: 250 mL via INTRAVENOUS

## 2015-03-21 MED ORDER — LIDOCAINE HCL (CARDIAC) 20 MG/ML IV SOLN
4.0000 mg | Freq: Once | INTRAVENOUS | Status: AC
  Administered 2015-03-21: 4 mg via INTRAVENOUS

## 2015-03-21 MED ORDER — KETOROLAC TROMETHAMINE 30 MG/ML IJ SOLN
30.0000 mg | Freq: Once | INTRAMUSCULAR | Status: AC
  Administered 2015-03-21: 30 mg via INTRAVENOUS

## 2015-03-21 MED ORDER — METHOHEXITAL SODIUM 100 MG/10ML IV SOSY
140.0000 mg | PREFILLED_SYRINGE | Freq: Once | INTRAVENOUS | Status: AC
  Administered 2015-03-21: 140 mg via INTRAVENOUS

## 2015-03-21 MED ORDER — SUCCINYLCHOLINE CHLORIDE 20 MG/ML IJ SOLN
150.0000 mg | Freq: Once | INTRAMUSCULAR | Status: AC
  Administered 2015-03-21: 150 mg via INTRAVENOUS

## 2015-03-21 NOTE — Transfer of Care (Signed)
Immediate Anesthesia Transfer of Care Note  Patient: Stacey [name]  Procedure(s) Performed: * No procedures listed *  Patient Location: PACU  Anesthesia Type:General  Level of Consciousness: sedated  Airway & Oxygen Therapy: Patient Spontanous Breathing and Patient connected to face mask oxygen  Post-op Assessment: Report given to RN and Post -op Vital signs reviewed and stable  Post vital signs: Reviewed and stable  Last Vitals: 97% 19resp 76hr 137/87  Filed Vitals:   03/21/15 0958  BP: 118/81  Pulse: 81  Temp: 36.8 C  Resp: 18    Complications: No apparent anesthesia complications

## 2015-03-21 NOTE — Anesthesia Preprocedure Evaluation (Signed)
Anesthesia Evaluation  Patient identified by MRN, date of birth, ID band Patient awake    Reviewed: Allergy & Precautions, H&P , NPO status , Patient's Chart, lab work & pertinent test results  History of Anesthesia Complications (+) Family history of anesthesia reaction and history of anesthetic complications  Airway Mallampati: III  TM Distance: >3 FB Neck ROM: full    Dental  (+) Poor Dentition   Pulmonary shortness of breath and with exertion, asthma , sleep apnea and Continuous Positive Airway Pressure Ventilation , former smoker,  Vocal cord dysfucnction treated with neurontin. breath sounds clear to auscultation  Pulmonary exam normal       Cardiovascular Exercise Tolerance: Good Normal cardiovascular examRhythm:regular Rate:Normal     Neuro/Psych  Headaches, PSYCHIATRIC DISORDERS  Neuromuscular disease    GI/Hepatic negative GI ROS, Neg liver ROS, GERD-  Controlled,(+)     substance abuse  alcohol use, dysphagia   Endo/Other  diabetesMorbid obesity  Renal/GU negative Renal ROS     Musculoskeletal   Abdominal   Peds  Hematology negative hematology ROS (+)   Anesthesia Other Findings Past Medical History:   Asthma                                                       Allergic rhinitis, cause unspecified                         Bipolar 1 disorder                                           Morbid obesity                                               Thyroid nodule                                               Mental disorder                                              Beta thalassemia minor                          Pt has a *   Anxiety                                                      OSA (obstructive sleep apnea)                                  Comment:uses cpap does not know settings   Difficult  intravenous access                                   Comment:hard stick for ivs and lab per pt   Nasal  bone fracture                             age 49          Comment:left side cannot place tube in nostril per pt   Iron deficiency anemia, unspecified             02/24/2013    Pernicious anemia                               02/24/2013    Anxiety                                                      Complication of anesthesia                      2004           Comment:bp dropped after c section, took 24hrs to feels              leg , numbing medicine does not last long when               injected into skin   Family history of adverse reaction to anesthes*                Comment:mom heart stopped times 2 in or   Diabetes mellitus without complication                         Comment:pre diabetic   GERD (gastroesophageal reflux disease)                       Headache                                                     Fibromyalgia                                                 Reproductive/Obstetrics negative OB ROS                             Anesthesia Physical Anesthesia Plan  ASA: III  Anesthesia Plan: General   Post-op Pain Management:    Induction: Intravenous  Airway Management Planned: Mask  Additional Equipment:   Intra-op Plan:   Post-operative Plan:   Informed Consent: I have reviewed the patients History and Physical, chart, labs and discussed the procedure including the risks, benefits and alternatives for the proposed anesthesia with the patient or authorized representative who has indicated his/her understanding and acceptance.   Dental advisory  given  Plan Discussed with: CRNA and Surgeon  Anesthesia Plan Comments:         Anesthesia Quick Evaluation

## 2015-03-21 NOTE — Anesthesia Postprocedure Evaluation (Signed)
  Anesthesia Post-op Note  Patient: Stacey [name]  Procedure(s) Performed: * No procedures listed *  Anesthesia type:No value filed.  Patient location: PACU  Post pain: Pain level controlled  Post assessment: Post-op Vital signs reviewed, Patient's Cardiovascular Status Stable, Respiratory Function Stable, Patent Airway and No signs of Nausea or vomiting  Post vital signs: Reviewed and stable  Last Vitals:  Filed Vitals:   03/21/15 1158  BP:   Pulse: 78  Temp:   Resp: 16    Level of consciousness: awake, alert  and patient cooperative  Complications: No apparent anesthesia complications

## 2015-03-21 NOTE — Procedures (Signed)
ECT SERVICES Physician's Interval Evaluation & Treatment Note  Patient Identification: Stacey [name] MRN:  [id number] Date of Evaluation:  03/21/2015 TX #: 3  MADRS:   MMSE:   P.E. Findings:  No change to physical exam  Psychiatric Interval Note:  Reports she is feeling a little better although affect is still flat  Subjective:  Patient is a 49 y.o. female seen for evaluation for Electroconvulsive Therapy. Subjectively says she feels better  Treatment Summary:     Right Unilateral              Bilateral   % Energy : 0.3 ms 100%   Impedance: 720 ohms  Seizure Energy Index: 13,437 V squared  Postictal Suppression Index: 87%  Seizure Concordance Index: 69%  Medications  Pre Shock: Xylocaine 4 mg, Brevital 140 mg, Toradol 30 mg, succinylcholine 150 mg  Post Shock: None  Seizure Duration: 9 seconds by EMG, 20 seconds by EEG   Comments: Next secondungs: next treatment scheduled Wednesday     Clear to auscultation                Other:   Heart:      Regular rhythm              irregular rhythm      Previous H&P reviewed, patient examined and there are NO CHANGES                   Previous H&P reviewed, patient examined and there are changes noted.   Mordecai Rasmussen, MD 8/22/201610:59 AM

## 2015-03-21 NOTE — Telephone Encounter (Signed)
Called for prior authorization of ECT was told it must be submitted online or fill out form from website and fax. Filled out form and sent to office for MD signature and to fax once complete.

## 2015-03-23 ENCOUNTER — Encounter: Payer: Self-pay | Admitting: Anesthesiology

## 2015-03-23 ENCOUNTER — Encounter
Admission: RE | Admit: 2015-03-23 | Discharge: 2015-03-23 | Disposition: A | Discharge status: Home / Self Care | Payer: Medicaid Other | Source: Ambulatory Visit | Attending: Psychiatry | Admitting: Psychiatry

## 2015-03-23 ENCOUNTER — Other Ambulatory Visit: Payer: Self-pay | Admitting: *Deleted

## 2015-03-23 DIAGNOSIS — F332 Major depressive disorder, recurrent severe without psychotic features: Secondary | ICD-10-CM

## 2015-03-23 MED ORDER — DEXTROSE 5 % IV SOLN
250.0000 mL | Freq: Once | INTRAVENOUS | Status: AC
  Administered 2015-03-23: 250 mL via INTRAVENOUS

## 2015-03-23 MED ORDER — KETOROLAC TROMETHAMINE 30 MG/ML IJ SOLN
30.0000 mg | Freq: Once | INTRAMUSCULAR | Status: AC
  Administered 2015-03-23: 30 mg via INTRAVENOUS

## 2015-03-23 MED ORDER — SUCCINYLCHOLINE CHLORIDE 20 MG/ML IJ SOLN
150.0000 mg | Freq: Once | INTRAMUSCULAR | Status: AC
  Administered 2015-03-23: 150 mg via INTRAVENOUS

## 2015-03-23 MED ORDER — ONDANSETRON HCL 4 MG/2ML IJ SOLN: 4.0000 mg | Freq: Once | INTRAMUSCULAR | Status: AC | PRN

## 2015-03-23 MED ORDER — METHOHEXITAL SODIUM 100 MG/10ML IV SOSY
140.0000 mg | PREFILLED_SYRINGE | Freq: Once | INTRAVENOUS | Status: AC
  Administered 2015-03-23: 140 mg via INTRAVENOUS

## 2015-03-23 MED ORDER — FENTANYL CITRATE (PF) 100 MCG/2ML IJ SOLN: 25.0000 ug | INTRAMUSCULAR | Status: AC | PRN

## 2015-03-23 MED ORDER — LIDOCAINE HCL (CARDIAC) 20 MG/ML IV SOLN
4.0000 mg | Freq: Once | INTRAVENOUS | Status: AC
  Administered 2015-03-23: 4 mg via INTRAVENOUS

## 2015-03-23 NOTE — Anesthesia Preprocedure Evaluation (Signed)
Anesthesia Evaluation  Patient identified by MRN, date of birth, ID band Patient awake    Reviewed: Allergy & Precautions, H&P , NPO status , Patient's Chart, lab work & pertinent test results  History of Anesthesia Complications (+) Family history of anesthesia reaction and history of anesthetic complications  Airway Mallampati: III  TM Distance: >3 FB Neck ROM: full    Dental  (+) Poor Dentition   Pulmonary shortness of breath and with exertion, asthma , sleep apnea and Continuous Positive Airway Pressure Ventilation , former smoker,  Vocal cord dysfucnction treated with neurontin. breath sounds clear to auscultation  Pulmonary exam normal       Cardiovascular Exercise Tolerance: Good Normal cardiovascular examRhythm:regular Rate:Normal     Neuro/Psych  Headaches, PSYCHIATRIC DISORDERS  Neuromuscular disease    GI/Hepatic negative GI ROS, Neg liver ROS, GERD-  Controlled,(+)     substance abuse  alcohol use, dysphagia   Endo/Other  diabetesMorbid obesity  Renal/GU negative Renal ROS     Musculoskeletal  (+) Fibromyalgia -  Abdominal   Peds  Hematology negative hematology ROS (+) anemia ,   Anesthesia Other Findings Past Medical History:   Asthma                                                       Allergic rhinitis, cause unspecified                         Bipolar 1 disorder                                           Morbid obesity                                               Thyroid nodule                                               Mental disorder                                              Beta thalassemia minor                          Pt has a *   Anxiety                                                      OSA (obstructive sleep apnea)                                  Comment:uses cpap does not  know settings   Difficult intravenous access                                   Comment:hard stick for  ivs and lab per pt   Nasal bone fracture                             age 49          Comment:left side cannot place tube in nostril per pt   Iron deficiency anemia, unspecified             02/24/2013    Pernicious anemia                               02/24/2013    Anxiety                                                      Complication of anesthesia                      2004           Comment:bp dropped after c section, took 24hrs to feels              leg , numbing medicine does not last long when               injected into skin   Family history of adverse reaction to anesthes*                Comment:mom heart stopped times 2 in or   Diabetes mellitus without complication                         Comment:pre diabetic   GERD (gastroesophageal reflux disease)                       Headache                                                     Fibromyalgia                                                 Reproductive/Obstetrics negative OB ROS                             Anesthesia Physical  Anesthesia Plan  ASA: III  Anesthesia Plan: General   Post-op Pain Management:    Induction: Intravenous  Airway Management Planned: Mask  Additional Equipment:   Intra-op Plan:   Post-operative Plan:   Informed Consent: I have reviewed the patients History and Physical, chart, labs and discussed the procedure including the risks, benefits and alternatives for the proposed anesthesia with the patient or authorized representative who has indicated his/her understanding  and acceptance.   Dental advisory given  Plan Discussed with: CRNA and Surgeon  Anesthesia Plan Comments:         Anesthesia Quick Evaluation

## 2015-03-23 NOTE — Procedures (Signed)
ECT SERVICES Physician's Interval Evaluation & Treatment Note  Patient Identification: Stacey [name] MRN:  [id number] Date of Evaluation:  03/23/2015 TX #: 4  MADRS: 19  MMSE: 30  P.E. Findings:  No new physical change or finding  Psychiatric Interval Note:  Patient reports feeling better. Her MADRS score is quite a bit better.  Subjective:  Patient is a 49 y.o. female seen for evaluation for Electroconvulsive Therapy. Patrons is reporting feeling much better  Treatment Summary:     Right Unilateral              Bilateral   % Energy : 0.3 ms 100%   Impedance: 830 ohms  Seizure Energy Index: 14,652 V squared  Postictal Suppression Index: 53%  Seizure Concordance Index: 89%  Medications  Pre Shock: Xylocaine 4 mg Brevital 120 mg Toradol 30 mg succinylcholine 150 mg  Post Shock:    Seizure Duration: 13 seconds by EMG 26 seconds by EEG   Comments: Follow-up Friday   Lungs:    Clear to auscultation                Other:   Heart:      Regular rhythm              irregular rhythm      Previous H&P reviewed, patient examined and there are NO CHANGES                   Previous H&P reviewed, patient examined and there are changes noted.   Mordecai Rasmussen, MD 8/24/201612:05 PM

## 2015-03-23 NOTE — Anesthesia Procedure Notes (Signed)
Date/Time: 03/23/2015 12:02 PM Performed by: Lily Kocher Pre-anesthesia Checklist: Patient identified, Timeout performed, Emergency Drugs available, Suction available and Patient being monitored Patient Re-evaluated:Patient Re-evaluated prior to inductionOxygen Delivery Method: Circle system utilized Preoxygenation: Pre-oxygenation with 100% oxygen Intubation Type: IV induction Ventilation: Mask ventilation without difficulty Airway Equipment and Method: Bite block Placement Confirmation: positive ETCO2 Dental Injury: Teeth and Oropharynx as per pre-operative assessment

## 2015-03-23 NOTE — Anesthesia Postprocedure Evaluation (Signed)
  Anesthesia Post-op Note  Patient: Stacey [name]  Procedure(s) Performed: * No procedures listed *  Anesthesia type:General  Patient location: PACU  Post pain: Pain level controlled  Post assessment: Post-op Vital signs reviewed, Patient's Cardiovascular Status Stable, Respiratory Function Stable, Patent Airway and No signs of Nausea or vomiting  Post vital signs: Reviewed and stable  Last Vitals:  Filed Vitals:   03/23/15 1250  BP: 123/67  Pulse: 81  Temp: 36.7 C  Resp: 18    Level of consciousness: awake, alert  and patient cooperative  Complications: No apparent anesthesia complications

## 2015-03-23 NOTE — Transfer of Care (Signed)
Immediate Anesthesia Transfer of Care Note  Patient: Stacey [name]  Procedure(s) Performed: ECT  Patient Location: PACU  Anesthesia Type:General  Level of Consciousness: sedated  Airway & Oxygen Therapy: Patient Spontanous Breathing  Post-op Assessment: Report given to RN  Post vital signs: Reviewed and stable  Last Vitals:  Filed Vitals:   03/23/15 1014  BP: 119/63  Pulse: 74  Temp: 36.9 C  Resp: 18    Complications: No apparent anesthesia complications

## 2015-03-25 ENCOUNTER — Other Ambulatory Visit: Payer: Self-pay

## 2015-03-25 ENCOUNTER — Encounter: Payer: Self-pay | Admitting: Anesthesiology

## 2015-03-25 ENCOUNTER — Encounter
Admission: RE | Admit: 2015-03-25 | Discharge: 2015-03-25 | Disposition: A | Discharge status: Home / Self Care | Payer: Medicaid Other | Source: Ambulatory Visit | Attending: Family Medicine | Admitting: Family Medicine

## 2015-03-25 DIAGNOSIS — F332 Major depressive disorder, recurrent severe without psychotic features: Secondary | ICD-10-CM

## 2015-03-25 MED ORDER — KETOROLAC TROMETHAMINE 30 MG/ML IJ SOLN
30.0000 mg | Freq: Once | INTRAMUSCULAR | Status: AC
  Administered 2015-03-25: 30 mg via INTRAVENOUS

## 2015-03-25 MED ORDER — SUCCINYLCHOLINE CHLORIDE 20 MG/ML IJ SOLN
150.0000 mg | Freq: Once | INTRAMUSCULAR | Status: AC
  Administered 2015-03-25: 150 mg via INTRAVENOUS

## 2015-03-25 MED ORDER — LIDOCAINE HCL (CARDIAC) 20 MG/ML IV SOLN
4.0000 mg | Freq: Once | INTRAVENOUS | Status: AC
  Administered 2015-03-25: 4 mg via INTRAVENOUS

## 2015-03-25 MED ORDER — METHOHEXITAL SODIUM 100 MG/10ML IV SOSY
140.0000 mg | PREFILLED_SYRINGE | Freq: Once | INTRAVENOUS | Status: AC
  Administered 2015-03-25: 140 mg via INTRAVENOUS

## 2015-03-25 MED ORDER — DEXTROSE 5 % IV SOLN
250.0000 mL | Freq: Once | INTRAVENOUS | Status: AC
  Administered 2015-03-25: 250 mL via INTRAVENOUS

## 2015-03-25 NOTE — Anesthesia Postprocedure Evaluation (Signed)
  Anesthesia Post-op Note  Patient: Stacey [name]  Procedure(s) Performed: * No procedures listed *  Anesthesia type:General  Patient location: PACU  Post pain: Pain level controlled  Post assessment: Post-op Vital signs reviewed, Patient's Cardiovascular Status Stable, Respiratory Function Stable, Patent Airway and No signs of Nausea or vomiting  Post vital signs: Reviewed and stable  Last Vitals:  Filed Vitals:   03/25/15 1208  BP: 125/74  Pulse: 65  Temp: 38.7 C  Resp: 25    Level of consciousness: awake, alert  and patient cooperative  Complications: No apparent anesthesia complications

## 2015-03-25 NOTE — Anesthesia Preprocedure Evaluation (Signed)
Anesthesia Evaluation  Patient identified by MRN, date of birth, ID band Patient awake    Reviewed: Allergy & Precautions, NPO status , Patient's Chart, lab work & pertinent test results  Airway Mallampati: III       Dental no notable dental hx.    Pulmonary asthma , sleep apnea , former smoker,    + decreased breath sounds      Cardiovascular negative cardio ROS Normal cardiovascular exam    Neuro/Psych  Headaches, Anxiety Depression Bipolar Disorder    GI/Hepatic Neg liver ROS, GERD-  ,  Endo/Other  diabetes, Type 2, Oral Hypoglycemic AgentsMorbid obesity  Renal/GU negative Renal ROS     Musculoskeletal   Abdominal (+) + obese,   Peds negative pediatric ROS (+)  Hematology  (+) anemia ,   Anesthesia Other Findings   Reproductive/Obstetrics                             Anesthesia Physical Anesthesia Plan  ASA: III  Anesthesia Plan: General   Post-op Pain Management:    Induction: Intravenous  Airway Management Planned: Nasal Cannula  Additional Equipment:   Intra-op Plan:   Post-operative Plan:   Informed Consent: I have reviewed the patients History and Physical, chart, labs and discussed the procedure including the risks, benefits and alternatives for the proposed anesthesia with the patient or authorized representative who has indicated his/her understanding and acceptance.     Plan Discussed with: CRNA  Anesthesia Plan Comments:         Anesthesia Quick Evaluation

## 2015-03-25 NOTE — Procedures (Signed)
ECT SERVICES Physician's Interval Evaluation & Treatment Note  Patient Identification: Stacey [name] MRN:  [id number] Date of Evaluation:  03/25/2015 TX #: 5  MADRS:   MMSE:   P.E. Findings:  No change to physical exam  Psychiatric Interval Note:  Continues to have improvement in mood  Subjective:  Patient is a 49 y.o. female seen for evaluation for Electroconvulsive Therapy. No new complaints  Treatment Summary:     Right Unilateral              Bilateral   % Energy : 0.3 ms 100%   Impedance: 850 ohms  Seizure Energy Index: 11,933 V squared  Postictal Suppression Index: 9o percent  Seizure Concordance Index: 87%  Medications  Pre Shock: Xylocaine 4 mg, Toradol 30 mg, Brevital 140 mg, succinylcholine 150 mg  Post Shock: Xylocaine 4 mg Brevital 140 mg Toradol 30 mg succinylcholine 150 mg  Seizure Duration: None   Comments: Patient continues to improve. Scheduled for Monday ongoing evaluation   Lungs:    Clear to auscultation                Other:   Heart:      Regular rhythm              irregular rhythm      Previous H&P reviewed, patient examined and there are NO CHANGES                   Previous H&P reviewed, patient examined and there are changes noted.   Mordecai Rasmussen, MD 8/26/201611:49 AM

## 2015-03-25 NOTE — Transfer of Care (Signed)
Immediate Anesthesia Transfer of Care Note  Patient: Stacey [name]  Procedure(s) Performed: * No procedures listed *  Patient Location: PACU  Anesthesia Type:General  Level of Consciousness: awake, alert , oriented and patient cooperative  Airway & Oxygen Therapy: Patient Spontanous Breathing and Patient connected to face mask oxygen  Post-op Assessment: Report given to RN, Post -op Vital signs reviewed and stable and Patient moving all extremities X 4  Post vital signs: Reviewed and stable  Last Vitals:  Filed Vitals:   03/25/15 1207  BP: 125/74  Pulse: 68  Temp: 38.7 C  Resp: 20    Complications: No apparent anesthesia complications

## 2015-03-28 ENCOUNTER — Other Ambulatory Visit: Payer: Self-pay

## 2015-03-28 ENCOUNTER — Encounter
Admission: RE | Admit: 2015-03-28 | Discharge: 2015-03-28 | Disposition: A | Discharge status: Home / Self Care | Payer: Medicaid Other | Source: Ambulatory Visit | Attending: Psychiatry | Admitting: Psychiatry

## 2015-03-28 ENCOUNTER — Encounter: Payer: Medicaid Other | Admitting: Anesthesiology

## 2015-03-28 ENCOUNTER — Encounter: Payer: Self-pay | Admitting: Anesthesiology

## 2015-03-28 DIAGNOSIS — F332 Major depressive disorder, recurrent severe without psychotic features: Secondary | ICD-10-CM | POA: Insufficient documentation

## 2015-03-28 MED ORDER — KETOROLAC TROMETHAMINE 30 MG/ML IJ SOLN
30.0000 mg | Freq: Once | INTRAMUSCULAR | Status: AC
  Administered 2015-03-28: 30 mg via INTRAVENOUS

## 2015-03-28 MED ORDER — METHOHEXITAL SODIUM 100 MG/10ML IV SOSY
140.0000 mg | PREFILLED_SYRINGE | Freq: Once | INTRAVENOUS | Status: AC
  Administered 2015-03-28: 140 mg via INTRAVENOUS

## 2015-03-28 MED ORDER — DEXTROSE 5 % IV SOLN
250.0000 mL | Freq: Once | INTRAVENOUS | Status: AC
  Administered 2015-03-28: 250 mL via INTRAVENOUS

## 2015-03-28 MED ORDER — DEXTROSE 5 % IV SOLN
INTRAVENOUS | Status: DC | PRN
  Administered 2015-03-28: 11:00:00 via INTRAVENOUS

## 2015-03-28 MED ORDER — SUCCINYLCHOLINE CHLORIDE 20 MG/ML IJ SOLN
150.0000 mg | Freq: Once | INTRAMUSCULAR | Status: AC
  Administered 2015-03-28: 150 mg via INTRAVENOUS

## 2015-03-28 MED ORDER — LIDOCAINE HCL (CARDIAC) 20 MG/ML IV SOLN
4.0000 mg | Freq: Once | INTRAVENOUS | Status: AC
  Administered 2015-03-28: 4 mg via INTRAVENOUS

## 2015-03-28 NOTE — Anesthesia Postprocedure Evaluation (Signed)
  Anesthesia Post-op Note  Patient: Stacey [name]  Procedure(s) Performed: * No procedures listed *  Anesthesia type:General  Patient location: PACU  Post pain: Pain level controlled  Post assessment: Post-op Vital signs reviewed, Patient's Cardiovascular Status Stable, Respiratory Function Stable, Patent Airway and No signs of Nausea or vomiting  Post vital signs: Reviewed and stable  Last Vitals:  Filed Vitals:   03/28/15 1139  BP: 160/86  Pulse: 89  Temp:   Resp: 21    Level of consciousness: awake, alert  and patient cooperative  Complications: No apparent anesthesia complications

## 2015-03-28 NOTE — Transfer of Care (Signed)
Immediate Anesthesia Transfer of Care Note  Patient: Stacey [name]  Procedure(s) Performed: * No procedures listed *  Patient Location: PACU  Anesthesia Type:General  Level of Consciousness: patient cooperative and lethargic  Airway & Oxygen Therapy: Patient Spontanous Breathing and Patient connected to face mask oxygen  Post-op Assessment: Report given to RN and Post -op Vital signs reviewed and stable  Post vital signs: Reviewed and stable  Last Vitals:  Filed Vitals:   03/28/15 1128  BP: 137/91  Pulse: 79  Temp: 37 C  Resp: 23    Complications: No apparent anesthesia complications

## 2015-03-28 NOTE — Procedures (Signed)
ECT SERVICES Physician's Interval Evaluation & Treatment Note  Patient Identification: Stacey [name] MRN:  [id number] Date of Evaluation:  03/28/2015 TX #: 6  MADRS:   MMSE:   P.E. Findings:  No change to physical exam  Psychiatric Interval Note:  Mood continues to be reported as being improved. No clear cut change from Friday. Does seem to possibly be having some memory impairment  Subjective:  Patient is a 49 y.o. female seen for evaluation for Electroconvulsive Therapy. Patient states she continues to feel good. No new complaints  Treatment Summary:     Right Unilateral              Bilateral   % Energy : 0.3 ms 100%   Impedance: 1160 ohms  Seizure Energy Index: Energy index was not read due to faulty monitoring  Postictal Suppression Index: Also not read due to faulty monitoring  Seizure Concordance Index: 81%  Medications  Pre Shock: Xylocaine 4 mg, Brevital 140 mg, Toradol 30 mg, succinylcholine 150 mg  Post Shock: None  Seizure Duration: 10 seconds by EMG, EEG seizure could not be determined with any accuracy   Comments: Patient is advised I would like to see her back for at least one further treatment on Wednesday before deciding on maintenance schedule. She is agreeable   Lungs:    Clear to auscultation                Other:   Heart:      Regular rhythm              irregular rhythm      Previous H&P reviewed, patient examined and there are NO CHANGES                   Previous H&P reviewed, patient examined and there are changes noted.   Mordecai Rasmussen, MD 8/29/201611:23 AM

## 2015-03-28 NOTE — Anesthesia Preprocedure Evaluation (Signed)
Anesthesia Evaluation   Patient awake    Reviewed: Allergy & Precautions, NPO status , Patient's Chart, lab work & pertinent test results  Airway Mallampati: III       Dental no notable dental hx.    Pulmonary shortness of breath, sleep apnea , former smoker,    + decreased breath sounds      Cardiovascular Normal cardiovascular exam    Neuro/Psych  Headaches, Anxiety Depression Bipolar Disorder    GI/Hepatic Neg liver ROS, GERD-  ,  Endo/Other  diabetes, Type 2  Renal/GU negative Renal ROS     Musculoskeletal  (+) Fibromyalgia -  Abdominal (+) + obese,   Peds  Hematology  (+) anemia ,   Anesthesia Other Findings   Reproductive/Obstetrics                             Anesthesia Physical Anesthesia Plan  ASA: III  Anesthesia Plan: General   Post-op Pain Management:    Induction: Intravenous  Airway Management Planned: Nasal Cannula and Natural Airway  Additional Equipment:   Intra-op Plan:   Post-operative Plan:   Informed Consent: I have reviewed the patients History and Physical, chart, labs and discussed the procedure including the risks, benefits and alternatives for the proposed anesthesia with the patient or authorized representative who has indicated his/her understanding and acceptance.     Plan Discussed with: CRNA  Anesthesia Plan Comments:         Anesthesia Quick Evaluation

## 2015-03-30 ENCOUNTER — Encounter: Payer: Self-pay | Admitting: Anesthesiology

## 2015-03-30 ENCOUNTER — Encounter
Admission: RE | Admit: 2015-03-30 | Discharge: 2015-03-30 | Disposition: A | Discharge status: Home / Self Care | Payer: Medicaid Other | Source: Ambulatory Visit | Attending: Family Medicine | Admitting: Family Medicine

## 2015-03-30 DIAGNOSIS — F332 Major depressive disorder, recurrent severe without psychotic features: Secondary | ICD-10-CM | POA: Insufficient documentation

## 2015-03-30 LAB — POCT PREGNANCY, URINE: Preg Test, Ur: NEGATIVE

## 2015-03-30 MED ORDER — KETOROLAC TROMETHAMINE 30 MG/ML IJ SOLN
30.0000 mg | Freq: Once | INTRAMUSCULAR | Status: AC
  Administered 2015-03-30: 30 mg via INTRAVENOUS

## 2015-03-30 MED ORDER — SUCCINYLCHOLINE CHLORIDE 20 MG/ML IJ SOLN
150.0000 mg | Freq: Once | INTRAMUSCULAR | Status: AC
  Administered 2015-03-30: 150 mg via INTRAVENOUS

## 2015-03-30 MED ORDER — LIDOCAINE HCL (CARDIAC) 20 MG/ML IV SOLN
4.0000 mg | Freq: Once | INTRAVENOUS | Status: AC
  Administered 2015-03-30: 4 mg via INTRAVENOUS

## 2015-03-30 MED ORDER — METHOHEXITAL SODIUM 100 MG/10ML IV SOSY
140.0000 mg | PREFILLED_SYRINGE | Freq: Once | INTRAVENOUS | Status: AC
  Administered 2015-03-30: 140 mg via INTRAVENOUS

## 2015-03-30 MED ORDER — DEXTROSE 5 % IV SOLN
250.0000 mL | Freq: Once | INTRAVENOUS | Status: AC
  Administered 2015-03-30: 250 mL via INTRAVENOUS

## 2015-03-30 MED ORDER — DEXAMETHASONE SODIUM PHOSPHATE 10 MG/ML IJ SOLN: 8.0000 mg | Freq: Once | INTRAMUSCULAR | Status: DC | PRN

## 2015-03-30 NOTE — Anesthesia Procedure Notes (Signed)
Date/Time: 03/30/2015 11:15 AM Performed by: Lily Kocher Pre-anesthesia Checklist: Patient identified, Emergency Drugs available, Suction available and Patient being monitored Patient Re-evaluated:Patient Re-evaluated prior to inductionOxygen Delivery Method: Circle system utilized Preoxygenation: Pre-oxygenation with 100% oxygen Intubation Type: IV induction Ventilation: Mask ventilation without difficulty and Mask ventilation throughout procedure Airway Equipment and Method: Bite block Placement Confirmation: positive ETCO2 Dental Injury: Teeth and Oropharynx as per pre-operative assessment

## 2015-03-30 NOTE — Transfer of Care (Signed)
Immediate Anesthesia Transfer of Care Note  Patient: Stacey [name]  Procedure(s) Performed: ECT  Patient Location: PACU  Anesthesia Type:General  Level of Consciousness: sedated  Airway & Oxygen Therapy: Patient Spontanous Breathing and Patient connected to face mask oxygen  Post-op Assessment: Report given to RN  Post vital signs: Reviewed and stable  Last Vitals:  Filed Vitals:   03/30/15 1125  BP: 113/56  Pulse: 79  Temp: 36.9 C  Resp: 18    Complications: No apparent anesthesia complications

## 2015-03-30 NOTE — Procedures (Signed)
ECT SERVICES Physician's Interval Evaluation & Treatment Note  Patient Identification: Stacey [name] MRN:  [id number] Date of Evaluation:  03/30/2015 TX #: 7  MADRS:   MMSE:   P.E. Findings:  No change to physical exam  Psychiatric Interval Note:  Mood continues to be improved and stable  Subjective:  Patient is a 49 y.o. female seen for evaluation for Electroconvulsive Therapy. No new specific complaint  Treatment Summary:     Right Unilateral              Bilateral   % Energy : 0.3 ms 100%   Impedance: 860 ohms  Seizure Energy Index: 7484 V squared  Postictal Suppression Index: 89%  Seizure Concordance Index: 76%  Medications  Pre Shock: Xylocaine 4 mg, Toradol 30 mg, Brevital 140 mg, succinylcholine 150 mg  Post Shock:    Seizure Duration: 5 seconds by EMG, 22 seconds by EEG   Comments: Patient appears to be stabilizing. Plan is to discontinue index treatment and have follow-up for first maintenance in 1 week on September 7   Lungs:    Clear to auscultation                Other:   Heart:      Regular rhythm              irregular rhythm      Previous H&P reviewed, patient examined and there are NO CHANGES                   Previous H&P reviewed, patient examined and there are changes noted.   Mordecai Rasmussen, MD 8/31/201611:09 AM

## 2015-03-30 NOTE — Anesthesia Preprocedure Evaluation (Signed)
Anesthesia Evaluation  Patient identified by MRN, date of birth, ID band Patient awake    Reviewed: Allergy & Precautions, NPO status , Patient's Chart, lab work & pertinent test results  Airway Mallampati: III  TM Distance: >3 FB Neck ROM: Full    Dental  (+) Teeth Intact   Pulmonary shortness of breath and with exertion, sleep apnea , former smoker,    Pulmonary exam normal       Cardiovascular Exercise Tolerance: Poor Normal cardiovascular exam    Neuro/Psych    GI/Hepatic GERD-  Medicated and Controlled,  Endo/Other  diabetes  Renal/GU      Musculoskeletal  (+) Fibromyalgia -  Abdominal (+) + obese,   Peds  Hematology   Anesthesia Other Findings   Reproductive/Obstetrics                             Anesthesia Physical Anesthesia Plan  ASA: III  Anesthesia Plan: General   Post-op Pain Management:    Induction: Intravenous  Airway Management Planned: Mask  Additional Equipment:   Intra-op Plan:   Post-operative Plan:   Informed Consent: I have reviewed the patients History and Physical, chart, labs and discussed the procedure including the risks, benefits and alternatives for the proposed anesthesia with the patient or authorized representative who has indicated his/her understanding and acceptance.     Plan Discussed with: CRNA  Anesthesia Plan Comments:         Anesthesia Quick Evaluation

## 2015-03-30 NOTE — Anesthesia Postprocedure Evaluation (Signed)
  Anesthesia Post-op Note  Patient: Stacey [name]  Procedure(s) Performed: * No procedures listed *  Anesthesia type:General  Patient location: PACU  Post pain: Pain level controlled  Post assessment: Post-op Vital signs reviewed, Patient's Cardiovascular Status Stable, Respiratory Function Stable, Patent Airway and No signs of Nausea or vomiting  Post vital signs: Reviewed and stable  Last Vitals:  Filed Vitals:   03/30/15 1125  BP: 113/56  Pulse: 79  Temp: 36.9 C  Resp: 18    Level of consciousness: awake, alert  and patient cooperative  Complications: No apparent anesthesia complications

## 2015-03-30 NOTE — Anesthesia Postprocedure Evaluation (Signed)
  Anesthesia Post-op Note  Patient: Stacey [name]  Procedure(s) Performed: * No procedures listed *  Anesthesia type:General  Patient location: PACU  Post pain: Pain level controlled  Post assessment: Post-op Vital signs reviewed, Patient's Cardiovascular Status Stable, Respiratory Function Stable, Patent Airway and No signs of Nausea or vomiting  Post vital signs: Reviewed and stable  Last Vitals:  Filed Vitals:   03/30/15 1200  BP: 112/70  Pulse: 90  Temp:   Resp: 18    Level of consciousness: awake, alert  and patient cooperative  Complications: No apparent anesthesia complications

## 2015-04-01 ENCOUNTER — Other Ambulatory Visit: Payer: Self-pay

## 2015-04-06 ENCOUNTER — Encounter
Admission: RE | Admit: 2015-04-06 | Discharge: 2015-04-06 | Disposition: A | Discharge status: Home / Self Care | Payer: Medicaid Other | Source: Ambulatory Visit | Attending: Psychiatry | Admitting: Psychiatry

## 2015-04-06 ENCOUNTER — Encounter: Payer: Self-pay | Admitting: Anesthesiology

## 2015-04-06 DIAGNOSIS — F332 Major depressive disorder, recurrent severe without psychotic features: Secondary | ICD-10-CM | POA: Insufficient documentation

## 2015-04-06 LAB — POCT PREGNANCY, URINE: Preg Test, Ur: NEGATIVE

## 2015-04-06 MED ORDER — KETOROLAC TROMETHAMINE 30 MG/ML IJ SOLN
30.0000 mg | Freq: Once | INTRAMUSCULAR | Status: AC
  Administered 2015-04-06: 30 mg via INTRAVENOUS

## 2015-04-06 MED ORDER — LIDOCAINE HCL (CARDIAC) 20 MG/ML IV SOLN
4.0000 mg | Freq: Once | INTRAVENOUS | Status: AC
  Administered 2015-04-06: 4 mg via INTRAVENOUS

## 2015-04-06 MED ORDER — METHOHEXITAL SODIUM 100 MG/10ML IV SOSY
140.0000 mg | PREFILLED_SYRINGE | Freq: Once | INTRAVENOUS | Status: AC
  Administered 2015-04-06: 140 mg via INTRAVENOUS

## 2015-04-06 MED ORDER — SUCCINYLCHOLINE CHLORIDE 20 MG/ML IJ SOLN
150.0000 mg | Freq: Once | INTRAMUSCULAR | Status: AC
  Administered 2015-04-06: 150 mg via INTRAVENOUS

## 2015-04-06 MED ORDER — DEXTROSE 5 % IV SOLN
250.0000 mL | Freq: Once | INTRAVENOUS | Status: AC
  Administered 2015-04-06: 250 mL via INTRAVENOUS

## 2015-04-06 NOTE — Anesthesia Preprocedure Evaluation (Addendum)
Anesthesia Evaluation  Patient identified by MRN, date of birth, ID band Patient awake    Reviewed: Allergy & Precautions, NPO status , Patient's Chart, lab work & pertinent test results  Airway Mallampati: III  TM Distance: >3 FB Neck ROM: Full    Dental  (+) Teeth Intact   Pulmonary shortness of breath and with exertion, asthma , sleep apnea , former smoker,    Pulmonary exam normal        Cardiovascular Exercise Tolerance: Poor Normal cardiovascular exam     Neuro/Psych  Headaches,  Neuromuscular disease (fibromyalgia)    GI/Hepatic GERD  Medicated and Controlled,  Endo/Other  diabetes  Renal/GU      Musculoskeletal  (+) Fibromyalgia -  Abdominal (+) + obese,   Peds  Hematology   Anesthesia Other Findings Past Medical History:   Asthma                                                       Allergic rhinitis, cause unspecified                         Bipolar 1 disorder                                           Morbid obesity                                               Thyroid nodule                                               Mental disorder                                              Beta thalassemia minor                          Pt has a *   Anxiety                                                      OSA (obstructive sleep apnea)                                  Comment:uses cpap does not know settings   Difficult intravenous access                                   Comment:hard stick for ivs and lab per pt   Nasal bone fracture  age 59          Comment:left side cannot place tube in nostril per pt   Iron deficiency anemia, unspecified             02/24/2013    Pernicious anemia                               02/24/2013    Anxiety                                                      Complication of anesthesia                      2004           Comment:bp dropped after c  section, took 24hrs to feels              leg , numbing medicine does not last long when               injected into skin   Family history of adverse reaction to anesthes*                Comment:mom heart stopped times 2 in or   Diabetes mellitus without complication                         Comment:pre diabetic   GERD (gastroesophageal reflux disease)                       Headache                                                     Fibromyalgia                                                 Reproductive/Obstetrics                            Anesthesia Physical  Anesthesia Plan  ASA: III  Anesthesia Plan: General   Post-op Pain Management:    Induction: Intravenous  Airway Management Planned: Mask  Additional Equipment:   Intra-op Plan:   Post-operative Plan:   Informed Consent: I have reviewed the patients History and Physical, chart, labs and discussed the procedure including the risks, benefits and alternatives for the proposed anesthesia with the patient or authorized representative who has indicated his/her understanding and acceptance.     Plan Discussed with: CRNA, Anesthesiologist and Surgeon  Anesthesia Plan Comments:        Anesthesia Quick Evaluation

## 2015-04-06 NOTE — Procedures (Signed)
ECT SERVICES Physician's Interval Evaluation & Treatment Note  Patient Identification: Stacey [name] MRN:  [id number] Date of Evaluation:  04/06/2015 TX #: 8  MADRS:   MMSE:   P.E. Findings: No new physical exam findings  No new physical exam findings  Psychiatric Interval Note:  Mood has been stable no change  Subjective:  Patient is a 49 y.o. female seen for evaluation for Electroconvulsive Therapy. No new complaint  Treatment Summary:     Right Unilateral              Bilateral   % Energy : 0.3 ms 100%   Impedance: 830 ohms  Seizure Energy Index: 11,105 V squared  Postictal Suppression Index: 79%  Seizure Concordance Index: 87%  Medications  Pre Shock: Xylocaine 4 mg, Toradol 30 mg, Brevital 140 mg, succinylcholine 150 mg  Post Shock: None  Seizure Duration: 8 seconds by EMG, 23 seconds by EEG   Comments: Follow-up one week for continued plan towards maintenance treatment   Lungs:    Clear to auscultation                Other:   Heart:      Regular rhythm              irregular rhythm      Previous H&P reviewed, patient examined and there are NO CHANGES                   Previous H&P reviewed, patient examined and there are changes noted.   Mordecai Rasmussen, MD 9/7/201611:28 AM

## 2015-04-06 NOTE — Anesthesia Procedure Notes (Signed)
Date/Time: 04/06/2015 11:24 AM Performed by: Lily Kocher Pre-anesthesia Checklist: Patient identified, Emergency Drugs available, Suction available and Patient being monitored Patient Re-evaluated:Patient Re-evaluated prior to inductionOxygen Delivery Method: Circle system utilized Preoxygenation: Pre-oxygenation with 100% oxygen Intubation Type: IV induction Ventilation: Mask ventilation without difficulty and Mask ventilation throughout procedure Airway Equipment and Method: Bite block Placement Confirmation: positive ETCO2 Dental Injury: Teeth and Oropharynx as per pre-operative assessment

## 2015-04-06 NOTE — Transfer of Care (Signed)
Immediate Anesthesia Transfer of Care Note  Patient: Stacey [name]  Procedure(s) Performed: ECT  Patient Location: PACU  Anesthesia Type:General  Level of Consciousness: sedated  Airway & Oxygen Therapy: Patient Spontanous Breathing and Patient connected to face mask oxygen  Post-op Assessment: Report given to RN  Post vital signs: Reviewed and stable  Last Vitals:  Filed Vitals:   04/06/15 1135  BP: 151/80  Pulse: 75  Temp: 99.63F  Resp: 18    Complications: No apparent anesthesia complications

## 2015-04-08 NOTE — Anesthesia Postprocedure Evaluation (Signed)
  Anesthesia Post-op Note  Patient: Stacey [name]  Procedure(s) Performed: * No procedures listed *  Anesthesia type:General  Patient location: PACU  Post pain: Pain level controlled  Post assessment: Post-op Vital signs reviewed, Patient's Cardiovascular Status Stable, Respiratory Function Stable, Patent Airway and No signs of Nausea or vomiting  Post vital signs: Reviewed and stable  Last Vitals:  Filed Vitals:   04/06/15 1215  BP: 130/83  Pulse: 91  Temp:   Resp: 18    Level of consciousness: awake, alert  and patient cooperative  Complications: No apparent anesthesia complications

## 2015-04-11 ENCOUNTER — Other Ambulatory Visit: Payer: Self-pay | Admitting: *Deleted

## 2015-04-11 NOTE — Addendum Note (Signed)
Addendum  created 04/11/15 1717 by Lezlie Octave, MD   Modules edited: Anesthesia Attestations

## 2015-04-12 NOTE — Addendum Note (Signed)
Encounter addended by: Rosalita Chessman, RN on: 04/12/2015 10:32 PM<BR>     Documentation filed: PRL Based Order Sets, Orders

## 2015-04-12 NOTE — Addendum Note (Signed)
Encounter addended by: Rosalita Chessman, RN on: 04/12/2015 10:12 PM<BR>     Documentation filed: PRL Based Order Sets, Orders

## 2015-04-12 NOTE — Addendum Note (Signed)
Encounter addended by: Rosalita Chessman, RN on: 04/12/2015  9:55 PM<BR>     Documentation filed: PRL Based Order Sets, Orders

## 2015-04-13 ENCOUNTER — Encounter: Payer: Self-pay | Admitting: Anesthesiology

## 2015-04-13 ENCOUNTER — Encounter: Payer: Medicaid Other | Admitting: Anesthesiology

## 2015-04-13 ENCOUNTER — Encounter (HOSPITAL_BASED_OUTPATIENT_CLINIC_OR_DEPARTMENT_OTHER)
Admission: RE | Admit: 2015-04-13 | Discharge: 2015-04-13 | Disposition: A | Discharge status: Home / Self Care | Payer: Medicaid Other | Source: Ambulatory Visit | Attending: Psychiatry | Admitting: Psychiatry

## 2015-04-13 DIAGNOSIS — F332 Major depressive disorder, recurrent severe without psychotic features: Secondary | ICD-10-CM

## 2015-04-13 LAB — POCT PREGNANCY, URINE: Preg Test, Ur: NEGATIVE

## 2015-04-13 MED ORDER — LIDOCAINE HCL (CARDIAC) 20 MG/ML IV SOLN
4.0000 mg | Freq: Once | INTRAVENOUS | Status: AC
  Administered 2015-04-13: 4 mg via INTRAVENOUS

## 2015-04-13 MED ORDER — DEXTROSE 5 % IV SOLN
250.0000 mL | Freq: Once | INTRAVENOUS | Status: AC
  Administered 2015-04-13: 250 mL via INTRAVENOUS

## 2015-04-13 MED ORDER — KETOROLAC TROMETHAMINE 30 MG/ML IJ SOLN
30.0000 mg | Freq: Once | INTRAMUSCULAR | Status: AC
  Administered 2015-04-13: 30 mg via INTRAVENOUS

## 2015-04-13 MED ORDER — DEXTROSE 5 % IV SOLN
INTRAVENOUS | Status: DC | PRN
  Administered 2015-04-13: 12:00:00 via INTRAVENOUS

## 2015-04-13 MED ORDER — METHOHEXITAL SODIUM 100 MG/10ML IV SOSY
140.0000 mg | PREFILLED_SYRINGE | Freq: Once | INTRAVENOUS | Status: AC
Start: 2015-04-13 — End: 2015-04-13
  Administered 2015-04-13: 140 mg via INTRAVENOUS

## 2015-04-13 MED ORDER — SUCCINYLCHOLINE CHLORIDE 20 MG/ML IJ SOLN
150.0000 mg | Freq: Once | INTRAMUSCULAR | Status: AC
  Administered 2015-04-13: 150 mg via INTRAVENOUS

## 2015-04-13 NOTE — Procedures (Deleted)
ECT SERVICES Physician's Interval Evaluation & Treatment Note  60  Patient Identification: Stacey [name] MRN:  [id number] Date of Evaluation:  04/13/2015 TX #: 7 MADRS:  39  MMSE:  28 P.E. Findings:  No new physical findings.  Psychiatric Interval Note:  Mood is slightly less irritable and he is more alert and interactive  Subjective:  Patient is a 49 y.o. female seen for evaluation for Electroconvulsive Therapy. No new complaint  Treatment Summary:     Right Unilateral              Bilateral   % Energy : 1.0 ms 100%   Impedance: 370 ohms  Seizure Energy Index: 719 V squared  Postictal Suppression Index: Not red  Seizure Concordance Index: 98%  Medications  Pre Shock: Xylocaine 4 mg, labetalol 20 mg, ketamine 120 mg, succinylcholine 120 mg  Post Shock:    Seizure Duration: 26 seconds by EMG 26 seconds by EEG   Comments: No change to treatment plan. Patient is showing some gradual improvement. We are working on trying to get him more physically active. Next treatment scheduled for Friday   Lungs:    Clear to auscultation                Other:   Heart:      Regular rhythm              irregular rhythm      Previous H&P reviewed, patient examined and there are NO CHANGES                   Previous H&P reviewed, patient examined and there are changes noted.   Mordecai Rasmussen, MD 9/14/201611:58 AM

## 2015-04-13 NOTE — Anesthesia Postprocedure Evaluation (Signed)
  Anesthesia Post-op Note  Patient: Stacey [name]  Procedure(s) Performed: * No procedures listed *  Anesthesia type:General  Patient location: PACU  Post pain: Pain level controlled  Post assessment: Post-op Vital signs reviewed, Patient's Cardiovascular Status Stable, Respiratory Function Stable, Patent Airway and No signs of Nausea or vomiting  Post vital signs: Reviewed and stable  Last Vitals:  Filed Vitals:   04/13/15 1225  BP: 147/80  Pulse: 85  Temp:   Resp: 18    Level of consciousness: awake, alert  and patient cooperative  Complications: No apparent anesthesia complications

## 2015-04-13 NOTE — Anesthesia Preprocedure Evaluation (Signed)
Anesthesia Evaluation  Patient identified by MRN, date of birth, ID band Patient confused    Reviewed: Allergy & Precautions, NPO status , Patient's Chart, lab work & pertinent test results  Airway Mallampati: III       Dental no notable dental hx.    Pulmonary shortness of breath and with exertion, sleep apnea , former smoker,     + decreased breath sounds      Cardiovascular negative cardio ROS Normal cardiovascular exam     Neuro/Psych  Headaches, Anxiety Depression Bipolar Disorder    GI/Hepatic Neg liver ROS, GERD  ,  Endo/Other  diabetes, Type 2, Oral Hypoglycemic Agents  Renal/GU negative Renal ROS     Musculoskeletal   Abdominal (+) + obese,   Peds negative pediatric ROS (+)  Hematology  (+) anemia ,   Anesthesia Other Findings   Reproductive/Obstetrics                             Anesthesia Physical Anesthesia Plan  ASA: III  Anesthesia Plan: General   Post-op Pain Management:    Induction: Intravenous  Airway Management Planned: Mask  Additional Equipment:   Intra-op Plan:   Post-operative Plan:   Informed Consent: I have reviewed the patients History and Physical, chart, labs and discussed the procedure including the risks, benefits and alternatives for the proposed anesthesia with the patient or authorized representative who has indicated his/her understanding and acceptance.     Plan Discussed with: CRNA  Anesthesia Plan Comments:         Anesthesia Quick Evaluation

## 2015-04-13 NOTE — Transfer of Care (Signed)
Immediate Anesthesia Transfer of Care Note  Patient: Stacey [name]  Procedure(s) Performed: * No procedures listed *  Patient Location: PACU  Anesthesia Type:General  Level of Consciousness: sedated and responds to stimulation  Airway & Oxygen Therapy: Patient Spontanous Breathing and Patient connected to face mask oxygen  Post-op Assessment: Report given to RN and Post -op Vital signs reviewed and stable  Post vital signs: Reviewed and stable  Last Vitals:  Filed Vitals:   04/13/15 1153  BP: 149/90  Pulse: 72  Temp: 37.4 C  Resp: 18    Complications: No apparent anesthesia complications

## 2015-04-13 NOTE — Procedures (Signed)
ECT SERVICES Physician's Interval Evaluation & Treatment Note  Patient Identification: Stacey [name] MRN:  [id number] Date of Evaluation:  04/13/2015 TX #: 9  MADRS:   MMSE:   P.E. Findings:  No new physical complaints or findings  Psychiatric Interval Note:  Mood continues to feel improved with no decline  Subjective:  Patient is a 49 y.o. female seen for evaluation for Electroconvulsive Therapy. No new complaints  Treatment Summary:     Right Unilateral              Bilateral   % Energy : 0.3 ms 100%   Impedance: 880 ohms  Seizure Energy Index: 10,568 V squared  Postictal Suppression Index: 85%  Seizure Concordance Index: 94%  Medications  Pre Shock: Xylocaine 4 mg, Toradol 30 mg, Brevital 140 mg, succinylcholine 150 mg  Post Shock: None  Seizure Duration: 9 seconds by EMG, 19 seconds by EEG   Comments: Follow-up one week for continued maintenance plan   Lungs:    Clear to auscultation                Other:   Heart:      Regular rhythm              irregular rhythm      Previous H&P reviewed, patient examined and there are NO CHANGES                   Previous H&P reviewed, patient examined and there are changes noted.   Mordecai Rasmussen, MD 9/14/201611:38 AM

## 2015-04-14 NOTE — Addendum Note (Signed)
Encounter addended by: Rosalita Chessman, RN on: 04/14/2015  4:22 PM<BR>     Documentation filed: PRL Based Order Sets, Orders

## 2015-04-18 ENCOUNTER — Other Ambulatory Visit: Payer: Self-pay | Admitting: *Deleted

## 2015-04-20 ENCOUNTER — Encounter: Payer: Self-pay | Admitting: Anesthesiology

## 2015-04-20 ENCOUNTER — Ambulatory Visit
Admission: RE | Admit: 2015-04-20 | Discharge: 2015-04-20 | Disposition: A | Discharge status: Home / Self Care | Payer: Medicaid Other | Source: Ambulatory Visit | Attending: Psychiatry | Admitting: Psychiatry

## 2015-04-20 ENCOUNTER — Encounter: Payer: Medicaid Other | Admitting: Anesthesiology

## 2015-04-20 DIAGNOSIS — Z803 Family history of malignant neoplasm of breast: Secondary | ICD-10-CM | POA: Diagnosis not present

## 2015-04-20 DIAGNOSIS — F419 Anxiety disorder, unspecified: Secondary | ICD-10-CM | POA: Diagnosis not present

## 2015-04-20 DIAGNOSIS — Z820 Family history of epilepsy and other diseases of the nervous system: Secondary | ICD-10-CM | POA: Diagnosis not present

## 2015-04-20 DIAGNOSIS — J45909 Unspecified asthma, uncomplicated: Secondary | ICD-10-CM | POA: Diagnosis not present

## 2015-04-20 DIAGNOSIS — Z825 Family history of asthma and other chronic lower respiratory diseases: Secondary | ICD-10-CM | POA: Diagnosis not present

## 2015-04-20 DIAGNOSIS — Z833 Family history of diabetes mellitus: Secondary | ICD-10-CM | POA: Insufficient documentation

## 2015-04-20 DIAGNOSIS — E041 Nontoxic single thyroid nodule: Secondary | ICD-10-CM | POA: Insufficient documentation

## 2015-04-20 DIAGNOSIS — Z8489 Family history of other specified conditions: Secondary | ICD-10-CM | POA: Insufficient documentation

## 2015-04-20 DIAGNOSIS — Z818 Family history of other mental and behavioral disorders: Secondary | ICD-10-CM | POA: Diagnosis not present

## 2015-04-20 DIAGNOSIS — F332 Major depressive disorder, recurrent severe without psychotic features: Secondary | ICD-10-CM | POA: Diagnosis present

## 2015-04-20 DIAGNOSIS — G4733 Obstructive sleep apnea (adult) (pediatric): Secondary | ICD-10-CM | POA: Insufficient documentation

## 2015-04-20 MED ORDER — METHOHEXITAL SODIUM 100 MG/10ML IV SOSY
140.0000 mg | PREFILLED_SYRINGE | Freq: Once | INTRAVENOUS | Status: AC
  Administered 2015-04-20: 140 mg via INTRAVENOUS

## 2015-04-20 MED ORDER — SUCCINYLCHOLINE CHLORIDE 20 MG/ML IJ SOLN
150.0000 mg | Freq: Once | INTRAMUSCULAR | Status: AC
  Administered 2015-04-20: 150 mg via INTRAVENOUS

## 2015-04-20 MED ORDER — KETOROLAC TROMETHAMINE 30 MG/ML IJ SOLN
30.0000 mg | Freq: Once | INTRAMUSCULAR | Status: AC
  Administered 2015-04-20: 30 mg via INTRAVENOUS

## 2015-04-20 MED ORDER — DEXTROSE 5 % IV SOLN
250.0000 mL | Freq: Once | INTRAVENOUS | Status: AC
  Administered 2015-04-20: 250 mL via INTRAVENOUS

## 2015-04-20 MED ORDER — DEXTROSE 5 % IV SOLN
INTRAVENOUS | Status: DC | PRN
  Administered 2015-04-20: 12:00:00 via INTRAVENOUS

## 2015-04-20 MED ORDER — LIDOCAINE HCL (CARDIAC) 20 MG/ML IV SOLN
4.0000 mg | Freq: Once | INTRAVENOUS | Status: AC
  Administered 2015-04-20: 4 mg via INTRAVENOUS

## 2015-04-20 NOTE — Procedures (Signed)
ECT SERVICES Physician's Interval Evaluation & Treatment Note  Patient Identification: Stacey [name] MRN:  [id number] Date of Evaluation:  04/20/2015 TX #:  10  MADRS:   MMSE:   P.E. Findings:   no change to physical exam  Psychiatric Interval Note:   mood continues to be stable no worsening of mood. No return of any serious memory problems  Subjective:  Patient is a 49 y.o. female seen for evaluation for Electroconvulsive Therapy.  no new complaint  Treatment Summary:     Right Unilateral              Bilateral   % Energy :  0.3 ms, 100%   Impedance:  660 ohms  Seizure Energy Index:  11,376 V squared  Postictal Suppression Index:  54%  Seizure Concordance Index:  84%  Medications  Pre Shock:  Xylocaine 4 mg, Toradol 30 mg, Brevital 140 mg, succinylcholine 150 mg  Post Shock:  none  Seizure Duration:  9 seconds by EMG, 25 seconds by EEG   Comments:  follow-up 2 weeks on October 5   Lungs:    Clear to auscultation                Other:   Heart:      Regular rhythm              irregular rhythm      Previous H&P reviewed, patient examined and there are NO CHANGES                   Previous H&P reviewed, patient examined and there are changes noted.   Mordecai Rasmussen, MD 9/21/201611:37 AM

## 2015-04-20 NOTE — H&P (Signed)
Stacey [name] is an 49 y.o. female.   Chief Complaint: ECT for treatment of recurrent severe major depression HPI: No new complaints. Symptoms stable since last week. No change in cognitive function  Past Medical History  Diagnosis Date  . Asthma   . Allergic rhinitis, cause unspecified   . Bipolar 1 disorder   . Morbid obesity   . Thyroid nodule   . Mental disorder   . 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 Pt has never been tested.   . Anxiety   . OSA (obstructive sleep apnea)     uses cpap does not know settings  . Difficult intravenous access     hard stick for ivs and lab per pt  . Nasal bone fracture age 66    left side cannot place tube in nostril per pt  . Iron deficiency anemia, unspecified 02/24/2013  . Pernicious anemia 02/24/2013  . Anxiety   . Complication of anesthesia 2004    bp dropped after c section, took 24hrs to feels leg , numbing medicine does not last long when injected into skin  . Family history of adverse reaction to anesthesia     mom heart stopped times 2 in or  . Diabetes mellitus without complication     pre diabetic  . GERD (gastroesophageal reflux disease)   . Headache   . Fibromyalgia     Past Surgical History  Procedure Laterality Date  . Tubal ligation    . Cesarean section    . Noraplant    . Wisdom tooth extraction    . Esophagogastroduodenoscopy N/A 01/02/2013    Procedure: ESOPHAGOGASTRODUODENOSCOPY (EGD);  Surgeon: Louis Meckel, MD;  Location: [facility];  Service: Endoscopy;  Laterality: N/A;  . Colonoscopy with propofol N/A 01/02/2013    Procedure: COLONOSCOPY WITH PROPOFOL;  Surgeon: Louis Meckel, MD;  Location: [facility];  Service: Endoscopy;  Laterality: N/A;    Family History  Problem Relation Age of Onset  . Breast cancer Mother   . Asthma Mother   . Bipolar disorder Mother   . Parkinson's disease Mother   . Emphysema Paternal Grandmother   . Diabetes Father    Social History:  reports that she quit  smoking about 8 years ago. Her smoking use included Cigarettes. She started smoking about 28 years ago. She has a 10 pack-year smoking history. She has never used smokeless tobacco. She reports that she drinks alcohol. She reports that she does not use illicit drugs.  Allergies:  Allergies  Allergen Reactions  . Bee Venom   . Bupropion Hcl Other (See Comments)    swelling in hands  . Capzasin [Capsaicin] Other (See Comments)    Red and burning  . Citalopram Hydrobromide Other (See Comments)    Doesn't remember reaction  . Other Other (See Comments)    Bleach cause vocal cord to close  . Strawberry Other (See Comments)    Caused facial swelling and rash  . Lamictal [Lamotrigine] Rash  . Latex Other (See Comments)    Blisters where the latex touched it     (Not in a hospital admission)  No results found for this or any previous visit (from the past 48 hour(s)). No results found.  Review of Systems  Constitutional: Negative.   HENT: Negative.   Eyes: Negative.   Respiratory: Negative.   Cardiovascular: Negative.   Gastrointestinal: Negative.   Musculoskeletal: Negative.   Skin: Negative.   Neurological: Negative.   Psychiatric/Behavioral: Negative.  All other systems reviewed and are negative.   Blood pressure 126/62, pulse 87, temperature 98.1 F (36.7 C), temperature source Oral, resp. rate 18, weight 136.986 kg (302 lb), last menstrual period 04/13/2015, SpO2 94 %. Physical Exam  Nursing note and vitals reviewed. Constitutional: She appears well-developed and well-nourished.  HENT:  Head: Normocephalic and atraumatic.  Eyes: Conjunctivae are normal. Pupils are equal, round, and reactive to light.  Neck: Normal range of motion. Neck supple.  Cardiovascular: Normal rate, regular rhythm and normal heart sounds.   Respiratory: Effort normal and breath sounds normal. No respiratory distress. She has no wheezes. She has no rales. She exhibits no tenderness.  GI: Soft.  Bowel sounds are normal.  Musculoskeletal: Normal range of motion.  Neurological: She is alert.  Skin: Skin is warm and dry.  Psychiatric: She has a normal mood and affect. Her behavior is normal. Judgment and thought content normal.     Assessment/Plan ECT treatment today as part of maintenance with anticipated follow-up in 2 weeks  [name] 04/20/2015, 10:09 AM

## 2015-04-20 NOTE — Anesthesia Preprocedure Evaluation (Signed)
Anesthesia Evaluation  Patient identified by MRN, date of birth, ID band Patient confused    Reviewed: Allergy & Precautions, NPO status , Patient's Chart, lab work & pertinent test results  History of Anesthesia Complications (+) Family history of anesthesia reaction and history of anesthetic complications  Airway Mallampati: III       Dental no notable dental hx.    Pulmonary shortness of breath and with exertion, asthma , sleep apnea , former smoker,     + decreased breath sounds      Cardiovascular negative cardio ROS Normal cardiovascular exam     Neuro/Psych  Headaches, PSYCHIATRIC DISORDERS Anxiety Depression Bipolar Disorder  Neuromuscular disease    GI/Hepatic Neg liver ROS, GERD  ,  Endo/Other  diabetes, Type 2, Oral Hypoglycemic Agents  Renal/GU negative Renal ROS     Musculoskeletal  (+) Fibromyalgia -  Abdominal (+) + obese,   Peds negative pediatric ROS (+)  Hematology  (+) anemia ,   Anesthesia Other Findings Past Medical History:   Asthma                                                       Allergic rhinitis, cause unspecified                         Bipolar 1 disorder                                           Morbid obesity                                               Thyroid nodule                                               Mental disorder                                              Beta thalassemia minor                          Pt has a *   Anxiety                                                      OSA (obstructive sleep apnea)                                  Comment:uses cpap does not know settings   Difficult intravenous access  Comment:hard stick for ivs and lab per pt   Nasal bone fracture                             age 49          Comment:left side cannot place tube in nostril per pt   Iron deficiency anemia, unspecified             02/24/2013     Pernicious anemia                               02/24/2013    Anxiety                                                      Complication of anesthesia                      2004           Comment:bp dropped after c section, took 24hrs to feels              leg , numbing medicine does not last long when               injected into skin   Family history of adverse reaction to anesthes*                Comment:mom heart stopped times 2 in or   Diabetes mellitus without complication                         Comment:pre diabetic   GERD (gastroesophageal reflux disease)                       Headache                                                     Fibromyalgia                                                 Reproductive/Obstetrics                             Anesthesia Physical  Anesthesia Plan  ASA: III  Anesthesia Plan: General   Post-op Pain Management:    Induction: Intravenous  Airway Management Planned: Mask  Additional Equipment:   Intra-op Plan:   Post-operative Plan:   Informed Consent: I have reviewed the patients History and Physical, chart, labs and discussed the procedure including the risks, benefits and alternatives for the proposed anesthesia with the patient or authorized representative who has indicated his/her understanding and acceptance.     Plan Discussed with: CRNA  Anesthesia Plan Comments:         Anesthesia Quick Evaluation

## 2015-04-20 NOTE — Anesthesia Postprocedure Evaluation (Signed)
  Anesthesia Post-op Note  Patient: Stacey [name]  Procedure(s) Performed: * No procedures listed *  Anesthesia type:General  Patient location: PACU  Post pain: Pain level controlled  Post assessment: Post-op Vital signs reviewed, Patient's Cardiovascular Status Stable, Respiratory Function Stable, Patent Airway and No signs of Nausea or vomiting  Post vital signs: Reviewed and stable  Last Vitals:  Filed Vitals:   04/20/15 1236  BP: 118/68  Pulse: 88  Temp:   Resp: 20    Level of consciousness: awake, alert  and patient cooperative  Complications: No apparent anesthesia complications

## 2015-04-20 NOTE — Transfer of Care (Signed)
Immediate Anesthesia Transfer of Care Note  Patient: Stacey [name]  Procedure(s) Performed: ECT  Patient Location: PACU  Anesthesia Type:General  Level of Consciousness: awake  Airway & Oxygen Therapy: Patient Spontanous Breathing and Patient connected to face mask oxygen  Post-op Assessment: Report given to RN and Post -op Vital signs reviewed and stable  Post vital signs: Reviewed and stable  Last Vitals:  Filed Vitals:   04/20/15 1157  BP: 143/61  Pulse: 76  Temp: 36.9 C  Resp: 22    Complications: No apparent anesthesia complications

## 2015-04-21 LAB — POCT PREGNANCY, URINE: PREG TEST UR: NEGATIVE

## 2015-05-02 ENCOUNTER — Other Ambulatory Visit: Payer: Self-pay | Admitting: *Deleted

## 2015-05-04 ENCOUNTER — Encounter: Payer: Self-pay | Admitting: Anesthesiology

## 2015-05-04 ENCOUNTER — Encounter
Admission: RE | Admit: 2015-05-04 | Discharge: 2015-05-04 | Disposition: A | Discharge status: Home / Self Care | Payer: Medicaid Other | Source: Ambulatory Visit | Attending: Psychiatry | Admitting: Psychiatry

## 2015-05-04 DIAGNOSIS — F419 Anxiety disorder, unspecified: Secondary | ICD-10-CM | POA: Diagnosis not present

## 2015-05-04 DIAGNOSIS — K219 Gastro-esophageal reflux disease without esophagitis: Secondary | ICD-10-CM | POA: Diagnosis not present

## 2015-05-04 DIAGNOSIS — F319 Bipolar disorder, unspecified: Secondary | ICD-10-CM | POA: Diagnosis not present

## 2015-05-04 DIAGNOSIS — F332 Major depressive disorder, recurrent severe without psychotic features: Secondary | ICD-10-CM | POA: Diagnosis not present

## 2015-05-04 DIAGNOSIS — E119 Type 2 diabetes mellitus without complications: Secondary | ICD-10-CM | POA: Diagnosis not present

## 2015-05-04 DIAGNOSIS — G4733 Obstructive sleep apnea (adult) (pediatric): Secondary | ICD-10-CM | POA: Diagnosis not present

## 2015-05-04 DIAGNOSIS — Z87891 Personal history of nicotine dependence: Secondary | ICD-10-CM | POA: Insufficient documentation

## 2015-05-04 DIAGNOSIS — J45909 Unspecified asthma, uncomplicated: Secondary | ICD-10-CM | POA: Insufficient documentation

## 2015-05-04 DIAGNOSIS — Z6841 Body Mass Index (BMI) 40.0 and over, adult: Secondary | ICD-10-CM | POA: Diagnosis not present

## 2015-05-04 MED ORDER — SUCCINYLCHOLINE CHLORIDE 20 MG/ML IJ SOLN
150.0000 mg | Freq: Once | INTRAMUSCULAR | Status: AC
  Administered 2015-05-04: 150 mg via INTRAVENOUS

## 2015-05-04 MED ORDER — LIDOCAINE HCL (CARDIAC) 20 MG/ML IV SOLN
4.0000 mg | Freq: Once | INTRAVENOUS | Status: AC
  Administered 2015-05-04: 4 mg via INTRAVENOUS

## 2015-05-04 MED ORDER — KETOROLAC TROMETHAMINE 30 MG/ML IJ SOLN
30.0000 mg | Freq: Once | INTRAMUSCULAR | Status: AC
  Administered 2015-05-04: 30 mg via INTRAVENOUS

## 2015-05-04 MED ORDER — DEXTROSE 5 % IV SOLN
250.0000 mL | Freq: Once | INTRAVENOUS | Status: AC
  Administered 2015-05-04: 250 mL via INTRAVENOUS

## 2015-05-04 MED ORDER — METHOHEXITAL SODIUM 100 MG/10ML IV SOSY
140.0000 mg | PREFILLED_SYRINGE | Freq: Once | INTRAVENOUS | Status: AC
  Administered 2015-05-04: 140 mg via INTRAVENOUS

## 2015-05-04 NOTE — Transfer of Care (Signed)
Immediate Anesthesia Transfer of Care Note  Patient: Stacey [name]  Procedure(s) Performed: * No procedures listed *  Patient Location: PACU  Anesthesia Type:General  Level of Consciousness: sedated  Airway & Oxygen Therapy: Patient Spontanous Breathing and Patient connected to face mask oxygen  Post-op Assessment: Report given to RN and Post -op Vital signs reviewed and stable  Post vital signs: Reviewed and stable  Last Vitals:  Filed Vitals:   05/04/15 1237  BP: 137/77  Pulse:   Temp:   Resp:     Complications: No apparent anesthesia complications

## 2015-05-04 NOTE — Anesthesia Preprocedure Evaluation (Signed)
Anesthesia Evaluation  Patient identified by MRN, date of birth, ID band Patient confused    Reviewed: Allergy & Precautions, NPO status , Patient's Chart, lab work & pertinent test results  History of Anesthesia Complications (+) Family history of anesthesia reaction and history of anesthetic complications  Airway Mallampati: III       Dental no notable dental hx. (+) Teeth Intact   Pulmonary shortness of breath and with exertion, asthma , sleep apnea , former smoker,     + decreased breath sounds      Cardiovascular negative cardio ROS Normal cardiovascular exam     Neuro/Psych  Headaches, PSYCHIATRIC DISORDERS Anxiety Depression Bipolar Disorder  Neuromuscular disease    GI/Hepatic Neg liver ROS, GERD  ,  Endo/Other  diabetes, Type 2, Oral Hypoglycemic Agents  Renal/GU negative Renal ROS     Musculoskeletal  (+) Fibromyalgia -  Abdominal (+) + obese,   Peds negative pediatric ROS (+)  Hematology  (+) anemia ,   Anesthesia Other Findings Past Medical History:   Asthma                                                       Allergic rhinitis, cause unspecified                         Bipolar 1 disorder                                           Morbid obesity                                               Thyroid nodule                                               Mental disorder                                              Beta thalassemia minor                          Pt has a *   Anxiety                                                      OSA (obstructive sleep apnea)                                  Comment:uses cpap does not know settings   Difficult intravenous access  Comment:hard stick for ivs and lab per pt   Nasal bone fracture                             age 49          Comment:left side cannot place tube in nostril per pt   Iron deficiency anemia, unspecified              02/24/2013    Pernicious anemia                               02/24/2013    Anxiety                                                      Complication of anesthesia                      2004           Comment:bp dropped after c section, took 24hrs to feels              leg , numbing medicine does not last long when               injected into skin   Family history of adverse reaction to anesthes*                Comment:mom heart stopped times 2 in or   Diabetes mellitus without complication                         Comment:pre diabetic   GERD (gastroesophageal reflux disease)                       Headache                                                     Fibromyalgia                                                 Reproductive/Obstetrics negative OB ROS                             Anesthesia Physical  Anesthesia Plan  ASA: III  Anesthesia Plan: General   Post-op Pain Management:    Induction: Intravenous  Airway Management Planned: Mask  Additional Equipment:   Intra-op Plan:   Post-operative Plan:   Informed Consent: I have reviewed the patients History and Physical, chart, labs and discussed the procedure including the risks, benefits and alternatives for the proposed anesthesia with the patient or authorized representative who has indicated his/her understanding and acceptance.     Plan Discussed with: CRNA, Anesthesiologist and Surgeon  Anesthesia Plan Comments:         Anesthesia Quick Evaluation

## 2015-05-04 NOTE — Procedures (Signed)
ECT SERVICES Physician's Interval Evaluation & Treatment Note  Patient Identification: Stacey [name] MRN:  [id number] Date of Evaluation:  05/04/2015 TX #: [id number]  MADRS:   MMSE:   P.E. Findings:  No change to physical exam  Psychiatric Interval Note:  Patient is reporting feeling a little bit down also having significant amount of life stress. Notices some impairment to her memory still  Subjective:  Patient is a 49 y.o. female seen for evaluation for Electroconvulsive Therapy. See note above  Treatment Summary:     Right Unilateral              Bilateral   % Energy : 0.3 ms, 100%   Impedance: 1110 ohms  Seizure Energy Index: 15,288 V squared  Postictal Suppression Index: 70%  Seizure Concordance Index: 94%  Medications  Pre Shock: Xylocaine 4 mg, Toradol 30 mg, Brevital 140 mg, succinylcholine 150 mg  Post Shock:    Seizure Duration: 6 seconds by EMG, 27 seconds by EEG   Comments: Next treatment scheduled for October 19   Lungs:    Clear to auscultation                Other:   Heart:      Regular rhythm              irregular rhythm      Previous H&P reviewed, patient examined and there are NO CHANGES                   Previous H&P reviewed, patient examined and there are changes noted.   Mordecai Rasmussen, MD 10/5/201612:16 PM

## 2015-05-04 NOTE — Anesthesia Procedure Notes (Signed)
Date/Time: 05/04/2015 12:25 PM Performed by: Henrietta Hoover Pre-anesthesia Checklist: Emergency Drugs available, Suction available, Patient identified, Patient being monitored and Timeout performed Patient Re-evaluated:Patient Re-evaluated prior to inductionOxygen Delivery Method: Circle system utilized Preoxygenation: Pre-oxygenation with 100% oxygen Intubation Type: IV induction Ventilation: Mask ventilation without difficulty Placement Confirmation: positive ETCO2 Dental Injury: Teeth and Oropharynx as per pre-operative assessment

## 2015-05-04 NOTE — H&P (Signed)
Stacey [name] is an 49 y.o. female.   Chief Complaint: Patient is now in the maintenance phase of ECT. Last seen 2 weeks ago. Reports some worsening of mood and a little bit of trouble with memory HPI: Major depression being treated with ECT  Past Medical History  Diagnosis Date  . Asthma   . Allergic rhinitis, cause unspecified   . Bipolar 1 disorder (HCC)   . Morbid obesity (HCC)   . Thyroid nodule   . Mental disorder   . 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 Pt has never been tested.   . Anxiety   . OSA (obstructive sleep apnea)     uses cpap does not know settings  . Difficult intravenous access     hard stick for ivs and lab per pt  . Nasal bone fracture age 81    left side cannot place tube in nostril per pt  . Iron deficiency anemia, unspecified 02/24/2013  . Pernicious anemia 02/24/2013  . Anxiety   . Complication of anesthesia 2004    bp dropped after c section, took 24hrs to feels leg , numbing medicine does not last long when injected into skin  . Family history of adverse reaction to anesthesia     mom heart stopped times 2 in or  . 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 GERD (gastroesophageal reflux disease)   . Headache   . Fibromyalgia     Past Surgical History  Procedure Laterality Date  . Tubal ligation    . Cesarean section    . Noraplant    . Wisdom tooth extraction    . Esophagogastroduodenoscopy N/A 01/02/2013    Procedure: ESOPHAGOGASTRODUODENOSCOPY (EGD);  Surgeon: Louis Meckel, MD;  Location: [facility];  Service: Endoscopy;  Laterality: N/A;  . Colonoscopy with propofol N/A 01/02/2013    Procedure: COLONOSCOPY WITH PROPOFOL;  Surgeon: Louis Meckel, MD;  Location: [facility];  Service: Endoscopy;  Laterality: N/A;    Family History  Problem Relation Age of Onset  . Breast cancer Mother   . Asthma Mother   . Bipolar disorder Mother   . Parkinson's disease Mother   . Emphysema Paternal Grandmother    . Diabetes Father    Social History:  reports that she quit smoking about 8 years ago. Her smoking use included Cigarettes. She started smoking about 28 years ago. She has a 10 pack-year smoking history. She has never used smokeless tobacco. She reports that she drinks alcohol. She reports that she does not use illicit drugs.  Allergies:  Allergies  Allergen Reactions  . Bee Venom   . Bupropion Hcl Other (See Comments)    swelling in hands  . Capzasin [Capsaicin] Other (See Comments)    Red and burning  . Citalopram Hydrobromide Other (See Comments)    Doesn't remember reaction  . Other Other (See Comments)    Bleach cause vocal cord to close  . Strawberry Other (See Comments)    Caused facial swelling and rash  . Lamictal [Lamotrigine] Rash  . Latex Other (See Comments)    Blisters where the latex touched it     (Not in a hospital admission)  No results found for this or any previous visit (from the past 48 hour(s)). No results found.  Review of Systems  Constitutional: Negative.   HENT: Negative.   Eyes: Negative.   Respiratory: Negative.   Cardiovascular: Negative.   Gastrointestinal: Negative.  Musculoskeletal: Negative.   Skin: Negative.   Neurological: Negative.   Psychiatric/Behavioral: Positive for depression and memory loss. Negative for suicidal ideas, hallucinations and substance abuse. The patient is not nervous/anxious and does not have insomnia.     Blood pressure 132/77, pulse 84, temperature 97.8 F (36.6 C), temperature source Tympanic, resp. rate 20, height  (1.727 m), weight 137.893 kg (304 lb), last menstrual period 04/13/2015, SpO2 96 %. Physical Exam  Nursing note and vitals reviewed. Constitutional: She appears well-developed and well-nourished.  HENT:  Head: Normocephalic and atraumatic.  Eyes: Conjunctivae are normal. Pupils are equal, round, and reactive to light.  Neck: Normal range of motion.  Cardiovascular: Normal rate, regular  rhythm and normal heart sounds.   Respiratory: Effort normal and breath sounds normal. No respiratory distress. She has no wheezes. She has no rales.  GI: Soft.  Musculoskeletal: Normal range of motion.  Neurological: She is alert.  Skin: Skin is warm and dry.  Psychiatric: Judgment and thought content normal. Her speech is delayed. She is slowed. She exhibits a depressed mood. She exhibits abnormal recent memory.     Assessment/Plan Continue with two-week schedule next return on Wednesday the 19th. Patient is to continue to follow-up with her outpatient psychiatrist  Mordecai Rasmussen 05/04/2015, 12:13 PM

## 2015-05-05 LAB — POCT PREGNANCY, URINE: PREG TEST UR: NEGATIVE

## 2015-05-05 NOTE — Anesthesia Postprocedure Evaluation (Signed)
  Anesthesia Post-op Note  Patient: Stacey [name]  Procedure(s) Performed: * No procedures listed *  Anesthesia type:General  Patient location: PACU  Post pain: Pain level controlled  Post assessment: Post-op Vital signs reviewed, Patient's Cardiovascular Status Stable, Respiratory Function Stable, Patent Airway and No signs of Nausea or vomiting  Post vital signs: Reviewed and stable  Last Vitals:  Filed Vitals:   05/04/15 1316  BP: 126/75  Pulse: 83  Temp: 36.7 C  Resp: 16    Level of consciousness: awake, alert  and patient cooperative  Complications: No apparent anesthesia complications

## 2015-05-11 ENCOUNTER — Other Ambulatory Visit: Payer: Self-pay | Admitting: *Deleted

## 2015-05-18 ENCOUNTER — Encounter: Payer: Medicaid Other | Admitting: Anesthesiology

## 2015-05-18 ENCOUNTER — Encounter
Admission: RE | Admit: 2015-05-18 | Discharge: 2015-05-18 | Disposition: A | Discharge status: Home / Self Care | Payer: Medicaid Other | Source: Ambulatory Visit | Attending: Psychiatry | Admitting: Psychiatry

## 2015-05-18 ENCOUNTER — Encounter: Payer: Self-pay | Admitting: Anesthesiology

## 2015-05-18 DIAGNOSIS — G4733 Obstructive sleep apnea (adult) (pediatric): Secondary | ICD-10-CM | POA: Diagnosis not present

## 2015-05-18 DIAGNOSIS — F332 Major depressive disorder, recurrent severe without psychotic features: Secondary | ICD-10-CM | POA: Diagnosis not present

## 2015-05-18 DIAGNOSIS — Z6841 Body Mass Index (BMI) 40.0 and over, adult: Secondary | ICD-10-CM | POA: Insufficient documentation

## 2015-05-18 DIAGNOSIS — M797 Fibromyalgia: Secondary | ICD-10-CM | POA: Insufficient documentation

## 2015-05-18 DIAGNOSIS — F419 Anxiety disorder, unspecified: Secondary | ICD-10-CM | POA: Insufficient documentation

## 2015-05-18 DIAGNOSIS — E119 Type 2 diabetes mellitus without complications: Secondary | ICD-10-CM | POA: Diagnosis not present

## 2015-05-18 DIAGNOSIS — K219 Gastro-esophageal reflux disease without esophagitis: Secondary | ICD-10-CM | POA: Insufficient documentation

## 2015-05-18 DIAGNOSIS — F319 Bipolar disorder, unspecified: Secondary | ICD-10-CM | POA: Diagnosis not present

## 2015-05-18 DIAGNOSIS — J45909 Unspecified asthma, uncomplicated: Secondary | ICD-10-CM | POA: Diagnosis not present

## 2015-05-18 LAB — POCT PREGNANCY, URINE: PREG TEST UR: NEGATIVE

## 2015-05-18 MED ORDER — ONDANSETRON HCL 4 MG/2ML IJ SOLN: 4.0000 mg | Freq: Once | INTRAMUSCULAR | Status: DC | PRN

## 2015-05-18 MED ORDER — FENTANYL CITRATE (PF) 100 MCG/2ML IJ SOLN: 25.0000 ug | INTRAMUSCULAR | Status: DC | PRN

## 2015-05-18 MED ORDER — LIDOCAINE HCL (CARDIAC) 20 MG/ML IV SOLN
4.0000 mg | Freq: Once | INTRAVENOUS | Status: AC
  Administered 2015-05-18: 4 mg via INTRAVENOUS

## 2015-05-18 MED ORDER — METHOHEXITAL SODIUM 100 MG/10ML IV SOSY
140.0000 mg | PREFILLED_SYRINGE | Freq: Once | INTRAVENOUS | Status: AC
  Administered 2015-05-18: 140 mg via INTRAVENOUS

## 2015-05-18 MED ORDER — KETOROLAC TROMETHAMINE 30 MG/ML IJ SOLN
30.0000 mg | Freq: Once | INTRAMUSCULAR | Status: AC
  Administered 2015-05-18: 30 mg via INTRAVENOUS

## 2015-05-18 MED ORDER — SUCCINYLCHOLINE CHLORIDE 20 MG/ML IJ SOLN
150.0000 mg | Freq: Once | INTRAMUSCULAR | Status: AC
  Administered 2015-05-18: 150 mg via INTRAVENOUS

## 2015-05-18 MED ORDER — DEXTROSE 5 % IV SOLN
INTRAVENOUS | Status: DC | PRN
  Administered 2015-05-18: 12:00:00 via INTRAVENOUS

## 2015-05-18 MED ORDER — DEXTROSE 5 % IV SOLN
250.0000 mL | Freq: Once | INTRAVENOUS | Status: AC
  Administered 2015-05-18: 250 mL via INTRAVENOUS

## 2015-05-18 NOTE — H&P (Signed)
Stacey [name] is an 49 y.o. female.   Chief Complaint: Mood continues to be improved. Continues to have some fatigue. 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 Asthma   . Allergic rhinitis, cause unspecified   . Bipolar 1 disorder (HCC)   . Morbid obesity (HCC)   . Thyroid nodule   . Mental disorder   . 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 Pt has never been tested.   . Anxiety   . OSA (obstructive sleep apnea)     uses cpap does not know settings  . Difficult intravenous access     hard stick for ivs and lab per pt  . Nasal bone fracture age 75    left side cannot place tube in nostril per pt  . Iron deficiency anemia, unspecified 02/24/2013  . Pernicious anemia 02/24/2013  . Anxiety   . Complication of anesthesia 2004    bp dropped after c section, took 24hrs to feels leg , numbing medicine does not last long when injected into skin  . Family history of adverse reaction to anesthesia     mom heart stopped times 2 in or  . 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 GERD (gastroesophageal reflux disease)   . Headache   . Fibromyalgia     Past Surgical History  Procedure Laterality Date  . Tubal ligation    . Cesarean section    . Noraplant    . Wisdom tooth extraction    . Esophagogastroduodenoscopy N/A 01/02/2013    Procedure: ESOPHAGOGASTRODUODENOSCOPY (EGD);  Surgeon: Louis Meckelobert D Kaplan, MD;  Location: [facility];  Service: Endoscopy;  Laterality: N/A;  . Colonoscopy with propofol N/A 01/02/2013    Procedure: COLONOSCOPY WITH PROPOFOL;  Surgeon: Louis Meckelobert D Kaplan, MD;  Location: [facility];  Service: Endoscopy;  Laterality: N/A;  . Tubal ligation Bilateral     Family History  Problem Relation Age of Onset  . Breast cancer Mother   . Asthma Mother   . Bipolar disorder Mother   . Parkinson's disease Mother    . Emphysema Paternal Grandmother   . Diabetes Father    Social History:  reports that she quit smoking about 8 years ago. Her smoking use included Cigarettes. She started smoking about 28 years ago. She has a 10 pack-year smoking history. She has never used smokeless tobacco. She reports that she drinks alcohol. She reports that she does not use illicit drugs.  Allergies:  Allergies  Allergen Reactions  . Bee Venom   . Bupropion Hcl Other (See Comments)    swelling in hands  . Capzasin [Capsaicin] Other (See Comments)    Red and burning  . Citalopram Hydrobromide Other (See Comments)    Doesn't remember reaction  . Other Other (See Comments)    Bleach cause vocal cord to close  . Strawberry Extract Other (See Comments)    Caused facial swelling and rash  . Lamictal [Lamotrigine] Rash  . Latex Other (See Comments)    Blisters where the latex touched it     (Not in a hospital admission)  Results for orders placed or performed during the hospital encounter of 05/18/15 (from the past 48 hour(s))  Pregnancy, urine POC     Status: None   Collection Time: 05/18/15 10:57 AM  Result Value Ref Range   Preg Test,  Ur NEGATIVE NEGATIVE    Comment:        THE SENSITIVITY OF THIS METHODOLOGY IS >24 mIU/mL    No results found.  Review of Systems  Constitutional: Positive for malaise/fatigue.  HENT: Negative.   Eyes: Negative.   Respiratory: Negative.   Cardiovascular: Negative.   Gastrointestinal: Negative.   Musculoskeletal: Negative.   Skin: Negative.   Neurological: Negative.   Psychiatric/Behavioral: Positive for memory loss. Negative for depression, suicidal ideas, hallucinations and substance abuse. The patient is not nervous/anxious and does not have insomnia.     Blood pressure 116/74, pulse 88, temperature 98.3 F (36.8 C), temperature source Tympanic, resp. rate 16, height  (1.727 m), weight 137.893 kg (304 lb), last menstrual period 05/13/2015. Physical Exam   Nursing note and vitals reviewed. Constitutional: She appears well-developed and well-nourished.  HENT:  Head: Normocephalic and atraumatic.  Eyes: Conjunctivae are normal. Pupils are equal, round, and reactive to light.  Neck: Normal range of motion.  Cardiovascular: Normal rate, regular rhythm and normal heart sounds.   No murmur heard. Respiratory: Effort normal and breath sounds normal. No respiratory distress. She has no wheezes.  GI: Soft.  Musculoskeletal: Normal range of motion.  Neurological: She is alert.  Skin: Skin is warm and dry.  Psychiatric: She has a normal mood and affect. Her behavior is normal. Judgment and thought content normal.     Assessment/Plan Follow-up will be in 2 weeks at which point we will reassess for possible lengthening out of the interval  Stacey [name] 05/18/2015, 11:42 AM

## 2015-05-18 NOTE — Procedures (Signed)
ECT SERVICES Physician's Interval Evaluation & Treatment Note  Patient Identification: Stacey [name] MRN:  [id number] Date of Evaluation:  05/18/2015 TX #: 12  MADRS:   MMSE:   P.E. Findings:  No change to physical exam  Psychiatric Interval Note:  Mood continues to be improved but she is fatigued and has some memory impairment  Subjective:  Patient is a 49 y.o. female seen for evaluation for Electroconvulsive Therapy. No specific complaint  Treatment Summary:   [x]   Right Unilateral             []  Bilateral   % Energy : 0.3 ms 100%   Impedance: 700 ohms  Seizure Energy Index: 7470 V squared  Postictal Suppression Index: 78%  Seizure Concordance Index: 83%  Medications  Pre Shock: Xylocaine 4 mg, Toradol 30 mg, Brevital 140 mg, succinylcholine 150 mg  Post Shock: None  Seizure Duration: 6 seconds by EMG, 25 seconds by EEG   Comments: Follow-up 2 weeks on November 2   Lungs:  [x]   Clear to auscultation               []  Other:   Heart:    [x]   Regular rhythm             []  irregular rhythm    [x]   Previous H&P reviewed, patient examined and there are NO CHANGES                 []   Previous H&P reviewed, patient examined and there are changes noted.   Mordecai RasmussenJohn Clapacs, MD 10/19/201611:44 AM

## 2015-05-18 NOTE — Anesthesia Postprocedure Evaluation (Signed)
  Anesthesia Post-op Note  Patient: Stacey CoonsLorianne F Wojdyla  Procedure(s) Performed: * No procedures listed *  Anesthesia type:General  Patient location: PACU  Post pain: Pain level controlled  Post assessment: Post-op Vital signs reviewed, Patient's Cardiovascular Status Stable, Respiratory Function Stable, Patent Airway and No signs of Nausea or vomiting  Post vital signs: Reviewed and stable  Last Vitals:  Filed Vitals:   05/18/15 1239  BP: 141/89  Pulse: 72  Temp: 36.4 C  Resp: 16    Level of consciousness: awake, alert  and patient cooperative  Complications: No apparent anesthesia complications

## 2015-05-18 NOTE — Anesthesia Preprocedure Evaluation (Signed)
Anesthesia Evaluation  Patient identified by MRN, date of birth, ID band Patient confused    Reviewed: Allergy & Precautions, NPO status , Patient's Chart, lab work & pertinent test results  History of Anesthesia Complications (+) Family history of anesthesia reaction and history of anesthetic complications  Airway Mallampati: III       Dental no notable dental hx. (+) Teeth Intact   Pulmonary shortness of breath and with exertion, asthma , sleep apnea , former smoker,     + decreased breath sounds      Cardiovascular negative cardio ROS Normal cardiovascular exam     Neuro/Psych  Headaches, PSYCHIATRIC DISORDERS Anxiety Depression Bipolar Disorder  Neuromuscular disease    GI/Hepatic Neg liver ROS, GERD  Medicated and Controlled,  Endo/Other  diabetes, Type 2, Oral Hypoglycemic Agents  Renal/GU negative Renal ROS     Musculoskeletal  (+) Fibromyalgia -  Abdominal (+) + obese,   Peds negative pediatric ROS (+)  Hematology  (+) anemia ,   Anesthesia Other Findings Past Medical History:   Asthma                                                       Allergic rhinitis, cause unspecified                         Bipolar 1 disorder                                           Morbid obesity                                               Thyroid nodule                                               Mental disorder                                              Beta thalassemia minor                          Pt has a *   Anxiety                                                      OSA (obstructive sleep apnea)                                  Comment:uses cpap does not know settings   Difficult intravenous access  Comment:hard stick for ivs and lab per pt   Nasal bone fracture                             age 49          Comment:left side cannot place tube in nostril per pt   Iron deficiency  anemia, unspecified             02/24/2013    Pernicious anemia                               02/24/2013    Anxiety                                                      Complication of anesthesia                      49           Comment:bp dropped after c section, took 24hrs to feels              leg , numbing medicine does not last long when               injected into skin   Family history of adverse reaction to anesthes*                Comment:mom heart stopped times 2 in or   Diabetes mellitus without complication                         Comment:pre diabetic   GERD (gastroesophageal reflux disease)                       Headache                                                     Fibromyalgia                                                 Reproductive/Obstetrics negative OB ROS                             Anesthesia Physical  Anesthesia Plan  ASA: III  Anesthesia Plan: General   Post-op Pain Management:    Induction: Intravenous  Airway Management Planned: Mask  Additional Equipment:   Intra-op Plan:   Post-operative Plan:   Informed Consent: I have reviewed the patients History and Physical, chart, labs and discussed the procedure including the risks, benefits and alternatives for the proposed anesthesia with the patient or authorized representative who has indicated his/her understanding and acceptance.     Plan Discussed with: CRNA, Anesthesiologist and Surgeon  Anesthesia Plan Comments:         Anesthesia Quick Evaluation

## 2015-05-18 NOTE — Transfer of Care (Signed)
Immediate Anesthesia Transfer of Care Note  Patient: Stacey [name]  Procedure(s) Performed: * No procedures listed *  Patient Location: PACU  Anesthesia Type:General  Level of Consciousness: sedated  Airway & Oxygen Therapy: Patient Spontanous Breathing and Patient connected to face mask oxygen  Post-op Assessment: Report given to RN and Post -op Vital signs reviewed and stable  Post vital signs: Reviewed and stable  Last Vitals:  Filed Vitals:   05/18/15 1158  BP: 152/85  Pulse:   Temp: 36.9 C  Resp: 16    Complications: No apparent anesthesia complications

## 2015-05-25 ENCOUNTER — Other Ambulatory Visit: Payer: Self-pay | Admitting: *Deleted

## 2015-06-29 ENCOUNTER — Encounter: Payer: Self-pay | Admitting: Anesthesiology

## 2015-06-29 ENCOUNTER — Encounter
Admission: RE | Admit: 2015-06-29 | Discharge: 2015-06-29 | Disposition: A | Discharge status: Home / Self Care | Payer: Medicaid Other | Source: Ambulatory Visit | Attending: Psychiatry | Admitting: Psychiatry

## 2015-06-29 DIAGNOSIS — Z91030 Bee allergy status: Secondary | ICD-10-CM | POA: Insufficient documentation

## 2015-06-29 DIAGNOSIS — Z888 Allergy status to other drugs, medicaments and biological substances status: Secondary | ICD-10-CM | POA: Diagnosis not present

## 2015-06-29 DIAGNOSIS — F319 Bipolar disorder, unspecified: Secondary | ICD-10-CM | POA: Insufficient documentation

## 2015-06-29 DIAGNOSIS — D509 Iron deficiency anemia, unspecified: Secondary | ICD-10-CM | POA: Diagnosis not present

## 2015-06-29 DIAGNOSIS — Z6841 Body Mass Index (BMI) 40.0 and over, adult: Secondary | ICD-10-CM | POA: Diagnosis not present

## 2015-06-29 DIAGNOSIS — F332 Major depressive disorder, recurrent severe without psychotic features: Secondary | ICD-10-CM | POA: Diagnosis present

## 2015-06-29 DIAGNOSIS — D510 Vitamin B12 deficiency anemia due to intrinsic factor deficiency: Secondary | ICD-10-CM | POA: Insufficient documentation

## 2015-06-29 DIAGNOSIS — Z91018 Allergy to other foods: Secondary | ICD-10-CM | POA: Insufficient documentation

## 2015-06-29 DIAGNOSIS — J45909 Unspecified asthma, uncomplicated: Secondary | ICD-10-CM | POA: Insufficient documentation

## 2015-06-29 DIAGNOSIS — M797 Fibromyalgia: Secondary | ICD-10-CM | POA: Diagnosis not present

## 2015-06-29 DIAGNOSIS — K219 Gastro-esophageal reflux disease without esophagitis: Secondary | ICD-10-CM | POA: Diagnosis not present

## 2015-06-29 DIAGNOSIS — G4733 Obstructive sleep apnea (adult) (pediatric): Secondary | ICD-10-CM | POA: Insufficient documentation

## 2015-06-29 DIAGNOSIS — F419 Anxiety disorder, unspecified: Secondary | ICD-10-CM | POA: Insufficient documentation

## 2015-06-29 DIAGNOSIS — Z87891 Personal history of nicotine dependence: Secondary | ICD-10-CM | POA: Insufficient documentation

## 2015-06-29 DIAGNOSIS — E119 Type 2 diabetes mellitus without complications: Secondary | ICD-10-CM | POA: Insufficient documentation

## 2015-06-29 DIAGNOSIS — Z91040 Latex allergy status: Secondary | ICD-10-CM | POA: Insufficient documentation

## 2015-06-29 LAB — POCT PREGNANCY, URINE: PREG TEST UR: NEGATIVE

## 2015-06-29 MED ORDER — SUCCINYLCHOLINE CHLORIDE 20 MG/ML IJ SOLN
150.0000 mg | Freq: Once | INTRAMUSCULAR | Status: AC
  Administered 2015-06-29: 150 mg via INTRAVENOUS

## 2015-06-29 MED ORDER — DEXTROSE 5 % IV SOLN
250.0000 mL | Freq: Once | INTRAVENOUS | Status: AC
  Administered 2015-06-29: 250 mL via INTRAVENOUS

## 2015-06-29 MED ORDER — METHOHEXITAL SODIUM 100 MG/10ML IV SOSY: 140.0000 mg | PREFILLED_SYRINGE | Freq: Once | INTRAVENOUS | Status: DC

## 2015-06-29 MED ORDER — SUCCINYLCHOLINE CHLORIDE 20 MG/ML IJ SOLN: 150.0000 mg | Freq: Once | INTRAMUSCULAR | Status: DC

## 2015-06-29 MED ORDER — LIDOCAINE HCL (CARDIAC) 20 MG/ML IV SOLN: 4.0000 mg | Freq: Once | INTRAVENOUS | Status: DC

## 2015-06-29 MED ORDER — KETOROLAC TROMETHAMINE 30 MG/ML IJ SOLN: 30.0000 mg | Freq: Once | INTRAMUSCULAR | Status: DC

## 2015-06-29 MED ORDER — METHOHEXITAL SODIUM 100 MG/10ML IV SOSY
140.0000 mg | PREFILLED_SYRINGE | Freq: Once | INTRAVENOUS | Status: AC
  Administered 2015-06-29: 140 mg via INTRAVENOUS

## 2015-06-29 MED ORDER — KETOROLAC TROMETHAMINE 30 MG/ML IJ SOLN
30.0000 mg | Freq: Once | INTRAMUSCULAR | Status: AC
  Administered 2015-06-29: 30 mg via INTRAVENOUS

## 2015-06-29 MED ORDER — ONDANSETRON HCL 4 MG/2ML IJ SOLN
4.0000 mg | Freq: Once | INTRAMUSCULAR | Status: DC | PRN
Start: 2015-06-29 — End: 2015-06-30

## 2015-06-29 MED ORDER — DEXTROSE 5 % IV SOLN: 250.0000 mL | Freq: Once | INTRAVENOUS | Status: DC

## 2015-06-29 MED ORDER — LIDOCAINE HCL (CARDIAC) 20 MG/ML IV SOLN
4.0000 mg | Freq: Once | INTRAVENOUS | Status: AC
  Administered 2015-06-29: 4 mg via INTRAVENOUS

## 2015-06-29 MED ORDER — FENTANYL CITRATE (PF) 100 MCG/2ML IJ SOLN: 25.0000 ug | INTRAMUSCULAR | Status: DC | PRN

## 2015-06-29 NOTE — Anesthesia Postprocedure Evaluation (Signed)
Anesthesia Post Note  Patient: Stacey CoonsLorianne F [name]  Procedure(s) Performed: * No procedures listed *  Patient location during evaluation: PACU Anesthesia Type: General Level of consciousness: awake Pain management: pain level controlled Vital Signs Assessment: post-procedure vital signs reviewed and stable Respiratory status: spontaneous breathing Cardiovascular status: blood pressure returned to baseline Anesthetic complications: no    Last Vitals:  Filed Vitals:   06/29/15 1215 06/29/15 1223  BP: 114/59 117/71  Pulse: 72 75  Temp: 36.6 C   Resp: 13 16    Last Pain:  Filed Vitals:   06/29/15 1227  PainSc: 4                  Nemesis Rainwater S

## 2015-06-29 NOTE — Procedures (Signed)
ECT SERVICES Physician's Interval Evaluation & Treatment Note  Patient Identification: Stacey [name] MRN:  [id number] Date of Evaluation:  06/29/2015 TX #:   13  MADRS:  29   MMSE:  30  P.E. Findings:   no change to physical exam no significant findings vital signs good  Psychiatric Interval Note:   mood is feeling down more tearful more sad and more negative  Subjective:  Patient is a 49 y.o. female seen for evaluation for Electroconvulsive Therapy.  "more depressed"  Treatment Summary:   [x]   Right Unilateral             []  Bilateral   % Energy :   0.3 ms 100%   Impedance: 640 ohms  Seizure Energy Index: 18,115 V squared  Postictal Suppression Index: 68%  Seizure Concordance Index: 81%  Medications  Pre Shock: Xylocaine 4 mg, Toradol 30 mg, Brevital 140 mg succinylcholine 150 mg   Post Shock: None  Seizure Duration: 5 seconds by EMG 25 seconds by EEG   Comments: Patient reports mood is significantly worse. She had gone for over a month since last treatment. We will see her back in 3 weeks instead and she can call sooner if needed.   Lungs:  [x]   Clear to auscultation               []  Other:   Heart:    [x]   Regular rhythm             []  irregular rhythm    [x]   Previous H&P reviewed, patient examined and there are NO CHANGES                 []   Previous H&P reviewed, patient examined and there are changes noted.   Mordecai RasmussenJohn Hadasah Brugger, MD 11/30/201611:27 AM

## 2015-06-29 NOTE — Transfer of Care (Signed)
Immediate Anesthesia Transfer of Care Note  Patient: Stacey CoonsLorianne F Vandrunen  Procedure(s) Performed: ECT  Patient Location: PACU  Anesthesia Type:General  Level of Consciousness: sedated  Airway & Oxygen Therapy: Patient Spontanous Breathing and Patient connected to face mask oxygen  Post-op Assessment: Report given to RN and Post -op Vital signs reviewed and stable  Post vital signs: Reviewed and stable  Last Vitals:  Filed Vitals:   06/29/15 0926 06/29/15 1145  BP: 114/69 128/73  Pulse: 76 65  Temp: 37 C 36.8 C  Resp: 18 20    Complications: No apparent anesthesia complications

## 2015-06-29 NOTE — Anesthesia Preprocedure Evaluation (Signed)
Anesthesia Evaluation  Patient identified by MRN, date of birth, ID band Patient confused    Reviewed: Allergy & Precautions, NPO status , Patient's Chart, lab work & pertinent test results  History of Anesthesia Complications (+) Family history of anesthesia reaction and history of anesthetic complications  Airway Mallampati: III       Dental no notable dental hx. (+) Teeth Intact   Pulmonary shortness of breath and with exertion, asthma , sleep apnea , former smoker,     + decreased breath sounds      Cardiovascular negative cardio ROS Normal cardiovascular exam     Neuro/Psych  Headaches, PSYCHIATRIC DISORDERS Anxiety Depression Bipolar Disorder  Neuromuscular disease    GI/Hepatic Neg liver ROS, GERD  Medicated and Controlled,  Endo/Other  diabetes, Type 2, Oral Hypoglycemic Agents  Renal/GU negative Renal ROS     Musculoskeletal  (+) Fibromyalgia -  Abdominal (+) + obese,   Peds negative pediatric ROS (+)  Hematology  (+) anemia ,   Anesthesia Other Findings Past Medical History:   Asthma                                                       Allergic rhinitis, cause unspecified                         Bipolar 1 disorder                                           Morbid obesity                                               Thyroid nodule                                               Mental disorder                                              Beta thalassemia minor                          Pt has a *   Anxiety                                                      OSA (obstructive sleep apnea)                                  Comment:uses cpap does not know settings   Difficult intravenous access                                     Comment:hard stick for ivs and lab per pt   Nasal bone fracture                             age 49          Comment:left side cannot place tube in nostril per pt   Iron deficiency  anemia, unspecified             02/24/2013    Pernicious anemia                               02/24/2013    Anxiety                                                      Complication of anesthesia                      2004           Comment:bp dropped after c section, took 24hrs to feels              leg , numbing medicine does not last long when               injected into skin   Family history of adverse reaction to anesthes*                Comment:mom heart stopped times 2 in or   Diabetes mellitus without complication                         Comment:pre diabetic   GERD (gastroesophageal reflux disease)                       Headache                                                     Fibromyalgia                                                 Reproductive/Obstetrics negative OB ROS                             Anesthesia Physical  Anesthesia Plan  ASA: III  Anesthesia Plan: General   Post-op Pain Management:    Induction: Intravenous  Airway Management Planned: Mask  Additional Equipment:   Intra-op Plan:   Post-operative Plan:   Informed Consent: I have reviewed the patients History and Physical, chart, labs and discussed the procedure including the risks, benefits and alternatives for the proposed anesthesia with the patient or authorized representative who has indicated his/her understanding and acceptance.     Plan Discussed with: CRNA, Anesthesiologist and Surgeon  Anesthesia Plan Comments:         Anesthesia Quick Evaluation                                    Anesthesia Evaluation  Patient identified by MRN, date of birth, ID band Patient confused    Reviewed: Allergy & Precautions, NPO status , Patient's Chart, lab work & pertinent test results  History of Anesthesia Complications (+) Family history of anesthesia reaction and history of anesthetic complications  Airway Mallampati: III       Dental no notable dental  hx. (+) Teeth Intact   Pulmonary shortness of breath and with exertion, asthma , sleep apnea , former smoker,     + decreased breath sounds      Cardiovascular negative cardio ROS Normal cardiovascular exam     Neuro/Psych  Headaches, PSYCHIATRIC DISORDERS Anxiety Depression Bipolar Disorder  Neuromuscular disease    GI/Hepatic Neg liver ROS, GERD  Medicated and Controlled,  Endo/Other  diabetes, Type 2, Oral Hypoglycemic Agents  Renal/GU negative Renal ROS     Musculoskeletal  (+) Fibromyalgia -  Abdominal (+) + obese,   Peds negative pediatric ROS (+)  Hematology  (+) anemia ,   Anesthesia Other Findings Past Medical History:   Asthma                                                       Allergic rhinitis, cause unspecified                         Bipolar 1 disorder                                           Morbid obesity                                               Thyroid nodule                                               Mental disorder                                              Beta thalassemia minor                          Pt has a *   Anxiety                                                      OSA (obstructive sleep apnea)                                  Comment:uses cpap does not know settings   Difficult intravenous access                                     Comment:hard stick for ivs and lab per pt   Nasal bone fracture                             age 49          Comment:left side cannot place tube in nostril per pt   Iron deficiency anemia, unspecified             02/24/2013    Pernicious anemia                               02/24/2013    Anxiety                                                      Complication of anesthesia                      2004           Comment:bp dropped after c section, took 24hrs to feels              leg , numbing medicine does not last long when               injected into skin   Family history of adverse  reaction to anesthes*                Comment:mom heart stopped times 2 in or   Diabetes mellitus without complication                         Comment:pre diabetic   GERD (gastroesophageal reflux disease)                       Headache                                                     Fibromyalgia                                                 Reproductive/Obstetrics negative OB ROS                             Anesthesia Physical  Anesthesia Plan  ASA: III  Anesthesia Plan: General   Post-op Pain Management:    Induction: Intravenous  Airway Management Planned: Mask  Additional Equipment:   Intra-op Plan:   Post-operative Plan:   Informed Consent: I have reviewed the patients History and Physical, chart, labs and discussed the procedure including the risks, benefits and alternatives for the proposed anesthesia with the patient or authorized representative who has indicated his/her understanding and acceptance.     Plan Discussed with: CRNA, Anesthesiologist and Surgeon  Anesthesia Plan Comments:         Anesthesia Quick Evaluation

## 2015-06-29 NOTE — H&P (Signed)
Stacey [name] is an 49 y.o. female.   Chief Complaint:  Patient with severe recurrent major depression. Return of some depressive symptoms. Tearful. Down. Low energy. No active suicidal ideation no psychotic symptoms. HPI:  Patient missed last scheduled maintenance ECT treatment. Returns for maintenance ECT. No other change to medicine.  Past Medical History  Diagnosis Date  . Asthma   . Allergic rhinitis, cause unspecified   . Bipolar 1 disorder (HCC)   . Morbid obesity (HCC)   . Thyroid nodule   . Mental disorder   . 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 Pt has never been tested.   . Anxiety   . OSA (obstructive sleep apnea)     uses cpap does not know settings  . Difficult intravenous access     hard stick for ivs and lab per pt  . Nasal bone fracture age 60    left side cannot place tube in nostril per pt  . Iron deficiency anemia, unspecified 02/24/2013  . Pernicious anemia 02/24/2013  . Anxiety   . Complication of anesthesia 2004    bp dropped after c section, took 24hrs to feels leg , numbing medicine does not last long when injected into skin  . Family history of adverse reaction to anesthesia     mom heart stopped times 2 in or  . 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 GERD (gastroesophageal reflux disease)   . Headache   . Fibromyalgia     Past Surgical History  Procedure Laterality Date  . Tubal ligation    . Cesarean section    . Noraplant    . Wisdom tooth extraction    . Esophagogastroduodenoscopy N/A 01/02/2013    Procedure: ESOPHAGOGASTRODUODENOSCOPY (EGD);  Surgeon: Louis Meckelobert D Kaplan, MD;  [facility];  Service: Endoscopy;  Laterality: N/A;  . Colonoscopy with propofol N/A 01/02/2013    Procedure: COLONOSCOPY WITH PROPOFOL;  Surgeon: Louis Meckelobert D Kaplan, MD;  Location: [facility];  Service: Endoscopy;  Laterality: N/A;  . Tubal ligation Bilateral     Family History  Problem Relation Age of Onset  . Breast cancer  Mother   . Asthma Mother   . Bipolar disorder Mother   . Parkinson's disease Mother   . Emphysema Paternal Grandmother   . Diabetes Father    Social History:  reports that she quit smoking about 8 years ago. Her smoking use included Cigarettes. She started smoking about 28 years ago. She has a 10 pack-year smoking history. She has never used smokeless tobacco. She reports that she drinks alcohol. She reports that she does not use illicit drugs.  Allergies:  Allergies  Allergen Reactions  . Bee Venom   . Bupropion Hcl Other (See Comments)    swelling in hands  . Capzasin [Capsaicin] Other (See Comments)    Red and burning  . Citalopram Hydrobromide Other (See Comments)    Doesn't remember reaction  . Other Other (See Comments)    Bleach cause vocal cord to close  . Strawberry Extract Other (See Comments)    Caused facial swelling and rash  . Lamictal [Lamotrigine] Rash  . Latex Other (See Comments)    Blisters where the latex touched it     (Not in a hospital admission)  Results for orders placed or performed during the hospital encounter of 06/29/15 (from the past 48 hour(s))  Pregnancy, urine POC     Status: None   Collection Time: 06/29/15  9:26 AM  Result Value Ref Range   Preg Test, Ur NEGATIVE NEGATIVE    Comment:        THE SENSITIVITY OF THIS METHODOLOGY IS >24 mIU/mL    No results found.  Review of Systems  Constitutional: Negative.   HENT: Negative.   Eyes: Negative.   Respiratory: Negative.   Cardiovascular: Negative.   Gastrointestinal: Negative.   Musculoskeletal: Negative.   Skin: Negative.   Neurological: Negative.   Psychiatric/Behavioral: Positive for depression. Negative for suicidal ideas, hallucinations, memory loss and substance abuse. The patient is nervous/anxious and has insomnia.     Blood pressure 114/69, pulse 76, temperature 98.6 F (37 C), temperature source Tympanic, resp. rate 18, height 5' 8.5" (1.74 m), weight 128.822 kg (284  lb), last menstrual period 06/01/2015, SpO2 96 %. Physical Exam  Nursing note and vitals reviewed. Constitutional: She appears well-developed and well-nourished.  HENT:  Head: Normocephalic and atraumatic.  Eyes: Conjunctivae are normal. Pupils are equal, round, and reactive to light.  Neck: Normal range of motion.  Cardiovascular: Normal rate, regular rhythm and normal heart sounds.   Respiratory: Effort normal and breath sounds normal. No respiratory distress.  GI: Soft.  Musculoskeletal: Normal range of motion.  Neurological: She is alert.  Skin: Skin is warm and dry.  Psychiatric: Judgment and thought content normal. Her speech is delayed. She is slowed. Cognition and memory are normal. She exhibits a depressed mood.     Assessment/Plan  patient will received ECT treatment today and we will schedule follow-up in 3 weeks she can call us sooner if she is feeling worse. She agrees to plan. Continue outpatient medication as currently prescribed by her primary psychiatrist  Mordecai Rasmussen 06/29/2015, 11:25 AM

## 2015-06-29 NOTE — Anesthesia Procedure Notes (Signed)
Date/Time: 06/29/2015 11:34 AM Performed by: Lily KocherPERALTA, Kino Dunsworth Pre-anesthesia Checklist: Patient identified, Emergency Drugs available, Suction available and Patient being monitored Patient Re-evaluated:Patient Re-evaluated prior to inductionOxygen Delivery Method: Circle system utilized Preoxygenation: Pre-oxygenation with 100% oxygen Intubation Type: IV induction Ventilation: Mask ventilation without difficulty and Mask ventilation throughout procedure Airway Equipment and Method: Bite block Placement Confirmation: positive ETCO2 Dental Injury: Teeth and Oropharynx as per pre-operative assessment

## 2015-08-24 ENCOUNTER — Encounter: Payer: Self-pay | Admitting: Anesthesiology

## 2015-08-24 ENCOUNTER — Encounter
Admission: RE | Admit: 2015-08-24 | Discharge: 2015-08-24 | Disposition: A | Discharge status: Home / Self Care | Payer: Medicaid Other | Source: Ambulatory Visit | Attending: Psychiatry | Admitting: Psychiatry

## 2015-08-24 ENCOUNTER — Encounter: Payer: Medicaid Other | Admitting: Anesthesiology

## 2015-08-24 DIAGNOSIS — D510 Vitamin B12 deficiency anemia due to intrinsic factor deficiency: Secondary | ICD-10-CM | POA: Diagnosis not present

## 2015-08-24 DIAGNOSIS — Z91040 Latex allergy status: Secondary | ICD-10-CM | POA: Diagnosis not present

## 2015-08-24 DIAGNOSIS — Z87891 Personal history of nicotine dependence: Secondary | ICD-10-CM | POA: Insufficient documentation

## 2015-08-24 DIAGNOSIS — K219 Gastro-esophageal reflux disease without esophagitis: Secondary | ICD-10-CM | POA: Diagnosis not present

## 2015-08-24 DIAGNOSIS — M797 Fibromyalgia: Secondary | ICD-10-CM | POA: Insufficient documentation

## 2015-08-24 DIAGNOSIS — G4733 Obstructive sleep apnea (adult) (pediatric): Secondary | ICD-10-CM | POA: Diagnosis not present

## 2015-08-24 DIAGNOSIS — F332 Major depressive disorder, recurrent severe without psychotic features: Secondary | ICD-10-CM | POA: Diagnosis not present

## 2015-08-24 DIAGNOSIS — E119 Type 2 diabetes mellitus without complications: Secondary | ICD-10-CM | POA: Diagnosis not present

## 2015-08-24 DIAGNOSIS — F319 Bipolar disorder, unspecified: Secondary | ICD-10-CM | POA: Diagnosis not present

## 2015-08-24 DIAGNOSIS — Z9109 Other allergy status, other than to drugs and biological substances: Secondary | ICD-10-CM | POA: Diagnosis not present

## 2015-08-24 DIAGNOSIS — J45909 Unspecified asthma, uncomplicated: Secondary | ICD-10-CM | POA: Insufficient documentation

## 2015-08-24 DIAGNOSIS — Z888 Allergy status to other drugs, medicaments and biological substances status: Secondary | ICD-10-CM | POA: Diagnosis not present

## 2015-08-24 DIAGNOSIS — Z91030 Bee allergy status: Secondary | ICD-10-CM | POA: Insufficient documentation

## 2015-08-24 DIAGNOSIS — F419 Anxiety disorder, unspecified: Secondary | ICD-10-CM | POA: Insufficient documentation

## 2015-08-24 DIAGNOSIS — Z91018 Allergy to other foods: Secondary | ICD-10-CM | POA: Insufficient documentation

## 2015-08-24 LAB — POCT PREGNANCY, URINE: Preg Test, Ur: NEGATIVE

## 2015-08-24 MED ORDER — SUCCINYLCHOLINE 20MG/ML (10ML) SYRINGE FOR MEDFUSION PUMP - OPTIME
INTRAMUSCULAR | Status: DC | PRN
  Administered 2015-08-24: 150 mg via INTRAVENOUS

## 2015-08-24 MED ORDER — DEXTROSE 5 % IV SOLN
250.0000 mL | Freq: Once | INTRAVENOUS | Status: AC
  Administered 2015-08-24: 250 mL via INTRAVENOUS

## 2015-08-24 MED ORDER — DEXTROSE 5 % IV SOLN
INTRAVENOUS | Status: DC | PRN
  Administered 2015-08-24 (×2): via INTRAVENOUS

## 2015-08-24 MED ORDER — KETOROLAC TROMETHAMINE 30 MG/ML IJ SOLN
30.0000 mg | Freq: Once | INTRAMUSCULAR | Status: AC
  Administered 2015-08-24: 30 mg via INTRAVENOUS

## 2015-08-24 MED ORDER — METHOHEXITAL SODIUM 100 MG/10ML IV SOSY
PREFILLED_SYRINGE | INTRAVENOUS | Status: DC | PRN
  Administered 2015-08-24: 140 mg via INTRAVENOUS

## 2015-08-24 NOTE — Anesthesia Procedure Notes (Signed)
Date/Time: 08/24/2015 11:00 AM Performed by: Lily Kocher Pre-anesthesia Checklist: Patient identified, Emergency Drugs available, Suction available and Patient being monitored Patient Re-evaluated:Patient Re-evaluated prior to inductionOxygen Delivery Method: Circle system utilized Preoxygenation: Pre-oxygenation with 100% oxygen Intubation Type: IV induction Ventilation: Mask ventilation without difficulty and Mask ventilation throughout procedure Airway Equipment and Method: Bite block Placement Confirmation: positive ETCO2 Dental Injury: Teeth and Oropharynx as per pre-operative assessment

## 2015-08-24 NOTE — Transfer of Care (Signed)
Immediate Anesthesia Transfer of Care Note  Patient: Stacey [name]  Procedure(s) Performed: ECT  Patient Location: PACU  Anesthesia Type:General  Level of Consciousness: sedated  Airway & Oxygen Therapy: Patient Spontanous Breathing and Patient connected to face mask oxygen  Post-op Assessment: Report given to RN  Post vital signs: Reviewed and stable  Last Vitals:  Filed Vitals:   08/24/15 0908 08/24/15 1111  BP: 102/71 134/79  Pulse: 72 63  Temp: 35.9 C 37.1 C  Resp: 18 31    Complications: No apparent anesthesia complications

## 2015-08-24 NOTE — H&P (Signed)
Stacey [name] is an 50 y.o. female.   Chief Complaint: Patient is feeling more depressed and down. Mood worse. Speculates that it may be the time of the year. No suicidal ideation no psychotic symptoms HPI: No new physical complaints. Patient with recurrent severe depression that has responded to ECT  Past Medical History  Diagnosis Date  . Asthma   . Allergic rhinitis, cause unspecified   . Bipolar 1 disorder (HCC)   . Morbid obesity (HCC)   . Thyroid nodule   . Mental disorder   . 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 Pt has never been tested.   . Anxiety   . OSA (obstructive sleep apnea)     uses cpap does not know settings  . Difficult intravenous access     hard stick for ivs and lab per pt  . Nasal bone fracture age 17    left side cannot place tube in nostril per pt  . Iron deficiency anemia, unspecified 02/24/2013  . Pernicious anemia 02/24/2013  . Anxiety   . Complication of anesthesia 2004    bp dropped after c section, took 24hrs to feels leg , numbing medicine does not last long when injected into skin  . Family history of adverse reaction to anesthesia     mom heart stopped times 2 in or  . 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 GERD (gastroesophageal reflux disease)   . Headache   . Fibromyalgia     Past Surgical History  Procedure Laterality Date  . Tubal ligation    . Cesarean section    . Noraplant    . Wisdom tooth extraction    . Esophagogastroduodenoscopy N/A 01/02/2013    Procedure: ESOPHAGOGASTRODUODENOSCOPY (EGD);  Surgeon: Louis Meckel, MD;  Location: [facility];  Service: Endoscopy;  Laterality: N/A;  . Colonoscopy with propofol N/A 01/02/2013    Procedure: COLONOSCOPY WITH PROPOFOL;  Surgeon: Louis Meckel, MD;  Location: [facility];  Service: Endoscopy;  Laterality: N/A;  . Tubal ligation Bilateral     Family History  Problem Relation Age of Onset  . Breast cancer Mother   . Asthma Mother   .  Bipolar disorder Mother   . Parkinson's disease Mother   . Emphysema Paternal Grandmother   . Diabetes Father    Social History:  reports that she quit smoking about 9 years ago. Her smoking use included Cigarettes. She started smoking about 28 years ago. She has a 10 pack-year smoking history. She has never used smokeless tobacco. She reports that she drinks alcohol. She reports that she does not use illicit drugs.  Allergies:  Allergies  Allergen Reactions  . Bee Venom   . Bupropion Hcl Other (See Comments)    swelling in hands  . Capzasin [Capsaicin] Other (See Comments)    Red and burning  . Citalopram Hydrobromide Other (See Comments)    Doesn't remember reaction  . Other Other (See Comments)    Bleach cause vocal cord to close  . Strawberry Extract Other (See Comments)    Caused facial swelling and rash  . Lamictal [Lamotrigine] Rash  . Latex Other (See Comments)    Blisters where the latex touched it     (Not in a hospital admission)  Results for orders placed or performed during the hospital encounter of 08/24/15 (from the past 48 hour(s))  Pregnancy, urine POC     Status: None   Collection Time: 08/24/15 10:34  AM  Result Value Ref Range   Preg Test, Ur NEGATIVE NEGATIVE    Comment:        THE SENSITIVITY OF THIS METHODOLOGY IS >24 mIU/mL    No results found.  Review of Systems  Constitutional: Negative.   HENT: Negative.   Eyes: Negative.   Respiratory: Negative.   Cardiovascular: Negative.   Gastrointestinal: Negative.   Musculoskeletal: Negative.   Skin: Negative.   Neurological: Negative.   Psychiatric/Behavioral: Positive for depression. Negative for suicidal ideas, hallucinations, memory loss and substance abuse. The patient is not nervous/anxious and does not have insomnia.     Blood pressure 102/71, pulse 72, temperature 96.7 F (35.9 C), temperature source Oral, resp. rate 18, height  (1.753 m), weight 126.1 kg (278 lb), last menstrual  period 07/25/2015. Physical Exam  Nursing note and vitals reviewed. Constitutional: She appears well-developed and well-nourished.  HENT:  Head: Normocephalic and atraumatic.  Eyes: Conjunctivae are normal. Pupils are equal, round, and reactive to light.  Neck: Normal range of motion.  Cardiovascular: Normal rate, regular rhythm and normal heart sounds.   Respiratory: Effort normal and breath sounds normal. No respiratory distress.  GI: Soft.  Musculoskeletal: Normal range of motion.  Neurological: She is alert.  Skin: Skin is warm and dry.  Psychiatric: Judgment and thought content normal. Her speech is delayed. She is slowed. Cognition and memory are normal. She exhibits a depressed mood.     Assessment/Plan ECT today and then follow-up in 2 weeks for return treatment.  [name] 08/24/2015, 10:48 AM

## 2015-08-24 NOTE — Procedures (Signed)
ECT SERVICES Physician's Interval Evaluation & Treatment Note  Patient Identification: Stacey [name] MRN:  [id number] Date of Evaluation:  08/24/2015 TX #: [id number]  MADRS:   MMSE:   P.E. Findings:  No new physical complaints or changes to physical exam  Psychiatric Interval Note:  Mood a little bit more down  Subjective:  Patient is a 50 y.o. female seen for evaluation for Electroconvulsive Therapy. Prefers to come back sooner  Treatment Summary:     Right Unilateral              Bilateral   % Energy : 0.3 ms 100%   Impedance: 1130 ohms  Seizure Energy Index: 18,540 V squared  Postictal Suppression Index: 77%  Seizure Concordance Index: 83%  Medications  Pre Shock: Toradol 30 mg, Brevital 120 mg, succinylcholine 150 mg  Post Shock: None  Seizure Duration: 6 seconds by EMG, 21 seconds by my visual reading of the EEG   Comments: Follow-up in 2 weeks because of some decline in mood   Lungs:    Clear to auscultation                Other:   Heart:      Regular rhythm              irregular rhythm      Previous H&P reviewed, patient examined and there are NO CHANGES                   Previous H&P reviewed, patient examined and there are changes noted.   Mordecai Rasmussen, MD 1/25/201710:52 AM

## 2015-08-24 NOTE — Anesthesia Preprocedure Evaluation (Signed)
Anesthesia Evaluation  Patient identified by MRN, date of birth, ID band Patient confused    Reviewed: Allergy & Precautions, NPO status , Patient's Chart, lab work & pertinent test results  History of Anesthesia Complications (+) Family history of anesthesia reaction and history of anesthetic complications  Airway Mallampati: III       Dental no notable dental hx. (+) Teeth Intact   Pulmonary shortness of breath and with exertion, asthma , sleep apnea , former smoker,     + decreased breath sounds      Cardiovascular negative cardio ROS Normal cardiovascular exam     Neuro/Psych  Headaches, PSYCHIATRIC DISORDERS  Neuromuscular disease    GI/Hepatic Neg liver ROS, GERD  Medicated and Controlled,  Endo/Other  diabetes, Type 2, Oral Hypoglycemic Agents  Renal/GU negative Renal ROS     Musculoskeletal  (+) Fibromyalgia -  Abdominal (+) + obese,   Peds negative pediatric ROS (+)  Hematology  (+) anemia ,   Anesthesia Other Findings Past Medical History:   Asthma                                                       Allergic rhinitis, cause unspecified                         Bipolar 1 disorder                                           Morbid obesity                                               Thyroid nodule                                               Mental disorder                                              Beta thalassemia minor                          Pt has a *   Anxiety                                                      OSA (obstructive sleep apnea)                                  Comment:uses cpap does not know settings   Difficult intravenous access  Comment:hard stick for ivs and lab per pt   Nasal bone fracture                             age 50          Comment:left side cannot place tube in nostril per pt   Iron deficiency anemia, unspecified              02/24/2013    Pernicious anemia                               02/24/2013    Anxiety                                                      Complication of anesthesia                      2004           Comment:bp dropped after c section, took 24hrs to feels              leg , numbing medicine does not last long when               injected into skin   Family history of adverse reaction to anesthes*                Comment:mom heart stopped times 2 in or   Diabetes mellitus without complication                         Comment:pre diabetic   GERD (gastroesophageal reflux disease)                       Headache                                                     Fibromyalgia                                                 Reproductive/Obstetrics negative OB ROS                             Anesthesia Physical  Anesthesia Plan  ASA: III  Anesthesia Plan: General   Post-op Pain Management:    Induction: Intravenous  Airway Management Planned: Mask  Additional Equipment:   Intra-op Plan:   Post-operative Plan:   Informed Consent: I have reviewed the patients History and Physical, chart, labs and discussed the procedure including the risks, benefits and alternatives for the proposed anesthesia with the patient or authorized representative who has indicated his/her understanding and acceptance.     Plan Discussed with: CRNA, Anesthesiologist and Surgeon  Anesthesia Plan Comments:         Anesthesia Quick Evaluation  Anesthesia Evaluation  Patient identified by MRN, date of birth, ID band Patient confused    Reviewed: Allergy & Precautions, NPO status , Patient's Chart, lab work & pertinent test results  History of Anesthesia Complications (+) Family history of anesthesia reaction and history of anesthetic complications  Airway Mallampati: III       Dental no notable dental hx. (+) Teeth Intact    Pulmonary shortness of breath and with exertion, asthma , sleep apnea , former smoker,     + decreased breath sounds      Cardiovascular negative cardio ROS Normal cardiovascular exam     Neuro/Psych  Headaches, PSYCHIATRIC DISORDERS Anxiety Depression Bipolar Disorder  Neuromuscular disease    GI/Hepatic Neg liver ROS, GERD  Medicated and Controlled,  Endo/Other  diabetes, Type 2, Oral Hypoglycemic Agents  Renal/GU negative Renal ROS     Musculoskeletal  (+) Fibromyalgia -  Abdominal (+) + obese,   Peds negative pediatric ROS (+)  Hematology  (+) anemia ,   Anesthesia Other Findings Past Medical History:   Asthma                                                       Allergic rhinitis, cause unspecified                         Bipolar 1 disorder                                           Morbid obesity                                               Thyroid nodule                                               Mental disorder                                              Beta thalassemia minor                          Pt has a *   Anxiety                                                      OSA (obstructive sleep apnea)                                  Comment:uses cpap does not know settings   Difficult intravenous access  Comment:hard stick for ivs and lab per pt   Nasal bone fracture                             age 50          Comment:left side cannot place tube in nostril per pt   Iron deficiency anemia, unspecified             02/24/2013    Pernicious anemia                               02/24/2013    Anxiety                                                      Complication of anesthesia                      2004           Comment:bp dropped after c section, took 24hrs to feels              leg , numbing medicine does not last long when               injected into skin   Family history of adverse reaction to anesthes*                 Comment:mom heart stopped times 2 in or   Diabetes mellitus without complication                         Comment:pre diabetic   GERD (gastroesophageal reflux disease)                       Headache                                                     Fibromyalgia                                                 Reproductive/Obstetrics negative OB ROS                             Anesthesia Physical  Anesthesia Plan  ASA: III  Anesthesia Plan: General   Post-op Pain Management:    Induction: Intravenous  Airway Management Planned: Mask  Additional Equipment:   Intra-op Plan:   Post-operative Plan:   Informed Consent: I have reviewed the patients History and Physical, chart, labs and discussed the procedure including the risks, benefits and alternatives for the proposed anesthesia with the patient or authorized representative who has indicated his/her understanding and acceptance.     Plan Discussed with: CRNA, Anesthesiologist and Surgeon  Anesthesia Plan Comments:         Anesthesia Quick Evaluation

## 2015-08-25 NOTE — Anesthesia Postprocedure Evaluation (Signed)
Anesthesia Post Note  Patient: Stacey [name]  Procedure(s) Performed: * No procedures listed *  Patient location during evaluation: PACU Anesthesia Type: General Level of consciousness: awake and alert Pain management: pain level controlled Vital Signs Assessment: post-procedure vital signs reviewed and stable Respiratory status: spontaneous breathing, nonlabored ventilation, respiratory function stable and patient connected to nasal cannula oxygen Cardiovascular status: blood pressure returned to baseline and stable Postop Assessment: no signs of nausea or vomiting Anesthetic complications: no    Last Vitals:  Filed Vitals:   08/24/15 1141 08/24/15 1159  BP: 121/71 119/75  Pulse: 69 75  Temp: 36.7 C   Resp: 10 16    Last Pain:  Filed Vitals:   08/24/15 1202  PainSc: 1                  Lenard Simmer

## 2015-09-21 ENCOUNTER — Encounter: Payer: Self-pay | Admitting: Anesthesiology

## 2015-09-21 ENCOUNTER — Encounter
Admission: RE | Admit: 2015-09-21 | Discharge: 2015-09-21 | Disposition: A | Discharge status: Home / Self Care | Payer: Medicaid Other | Source: Ambulatory Visit | Attending: Psychiatry | Admitting: Psychiatry

## 2015-09-21 DIAGNOSIS — F332 Major depressive disorder, recurrent severe without psychotic features: Secondary | ICD-10-CM

## 2015-09-21 DIAGNOSIS — J45909 Unspecified asthma, uncomplicated: Secondary | ICD-10-CM | POA: Diagnosis not present

## 2015-09-21 DIAGNOSIS — Z91018 Allergy to other foods: Secondary | ICD-10-CM | POA: Insufficient documentation

## 2015-09-21 DIAGNOSIS — D510 Vitamin B12 deficiency anemia due to intrinsic factor deficiency: Secondary | ICD-10-CM | POA: Diagnosis not present

## 2015-09-21 DIAGNOSIS — E119 Type 2 diabetes mellitus without complications: Secondary | ICD-10-CM | POA: Insufficient documentation

## 2015-09-21 DIAGNOSIS — Z91030 Bee allergy status: Secondary | ICD-10-CM | POA: Diagnosis not present

## 2015-09-21 DIAGNOSIS — Z9889 Other specified postprocedural states: Secondary | ICD-10-CM | POA: Diagnosis not present

## 2015-09-21 DIAGNOSIS — K219 Gastro-esophageal reflux disease without esophagitis: Secondary | ICD-10-CM | POA: Diagnosis not present

## 2015-09-21 DIAGNOSIS — Z888 Allergy status to other drugs, medicaments and biological substances status: Secondary | ICD-10-CM | POA: Insufficient documentation

## 2015-09-21 DIAGNOSIS — F319 Bipolar disorder, unspecified: Secondary | ICD-10-CM | POA: Insufficient documentation

## 2015-09-21 DIAGNOSIS — Z91040 Latex allergy status: Secondary | ICD-10-CM | POA: Diagnosis not present

## 2015-09-21 DIAGNOSIS — G4733 Obstructive sleep apnea (adult) (pediatric): Secondary | ICD-10-CM | POA: Diagnosis not present

## 2015-09-21 DIAGNOSIS — M797 Fibromyalgia: Secondary | ICD-10-CM | POA: Insufficient documentation

## 2015-09-21 LAB — GLUCOSE, CAPILLARY: GLUCOSE-CAPILLARY: 77 mg/dL (ref 65–99)

## 2015-09-21 MED ORDER — KETOROLAC TROMETHAMINE 30 MG/ML IJ SOLN
30.0000 mg | Freq: Once | INTRAMUSCULAR | Status: AC
  Administered 2015-09-21: 30 mg via INTRAVENOUS

## 2015-09-21 MED ORDER — SODIUM CHLORIDE 0.9 % IV SOLN
250.0000 mL | Freq: Once | INTRAVENOUS | Status: AC
  Administered 2015-09-21: 500 mL via INTRAVENOUS

## 2015-09-21 MED ORDER — SUCCINYLCHOLINE 20MG/ML (10ML) SYRINGE FOR MEDFUSION PUMP - OPTIME
INTRAMUSCULAR | Status: DC | PRN
  Administered 2015-09-21: 150 mg via INTRAVENOUS

## 2015-09-21 MED ORDER — SODIUM CHLORIDE 0.9 % IV SOLN
INTRAVENOUS | Status: DC | PRN
  Administered 2015-09-21: 10:00:00 via INTRAVENOUS

## 2015-09-21 MED ORDER — METHOHEXITAL SODIUM 100 MG/10ML IV SOSY
PREFILLED_SYRINGE | INTRAVENOUS | Status: DC | PRN
  Administered 2015-09-21: 140 mg via INTRAVENOUS

## 2015-09-21 NOTE — Transfer of Care (Signed)
Immediate Anesthesia Transfer of Care Note  Patient: Stacey [name]  Procedure(s) Performed: ECT  Patient Location: PACU  Anesthesia Type:General  Level of Consciousness: sedated  Airway & Oxygen Therapy: Patient Spontanous Breathing and Patient connected to face mask oxygen  Post-op Assessment: Report given to RN  Post vital signs: Reviewed and stable  Last Vitals:  Filed Vitals:   09/21/15 0850 09/21/15 1047  BP: 132/74 143/68  Pulse: 87 67  Temp: 35.9 C 98.36F  Resp:  17    Complications: No apparent anesthesia complications

## 2015-09-21 NOTE — Anesthesia Postprocedure Evaluation (Signed)
Anesthesia Post Note  Patient: Stacey [name]  Procedure(s) Performed: * No procedures listed *  Patient location during evaluation: PACU Anesthesia Type: General Level of consciousness: awake and alert Pain management: pain level controlled Vital Signs Assessment: post-procedure vital signs reviewed and stable Respiratory status: spontaneous breathing and respiratory function stable Cardiovascular status: stable Anesthetic complications: no    Last Vitals:  Filed Vitals:   09/21/15 1055 09/21/15 1059  BP:  128/77  Pulse: 81 88  Temp:    Resp: 17 19    Last Pain:  Filed Vitals:   09/21/15 1101  PainSc: 2                  KEPHART,WILLIAM K

## 2015-09-21 NOTE — Anesthesia Procedure Notes (Signed)
Date/Time: 09/21/2015 10:36 AM Performed by: Lily Kocher Pre-anesthesia Checklist: Patient identified, Emergency Drugs available, Suction available and Patient being monitored Patient Re-evaluated:Patient Re-evaluated prior to inductionOxygen Delivery Method: Circle system utilized Preoxygenation: Pre-oxygenation with 100% oxygen Intubation Type: IV induction Ventilation: Mask ventilation without difficulty and Mask ventilation throughout procedure Airway Equipment and Method: Bite block Placement Confirmation: positive ETCO2 Dental Injury: Teeth and Oropharynx as per pre-operative assessment

## 2015-09-21 NOTE — Anesthesia Preprocedure Evaluation (Signed)
Anesthesia Evaluation  Patient identified by MRN, date of birth, ID band Patient confused    Reviewed: Allergy & Precautions, NPO status , Patient's Chart, lab work & pertinent test results  History of Anesthesia Complications (+) Family history of anesthesia reaction and history of anesthetic complications  Airway Mallampati: III       Dental no notable dental hx. (+) Teeth Intact   Pulmonary shortness of breath and with exertion, asthma , sleep apnea , former smoker,     + decreased breath sounds      Cardiovascular negative cardio ROS Normal cardiovascular exam     Neuro/Psych  Headaches, PSYCHIATRIC DISORDERS Anxiety Depression Bipolar Disorder  Neuromuscular disease    GI/Hepatic Neg liver ROS, GERD  Medicated and Controlled,  Endo/Other  diabetes, Type 2, Oral Hypoglycemic Agents  Renal/GU negative Renal ROS     Musculoskeletal  (+) Fibromyalgia -  Abdominal (+) + obese,   Peds negative pediatric ROS (+)  Hematology  (+) anemia ,   Anesthesia Other Findings Past Medical History:   Asthma                                                       Allergic rhinitis, cause unspecified                         Bipolar 1 disorder                                           Morbid obesity                                               Thyroid nodule                                               Mental disorder                                              Beta thalassemia minor                          Pt has a *   Anxiety                                                      OSA (obstructive sleep apnea)                                  Comment:uses cpap does not know settings   Difficult intravenous access                                     Comment:hard stick for ivs and lab per pt   Nasal bone fracture                             age 8          Comment:left side cannot place tube in nostril per pt   Iron deficiency  anemia, unspecified             02/24/2013    Pernicious anemia                               02/24/2013    Anxiety                                                      Complication of anesthesia                      2004           Comment:bp dropped after c section, took 24hrs to feels              leg , numbing medicine does not last long when               injected into skin   Family history of adverse reaction to anesthes*                Comment:mom heart stopped times 2 in or   Diabetes mellitus without complication                         Comment:pre diabetic   GERD (gastroesophageal reflux disease)                       Headache                                                     Fibromyalgia                                                 Reproductive/Obstetrics negative OB ROS                             Anesthesia Physical  Anesthesia Plan  ASA: III  Anesthesia Plan: General   Post-op Pain Management:    Induction: Intravenous  Airway Management Planned: Mask  Additional Equipment:   Intra-op Plan:   Post-operative Plan:   Informed Consent: I have reviewed the patients History and Physical, chart, labs and discussed the procedure including the risks, benefits and alternatives for the proposed anesthesia with the patient or authorized representative who has indicated his/her understanding and acceptance.     Plan Discussed with: CRNA, Anesthesiologist and Surgeon  Anesthesia Plan Comments:         Anesthesia Quick Evaluation                                    Anesthesia Evaluation  Patient identified by MRN, date of birth, ID band Patient confused    Reviewed: Allergy & Precautions, NPO status , Patient's Chart, lab work & pertinent test results  History of Anesthesia Complications (+) Family history of anesthesia reaction and history of anesthetic complications  Airway Mallampati: III       Dental no notable dental  hx. (+) Teeth Intact   Pulmonary shortness of breath and with exertion, asthma , sleep apnea , former smoker,     + decreased breath sounds      Cardiovascular negative cardio ROS Normal cardiovascular exam     Neuro/Psych  Headaches, PSYCHIATRIC DISORDERS Anxiety Depression Bipolar Disorder  Neuromuscular disease    GI/Hepatic Neg liver ROS, GERD  Medicated and Controlled,  Endo/Other  diabetes, Type 2, Oral Hypoglycemic Agents  Renal/GU negative Renal ROS     Musculoskeletal  (+) Fibromyalgia -  Abdominal (+) + obese,   Peds negative pediatric ROS (+)  Hematology  (+) anemia ,   Anesthesia Other Findings Past Medical History:   Asthma                                                       Allergic rhinitis, cause unspecified                         Bipolar 1 disorder                                           Morbid obesity                                               Thyroid nodule                                               Mental disorder                                              Beta thalassemia minor                          Pt has a *   Anxiety                                                      OSA (obstructive sleep apnea)                                  Comment:uses cpap does not know settings   Difficult intravenous access                                     Comment:hard stick for ivs and lab per pt   Nasal bone fracture                             age 8          Comment:left side cannot place tube in nostril per pt   Iron deficiency anemia, unspecified             02/24/2013    Pernicious anemia                               02/24/2013    Anxiety                                                      Complication of anesthesia                      2004           Comment:bp dropped after c section, took 24hrs to feels              leg , numbing medicine does not last long when               injected into skin   Family history of adverse  reaction to anesthes*                Comment:mom heart stopped times 2 in or   Diabetes mellitus without complication                         Comment:pre diabetic   GERD (gastroesophageal reflux disease)                       Headache                                                     Fibromyalgia                                                 Reproductive/Obstetrics negative OB ROS                             Anesthesia Physical  Anesthesia Plan  ASA: III  Anesthesia Plan: General   Post-op Pain Management:    Induction: Intravenous  Airway Management Planned: Mask  Additional Equipment:   Intra-op Plan:   Post-operative Plan:   Informed Consent: I have reviewed the patients History and Physical, chart, labs and discussed the procedure including the risks, benefits and alternatives for the proposed anesthesia with the patient or authorized representative who has indicated his/her understanding and acceptance.     Plan Discussed with: CRNA, Anesthesiologist and Surgeon  Anesthesia Plan Comments:         Anesthesia Quick Evaluation   

## 2015-09-21 NOTE — Procedures (Signed)
ECT SERVICES Physician's Interval Evaluation & Treatment Note  Patient Identification: Stacey [name] MRN:  [id number] Date of Evaluation:  09/21/2015 TX #: 15  MADRS:   MMSE:   P.E. Findings:  No change to physical exam  Psychiatric Interval Note:  Mood stable no return of major depression  Subjective:  Patient is a 50 y.o. female seen for evaluation for Electroconvulsive Therapy. No specific complaint  Treatment Summary:     Right Unilateral              Bilateral   % Energy : 0.3 ms 100%   Impedance: 1350 ohms  Seizure Energy Index: 13,707 V squared  Postictal Suppression Index: 46%  Seizure Concordance Index: 82%  Medications  Pre Shock: Toradol 30 mg, Brevital 140 mg, succinylcholine 150 mg  Post Shock: None  Seizure Duration: 18 seconds by EMG 25 seconds by EEG   Comments: Follow-up 4 weeks on March 22   Lungs:    Clear to auscultation                Other:   Heart:      Regular rhythm              irregular rhythm      Previous H&P reviewed, patient examined and there are NO CHANGES                   Previous H&P reviewed, patient examined and there are changes noted.   Mordecai Rasmussen, MD 2/22/201710:29 AM

## 2015-09-21 NOTE — H&P (Signed)
Stacey [name] is an 50 y.o. female.   Chief Complaint: No specific new complaint. Patient with recurrent depression receiving maintenance ECT tolerating treatment well. HPI: Maintenance ECT currently every 3 weeks will be stretching it out.  Past Medical History  Diagnosis Date  . Asthma   . Allergic rhinitis, cause unspecified   . Bipolar 1 disorder (HCC)   . Morbid obesity (HCC)   . Thyroid nodule   . Mental disorder   . 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 Pt has never been tested.   . Anxiety   . OSA (obstructive sleep apnea)     uses cpap does not know settings  . Difficult intravenous access     hard stick for ivs and lab per pt  . Nasal bone fracture age 43    left side cannot place tube in nostril per pt  . Iron deficiency anemia, unspecified 02/24/2013  . Pernicious anemia 02/24/2013  . Anxiety   . Complication of anesthesia 2004    bp dropped after c section, took 24hrs to feels leg , numbing medicine does not last long when injected into skin  . Family history of adverse reaction to anesthesia     mom heart stopped times 2 in or  . 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 GERD (gastroesophageal reflux disease)   . Headache   . Fibromyalgia     Past Surgical History  Procedure Laterality Date  . Tubal ligation    . Cesarean section    . Noraplant    . Wisdom tooth extraction    . Esophagogastroduodenoscopy N/A 01/02/2013    Procedure: ESOPHAGOGASTRODUODENOSCOPY (EGD);  Surgeon: Louis Meckel, MD;  Location: [facility];  Service: Endoscopy;  Laterality: N/A;  . Colonoscopy with propofol N/A 01/02/2013    Procedure: COLONOSCOPY WITH PROPOFOL;  Surgeon: Louis Meckel, MD;  Location: [facility];  Service: Endoscopy;  Laterality: N/A;  . Tubal ligation Bilateral     Family History  Problem Relation Age of Onset  . Breast cancer Mother   . Asthma Mother   . Bipolar disorder Mother   . Parkinson's disease Mother   .  Emphysema Paternal Grandmother   . Diabetes Father    Social History:  reports that she quit smoking about 9 years ago. Her smoking use included Cigarettes. She started smoking about 28 years ago. She has a 10 pack-year smoking history. She has never used smokeless tobacco. She reports that she drinks alcohol. She reports that she does not use illicit drugs.  Allergies:  Allergies  Allergen Reactions  . Bee Venom   . Bupropion Hcl Other (See Comments)    swelling in hands  . Capzasin [Capsaicin] Other (See Comments)    Red and burning  . Citalopram Hydrobromide Other (See Comments)    Doesn't remember reaction  . Other Other (See Comments)    Bleach cause vocal cord to close  . Strawberry Extract Other (See Comments)    Caused facial swelling and rash  . Lamictal [Lamotrigine] Rash  . Latex Other (See Comments)    Blisters where the latex touched it     (Not in a hospital admission)  No results found for this or any previous visit (from the past 48 hour(s)). No results found.  Review of Systems  Constitutional: Negative.   HENT: Negative.   Eyes: Negative.   Respiratory: Negative.   Cardiovascular: Negative.   Gastrointestinal: Negative.   Musculoskeletal:  Negative.   Skin: Negative.   Neurological: Negative.   Psychiatric/Behavioral: Negative for depression, suicidal ideas, hallucinations, memory loss and substance abuse. The patient is not nervous/anxious and does not have insomnia.     Blood pressure 132/74, pulse 87, temperature 96.7 F (35.9 C), height 5' 8.5" (1.74 m), weight 118.842 kg (262 lb), last menstrual period 09/21/2015, SpO2 98 %. Physical Exam  Nursing note and vitals reviewed. Constitutional: She appears well-developed and well-nourished.  HENT:  Head: Normocephalic and atraumatic.  Eyes: Conjunctivae are normal. Pupils are equal, round, and reactive to light.  Neck: Normal range of motion.  Cardiovascular: Normal rate, regular rhythm and normal  heart sounds.   Respiratory: Effort normal and breath sounds normal. No respiratory distress.  GI: Soft.  Musculoskeletal: Normal range of motion.  Neurological: She is alert.  Skin: Skin is warm and dry.  Psychiatric: She has a normal mood and affect. Her behavior is normal. Judgment and thought content normal.     Assessment/Plan Follow-up in 4 weeks supportive counseling and review plan with patient no change to medicine  Mordecai Rasmussen, MD 09/21/2015, 10:28 AM

## 2015-10-19 ENCOUNTER — Encounter: Payer: Self-pay | Admitting: Anesthesiology

## 2015-10-19 ENCOUNTER — Encounter
Admission: RE | Admit: 2015-10-19 | Discharge: 2015-10-19 | Disposition: A | Discharge status: Home / Self Care | Payer: Medicaid Other | Source: Ambulatory Visit | Attending: Psychiatry | Admitting: Psychiatry

## 2015-10-19 DIAGNOSIS — G4733 Obstructive sleep apnea (adult) (pediatric): Secondary | ICD-10-CM | POA: Diagnosis not present

## 2015-10-19 DIAGNOSIS — M797 Fibromyalgia: Secondary | ICD-10-CM | POA: Diagnosis not present

## 2015-10-19 DIAGNOSIS — K219 Gastro-esophageal reflux disease without esophagitis: Secondary | ICD-10-CM | POA: Insufficient documentation

## 2015-10-19 DIAGNOSIS — F319 Bipolar disorder, unspecified: Secondary | ICD-10-CM | POA: Insufficient documentation

## 2015-10-19 DIAGNOSIS — R7303 Prediabetes: Secondary | ICD-10-CM | POA: Diagnosis not present

## 2015-10-19 DIAGNOSIS — J45909 Unspecified asthma, uncomplicated: Secondary | ICD-10-CM | POA: Diagnosis not present

## 2015-10-19 DIAGNOSIS — F419 Anxiety disorder, unspecified: Secondary | ICD-10-CM | POA: Diagnosis not present

## 2015-10-19 DIAGNOSIS — F332 Major depressive disorder, recurrent severe without psychotic features: Secondary | ICD-10-CM | POA: Diagnosis not present

## 2015-10-19 LAB — POCT PREGNANCY, URINE: PREG TEST UR: NEGATIVE

## 2015-10-19 MED ORDER — KETOROLAC TROMETHAMINE 30 MG/ML IJ SOLN
30.0000 mg | Freq: Once | INTRAMUSCULAR | Status: AC
Start: 2015-10-19 — End: 2015-10-19
  Administered 2015-10-19: 30 mg via INTRAVENOUS

## 2015-10-19 MED ORDER — SUCCINYLCHOLINE CHLORIDE 20 MG/ML IJ SOLN
INTRAMUSCULAR | Status: DC | PRN
  Administered 2015-10-19: 150 mg via INTRAVENOUS

## 2015-10-19 MED ORDER — SODIUM CHLORIDE 0.9 % IV SOLN
250.0000 mL | Freq: Once | INTRAVENOUS | Status: AC
  Administered 2015-10-19: 10:00:00 via INTRAVENOUS

## 2015-10-19 MED ORDER — SODIUM CHLORIDE 0.9 % IV SOLN
INTRAVENOUS | Status: DC | PRN
  Administered 2015-10-19: 10:00:00 via INTRAVENOUS

## 2015-10-19 MED ORDER — METHOHEXITAL SODIUM 100 MG/10ML IV SOSY
PREFILLED_SYRINGE | INTRAVENOUS | Status: DC | PRN
  Administered 2015-10-19: 140 mg via INTRAVENOUS

## 2015-10-19 NOTE — Anesthesia Postprocedure Evaluation (Signed)
Anesthesia Post Note  Patient: Stacey [name]  Procedure(s) Performed: * No procedures listed *  Patient location during evaluation: PACU Anesthesia Type: General Level of consciousness: awake and alert Pain management: pain level controlled Vital Signs Assessment: post-procedure vital signs reviewed and stable Respiratory status: spontaneous breathing and respiratory function stable Cardiovascular status: stable Anesthetic complications: no    Last Vitals:  Filed Vitals:   10/19/15 1124 10/19/15 1134  BP: 114/70 114/81  Pulse: 70 70  Temp:    Resp: 10 12    Last Pain:  Filed Vitals:   10/19/15 1141  PainSc: 0-No pain                 Graclyn Lawther K

## 2015-10-19 NOTE — Procedures (Signed)
ECT SERVICES Physician's Interval Evaluation & Treatment Note  Patient Identification: Suzan SlickLorianne F Nesmith MRN:  [id number] Date of Evaluation:  10/19/2015 TX #: 16  MADRS:  29  MMSE: 30  P.E. Findings:  Physically no change. Vital signs stable. No physical complaints no change to medicine.  Psychiatric Interval Note:  Patient reports that she is feeling good and does not have a sense of being depressed although her score is still somewhat elevated. No evidence of suicidality or thought disorder.  Subjective:  Patient is a 50 y.o. female seen for evaluation for Electroconvulsive Therapy. Patient feels improved and stable.  Treatment Summary:   [x]   Right Unilateral             []  Bilateral   % Energy : 0.3 ms 100%   Impedance: 730 ohms  Seizure Energy Index: 7293 V squared  Postictal Suppression Index: Less than 10%  Seizure Concordance Index: 73%  Medications  Pre Shock: Toradol 30 mg, Brevital 140 mg, succinylcholine 150 mg  Post Shock:    Seizure Duration: 6 seconds by EMG, 24 seconds by EEG   Comments: Follow-up April 19 4 weeks   Lungs:  [x]   Clear to auscultation               []  Other:   Heart:    [x]   Regular rhythm             []  irregular rhythm    [x]   Previous H&P reviewed, patient examined and there are NO CHANGES                 []   Previous H&P reviewed, patient examined and there are changes noted.   Mordecai RasmussenJohn Nicloe Frontera, MD 3/22/201710:40 AM

## 2015-10-19 NOTE — Transfer of Care (Signed)
Immediate Anesthesia Transfer of Care Note  Patient: Stacey [name]  Procedure(s) Performed: * No procedures listed *  Patient Location: PACU  Anesthesia Type:General  Level of Consciousness: sedated and responds to stimulation  Airway & Oxygen Therapy: Patient Spontanous Breathing and Patient connected to face mask oxygen  Post-op Assessment: Report given to RN and Post -op Vital signs reviewed and stable  Post vital signs: Reviewed and stable  Last Vitals:  Filed Vitals:   10/19/15 0830  BP: 111/74  Pulse: 75  Temp: 36.2 C  Resp: 16    Complications: No apparent anesthesia complications

## 2015-10-19 NOTE — Discharge Instructions (Addendum)
1)  The drugs that you have been given will stay in your system until tomorrow so for the       next 24 hours you should not:  A. Drive an automobile  B. Make any legal decisions  C. Drink any alcoholic beverages  2)  You may resume your regular meals upon return home.  3)  A responsible adult must take you home.  Someone should stay with you for a few          hours, then be available by phone for the remainder of the treatment day.  4)  You May experience any of the following symptoms:  Headache, Nausea and a dry mouth (due to the medications you were given),  temporary memory loss and some confusion, or sore muscles (a warm bath  should help this).  If you you experience any of these symptoms let us know on                your return visit.  5)  Report any of the following: any acute discomfort, severe headache, or temperature        greater than 100.5 F.   Also report any unusual redness, swelling, drainage, or pain         at your IV site.    You may report Symptoms to:  ECT [facility]- [place] [facility]          Phone: [phone], ECT Department           or Dr. Shary Keylapac's office [phone]  6)  Your next ECT Treatment is Wednesday.  November 16, 2015.  We will call 2 days prior to your scheduled appointment for arrival times.  7)  Nothing to eat or drink after midnight the night before your procedure.  8)  Take    With a sip of water the morning of your procedure.  9)  Other Instructions: Call [phone] to cancel the morning of your procedure due         to illness or emergency.  10) We will call within 72 hours to assess how you are feeling.

## 2015-10-19 NOTE — H&P (Signed)
Stacey [name] is an 50 y.o. female.   Chief Complaint: Patient has no new complaint. Mood is good. Denies any suicidal thought. Not feeling depressed. No new physical complaints. HPI: Patient with a history of recurrent depression that has responded well to ECT addition and is doing well in maintenance phase.  Past Medical History  Diagnosis Date  . Asthma   . Allergic rhinitis, cause unspecified   . Bipolar 1 disorder (HCC)   . Morbid obesity (HCC)   . Thyroid nodule   . Mental disorder   . 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 Pt has never been tested.   . Anxiety   . OSA (obstructive sleep apnea)     uses cpap does not know settings  . Difficult intravenous access     hard stick for ivs and lab per pt  . Nasal bone fracture age 89    left side cannot place tube in nostril per pt  . Iron deficiency anemia, unspecified 02/24/2013  . Pernicious anemia 02/24/2013  . Anxiety   . Complication of anesthesia 2004    bp dropped after c section, took 24hrs to feels leg , numbing medicine does not last long when injected into skin  . Family history of adverse reaction to anesthesia     mom heart stopped times 2 in or  . 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 GERD (gastroesophageal reflux disease)   . Headache   . Fibromyalgia     Past Surgical History  Procedure Laterality Date  . Tubal ligation    . Cesarean section    . Noraplant    . Wisdom tooth extraction    . Esophagogastroduodenoscopy N/A 01/02/2013    Procedure: ESOPHAGOGASTRODUODENOSCOPY (EGD);  Surgeon: Louis Meckel, MD;  Location: [facility];  Service: Endoscopy;  Laterality: N/A;  . Colonoscopy with propofol N/A 01/02/2013    Procedure: COLONOSCOPY WITH PROPOFOL;  Surgeon: Louis Meckel, MD;  Location: [facility];  Service: Endoscopy;  Laterality: N/A;  . Tubal ligation Bilateral     Family History  Problem Relation Age of Onset  . Breast cancer Mother   . Asthma Mother    . Bipolar disorder Mother   . Parkinson's disease Mother   . Emphysema Paternal Grandmother   . Diabetes Father    Social History:  reports that she quit smoking about 9 years ago. Her smoking use included Cigarettes. She started smoking about 28 years ago. She has a 10 pack-year smoking history. She has never used smokeless tobacco. She reports that she drinks alcohol. She reports that she does not use illicit drugs.  Allergies:  Allergies  Allergen Reactions  . Bee Venom   . Bupropion Hcl Other (See Comments)    swelling in hands  . Capzasin [Capsaicin] Other (See Comments)    Red and burning  . Citalopram Hydrobromide Other (See Comments)    Doesn't remember reaction  . Other Other (See Comments)    Bleach cause vocal cord to close  . Strawberry Extract Other (See Comments)    Caused facial swelling and rash  . Lamictal [Lamotrigine] Rash  . Latex Other (See Comments)    Blisters where the latex touched it     (Not in a hospital admission)  Results for orders placed or performed during the hospital encounter of 10/19/15 (from the past 48 hour(s))  Pregnancy, urine POC     Status: None   Collection Time: 10/19/15  8:29 AM  Result Value Ref Range   Preg Test, Ur NEGATIVE NEGATIVE    Comment:        THE SENSITIVITY OF THIS METHODOLOGY IS >24 mIU/mL    No results found.  Review of Systems  Constitutional: Negative.   HENT: Negative.   Eyes: Negative.   Respiratory: Negative.   Cardiovascular: Negative.   Gastrointestinal: Negative.   Musculoskeletal: Negative.   Skin: Negative.   Neurological: Negative.   Psychiatric/Behavioral: Negative for depression, suicidal ideas, hallucinations, memory loss and substance abuse. The patient is not nervous/anxious and does not have insomnia.     Blood pressure 111/74, pulse 75, temperature 97.2 F (36.2 C), temperature source Oral, resp. rate 16, height 5\' 9"  (1.753 m), weight 118.842 kg (262 lb), last menstrual period  09/21/2015, SpO2 97 %. Physical Exam  Nursing note and vitals reviewed. Constitutional: She appears well-developed and well-nourished.  HENT:  Head: Normocephalic and atraumatic.  Eyes: Conjunctivae are normal. Pupils are equal, round, and reactive to light.  Neck: Normal range of motion.  Cardiovascular: Normal rate, regular rhythm and normal heart sounds.   Respiratory: Effort normal and breath sounds normal. No respiratory distress.  GI: Soft.  Musculoskeletal: Normal range of motion.  Neurological: She is alert.  Skin: Skin is warm and dry.  Psychiatric: She has a normal mood and affect. Her behavior is normal. Judgment and thought content normal.     Assessment/Plan Patient is planning to move to [place] in May. We discussed what we should do before then given that she is doing so well and we will plan one further treatment on April 19.  Mordecai RasmussenJohn Kiasha Bellin, MD 10/19/2015, 10:38 AM

## 2015-10-19 NOTE — Anesthesia Preprocedure Evaluation (Signed)
Anesthesia Evaluation  Patient identified by MRN, date of birth, ID band Patient confused    Reviewed: Allergy & Precautions, NPO status , Patient's Chart, lab work & pertinent test results  History of Anesthesia Complications (+) Family history of anesthesia reaction and history of anesthetic complications  Airway Mallampati: III       Dental no notable dental hx. (+) Teeth Intact   Pulmonary shortness of breath and with exertion, asthma , sleep apnea , former smoker,     + decreased breath sounds      Cardiovascular negative cardio ROS Normal cardiovascular exam     Neuro/Psych  Headaches, PSYCHIATRIC DISORDERS Anxiety Depression Bipolar Disorder  Neuromuscular disease    GI/Hepatic Neg liver ROS, GERD  Medicated and Controlled,  Endo/Other  diabetes, Type 2, Oral Hypoglycemic Agents  Renal/GU negative Renal ROS     Musculoskeletal  (+) Fibromyalgia -  Abdominal (+) + obese,   Peds negative pediatric ROS (+)  Hematology  (+) anemia ,   Anesthesia Other Findings Past Medical History:   Asthma                                                       Allergic rhinitis, cause unspecified                         Bipolar 1 disorder                                           Morbid obesity                                               Thyroid nodule                                               Mental disorder                                              Beta thalassemia minor                          Pt has a *   Anxiety                                                      OSA (obstructive sleep apnea)                                  Comment:uses cpap does not know settings   Difficult intravenous access  Comment:hard stick for ivs and lab per pt   Nasal bone fracture                             age 50          Comment:left side cannot place tube in nostril per pt   Iron deficiency  anemia, unspecified             02/24/2013    Pernicious anemia                               02/24/2013    Anxiety                                                      Complication of anesthesia                      2004           Comment:bp dropped after c section, took 24hrs to feels              leg , numbing medicine does not last long when               injected into skin   Family history of adverse reaction to anesthes*                Comment:mom heart stopped times 2 in or   Diabetes mellitus without complication                         Comment:pre diabetic   GERD (gastroesophageal reflux disease)                       Headache                                                     Fibromyalgia                                                 Reproductive/Obstetrics negative OB ROS                             Anesthesia Physical  Anesthesia Plan  ASA: III  Anesthesia Plan: General   Post-op Pain Management:    Induction: Intravenous  Airway Management Planned: Mask  Additional Equipment:   Intra-op Plan:   Post-operative Plan:   Informed Consent: I have reviewed the patients History and Physical, chart, labs and discussed the procedure including the risks, benefits and alternatives for the proposed anesthesia with the patient or authorized representative who has indicated his/her understanding and acceptance.     Plan Discussed with: CRNA, Anesthesiologist and Surgeon  Anesthesia Plan Comments:         Anesthesia Quick Evaluation  Anesthesia Evaluation  Patient identified by MRN, date of birth, ID band Patient confused    Reviewed: Allergy & Precautions, NPO status , Patient's Chart, lab work & pertinent test results  History of Anesthesia Complications (+) Family history of anesthesia reaction and history of anesthetic complications  Airway Mallampati: III       Dental no notable dental  hx. (+) Teeth Intact   Pulmonary shortness of breath and with exertion, asthma , sleep apnea , former smoker,     + decreased breath sounds      Cardiovascular negative cardio ROS Normal cardiovascular exam     Neuro/Psych  Headaches, PSYCHIATRIC DISORDERS Anxiety Depression Bipolar Disorder  Neuromuscular disease    GI/Hepatic Neg liver ROS, GERD  Medicated and Controlled,  Endo/Other  diabetes, Type 2, Oral Hypoglycemic Agents  Renal/GU negative Renal ROS     Musculoskeletal  (+) Fibromyalgia -  Abdominal (+) + obese,   Peds negative pediatric ROS (+)  Hematology  (+) anemia ,   Anesthesia Other Findings Past Medical History:   Asthma                                                       Allergic rhinitis, cause unspecified                         Bipolar 1 disorder                                           Morbid obesity                                               Thyroid nodule                                               Mental disorder                                              Beta thalassemia minor                          Pt has a *   Anxiety                                                      OSA (obstructive sleep apnea)                                  Comment:uses cpap does not know settings   Difficult intravenous access  Comment:hard stick for ivs and lab per pt   Nasal bone fracture                             age 9          Comment:left side cannot place tube in nostril per pt   Iron deficiency anemia, unspecified             02/24/2013    Pernicious anemia                               02/24/2013    Anxiety                                                      Complication of anesthesia                      2004           Comment:bp dropped after c section, took 24hrs to feels              leg , numbing medicine does not last long when               injected into skin   Family history of adverse  reaction to anesthes*                Comment:mom heart stopped times 2 in or   Diabetes mellitus without complication                         Comment:pre diabetic   GERD (gastroesophageal reflux disease)                       Headache                                                     Fibromyalgia                                                 Reproductive/Obstetrics negative OB ROS                             Anesthesia Physical  Anesthesia Plan  ASA: III  Anesthesia Plan: General   Post-op Pain Management:    Induction: Intravenous  Airway Management Planned: Mask  Additional Equipment:   Intra-op Plan:   Post-operative Plan:   Informed Consent: I have reviewed the patients History and Physical, chart, labs and discussed the procedure including the risks, benefits and alternatives for the proposed anesthesia with the patient or authorized representative who has indicated his/her understanding and acceptance.     Plan Discussed with: CRNA, Anesthesiologist and Surgeon  Anesthesia Plan Comments:         Anesthesia Quick Evaluation

## 2015-11-30 ENCOUNTER — Telehealth: Payer: Self-pay | Admitting: *Deleted

## 2020-11-30 ENCOUNTER — Emergency Department (HOSPITAL_COMMUNITY)
Admission: EM | Admit: 2020-11-30 | Discharge: 2020-11-30 | Disposition: A | Discharge status: Home / Self Care | Payer: Medicaid Other | Attending: Emergency Medicine | Admitting: Emergency Medicine

## 2020-11-30 ENCOUNTER — Encounter (HOSPITAL_COMMUNITY): Payer: Self-pay

## 2020-11-30 ENCOUNTER — Other Ambulatory Visit: Payer: Self-pay

## 2020-11-30 DIAGNOSIS — E119 Type 2 diabetes mellitus without complications: Secondary | ICD-10-CM | POA: Diagnosis not present

## 2020-11-30 DIAGNOSIS — Z91040 Latex allergy status: Secondary | ICD-10-CM | POA: Insufficient documentation

## 2020-11-30 DIAGNOSIS — R3915 Urgency of urination: Secondary | ICD-10-CM | POA: Diagnosis not present

## 2020-11-30 DIAGNOSIS — J45909 Unspecified asthma, uncomplicated: Secondary | ICD-10-CM | POA: Diagnosis not present

## 2020-11-30 DIAGNOSIS — R300 Dysuria: Secondary | ICD-10-CM

## 2020-11-30 DIAGNOSIS — Z87891 Personal history of nicotine dependence: Secondary | ICD-10-CM | POA: Insufficient documentation

## 2020-11-30 DIAGNOSIS — Z79899 Other long term (current) drug therapy: Secondary | ICD-10-CM | POA: Diagnosis not present

## 2020-11-30 LAB — URINALYSIS, ROUTINE W REFLEX MICROSCOPIC
Bacteria, UA: NONE SEEN
Bilirubin Urine: NEGATIVE
Glucose, UA: NEGATIVE mg/dL
Ketones, ur: NEGATIVE mg/dL
Leukocytes,Ua: NEGATIVE
Nitrite: NEGATIVE
Protein, ur: NEGATIVE mg/dL
Specific Gravity, Urine: 1.002 — ABNORMAL LOW (ref 1.005–1.030)
pH: 7 (ref 5.0–8.0)

## 2020-11-30 MED ORDER — PHENAZOPYRIDINE HCL 200 MG PO TABS: 200.0000 mg | ORAL_TABLET | Freq: Three times a day (TID) | ORAL | 0 refills | Status: DC | PRN

## 2020-11-30 NOTE — ED Notes (Signed)
ED Provider at bedside. 

## 2020-11-30 NOTE — Discharge Instructions (Addendum)
You were seen in the emergency department today due to pain with urination.  Your urine did have a small amount of blood in it, however no obvious infection.  We have sent a culture, we will call you if this is abnormal.  Worsening with Pyridium to take 1 tablet every 8 hours as needed for pain.   We have prescribed you new medication(s) today. Discuss the medications prescribed today with your pharmacist as they can have adverse effects and interactions with your other medicines including over the counter and prescribed medications. Seek medical evaluation if you start to experience new or abnormal symptoms after taking one of these medicines, seek care immediately if you start to experience difficulty breathing, feeling of your throat closing, facial swelling, or rash as these could be indications of a more serious allergic reaction  Please follow-up with urology within 3 days for reevaluation.  Return to the ER for new or worsening symptoms including but not limited to new or worsening pain, pain to your abdomen or flank/back, fever, vomiting, or any other concerns.

## 2020-11-30 NOTE — ED Provider Notes (Signed)
[facility]-EMERGENCY DEPT Provider Note   CSN: [id number] Arrival date & time: 11/30/20  0840     History Chief Complaint  Patient presents with  . Dysuria    FEMALE IAFRATE Stacey [name] is a 55 y.o. female with a hx of fibromyalgia, bipolar 1 disorder, diabetes mellitus, and anemia who presents to the emergency department with urinary symptoms over the past 4 to 5 days.  Patient reports of dysuria and urgency.  She has been trying to drink plenty of water without relief in her symptoms.  No other alleviating or aggravating factors.  She denies fever, chills, nausea, vomiting, flank pain, abdominal pain, vaginal bleeding, or vaginal discharge.  She denies concern for STI.  Denies chance of pregnancy, states she is status post tubal ligation.  HPI     Past Medical History:  Diagnosis Date  . Allergic rhinitis, cause unspecified   . Anxiety   . Anxiety   . Asthma   . 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 Pt has never been tested.   . Bipolar 1 disorder (HCC)   . Complication of anesthesia 2004   bp dropped after c section, took 24hrs to feels leg , numbing medicine does not last long when injected into skin  . Diabetes mellitus without complication (HCC)    pre diabetic  . Difficult intravenous access    hard stick for ivs and lab per pt  . Family history of adverse reaction to anesthesia    mom heart stopped times 2 in or  . Fibromyalgia   . GERD (gastroesophageal reflux disease)   . Headache   . Iron deficiency anemia, unspecified 02/24/2013  . Mental disorder   . Morbid obesity (HCC)   . Nasal bone fracture age 78   left side cannot place tube in nostril per pt  . OSA (obstructive sleep apnea)    uses cpap does not know settings  . Pernicious anemia 02/24/2013  . Thyroid nodule     Patient Active Problem List   Diagnosis Date Noted  . Severe recurrent major depression without psychotic features (HCC)   . Bipolar I disorder, most recent episode  (or current) unspecified 01/09/2015  . Panic attacks 01/09/2015  . Headache(784.0) 07/10/2013  . Depressive disorder 03/08/2013  . Aphthous ulcer 03/01/2013  . Cellulitis 03/01/2013  . Chest pain 02/27/2013  . Iron deficiency anemia, unspecified 02/24/2013  . Pernicious anemia 02/24/2013  . Abnormality of gait 12/09/2012  . Dysphagia, unspecified(787.20) 12/08/2012  . DUB (dysfunctional uterine bleeding) 05/30/2011  . Anemia 05/30/2011  . COUGH 03/29/2010  . SKIN RASH 09/30/2009  . ACUTE BRONCHITIS 08/10/2009  . ALCOHOLISM 08/05/2009  . DIARRHEA 08/05/2009  . SINUSITIS, ACUTE 07/06/2009  . CELIAC SPRUE 07/06/2009  . DISEASE - VOCAL CORD NEC 03/24/2009  . THYROID NODULE 03/04/2009  . Allergic rhinitis, cause unspecified 01/27/2009  . OBSTRUCTIVE SLEEP APNEA 01/26/2009  . SHORTNESS OF BREATH (SOB) 12/22/2008  . FIBROMYALGIA 12/13/2008    Past Surgical History:  Procedure Laterality Date  . CESAREAN SECTION    . COLONOSCOPY WITH PROPOFOL N/A 01/02/2013   Procedure: COLONOSCOPY WITH PROPOFOL;  Surgeon: Louis Meckel, MD;  Location: [facility];  Service: Endoscopy;  Laterality: N/A;  . ESOPHAGOGASTRODUODENOSCOPY N/A 01/02/2013   Procedure: ESOPHAGOGASTRODUODENOSCOPY (EGD);  Surgeon: Louis Meckel, MD;  Location: [facility];  Service: Endoscopy;  Laterality: N/A;  . noraplant    . TUBAL LIGATION    . TUBAL LIGATION Bilateral   . WISDOM TOOTH  EXTRACTION       OB History    Gravida  10   Para  6   Term  6   Preterm      AB  4   Living  6     SAB  4   IAB      Ectopic      Multiple      Live Births              Family History  Problem Relation Age of Onset  . Breast cancer Mother   . Asthma Mother   . Bipolar disorder Mother   . Parkinson's disease Mother   . Emphysema Paternal Grandmother   . Diabetes Father     Social History   Tobacco Use  . Smoking status: Former Smoker    Packs/day: 0.50    Years: 20.00    Pack years: 10.00     Types: Cigarettes    Start date: 02/05/1987    Quit date: 07/30/2006    Years since quitting: 14.3  . Smokeless tobacco: Never Used  . Tobacco comment: quit 7 years ago  Substance Use Topics  . Alcohol use: Yes    Comment: socially  . Drug use: No    Home Medications Prior to Admission medications   Medication Sig Start Date End Date Taking? Authorizing Provider  albuterol (PROVENTIL HFA;VENTOLIN HFA) 108 (90 BASE) MCG/ACT inhaler Inhale 2 puffs into the lungs 3 (three) times daily. 01/21/15   Adonis Brook, NP  clonazePAM (KLONOPIN) 1 MG tablet Take 1 mg by mouth 2 (two) times daily.    [provider]  diphenhydrAMINE (BENADRYL) 50 MG capsule Take 50 mg by mouth every 6 (six) hours as needed.    [provider]  divalproex (DEPAKOTE) 500 MG DR tablet Take 1 tablet (500 mg total) by mouth 2 (two) times daily. 01/21/15   Adonis Brook, NP  DULoxetine (CYMBALTA) 60 MG capsule Take 1 capsule (60 mg total) by mouth daily. 01/21/15   Adonis Brook, NP  gabapentin (NEURONTIN) 300 MG capsule Take 3 capsules (900 mg total) by mouth 3 (three) times daily. 01/21/15   Adonis Brook, NP  hydrOXYzine (ATARAX/VISTARIL) 25 MG tablet Take 1 tablet (25 mg total) by mouth 3 (three) times daily as needed for anxiety. 01/21/15   Adonis Brook, NP  omeprazole (PRILOSEC) 40 MG capsule Take 1 capsule (40 mg total) by mouth daily. 01/21/15   Adonis Brook, NP  rizatriptan (MAXALT) 10 MG tablet Take 10 mg by mouth as needed for migraine. May repeat in 2 hours if needed    [provider]  traMADol (ULTRAM) 50 MG tablet Take 50 mg by mouth every 4 (four) hours as needed. 1 to 2 tabs    [provider]  traZODone (DESYREL) 50 MG tablet Take 1 tablet (50 mg total) by mouth at bedtime as needed for sleep. 01/21/15   Adonis Brook, NP  ziprasidone (GEODON) 80 MG capsule Take 1 capsule (80 mg total) by mouth 2 (two) times daily with a meal. 01/21/15   Adonis Brook, NP     Allergies    Bee venom, Bupropion hcl, Capzasin [capsaicin], Citalopram hydrobromide, Other, Strawberry extract, Lamictal [lamotrigine], and Latex  Review of Systems   Review of Systems  Constitutional: Negative for chills and fever.  Respiratory: Negative for shortness of breath.   Cardiovascular: Negative for chest pain.  Gastrointestinal: Negative for abdominal pain, nausea and vomiting.  Genitourinary: Positive for dysuria and urgency.  Negative for flank pain, vaginal bleeding and vaginal discharge.  Neurological: Negative for syncope.  All other systems reviewed and are negative.   Physical Exam Updated Vital Signs BP (!) 128/95 (BP Location: Left Arm)   Pulse 92   Temp 98.6 F (37 C) (Oral)   Resp 18   LMP 09/21/2015   SpO2 99%   Physical Exam Vitals and nursing note reviewed.  Constitutional:      General: She is not in acute distress.    Appearance: She is well-developed. She is not toxic-appearing.  HENT:     Head: Normocephalic and atraumatic.  Eyes:     General:        Right eye: No discharge.        Left eye: No discharge.     Conjunctiva/sclera: Conjunctivae normal.  Cardiovascular:     Rate and Rhythm: Normal rate and regular rhythm.  Pulmonary:     Effort: Pulmonary effort is normal. No respiratory distress.     Breath sounds: Normal breath sounds. No wheezing, rhonchi or rales.  Abdominal:     General: There is no distension.     Palpations: Abdomen is soft.     Tenderness: There is no abdominal tenderness. There is no right CVA tenderness, left CVA tenderness, guarding or rebound.  Musculoskeletal:     Cervical back: Neck supple.  Skin:    General: Skin is warm and dry.     Findings: No rash.  Neurological:     Mental Status: She is alert.     Comments: Clear speech.   Psychiatric:        Behavior: Behavior normal.     ED Results / Procedures / Treatments   Labs (all labs ordered are listed, but only abnormal results are  displayed) Labs Reviewed  URINALYSIS, ROUTINE W REFLEX MICROSCOPIC - Abnormal; Notable for the following components:      Result Value   Color, Urine STRAW (*)    Specific Gravity, Urine 1.002 (*)    Hgb urine dipstick SMALL (*)    All other components within normal limits  URINE CULTURE    EKG None  Radiology No results found.  Procedures Procedures   Medications Ordered in ED Medications - No data to display  ED Course  I have reviewed the triage vital signs and the nursing notes.  Pertinent labs & imaging results that were available during my care of the patient were reviewed by me and considered in my medical decision making (see chart for details).    MDM Rules/Calculators/A&P                         Patient presents to the ED with complaints of urinary symptoms.  She is nontoxic, resting comfortably, vitals with mildly elevated blood pressure, doubt hypertensive emergency.  Additional history obtained:  Additional history obtained from chart review & nursing note review.   Lab Tests:  I reviewed and interpreted labs, which included:  Urinalysis: Small hemoglobin present, no significant UTI.  A culture was sent by the nursing staff prior to my assessment of the patient.  ED Course:  Urinalysis without obvious UTI.  Patient without CVA tenderness, vomiting, or fever to raise concern for pyelonephritis.  She does not have significant abdominal or flank pain to raise concern for a kidney stone at this time.  She denies concern for STI or vaginal bleeding/discharge.  Abdomen is nontender without peritoneal signs.  There is no ketonuria  or glucosuria present to raise concern for a significant problem with her diabetes at this time.  Unclear definitive etiology to patient's symptoms, urine culture pending, will trial Pyridium with urology follow-up. I discussed results, treatment plan, need for follow-up, and return precautions with the patient. Provided opportunity for  questions, patient confirmed understanding and is in agreement with plan.   Findings and plan of care discussed with supervising physician Dr. Stevie Kern who is in agreement.   Portions of this note were generated with Scientist, clinical (histocompatibility and immunogenetics). Dictation errors may occur despite best attempts at proofreading.  Final Clinical Impression(s) / ED Diagnoses Final diagnoses:  Dysuria    Rx / DC Orders ED Discharge Orders         Ordered    phenazopyridine (PYRIDIUM) 200 MG tablet  3 times daily PRN        11/30/20 0953           Cherly Anderson, PA-C 11/30/20 0955    Milagros Loll, MD 12/01/20 [phone]

## 2020-11-30 NOTE — ED Triage Notes (Signed)
Pt arrived via POV, c/o painful urination, urgency, frequency x4-5 days. Denies any fevers, or any other sx.

## 2020-12-01 LAB — URINE CULTURE

## 2021-01-25 ENCOUNTER — Telehealth: Payer: Self-pay | Admitting: Genetic Counselor

## 2021-01-25 NOTE — Telephone Encounter (Signed)
Received a genetic counselin referral from [facility] for fhx of breast cancer. [name] has been cld and scheduled to see Cari on 7/11 at 9am. Pt aware to arrive 15 minutes early.

## 2021-02-03 ENCOUNTER — Telehealth: Payer: Self-pay | Admitting: Genetic Counselor

## 2021-02-03 NOTE — Telephone Encounter (Signed)
Rescheduled genetics appointment for Monday 8/1 at 8am per patient request.

## 2021-02-06 ENCOUNTER — Inpatient Hospital Stay: Payer: Medicaid Other | Admitting: Genetic Counselor

## 2021-02-06 ENCOUNTER — Inpatient Hospital Stay: Payer: Medicaid Other

## 2021-02-27 ENCOUNTER — Inpatient Hospital Stay: Payer: Medicaid Other

## 2021-02-27 ENCOUNTER — Inpatient Hospital Stay: Payer: Medicaid Other | Attending: Anesthesiology | Admitting: Genetic Counselor

## 2021-02-27 ENCOUNTER — Encounter: Payer: Self-pay | Admitting: Genetic Counselor

## 2021-02-27 ENCOUNTER — Other Ambulatory Visit: Payer: Self-pay

## 2021-02-27 DIAGNOSIS — Z803 Family history of malignant neoplasm of breast: Secondary | ICD-10-CM

## 2021-02-27 LAB — GENETIC SCREENING ORDER

## 2021-02-27 NOTE — Progress Notes (Signed)
REFERRING PROVIDER: Ellender Hose, NP [address],  [place] [address]  PRIMARY PROVIDER:  System, Provider Not In  PRIMARY REASON FOR VISIT:  1. Family history of breast cancer     HISTORY OF PRESENT ILLNESS:   [name], a 55 y.o. female, was seen for a [facility] cancer genetics consultation at the request of Dr. Moshe Salisbury due to a family history of cancer.  [name] presents to clinic today to discuss the possibility of a hereditary predisposition to cancer, to discuss genetic testing, and to further clarify her future cancer risks, as well as potential cancer risks for family members.   [name] is a 55 y.o. female with no personal history of cancer.    CANCER HISTORY:  Oncology History   No history exists.    RISK FACTORS:  Menarche was at age 89.  First live birth at age 75.  OCP use for approximately 6 years.  Ovaries intact: yes.  Hysterectomy: no.  Menopausal status: postmenopausal; menopause at age 40 per patient  HRT use: 0 years. Colonoscopy: yes;  most recent last year; one polyp detected per patient . Mammogram within the last year: yes. Number of breast biopsies: 0. Up to date with pelvic exams: no. Any excessive radiation exposure in the past: no  Past Medical History:  Diagnosis Date   Allergic rhinitis, cause unspecified    Anxiety    Anxiety    Asthma    Beta thalassemia minor Pt has a family hx. Pt has never been tested.    Bipolar 1 disorder (HCC)    Complication of anesthesia 2004   bp dropped after c section, took 24hrs to feels leg , numbing medicine does not last long when injected into skin   Diabetes mellitus without complication (HCC)    pre diabetic   Difficult intravenous access    hard stick for ivs and lab per pt   Family history of adverse reaction to anesthesia    mom heart stopped times 2 in or   Fibromyalgia    GERD (gastroesophageal reflux disease)    Headache    Iron deficiency anemia,  unspecified 02/24/2013   Mental disorder    Morbid obesity (HCC)    Nasal bone fracture age 9   left side cannot place tube in nostril per pt   OSA (obstructive sleep apnea)    uses cpap does not know settings   Pernicious anemia 02/24/2013   Thyroid nodule     Past Surgical History:  Procedure Laterality Date   CESAREAN SECTION     COLONOSCOPY WITH PROPOFOL N/A 01/02/2013   Procedure: COLONOSCOPY WITH PROPOFOL;  Surgeon: Louis Meckel, MD;  Location: [facility];  Service: Endoscopy;  Laterality: N/A;   ESOPHAGOGASTRODUODENOSCOPY N/A 01/02/2013   Procedure: ESOPHAGOGASTRODUODENOSCOPY (EGD);  Surgeon: Louis Meckel, MD;  Location: [facility];  Service: Endoscopy;  Laterality: N/A;   noraplant     TUBAL LIGATION     TUBAL LIGATION Bilateral    WISDOM TOOTH EXTRACTION      Social History   Socioeconomic History   Marital status: Married    Spouse name: [name]   Number of children: 4   Years of education: Bachelors   Highest education level: Not on file  Occupational History   Occupation: Unemployed  Tobacco Use   Smoking status: Former    Packs/day: 0.50    Years: 20.00    Pack years: 10.00    Types: Cigarettes  Start date: 02/05/1987    Quit date: 07/30/2006    Years since quitting: 14.5   Smokeless tobacco: Never   Tobacco comments:    quit 7 years ago  Substance and Sexual Activity   Alcohol use: Yes    Comment: socially   Drug use: No   Sexual activity: Never    Birth control/protection: Surgical  Other Topics Concern   Not on file  Social History Narrative   She lives with her husbandFranklin), 4 children 21, 15, 61, 10, she stays at home.   Patient has a Bachelor's degree.   Patient is right- handed.   Patient drinks one cup of coffee in the morning, sometimes 2 cups.         Social Determinants of Health   Financial Resource Strain: Not on file  Food Insecurity: Not on file  Transportation Needs: Not on file  Physical Activity: Not on file  Stress:  Not on file  Social Connections: Not on file     FAMILY HISTORY:  We obtained a detailed, 4-generation family history.  Significant diagnoses are listed below: Family History  Problem Relation Age of Onset   Breast cancer Mother 64   Asthma Mother    Bipolar disorder Mother    Parkinson's disease Mother    Diabetes Father    Breast cancer Maternal Aunt 60   Esophageal cancer Paternal Aunt        dx >50   Esophageal cancer Paternal Uncle        dx > 47   Breast cancer Maternal Grandmother 60   Emphysema Paternal Grandmother    Colon polyps Paternal Grandfather        unknown #    Ms. Taliana Mersereau is unaware of previous family history of genetic testing for hereditary cancer risks.  There is no reported Ashkenazi Jewish ancestry. There is no known consanguinity.  GENETIC COUNSELING ASSESSMENT: [name] is a 55 y.o. female with a family history of cancer which is somewhat suggestive of a hereditary cancer syndrome and predisposition to cancer given the presence of related cancers in her maternal family. We, therefore, discussed and recommended the following at today's visit.   DISCUSSION: We discussed that 5 - 10% of cancer is hereditary, with most cases of hereditary breast cancer associated with mutations in BRCA1/2.  There are other genes that can be associated with hereditary breast cancer syndromes.  These include but are not limited to CHEK2, ATM, and PALB2.  We discussed that testing is beneficial for several reasons, including knowing about other cancer risks, identifying potential screening and risk-reduction options that may be appropriate, and to understanding if other family members could be at risk for cancer and allowing them to undergo genetic testing.  We reviewed the characteristics, features and inheritance patterns of hereditary cancer syndromes. We also discussed genetic testing, including the appropriate family members to test, the process of testing, insurance  coverage and turn-around-time for results. We discussed the implications of a negative, positive, carrier and/or variant of uncertain significant result. We discussed that negative results would be uninformative given that Ms. Zebedee Iba Husch does not have a personal history of cancer. We recommended Ms. Zebedee Iba Husch pursue genetic testing for a panel that contains genes associated with breast cancer.  Ms. Delcia Spitzley was offered a common hereditary cancer panel (47 genes) and an expanded pan-cancer panel (84 genes). Ms. Chella Chapdelaine was informed of the benefits and limitations of each panel, including that expanded pan-cancer  panels contain several genes that do not have clear management guidelines at this point in time.  We also discussed that as the number of genes included on a panel increases, the chances of variants of uncertain significance increases.  After considering the benefits and limitations of each gene panel, Ms. Zebedee Iba Husch elected to have an expanded pan-cancer panel through [facility].  The Multi-Cancer + RNA Panel offered by [facility] includes sequencing and/or deletion/duplication analysis of the following 84 genes:  AIP*, ALK, APC*, ATM*, AXIN2*, BAP1*, BARD1*, BLM*, BMPR1A*, BRCA1*, BRCA2*, BRIP1*, CASR, CDC73*, CDH1*, CDK4, CDKN1B*, CDKN1C*, CDKN2A, CEBPA, CHEK2*, CTNNA1*, DICER1*, DIS3L2*, EGFR, EPCAM, FH*, FLCN*, GATA2*, GPC3, GREM1, HOXB13, HRAS, KIT, MAX*, MEN1*, MET, MITF, MLH1*, MSH2*, MSH3*, MSH6*, MUTYH*, NBN*, NF1*, NF2*, NTHL1*, PALB2*, PDGFRA, PHOX2B, PMS2*, POLD1*, POLE*, POT1*, PRKAR1A*, PTCH1*, PTEN*, RAD50*, RAD51C*, RAD51D*, RB1*, RECQL4, RET, RUNX1*, SDHA*, SDHAF2*, SDHB*, SDHC*, SDHD*, SMAD4*, SMARCA4*, SMARCB1*, SMARCE1*, STK11*, SUFU*, TERC, TERT, TMEM127*, Tp53*, TSC1*, TSC2*, VHL*, WRN*, and WT1.  RNA analysis is performed for * genes.    Based on Ms. Zebedee Iba Husch's family history of cancer, she meets medical criteria for genetic testing. Despite that she meets  criteria, she may still have an out of pocket cost. We discussed that if her out of pocket cost for testing is over $100, the laboratory will contact her to discuss self-pay options and/or patient pay assistance programs.   We discussed that some people do not want to undergo genetic testing due to fear of genetic discrimination.  A federal law called the Genetic Information Non-Discrimination Act (GINA) of 2008 helps protect individuals against genetic discrimination based on their genetic test results.  It impacts both health insurance and employment.  With health insurance, it protects against increased premiums, being kicked off insurance or being forced to take a test in order to be insured.  For employment it protects against hiring, firing and promoting decisions based on genetic test results.  GINA does not apply to those in the [employer], those who work for companies with less than 15 employees, and new life insurance or long-term disability insurance policies.  Health status due to a cancer diagnosis is not protected under GINA.  PLAN: After considering the risks, benefits, and limitations, Ms. Zebedee Iba Husch provided informed consent to pursue genetic testing and the blood sample was sent to [facility] for analysis of the Multi Cancer +RNA Panel. Results should be available within approximately 3 weeks' time, at which point they will be disclosed by telephone to Ms. Barnet Dulaney [facility], as will any additional recommendations warranted by these results. Ms. Konica Stankowski will receive a summary of her genetic counseling visit and a copy of her results once available. This information will also be available in Epic.   Lastly, we encouraged Ms. Chavez Husch to remain in contact with cancer genetics annually so that we can continuously update the family history and inform her of any changes in cancer genetics and testing that may be of benefit for this family.   Ms. Zebedee Iba Husch's questions were  answered to her satisfaction today. Our contact information was provided should additional questions or concerns arise. Thank you for the referral and allowing [place] to share in the care of your patient.   Creedon Danielski M. Joette Catching, [place], [facility] Genetic Counselor [email or web address] (P) [phone]   The patient was seen for a total of 25 minutes in face-to-face genetic counseling.  The patient was seen alone.  Drs. Magrinat, Lindi Adie and/or Burr Medico were available to discuss this case as needed.  _______________________________________________________________________ For Office Staff:  Number of people involved in session: 1 Was an Intern/ student involved with case: no

## 2021-03-21 ENCOUNTER — Encounter: Payer: Self-pay | Admitting: Genetic Counselor

## 2021-03-21 ENCOUNTER — Telehealth: Payer: Self-pay | Admitting: Genetic Counselor

## 2021-03-21 DIAGNOSIS — Z1379 Encounter for other screening for genetic and chromosomal anomalies: Secondary | ICD-10-CM | POA: Insufficient documentation

## 2021-03-21 NOTE — Telephone Encounter (Signed)
Revealed negative genetic testing.  Discussed that we do not know why there is cancer in the family. It could be familial, due to a change in a gene she did not inherit, due to a different gene that we are not testing, or maybe our current technology may not be able to pick something up.  It will be important for her to keep in contact with genetics to keep up with whether additional testing may be needed.  Recommended genetic testing for her mother and maternal aunt.

## 2021-03-27 ENCOUNTER — Ambulatory Visit: Payer: Self-pay | Admitting: Genetic Counselor

## 2021-03-27 DIAGNOSIS — Z803 Family history of malignant neoplasm of breast: Secondary | ICD-10-CM

## 2021-03-27 DIAGNOSIS — Z1379 Encounter for other screening for genetic and chromosomal anomalies: Secondary | ICD-10-CM

## 2021-03-27 NOTE — Progress Notes (Signed)
HPI:  Stacey [name] was previously seen in the [place] clinic due to a family history of cancer and concerns regarding a hereditary predisposition to cancer. Please refer to our prior cancer genetics clinic note for more information regarding our discussion, assessment and recommendations, at the time. Stacey [name]'s recent genetic test results were disclosed to her, as were recommendations warranted by these results. These results and recommendations are discussed in more detail below.  CANCER HISTORY:  Oncology History   No history exists.    FAMILY HISTORY:  We obtained a detailed, 4-generation family history.  Significant diagnoses are listed below: Family History  Problem Relation Age of Onset   Breast cancer Mother 42   Breast cancer Maternal Aunt 48   Esophageal cancer Paternal Aunt        dx >50   Esophageal cancer Paternal Uncle        dx > 44   Breast cancer Maternal Grandmother 31   Colon polyps Paternal Grandfather        unknown #     Ms. Elonna Mcfarlane is unaware of previous family history of genetic testing for hereditary cancer risks.  There is no reported Ashkenazi Jewish ancestry. There is no known consanguinity.  GENETIC TEST RESULTS: Genetic testing reported out on March 20, 2021.  The [facility] Multi-Cancer +RNA Panel found no pathogenic mutations. The Multi-Cancer + RNA Panel offered by [facility] includes sequencing and/or deletion/duplication analysis of the following 84 genes:  AIP*, ALK, APC*, ATM*, AXIN2*, BAP1*, BARD1*, BLM*, BMPR1A*, BRCA1*, BRCA2*, BRIP1*, CASR, CDC73*, CDH1*, CDK4, CDKN1B*, CDKN1C*, CDKN2A, CEBPA, CHEK2*, CTNNA1*, DICER1*, DIS3L2*, EGFR, EPCAM, FH*, FLCN*, GATA2*, GPC3, GREM1, HOXB13, HRAS, KIT, MAX*, MEN1*, MET, MITF, MLH1*, MSH2*, MSH3*, MSH6*, MUTYH*, NBN*, NF1*, NF2*, NTHL1*, PALB2*, PDGFRA, PHOX2B, PMS2*, POLD1*, POLE*, POT1*, PRKAR1A*, PTCH1*, PTEN*, RAD50*, RAD51C*, RAD51D*, RB1*, RECQL4, RET, RUNX1*, SDHA*, SDHAF2*,  SDHB*, SDHC*, SDHD*, SMAD4*, SMARCA4*, SMARCB1*, SMARCE1*, STK11*, SUFU*, TERC, TERT, TMEM127*, Tp53*, TSC1*, TSC2*, VHL*, WRN*, and WT1.  RNA analysis is performed for * genes.  The test report has been scanned into EPIC and is located under the Molecular Pathology section of the Results Review tab.  A portion of the result report is included below for reference.    We discussed with Stacey [name] that because current genetic testing is not perfect, it is possible there may be a gene mutation in one of these genes that current testing cannot detect, but that chance is small.  We also discussed, that there could be another gene that has not yet been discovered, or that we have not yet tested, that is responsible for the cancer diagnoses in the family. It is also possible there is a hereditary cause for the cancer in the family that Ms. Chavez [name] did not inherit and therefore was not identified in her testing.  Therefore, it is important to remain in touch with cancer genetics in the future so that we can continue to offer Ms. Chavez [name] the most up to date genetic testing.   ADDITIONAL GENETIC TESTING: We discussed with Stacey [name] that her genetic testing was fairly extensive.  If there are genes identified to increase cancer risk that can be analyzed in the future, we would be happy to discuss and coordinate this testing at that time.    CANCER SCREENING RECOMMENDATIONS: Stacey [name]'s test result is considered negative (normal).  This means that we have not identified a hereditary cause for her family history of  cancer at this time.   While reassuring, this does not definitively rule out a hereditary predisposition to cancer. It is still possible that there could be genetic mutations that are undetectable by current technology. There could be genetic mutations in genes that have not been tested or identified to increase cancer risk.  Therefore, it is recommended she continue to follow  the cancer management and screening guidelines provided by her primary healthcare provider.   An individual's cancer risk and medical management are not determined by genetic test results alone. Overall cancer risk assessment incorporates additional factors, including personal medical history, family history, and any available genetic information that may result in a personalized plan for cancer prevention and surveillance  RECOMMENDATIONS FOR FAMILY MEMBERS:  Individuals in this family might be at some increased risk of developing cancer, over the general population risk, simply due to the family history of cancer.  We recommended women in this family have a yearly mammogram beginning at age 73, or 4 years younger than the earliest onset of cancer, an annual clinical breast exam, and perform monthly breast self-exams. Women in this family should also have a gynecological exam as recommended by their primary provider. Family members should be referred for colonoscopy starting at age 25, or earlier, as recommended by their providers.   It is also possible there is a hereditary cause for the cancer in Stacey [name]'s family that she did not inherit and therefore was not identified in her.  Based on Stacey [name]'s family history, we recommended her mother, who was diagnosed with breast cancer at age 62, have genetic counseling and testing. Ms. Namrata Dangler will let us know if we can be of any assistance in coordinating genetic counseling and/or testing for this family member.   FOLLOW-UP: Lastly, we discussed with Stacey [name] that cancer genetics is a rapidly advancing field and it is possible that new genetic tests will be appropriate for her and/or her family members in the future. We encouraged her to remain in contact with cancer genetics on an annual basis so we can update her personal and family histories and let her know of advances in cancer genetics that may benefit this family.   Our  contact number was provided. Stacey [name]'s questions were answered to her satisfaction, and she knows she is welcome to call us at anytime with additional questions or concerns.     Emrie Gayle M. Joette Catching, [place], [facility] Genetic Counselor [email or web address] (P) [phone]

## 2021-09-22 ENCOUNTER — Other Ambulatory Visit: Payer: Self-pay

## 2021-09-22 ENCOUNTER — Encounter (HOSPITAL_COMMUNITY): Payer: Self-pay

## 2021-09-22 ENCOUNTER — Emergency Department (HOSPITAL_COMMUNITY): Payer: Medicaid Other

## 2021-09-22 ENCOUNTER — Emergency Department (HOSPITAL_COMMUNITY)
Admission: EM | Admit: 2021-09-22 | Discharge: 2021-09-22 | Disposition: A | Discharge status: Home / Self Care | Payer: Medicaid Other | Attending: Emergency Medicine | Admitting: Emergency Medicine

## 2021-09-22 DIAGNOSIS — M546 Pain in thoracic spine: Secondary | ICD-10-CM | POA: Insufficient documentation

## 2021-09-22 DIAGNOSIS — R300 Dysuria: Secondary | ICD-10-CM | POA: Diagnosis not present

## 2021-09-22 DIAGNOSIS — Z91040 Latex allergy status: Secondary | ICD-10-CM | POA: Insufficient documentation

## 2021-09-22 LAB — BASIC METABOLIC PANEL
Anion gap: 7 (ref 5–15)
BUN: 10 mg/dL (ref 6–20)
CO2: 24 mmol/L (ref 22–32)
Calcium: 9.2 mg/dL (ref 8.9–10.3)
Chloride: 104 mmol/L (ref 98–111)
Creatinine, Ser: 0.65 mg/dL (ref 0.44–1.00)
GFR, Estimated: >60 mL/min (ref >60)
Glucose, Bld: 113 mg/dL — ABNORMAL HIGH (ref 70–99)
Potassium: 4.4 mmol/L (ref 3.5–5.1)
Sodium: 135 mmol/L (ref 135–145)

## 2021-09-22 LAB — CBC WITH DIFFERENTIAL/PLATELET
Abs Immature Granulocytes: 0.03 10*3/uL (ref 0.00–0.07)
Basophils Absolute: 0 10*3/uL (ref 0.0–0.1)
Basophils Relative: 0 %
Eosinophils Absolute: 0.1 10*3/uL (ref 0.0–0.5)
Eosinophils Relative: 1 %
HCT: 43.4 % (ref 36.0–46.0)
Hemoglobin: 14.2 g/dL (ref 12.0–15.0)
Immature Granulocytes: 0 %
Lymphocytes Relative: 5 %
Lymphs Abs: 0.6 10*3/uL — ABNORMAL LOW (ref 0.7–4.0)
MCH: 29.4 pg (ref 26.0–34.0)
MCHC: 32.7 g/dL (ref 30.0–36.0)
MCV: 89.9 fL (ref 80.0–100.0)
Monocytes Absolute: 0.3 10*3/uL (ref 0.1–1.0)
Monocytes Relative: 3 %
Neutro Abs: 10.5 10*3/uL — ABNORMAL HIGH (ref 1.7–7.7)
Neutrophils Relative %: 91 %
Platelets: 284 10*3/uL (ref 150–400)
RBC: 4.83 MIL/uL (ref 3.87–5.11)
RDW: 12.7 % (ref 11.5–15.5)
WBC: 11.4 10*3/uL — ABNORMAL HIGH (ref 4.0–10.5)
nRBC: 0 % (ref 0.0–0.2)

## 2021-09-22 LAB — URINALYSIS, ROUTINE W REFLEX MICROSCOPIC
Bacteria, UA: NONE SEEN
Bilirubin Urine: NEGATIVE
Glucose, UA: NEGATIVE mg/dL
Ketones, ur: NEGATIVE mg/dL
Leukocytes,Ua: NEGATIVE
Nitrite: NEGATIVE
Protein, ur: NEGATIVE mg/dL
Specific Gravity, Urine: 1.004 — ABNORMAL LOW (ref 1.005–1.030)
pH: 7 (ref 5.0–8.0)

## 2021-09-22 MED ORDER — KETOROLAC TROMETHAMINE 30 MG/ML IJ SOLN
15.0000 mg | Freq: Once | INTRAMUSCULAR | Status: AC
  Administered 2021-09-22: 15 mg via INTRAMUSCULAR
  Filled 2021-09-22: qty 1

## 2021-09-22 NOTE — Discharge Instructions (Addendum)
Your urine did not show infection.  Hopefully the steroids will help with the next few days.  Follow-up with the doctors as needed

## 2021-09-22 NOTE — ED Notes (Signed)
Bladder scan showed 43ml after pt voided

## 2021-09-22 NOTE — ED Provider Notes (Signed)
[facility]ENC[facility]PT Provider Note   CSN: [id number] Arrival date & time: 09/22/21  1112     History  Chief Complaint  Patient presents with   Urinary Retention    Stacey [name] is a 56 y.o. female.  HPI Patient presents with mid back pain after a fall couple weeks ago.  States tripped and landed on her left knee.  Has had continued pain.  Went to see [facility] 2 days ago and was started on steroid pack but first took a dose yesterday morning.  States now she has had more difficulty urinating.  States did not urinate since last night.  After arriving here was able to urinate and had postvoid residual of 5 mL.  Pain is dull.  No weakness.  Does have a history of chronic back pain.    Home Medications Prior to Admission medications   Medication Sig Start Date End Date Taking? Authorizing Provider  albuterol (PROVENTIL HFA;VENTOLIN HFA) 108 (90 BASE) MCG/ACT inhaler Inhale 2 puffs into the lungs 3 (three) times daily. 01/21/15   Adonis Brook, NP  clonazePAM (KLONOPIN) 1 MG tablet Take 1 mg by mouth 2 (two) times daily.    [provider]  diphenhydrAMINE (BENADRYL) 50 MG capsule Take 50 mg by mouth every 6 (six) hours as needed.    [provider]  divalproex (DEPAKOTE) 500 MG DR tablet Take 1 tablet (500 mg total) by mouth 2 (two) times daily. 01/21/15   Adonis Brook, NP  DULoxetine (CYMBALTA) 60 MG capsule Take 1 capsule (60 mg total) by mouth daily. 01/21/15   Adonis Brook, NP  gabapentin (NEURONTIN) 300 MG capsule Take 3 capsules (900 mg total) by mouth 3 (three) times daily. 01/21/15   Adonis Brook, NP  hydrOXYzine (ATARAX/VISTARIL) 25 MG tablet Take 1 tablet (25 mg total) by mouth 3 (three) times daily as needed for anxiety. 01/21/15   Adonis Brook, NP  omeprazole (PRILOSEC) 40 MG capsule Take 1 capsule (40 mg total) by mouth daily. 01/21/15   Adonis Brook, NP  phenazopyridine (PYRIDIUM) 200 MG tablet Take 1 tablet  (200 mg total) by mouth 3 (three) times daily as needed for pain. 11/30/20   Petrucelli, Samantha R, PA-C  rizatriptan (MAXALT) 10 MG tablet Take 10 mg by mouth as needed for migraine. May repeat in 2 hours if needed    [provider]  traMADol (ULTRAM) 50 MG tablet Take 50 mg by mouth every 4 (four) hours as needed. 1 to 2 tabs    [provider]  traZODone (DESYREL) 50 MG tablet Take 1 tablet (50 mg total) by mouth at bedtime as needed for sleep. 01/21/15   Adonis Brook, NP  ziprasidone (GEODON) 80 MG capsule Take 1 capsule (80 mg total) by mouth 2 (two) times daily with a meal. 01/21/15   Adonis Brook, NP      Allergies    Bee venom, Bupropion hcl, Capzasin [capsaicin], Citalopram hydrobromide, Other, Strawberry extract, Lamictal [lamotrigine], and Latex    Review of Systems   Review of Systems  Constitutional:  Negative for appetite change.  Respiratory:  Negative for shortness of breath.   Genitourinary:  Positive for difficulty urinating. Negative for dysuria and flank pain.  Musculoskeletal:  Positive for back pain.  Skin:  Negative for rash.  Neurological:  Negative for weakness and numbness.   Physical Exam Updated Vital Signs BP 108/80 (BP Location: Right Arm)    Pulse 80    Temp 98 F (36.7  C) (Oral)    Resp 16    Ht 5' 8.5" (1.74 m)    Wt 108.9 kg    LMP 09/21/2015    SpO2 99%    BMI 35.96 kg/m  Physical Exam Vitals and nursing note reviewed.  HENT:     Head: Atraumatic.  Cardiovascular:     Rate and Rhythm: Regular rhythm.  Abdominal:     Tenderness: There is no abdominal tenderness.  Musculoskeletal:     Cervical back: Neck supple.     Comments: Tenderness over lower thoracic spine.  No deformity.  No crepitus step-off.  Neurovascular intact in bilateral lower extremities.  Skin:    General: Skin is warm.     Capillary Refill: Capillary refill takes less than 2 seconds.  Neurological:     Mental Status: She is alert.    ED Results /  Procedures / Treatments   Labs (all labs ordered are listed, but only abnormal results are displayed) Labs Reviewed  URINALYSIS, ROUTINE W REFLEX MICROSCOPIC - Abnormal; Notable for the following components:      Result Value   Color, Urine STRAW (*)    Specific Gravity, Urine 1.004 (*)    Hgb urine dipstick SMALL (*)    All other components within normal limits  CBC WITH DIFFERENTIAL/PLATELET - Abnormal; Notable for the following components:   WBC 11.4 (*)    Neutro Abs 10.5 (*)    Lymphs Abs 0.6 (*)    All other components within normal limits  BASIC METABOLIC PANEL - Abnormal; Notable for the following components:   Glucose, Bld 113 (*)    All other components within normal limits    EKG None  Radiology DG Thoracic Spine 2 View  Result Date: 09/22/2021 CLINICAL DATA:  Back pain after fall 3 weeks ago EXAM: THORACIC SPINE 2 VIEWS COMPARISON:  02/17/2015 FINDINGS: There is no evidence of thoracic spine fracture. Alignment is normal. No other significant bone abnormalities are identified. IMPRESSION: Negative. Electronically Signed   By: Duanne Guess D.O.   On: 09/22/2021 17:24    Procedures Procedures    Medications Ordered in ED Medications  ketorolac (TORADOL) 30 MG/ML injection 15 mg (15 mg Intramuscular Given 09/22/21 1808)    ED Course/ Medical Decision Making/ A&P                           Medical Decision Making Amount and/or Complexity of Data Reviewed Labs: ordered. Radiology: ordered.  Risk Prescription drug management.   Patient with back pain.  Has had after a fall.  History of chronic lower back pain but usually does not get in thoracic back.  Reportedly sent in because she had difficulty urinating.  No urinary retention here.  Urine did not show infection had been given steroid pack by orthopedics.  Has only had 1 dose.  X-ray done reassuring.  Doubt severe obstruction at thoracic level.  Do not feel that she needs admission or further work-up at  this time.  Do not feel she needs an MRI.  Doubt this is severe obstruction causing urinary retention.  Discharge home.        Final Clinical Impression(s) / ED Diagnoses Final diagnoses:  Acute thoracic back pain, unspecified back pain laterality    Rx / DC Orders ED Discharge Orders     None         Benjiman Core, MD 09/23/21 [phone]

## 2021-09-22 NOTE — ED Provider Triage Note (Addendum)
Emergency Medicine Provider Triage Evaluation Note  Stacey [name] , a 56 y.o. female  was evaluated in triage.  Pt complains of back pain.  Patient states that she fell about 3 weeks ago and immediately had some mid thoracic back pain following this event.  She continued to have this until she was seen at PCP on 22 February.  She was placed on a steroid pack.  She states that she took 2 doses of this, and last night she noticed that her pain and migrated a little bit lower than it was before.  She also noticed that she had the inability to urinate.  She states that she last urinated at 2200 last night.  Since then, she has dribbled a little bit but cannot empty her back bladder.  She states that since she has been here in the emergency department, she did have a normal urination with a normal amount.  She has never had urinary retention before.  She denies any dysuria or hematuria.  She denies any saddle anesthesia, weakness, or numbness of lower extremities.  She denies bowel or bladder incontinence.  He has not had any fevers or chills.  Denies history of diabetes or cancer.  Review of Systems  Positive: Back pain, urinary retention Negative:   Physical Exam  BP 134/89 (BP Location: Left Arm)    Pulse 93    Temp 98.3 F (36.8 C) (Oral)    Resp 18    Ht 5' 8.5" (1.74 m)    Wt 108.9 kg    LMP 09/21/2015    SpO2 100%    BMI 35.96 kg/m  Gen:   Awake, no distress   Resp:  Normal effort  MSK:   Moves extremities without difficulty  Other:  Thoracic midline ttp. No step offs.   Medical Decision Making  Medically screening exam initiated at 12:13 PM.  Appropriate orders placed.  Mena Goes Husch was informed that the remainder of the evaluation will be completed by another provider, this initial triage assessment does not replace that evaluation, and the importance of remaining in the ED until their evaluation is complete.  Will order labs. Patient will be reevaluated in the back for  consideration of MRI given urinary incontinence. I am less suspicion of spinal cord issue now that patient has urinated normally.    Adolphus Birchwood, PA-C 09/22/21 1217    Adolphus Birchwood, PA-C 09/22/21 1226

## 2021-09-22 NOTE — ED Triage Notes (Signed)
Patient states she has been using a steroid pack for mid back pain due to a fall 3 weeks ago. Patient now c/o left lower back pain. Today, the patient states she last urinated at 2200 last night. Today, the patient states she dribbled earlier, but did not empty.

## 2022-05-08 ENCOUNTER — Encounter: Payer: Self-pay | Admitting: Hematology & Oncology

## 2022-05-08 ENCOUNTER — Other Ambulatory Visit (HOSPITAL_BASED_OUTPATIENT_CLINIC_OR_DEPARTMENT_OTHER): Payer: Self-pay

## 2022-05-08 MED ORDER — HYDROCODONE-ACETAMINOPHEN 10-325 MG PO TABS
1.0000 | ORAL_TABLET | Freq: Four times a day (QID) | ORAL | 0 refills | Status: DC | PRN
  Filled 2022-05-08: qty 120, 30d supply, fill #0

## 2022-05-30 ENCOUNTER — Other Ambulatory Visit (HOSPITAL_BASED_OUTPATIENT_CLINIC_OR_DEPARTMENT_OTHER): Payer: Self-pay

## 2022-05-30 MED ORDER — HYDROCODONE-ACETAMINOPHEN 10-325 MG PO TABS
1.0000 | ORAL_TABLET | Freq: Every day | ORAL | 0 refills | Status: DC
  Filled 2022-05-30 – 2022-06-08 (×2): qty 180, 30d supply, fill #0

## 2022-06-08 ENCOUNTER — Other Ambulatory Visit (HOSPITAL_BASED_OUTPATIENT_CLINIC_OR_DEPARTMENT_OTHER): Payer: Self-pay

## 2022-08-04 ENCOUNTER — Other Ambulatory Visit: Payer: Self-pay

## 2022-08-04 ENCOUNTER — Emergency Department (HOSPITAL_COMMUNITY)
Admission: EM | Admit: 2022-08-04 | Discharge: 2022-08-04 | Disposition: A | Discharge status: Home / Self Care | Payer: Medicaid Other | Attending: Emergency Medicine | Admitting: Emergency Medicine

## 2022-08-04 ENCOUNTER — Emergency Department (HOSPITAL_COMMUNITY): Payer: Medicaid Other

## 2022-08-04 ENCOUNTER — Emergency Department (HOSPITAL_BASED_OUTPATIENT_CLINIC_OR_DEPARTMENT_OTHER): Payer: Medicaid Other

## 2022-08-04 DIAGNOSIS — R0602 Shortness of breath: Secondary | ICD-10-CM | POA: Insufficient documentation

## 2022-08-04 DIAGNOSIS — E119 Type 2 diabetes mellitus without complications: Secondary | ICD-10-CM | POA: Diagnosis not present

## 2022-08-04 DIAGNOSIS — R609 Edema, unspecified: Secondary | ICD-10-CM

## 2022-08-04 DIAGNOSIS — Z87891 Personal history of nicotine dependence: Secondary | ICD-10-CM | POA: Diagnosis not present

## 2022-08-04 DIAGNOSIS — Z91040 Latex allergy status: Secondary | ICD-10-CM | POA: Insufficient documentation

## 2022-08-04 DIAGNOSIS — Z1152 Encounter for screening for COVID-19: Secondary | ICD-10-CM | POA: Diagnosis not present

## 2022-08-04 DIAGNOSIS — R600 Localized edema: Secondary | ICD-10-CM | POA: Diagnosis not present

## 2022-08-04 LAB — CBC WITH DIFFERENTIAL/PLATELET
Abs Immature Granulocytes: 0.02 10*3/uL (ref 0.00–0.07)
Basophils Absolute: 0 10*3/uL (ref 0.0–0.1)
Basophils Relative: 0 %
Eosinophils Absolute: 0.1 10*3/uL (ref 0.0–0.5)
Eosinophils Relative: 2 %
HCT: 35.1 % — ABNORMAL LOW (ref 36.0–46.0)
Hemoglobin: 11.1 g/dL — ABNORMAL LOW (ref 12.0–15.0)
Immature Granulocytes: 0 %
Lymphocytes Relative: 15 %
Lymphs Abs: 1.1 10*3/uL (ref 0.7–4.0)
MCH: 27.7 pg (ref 26.0–34.0)
MCHC: 31.6 g/dL (ref 30.0–36.0)
MCV: 87.5 fL (ref 80.0–100.0)
Monocytes Absolute: 0.4 10*3/uL (ref 0.1–1.0)
Monocytes Relative: 6 %
Neutro Abs: 6 10*3/uL (ref 1.7–7.7)
Neutrophils Relative %: 77 %
Platelets: 241 10*3/uL (ref 150–400)
RBC: 4.01 MIL/uL (ref 3.87–5.11)
RDW: 14.6 % (ref 11.5–15.5)
WBC: 7.8 10*3/uL (ref 4.0–10.5)
nRBC: 0 % (ref 0.0–0.2)

## 2022-08-04 LAB — COMPREHENSIVE METABOLIC PANEL
ALT: 15 U/L (ref 0–44)
AST: 21 U/L (ref 15–41)
Albumin: 2.9 g/dL — ABNORMAL LOW (ref 3.5–5.0)
Alkaline Phosphatase: 81 U/L (ref 38–126)
Anion gap: 10 (ref 5–15)
BUN: 15 mg/dL (ref 6–20)
CO2: 24 mmol/L (ref 22–32)
Calcium: 8.6 mg/dL — ABNORMAL LOW (ref 8.9–10.3)
Chloride: 109 mmol/L (ref 98–111)
Creatinine, Ser: 0.68 mg/dL (ref 0.44–1.00)
GFR, Estimated: >60 mL/min (ref >60)
Glucose, Bld: 132 mg/dL — ABNORMAL HIGH (ref 70–99)
Potassium: 3.8 mmol/L (ref 3.5–5.1)
Sodium: 143 mmol/L (ref 135–145)
Total Bilirubin: 0.2 mg/dL — ABNORMAL LOW (ref 0.3–1.2)
Total Protein: 6.5 g/dL (ref 6.5–8.1)

## 2022-08-04 LAB — RESP PANEL BY RT-PCR (RSV, FLU A&B, COVID)  RVPGX2
Influenza A by PCR: NEGATIVE
Influenza B by PCR: NEGATIVE
Resp Syncytial Virus by PCR: NEGATIVE
SARS Coronavirus 2 by RT PCR: NEGATIVE

## 2022-08-04 LAB — BRAIN NATRIURETIC PEPTIDE: B Natriuretic Peptide: 141.3 pg/mL — ABNORMAL HIGH (ref 0.0–100.0)

## 2022-08-04 LAB — TROPONIN I (HIGH SENSITIVITY): Troponin I (High Sensitivity): 3 ng/L (ref <18)

## 2022-08-04 MED ORDER — IOHEXOL 350 MG/ML SOLN
75.0000 mL | Freq: Once | INTRAVENOUS | Status: AC | PRN
  Administered 2022-08-04: 75 mL via INTRAVENOUS

## 2022-08-04 MED ORDER — ALBUTEROL SULFATE HFA 108 (90 BASE) MCG/ACT IN AERS: 1.0000 | INHALATION_SPRAY | Freq: Four times a day (QID) | RESPIRATORY_TRACT | 0 refills | Status: AC | PRN

## 2022-08-04 MED ORDER — IPRATROPIUM-ALBUTEROL 0.5-2.5 (3) MG/3ML IN SOLN
3.0000 mL | Freq: Once | RESPIRATORY_TRACT | Status: AC
  Administered 2022-08-04: 3 mL via RESPIRATORY_TRACT
  Filled 2022-08-04: qty 3

## 2022-08-04 MED ORDER — IPRATROPIUM-ALBUTEROL 0.5-2.5 (3) MG/3ML IN SOLN
RESPIRATORY_TRACT | Status: AC
  Filled 2022-08-04: qty 3

## 2022-08-04 MED ORDER — CEFADROXIL 500 MG PO CAPS: 500.0000 mg | ORAL_CAPSULE | Freq: Two times a day (BID) | ORAL | 0 refills | Status: DC

## 2022-08-04 NOTE — ED Triage Notes (Signed)
Patient reports feeling SOB, bilateral leg swelling. Has echo and other things scheduled for next week. Reports she is depressed.

## 2022-08-04 NOTE — ED Provider Notes (Signed)
[facility] DEPT Provider Note   CSN: [id number] Arrival date & time: 08/04/22  1409     History  Chief Complaint  Patient presents with   Shortness of Breath   Chest Pain   Leg Swelling    Zoelle Vielma is a 57 y.o. female.   Shortness of Breath Associated symptoms: chest pain   Chest Pain Associated symptoms: shortness of breath     57 year old female presents emergency department with complaints of shortness of breath, chest pain, bilateral leg swelling. Patient reports symptoms beginning approximately 1 week ago, but symptoms have progressively worsened since then. Was seen at psychiatrist on Wednesday and waited noted 10 pound weight gain between Wednesday and Friday. Patient had no personal history of heart failure but father with send diagnosis. Denies fever, chills, night sweats, cough, congestion, abdominal pain.   Past medical history significant for GERD, obesity, OSA, bipolar 1 disorder, fibromyalgia, diabetes mellitus  Home Medications Prior to Admission medications   Medication Sig Start Date End Date Taking? Authorizing Provider  albuterol (VENTOLIN HFA) 108 (90 Base) MCG/ACT inhaler Inhale 1-2 puffs into the lungs every 6 (six) hours as needed for wheezing or shortness of breath. 08/04/22  Yes Dion Saucier A, PA  cefadroxil (DURICEF) 500 MG capsule Take 1 capsule (500 mg total) by mouth 2 (two) times daily. 08/04/22  Yes Dion Saucier A, PA  clonazePAM (KLONOPIN) 1 MG tablet Take 1 mg by mouth 2 (two) times daily.    [provider]  diphenhydrAMINE (BENADRYL) 50 MG capsule Take 50 mg by mouth every 6 (six) hours as needed.    [provider]  divalproex (DEPAKOTE) 500 MG DR tablet Take 1 tablet (500 mg total) by mouth 2 (two) times daily. 01/21/15   Kerrie Buffalo, NP  DULoxetine (CYMBALTA) 60 MG capsule Take 1 capsule (60 mg total) by mouth daily. 01/21/15   Kerrie Buffalo, NP  gabapentin (NEURONTIN) 300  MG capsule Take 3 capsules (900 mg total) by mouth 3 (three) times daily. 01/21/15   Kerrie Buffalo, NP  HYDROcodone-acetaminophen (NORCO) 10-325 MG tablet Take 1 tablet by mouth 4 (four) times daily as needed for pain. 05/08/22     HYDROcodone-acetaminophen (NORCO) 10-325 MG tablet Take 1 tablet by mouth 6 (six) times daily as needed for pain. 05/30/22     hydrOXYzine (ATARAX/VISTARIL) 25 MG tablet Take 1 tablet (25 mg total) by mouth 3 (three) times daily as needed for anxiety. 01/21/15   Kerrie Buffalo, NP  omeprazole (PRILOSEC) 40 MG capsule Take 1 capsule (40 mg total) by mouth daily. 01/21/15   Kerrie Buffalo, NP  phenazopyridine (PYRIDIUM) 200 MG tablet Take 1 tablet (200 mg total) by mouth 3 (three) times daily as needed for pain. 11/30/20   Petrucelli, Samantha R, PA-C  rizatriptan (MAXALT) 10 MG tablet Take 10 mg by mouth as needed for migraine. May repeat in 2 hours if needed    [provider]  traMADol (ULTRAM) 50 MG tablet Take 50 mg by mouth every 4 (four) hours as needed. 1 to 2 tabs    [provider]  traZODone (DESYREL) 50 MG tablet Take 1 tablet (50 mg total) by mouth at bedtime as needed for sleep. 01/21/15   Kerrie Buffalo, NP  ziprasidone (GEODON) 80 MG capsule Take 1 capsule (80 mg total) by mouth 2 (two) times daily with a meal. 01/21/15   Kerrie Buffalo, NP      Allergies    Bee venom, Bupropion hcl,  Capzasin [capsaicin], Citalopram hydrobromide, Other, Strawberry extract, Lamictal [lamotrigine], and Latex    Review of Systems   Review of Systems  Respiratory:  Positive for shortness of breath.   Cardiovascular:  Positive for chest pain.  All other systems reviewed and are negative.   Physical Exam Updated Vital Signs BP 135/75   Pulse (!) 57   Temp 98.7 F (37.1 C) (Oral)   Resp 13   LMP 09/21/2015   SpO2 99%  Physical Exam Vitals and nursing note reviewed.  Constitutional:      General: She is not in acute distress.    Appearance: She is  well-developed.  HENT:     Head: Normocephalic and atraumatic.  Eyes:     Conjunctiva/sclera: Conjunctivae normal.  Neck:     Vascular: No JVD.  Cardiovascular:     Rate and Rhythm: Normal rate and regular rhythm.     Heart sounds: No murmur heard. Pulmonary:     Effort: Pulmonary effort is normal. No respiratory distress.     Breath sounds: Normal breath sounds. No wheezing, rhonchi or rales.  Abdominal:     Palpations: Abdomen is soft.     Tenderness: There is no abdominal tenderness.  Musculoskeletal:        General: No swelling.     Cervical back: Neck supple.     Right lower leg: No edema.     Left lower leg: No edema.     Comments: Bilateral lower extremity swelling with no obvious pitting edema.  Mild area of erythema/pink skin noted on the right anterior shin.  Area mildly warmer to the touch than surrounding skin.  Palpable pedal pulses bilaterally.  Compartments soft and supple.  Skin:    General: Skin is warm and dry.     Capillary Refill: Capillary refill takes less than 2 seconds.  Neurological:     Mental Status: She is alert.  Psychiatric:        Mood and Affect: Mood normal.     ED Results / Procedures / Treatments   Labs (all labs ordered are listed, but only abnormal results are displayed) Labs Reviewed  COMPREHENSIVE METABOLIC PANEL - Abnormal; Notable for the following components:      Result Value   Glucose, Bld 132 (*)    Calcium 8.6 (*)    Albumin 2.9 (*)    Total Bilirubin 0.2 (*)    All other components within normal limits  CBC WITH DIFFERENTIAL/PLATELET - Abnormal; Notable for the following components:   Hemoglobin 11.1 (*)    HCT 35.1 (*)    All other components within normal limits  BRAIN NATRIURETIC PEPTIDE - Abnormal; Notable for the following components:   B Natriuretic Peptide 141.3 (*)    All other components within normal limits  RESP PANEL BY RT-PCR (RSV, FLU A&B, COVID)  RVPGX2  TROPONIN I (HIGH SENSITIVITY)  TROPONIN I (HIGH  SENSITIVITY)    EKG EKG Interpretation  Date/Time:  Saturday August 04 2022 14:22:13 EST Ventricular Rate:  66 PR Interval:  147 QRS Duration: 88 QT Interval:  404 QTC Calculation: 424 R Axis:   76 Text Interpretation: Sinus rhythm Confirmed by Lajean Saver [id number]) on 08/04/2022 3:16:19 PM  Radiology VAS [place] LOWER EXTREMITY VENOUS (DVT) (7a-7p)  Result Date: 08/04/2022  Lower Venous DVT Study Patient Name:  RAFAELLA SHADDUCK [facility]  Date of Exam:   08/04/2022 Medical Rec #: [id number]  Accession #:    [id number] Date of Birth: [birth year]                 Patient Gender: F Patient Age:   78 years Exam Location:  [facility] Procedure:      VAS [place] LOWER EXTREMITY VENOUS (DVT) Referring Phys: Zaedyn Covin --------------------------------------------------------------------------------  Indications: Edema and SOB x1 week, suspicion for PE.  Comparison Study: No prior studies. Performing Technologist: Darlin Coco RDMS, RVT  Examination Guidelines: A complete evaluation includes B-mode imaging, spectral Doppler, color Doppler, and power Doppler as needed of all accessible portions of each vessel. Bilateral testing is considered an integral part of a complete examination. Limited examinations for reoccurring indications may be performed as noted. The reflux portion of the exam is performed with the patient in reverse Trendelenburg.  +---------+---------------+---------+-----------+----------+--------------+ RIGHT    CompressibilityPhasicitySpontaneityPropertiesThrombus Aging +---------+---------------+---------+-----------+----------+--------------+ CFV      Full           Yes      Yes                                 +---------+---------------+---------+-----------+----------+--------------+ SFJ      Full                                                        +---------+---------------+---------+-----------+----------+--------------+ FV Prox  Full                                                         +---------+---------------+---------+-----------+----------+--------------+ FV Mid   Full                                                        +---------+---------------+---------+-----------+----------+--------------+ FV DistalFull                                                        +---------+---------------+---------+-----------+----------+--------------+ PFV      Full                                                        +---------+---------------+---------+-----------+----------+--------------+ POP      Full           Yes      Yes                                 +---------+---------------+---------+-----------+----------+--------------+ PTV      Full                                                        +---------+---------------+---------+-----------+----------+--------------+  PERO     Full                                                        +---------+---------------+---------+-----------+----------+--------------+ Gastroc  Full                                                        +---------+---------------+---------+-----------+----------+--------------+   +---------+---------------+---------+-----------+----------+--------------+ LEFT     CompressibilityPhasicitySpontaneityPropertiesThrombus Aging +---------+---------------+---------+-----------+----------+--------------+ CFV      Full           Yes      Yes                                 +---------+---------------+---------+-----------+----------+--------------+ SFJ      Full                                                        +---------+---------------+---------+-----------+----------+--------------+ FV Prox  Full                                                        +---------+---------------+---------+-----------+----------+--------------+ FV Mid   Full                                                         +---------+---------------+---------+-----------+----------+--------------+ FV DistalFull                                                        +---------+---------------+---------+-----------+----------+--------------+ PFV      Full                                                        +---------+---------------+---------+-----------+----------+--------------+ POP      Full           Yes      Yes                                 +---------+---------------+---------+-----------+----------+--------------+ PTV      Full                                                        +---------+---------------+---------+-----------+----------+--------------+  PERO     Full                                                        +---------+---------------+---------+-----------+----------+--------------+ Gastroc  Full                                                        +---------+---------------+---------+-----------+----------+--------------+    Summary: RIGHT: - There is no evidence of deep vein thrombosis in the lower extremity.  - No cystic structure found in the popliteal fossa.  LEFT: - There is no evidence of deep vein thrombosis in the lower extremity.  - No cystic structure found in the popliteal fossa.  *See table(s) above for measurements and observations.    Preliminary    CT Angio Chest PE W/Cm &/Or Wo Cm  Result Date: 08/04/2022 CLINICAL DATA:  Leg swelling and chest pain. EXAM: CT ANGIOGRAPHY CHEST WITH CONTRAST TECHNIQUE: Multidetector CT imaging of the chest was performed using the standard protocol during bolus administration of intravenous contrast. Multiplanar CT image reconstructions and MIPs were obtained to evaluate the vascular anatomy. RADIATION DOSE REDUCTION: This exam was performed according to the departmental dose-optimization program which includes automated exposure control, adjustment of the mA and/or kV according to patient size and/or use of  iterative reconstruction technique. CONTRAST:  49mL OMNIPAQUE IOHEXOL 350 MG/ML SOLN COMPARISON:  CT PE protocol 02/24/2009 FINDINGS: Cardiovascular: Satisfactory opacification of the pulmonary arteries to the segmental level. No evidence of pulmonary embolism. Normal heart size. No pericardial effusion. Mediastinum/Nodes: No enlarged mediastinal, hilar, or axillary lymph nodes. Esophagus and trachea are within normal limits. There is a posterior left thyroid nodule measuring up to 2.7 cm which has increased in size. Lungs/Pleura: Lungs are clear. No pleural effusion or pneumothorax. Upper Abdomen: No acute abnormality. Musculoskeletal: No chest wall abnormality. No acute or significant osseous findings. Review of the MIP images confirms the above findings. IMPRESSION: 1. No evidence for pulmonary embolism or other acute cardiopulmonary process. 2. 2.7 cm incidental left thyroid nodule has increased in size. Recommend non-emergent thyroid ultrasound. Reference: J Am Coll Radiol. 2015 Feb;12(2): 143-50 Electronically Signed   By: Ronney Asters M.D.   On: 08/04/2022 16:52   DG Chest 2 View  Result Date: 08/04/2022 CLINICAL DATA:  Shortness of breath EXAM: CHEST - 2 VIEW COMPARISON:  February 17, 2015 FINDINGS: The heart size and mediastinal contours are within normal limits. Both lungs are clear. The visualized skeletal structures are unremarkable. IMPRESSION: No active cardiopulmonary disease. Electronically Signed   By: Dorise Bullion III M.D.   On: 08/04/2022 14:57    Procedures Procedures    Medications Ordered in ED Medications  ipratropium-albuterol (DUONEB) 0.5-2.5 (3) MG/3ML nebulizer solution 3 mL (has no administration in time range)  iohexol (OMNIPAQUE) 350 MG/ML injection 75 mL (75 mLs Intravenous Contrast Given 08/04/22 1630)    ED Course/ Medical Decision Making/ A&P                           Medical Decision Making Amount and/or Complexity of Data Reviewed Labs: ordered. Radiology:  ordered.  Risk Prescription drug management.  This patient presents to the ED for concern of shortness of breath, this involves an extensive number of treatment options, and is a complaint that carries with it a high risk of complications and morbidity.  The differential diagnosis includes The causes for shortness of breath include but are not limited to Cardiac (AHF, pericardial effusion and tamponade, arrhythmias, ischemia, etc) Respiratory (COPD, asthma, pneumonia, pneumothorax, primary pulmonary hypertension, PE/VQ mismatch) Hematological (anemia)  Co morbidities that complicate the patient evaluation  See HPI   Additional history obtained:  Additional history obtained from EMR External records from outside source obtained and reviewed including hospital records   Lab Tests:  I Ordered, and personally interpreted labs.  The pertinent results include: No leukocytosis noted.  Mild evidence anemia with hemoglobin 11.1 of which is decreased in patient's baseline hemoglobin; patient without current complaints of hematochezia/melena or known blood loss elsewhere.  Platelets in normal range.  Respiratory viral panel negative.  BNP mildly elevated 141.3.  Initial troponin of 3 with no repeat obtained given weeks duration of symptoms; EKG   Imaging Studies ordered:  I ordered imaging studies including chest x-ray I independently visualized and interpreted imaging which showed no active cardiopulmonary process I agree with the radiologist interpretation   Cardiac Monitoring: / EKG:  The patient was maintained on a cardiac monitor.  I personally viewed and interpreted the cardiac monitored which showed an underlying rhythm of: Sinus rhythm with nonspecific ST change in lead III with no reciprocal change.   Consultations Obtained:  N/a   Problem List / ED Course / Critical interventions / Medication management  Shortness of breath/leg swelling I ordered medication including  DuoNeb   Reevaluation of the patient after these medicines showed that the patient improved I have reviewed the patients home medicines and have made adjustments as needed   Social Determinants of Health:  Former cigarette use.  Denies illicit drug use.   Test / Admission - Considered:  Shortness of breath/leg swelling Vitals signs within normal range and stable throughout visit. Laboratory/imaging studies significant for: See above Patient with feelings of shortness of breath as well as lower extremity swelling..  Patient without evidence of ACS, PE, pneumonia, pneumothorax, aortic dissection, CHF exacerbation, DVT, acute ischemic limb, compartment syndrome today.  Patient mildly anemic with a hemoglobin 11.1 of which she is reporting no known source of blood and declined rectal exam to assess for melena/hematochezia.  Recommend reassessment via PCP/cardiologist for repeat blood draw.  Physical exam entirely benign besides lower extremity swelling which is nonpitting in nature with possible cellulitic right lower extremity.  Patient recommended continued symptomatic therapy at home, lower extremity swelling with compression stockings, elevation of lower legs above the level of heart as well as monitoring liquid intake.  Discussed with patient regarding administering diuretic given mild elevation of BNP and patient reported lower extremity swelling from baseline patient declined at this time due to close follow with cardiologist on Monday with echocardiogram to be planned.  This seems reasonable given patient without hypoxia and no acute respiratory distress with overall reassuring workup today.  Treatment plan were discussed at length with patient and they knowledge understanding was agreeable to said plan.   Worrisome signs and symptoms were discussed with the patient and the patient knowledge understanding to return to emergency department if notice.  Patient stable upon discharge.           Final Clinical Impression(s) / ED Diagnoses Final diagnoses:  Shortness of breath  Peripheral edema  Rx / DC Orders ED Discharge Orders          Ordered    albuterol (VENTOLIN HFA) 108 (90 Base) MCG/ACT inhaler  Every 6 hours PRN        08/04/22 1705    cefadroxil (DURICEF) 500 MG capsule  2 times daily        08/04/22 1705              Peter Garter, [place] 08/04/22 1719    Cathren Laine, MD 08/04/22 2051

## 2022-08-04 NOTE — Progress Notes (Signed)
Lower extremity venous bilateral study completed.  Preliminary results relayed to [facility], [place].  See CV Proc for preliminary results report.   Darlin Coco, RDMS, RVT

## 2022-08-04 NOTE — ED Provider Triage Note (Signed)
Emergency Medicine Provider Triage Evaluation Note  Stacey [name] , a 57 y.o. female  was evaluated in triage.  Pt complains of shortness of breath, chest pain, bilateral leg swelling.  Patient reports symptoms beginning approximately 1 week ago.  Was seen at psychiatrist on Wednesday and waited noted 10 pound weight gain between Wednesday and Friday.  Patient had no personal history of heart failure but father with same symptoms.  Denies fever, chills, night sweats, cough, congestion, abdominal pain..  Review of Systems  Positive: See above Negative:   Physical Exam  BP (!) 140/78   Pulse 63   Temp 98.7 F (37.1 C) (Oral)   Resp 16   LMP 09/21/2015   SpO2 100%  Gen:   Awake, no distress   Resp:  Normal effort  MSK:   Moves extremities without difficulty  Other:  Bilateral lower extremity edema.  Medical Decision Making  Medically screening exam initiated at 2:31 PM.  Appropriate orders placed.  Stacey [name] was informed that the remainder of the evaluation will be completed by another provider, this initial triage assessment does not replace that evaluation, and the importance of remaining in the ED until their evaluation is complete.     Stacey [name], [place] 08/04/22 1432

## 2022-08-04 NOTE — Discharge Instructions (Addendum)
Note the workup today was overall reassuring.  As discussed, there is no evidence for acute heart failure, heart attack, blood clot in the lungs, pneumonia, collapsed lung.  I will send an albuterol inhaler to use as needed as well as antibiotic for redness seen on your lower extremities.  Regarding your lower extremity swelling, recommend elevation of your legs above the level of your heart, compression stockings as well as moderating your fluid intake.  Keep your upcoming appointment with cardiologist for echocardiogram on Monday.  Please advise you to return to emergency department for worrisome signs and symptoms we discussed become apparent.

## 2022-11-09 ENCOUNTER — Emergency Department (HOSPITAL_COMMUNITY): Payer: Medicaid Other

## 2022-11-09 ENCOUNTER — Emergency Department (HOSPITAL_COMMUNITY)
Admission: EM | Admit: 2022-11-09 | Discharge: 2022-11-10 | Disposition: A | Discharge status: Home / Self Care | Payer: Medicaid Other | Attending: Emergency Medicine | Admitting: Emergency Medicine

## 2022-11-09 ENCOUNTER — Encounter (HOSPITAL_COMMUNITY): Payer: Self-pay

## 2022-11-09 ENCOUNTER — Other Ambulatory Visit: Payer: Self-pay

## 2022-11-09 DIAGNOSIS — Z91040 Latex allergy status: Secondary | ICD-10-CM | POA: Diagnosis not present

## 2022-11-09 DIAGNOSIS — M79641 Pain in right hand: Secondary | ICD-10-CM | POA: Insufficient documentation

## 2022-11-09 DIAGNOSIS — M25531 Pain in right wrist: Secondary | ICD-10-CM | POA: Insufficient documentation
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
## 2022-11-16 ENCOUNTER — Encounter: Payer: Self-pay | Admitting: Hematology & Oncology

## 2022-11-16 ENCOUNTER — Ambulatory Visit (HOSPITAL_COMMUNITY)
Admission: EM | Admit: 2022-11-16 | Discharge: 2022-11-17 | Disposition: A | Discharge status: Other Acute Inpatient Hospital | Payer: Medicaid Other | Attending: Nurse Practitioner | Admitting: Nurse Practitioner

## 2022-11-16 DIAGNOSIS — F1290 Cannabis use, unspecified, uncomplicated: Secondary | ICD-10-CM | POA: Insufficient documentation

## 2022-11-16 DIAGNOSIS — F411 Generalized anxiety disorder: Secondary | ICD-10-CM | POA: Insufficient documentation

## 2022-11-16 DIAGNOSIS — Z1152 Encounter for screening for COVID-19: Secondary | ICD-10-CM | POA: Diagnosis not present

## 2022-11-16 DIAGNOSIS — Z653 Problems related to other legal circumstances: Secondary | ICD-10-CM | POA: Insufficient documentation

## 2022-11-16 DIAGNOSIS — R45851 Suicidal ideations: Secondary | ICD-10-CM | POA: Insufficient documentation

## 2022-11-16 DIAGNOSIS — K589 Irritable bowel syndrome without diarrhea: Secondary | ICD-10-CM | POA: Insufficient documentation

## 2022-11-16 DIAGNOSIS — M1990 Unspecified osteoarthritis, unspecified site: Secondary | ICD-10-CM | POA: Insufficient documentation

## 2022-11-16 DIAGNOSIS — F3132 Bipolar disorder, current episode depressed, moderate: Secondary | ICD-10-CM | POA: Diagnosis not present

## 2022-11-16 DIAGNOSIS — Z79899 Other long term (current) drug therapy: Secondary | ICD-10-CM | POA: Insufficient documentation

## 2022-11-16 DIAGNOSIS — M797 Fibromyalgia: Secondary | ICD-10-CM | POA: Insufficient documentation

## 2022-11-16 DIAGNOSIS — F603 Borderline personality disorder: Secondary | ICD-10-CM | POA: Diagnosis not present

## 2022-11-16 DIAGNOSIS — G43909 Migraine, unspecified, not intractable, without status migrainosus: Secondary | ICD-10-CM | POA: Insufficient documentation

## 2022-11-16 DIAGNOSIS — Z5900 Homelessness unspecified: Secondary | ICD-10-CM | POA: Insufficient documentation

## 2022-11-16 DIAGNOSIS — Z9152 Personal history of nonsuicidal self-harm: Secondary | ICD-10-CM | POA: Insufficient documentation

## 2022-11-16 LAB — POCT URINE DRUG SCREEN - MANUAL ENTRY (I-SCREEN)
POC Amphetamine UR: NOT DETECTED
POC Buprenorphine (BUP): NOT DETECTED
POC Cocaine UR: NOT DETECTED
POC Marijuana UR: POSITIVE — AB
POC Methadone UR: NOT DETECTED
POC Methamphetamine UR: NOT DETECTED
POC Morphine: POSITIVE — AB
POC Oxazepam (BZO): NOT DETECTED
POC Oxycodone UR: NOT DETECTED
POC Secobarbital (BAR): NOT DETECTED

## 2022-11-16 LAB — POC SARS CORONAVIRUS 2 AG: SARSCOV2ONAVIRUS 2 AG: NEGATIVE

## 2022-11-16 LAB — POCT PREGNANCY, URINE: Preg Test, Ur: NEGATIVE

## 2022-11-16 MED ORDER — ALUM & MAG HYDROXIDE-SIMETH 200-200-20 MG/5ML PO SUSP: 30.0000 mL | ORAL | Status: DC | PRN

## 2022-11-16 MED ORDER — ACETAMINOPHEN 325 MG PO TABS
650.0000 mg | ORAL_TABLET | Freq: Four times a day (QID) | ORAL | Status: DC | PRN
  Administered 2022-11-17 (×2): 650 mg via ORAL
  Filled 2022-11-16 (×2): qty 2

## 2022-11-16 MED ORDER — PANTOPRAZOLE SODIUM 40 MG PO TBEC
80.0000 mg | DELAYED_RELEASE_TABLET | Freq: Every day | ORAL | Status: DC
  Administered 2022-11-17: 80 mg via ORAL
  Filled 2022-11-16: qty 2

## 2022-11-16 MED ORDER — TRAZODONE HCL 50 MG PO TABS
50.0000 mg | ORAL_TABLET | Freq: Every evening | ORAL | Status: DC | PRN
  Filled 2022-11-16: qty 1

## 2022-11-16 MED ORDER — MAGNESIUM HYDROXIDE 400 MG/5ML PO SUSP: 30.0000 mL | Freq: Every day | ORAL | Status: DC | PRN

## 2022-11-16 MED ORDER — DULOXETINE HCL 60 MG PO CPEP
60.0000 mg | ORAL_CAPSULE | Freq: Every day | ORAL | Status: DC
  Administered 2022-11-17: 60 mg via ORAL
  Filled 2022-11-16: qty 1

## 2022-11-16 MED ORDER — HYDROXYZINE HCL 25 MG PO TABS
25.0000 mg | ORAL_TABLET | Freq: Three times a day (TID) | ORAL | Status: DC | PRN
  Administered 2022-11-17: 25 mg via ORAL
  Filled 2022-11-16: qty 1

## 2022-11-16 MED ORDER — ALBUTEROL SULFATE HFA 108 (90 BASE) MCG/ACT IN AERS: 1.0000 | INHALATION_SPRAY | Freq: Four times a day (QID) | RESPIRATORY_TRACT | Status: DC | PRN
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
## 2022-11-17 LAB — CBC WITH DIFFERENTIAL/PLATELET
Abs Immature Granulocytes: 0.02 10*3/uL (ref 0.00–0.07)
Basophils Absolute: 0 10*3/uL (ref 0.0–0.1)
Basophils Relative: 0 %
Eosinophils Absolute: 0.1 10*3/uL (ref 0.0–0.5)
Eosinophils Relative: 2 %
HCT: 38.8 % (ref 36.0–46.0)
Hemoglobin: 12.8 g/dL (ref 12.0–15.0)
Immature Granulocytes: 0 %
Lymphocytes Relative: 17 %
Lymphs Abs: 1.5 10*3/uL (ref 0.7–4.0)
MCH: 28.9 pg (ref 26.0–34.0)
MCHC: 33 g/dL (ref 30.0–36.0)
MCV: 87.6 fL (ref 80.0–100.0)
Monocytes Absolute: 0.5 10*3/uL (ref 0.1–1.0)
Monocytes Relative: 6 %
Neutro Abs: 6.6 10*3/uL (ref 1.7–7.7)
Neutrophils Relative %: 75 %
Platelets: 307 10*3/uL (ref 150–400)
RBC: 4.43 MIL/uL (ref 3.87–5.11)
RDW: 12.2 % (ref 11.5–15.5)
WBC: 8.9 10*3/uL (ref 4.0–10.5)
nRBC: 0 % (ref 0.0–0.2)

## 2022-11-17 LAB — LIPID PANEL
Cholesterol: 205 mg/dL — ABNORMAL HIGH (ref 0–200)
HDL: 92 mg/dL (ref >40)
LDL Cholesterol: 98 mg/dL (ref 0–99)
Total CHOL/HDL Ratio: 2.2 RATIO
Triglycerides: 76 mg/dL (ref <150)
VLDL: 15 mg/dL (ref 0–40)

## 2022-11-17 LAB — ETHANOL: Alcohol, Ethyl (B): <10 mg/dL (ref <10)

## 2022-11-17 LAB — SARS CORONAVIRUS 2 BY RT PCR: SARS Coronavirus 2 by RT PCR: NEGATIVE

## 2022-11-17 MED ORDER — NAPROXEN 500 MG PO TABS: 500.0000 mg | ORAL_TABLET | Freq: Two times a day (BID) | ORAL | Status: DC | PRN

## 2022-11-17 MED ORDER — MELOXICAM 7.5 MG PO TABS
7.5000 mg | ORAL_TABLET | Freq: Two times a day (BID) | ORAL | Status: DC
  Administered 2022-11-17 (×2): 7.5 mg via ORAL
  Filled 2022-11-17 (×2): qty 1

## 2022-11-17 MED ORDER — NICOTINE POLACRILEX 2 MG MT GUM
2.0000 mg | CHEWING_GUM | OROMUCOSAL | Status: DC | PRN
  Administered 2022-11-17: 2 mg via ORAL
  Filled 2022-11-17: qty 1

## 2022-11-17 MED ORDER — TIZANIDINE HCL 2 MG PO TABS
4.0000 mg | ORAL_TABLET | Freq: Four times a day (QID) | ORAL | Status: DC | PRN
  Administered 2022-11-17: 4 mg via ORAL
  Filled 2022-11-17: qty 2
  Filled 2022-11-17: qty 1

## 2022-11-17 MED ORDER — NICOTINE POLACRILEX 2 MG MT GUM
CHEWING_GUM | OROMUCOSAL | Status: AC
  Filled 2022-11-17: qty 1

## 2022-11-17 MED ORDER — ONDANSETRON 4 MG PO TBDP: 4.0000 mg | ORAL_TABLET | Freq: Four times a day (QID) | ORAL | Status: DC | PRN

## 2022-11-17 MED ORDER — NICOTINE POLACRILEX 2 MG MT GUM
2.0000 mg | CHEWING_GUM | Freq: Once | OROMUCOSAL | Status: AC
  Administered 2022-11-17: 2 mg via ORAL

## 2022-11-17 MED ORDER — LAMOTRIGINE 100 MG PO TABS
200.0000 mg | ORAL_TABLET | Freq: Two times a day (BID) | ORAL | Status: DC
  Administered 2022-11-17 (×2): 200 mg via ORAL
  Filled 2022-11-17 (×2): qty 2

## 2022-11-17 MED ORDER — METHOCARBAMOL 500 MG PO TABS
500.0000 mg | ORAL_TABLET | Freq: Three times a day (TID) | ORAL | Status: DC | PRN
  Administered 2022-11-17: 500 mg via ORAL
  Filled 2022-11-17: qty 1

## 2022-11-17 MED ORDER — LOPERAMIDE HCL 2 MG PO CAPS: 2.0000 mg | ORAL_CAPSULE | ORAL | Status: DC | PRN

## 2022-11-17 MED ORDER — DICYCLOMINE HCL 20 MG PO TABS: 20.0000 mg | ORAL_TABLET | Freq: Four times a day (QID) | ORAL | Status: DC | PRN

## 2022-11-17 NOTE — ED Notes (Signed)
Pt relaxing in bed.  Appears less anxious.  Patrice NP in to speak with her about medication.  Staff will cont to monitor for safety.

## 2022-11-17 NOTE — Progress Notes (Signed)
Pt was accepted to [facility] TODAY 11/17/2022; Bed Assignment will be provided by [facility]; pending items signed voluntary consent to be faxed to [facility] [phone].  Pt meets inpatient criteria per Liborio Nixon, NP  Attending Physician will be: Dr. Phineas Inches, MD  Report can be called to: Adult unit: [phone]  Pt can arrive after: [facility] AC will coordinate with Care Team.  Care Team notified: Liborio Nixon, NP, Rosey Bath, RN, Annie Paras, RN    Kelton Pillar, LCSWA 11/17/2022 @ 6:30 PM

## 2022-11-17 NOTE — ED Notes (Signed)
Patient request nicotine gum 

## 2022-11-17 NOTE — ED Notes (Signed)
Pt is A & O x 4. Pt is oriented to the unit and provided with meal. pt is lying on his bed.Will continue to monitor for safety.

## 2022-11-17 NOTE — ED Notes (Signed)
Safe transport here for patient to take her to [facility]
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
## 2022-11-17 NOTE — ED Notes (Signed)
Safe transport called again - per staff, someone will call soon

## 2022-11-17 NOTE — ED Notes (Signed)
Report called to [facility] amd safe transport

## 2022-11-17 NOTE — ED Notes (Signed)
Pt resting at this time. Breathing even and unlabored.  

## 2022-11-17 NOTE — ED Provider Notes (Cosign Needed Addendum)
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 Negative.   Gastrointestinal: Negative.   Genitourinary: Negative.   Musculoskeletal:  Positive for myalgias.  Neurological: Negative.   Endo/Heme/Allergies: Negative.   Psychiatric/Behavioral:  Positive for depression and suicidal ideas.    Blood pressure 132/67, pulse 70, temperature 98.4 F (36.9 C), temperature source Oral, resp. rate 18, last menstrual period 09/21/2015, SpO2 100 %. There is no height or weight on file to calculate BMI.  Treatment Plan Summary: Patient is recommended for inapatient psychiatric treatment for active SI and worsening depressive symptoms. Patient is voluntary. Psychiatry CSW to seek appropriate placement. Patient accepted to [facility] today.   Medications Restart Lamictal 200 mg po BID. Restart Zanaflex 4 mg Q6H for muscle spasms (home dose 6 mg Q8H prn) Continue Cymbalta 60 mg p.o. daily Add COWS to monitor for opiate withdrawal. Consider adding clonidine if patient develops withdrawal symptoms.  Layla Barter, NP 11/17/2022 10:17 AM

## 2022-11-17 NOTE — ED Notes (Signed)
Patient with no sxs of distress noted at this time - will continue to monitor for safety

## 2022-11-17 NOTE — ED Notes (Signed)
Pharm is currently with the pt completing the med rec

## 2022-11-17 NOTE — Discharge Instructions (Signed)
Transfer to [facility] 

## 2022-11-18 ENCOUNTER — Encounter (HOSPITAL_COMMUNITY): Payer: Self-pay | Admitting: Behavioral Health

## 2022-11-18 ENCOUNTER — Other Ambulatory Visit: Payer: Self-pay

## 2022-11-18 ENCOUNTER — Inpatient Hospital Stay (HOSPITAL_COMMUNITY)
Admission: AD | Admit: 2022-11-18 | Discharge: 2022-11-23 | DRG: 885 | Disposition: A | Discharge status: Home / Self Care | Payer: Medicaid Other | Source: Intra-hospital | Attending: Psychiatry | Admitting: Psychiatry

## 2022-11-18 DIAGNOSIS — F419 Anxiety disorder, unspecified: Secondary | ICD-10-CM | POA: Diagnosis present

## 2022-11-18 DIAGNOSIS — K5900 Constipation, unspecified: Secondary | ICD-10-CM | POA: Diagnosis present

## 2022-11-18 DIAGNOSIS — Z818 Family history of other mental and behavioral disorders: Secondary | ICD-10-CM

## 2022-11-18 DIAGNOSIS — Z20822 Contact with and (suspected) exposure to covid-19: Secondary | ICD-10-CM | POA: Diagnosis present

## 2022-11-18 DIAGNOSIS — F411 Generalized anxiety disorder: Secondary | ICD-10-CM | POA: Diagnosis present

## 2022-11-18 DIAGNOSIS — R112 Nausea with vomiting, unspecified: Secondary | ICD-10-CM | POA: Diagnosis present

## 2022-11-18 DIAGNOSIS — R45851 Suicidal ideations: Secondary | ICD-10-CM | POA: Diagnosis present

## 2022-11-18 DIAGNOSIS — E119 Type 2 diabetes mellitus without complications: Secondary | ICD-10-CM | POA: Diagnosis present

## 2022-11-18 DIAGNOSIS — Z560 Unemployment, unspecified: Secondary | ICD-10-CM | POA: Diagnosis not present

## 2022-11-18 DIAGNOSIS — Z5900 Homelessness unspecified: Secondary | ICD-10-CM

## 2022-11-18 DIAGNOSIS — Z5941 Food insecurity: Secondary | ICD-10-CM | POA: Diagnosis not present

## 2022-11-18 DIAGNOSIS — M62838 Other muscle spasm: Secondary | ICD-10-CM | POA: Diagnosis present

## 2022-11-18 DIAGNOSIS — M797 Fibromyalgia: Secondary | ICD-10-CM | POA: Diagnosis present

## 2022-11-18 DIAGNOSIS — J45909 Unspecified asthma, uncomplicated: Secondary | ICD-10-CM | POA: Diagnosis present

## 2022-11-18 DIAGNOSIS — Z148 Genetic carrier of other disease: Secondary | ICD-10-CM | POA: Diagnosis not present

## 2022-11-18 DIAGNOSIS — G4700 Insomnia, unspecified: Secondary | ICD-10-CM | POA: Diagnosis present

## 2022-11-18 DIAGNOSIS — Z87891 Personal history of nicotine dependence: Secondary | ICD-10-CM | POA: Diagnosis not present

## 2022-11-18 DIAGNOSIS — K219 Gastro-esophageal reflux disease without esophagitis: Secondary | ICD-10-CM | POA: Diagnosis present

## 2022-11-18 DIAGNOSIS — F314 Bipolar disorder, current episode depressed, severe, without psychotic features: Secondary | ICD-10-CM | POA: Diagnosis present

## 2022-11-18 DIAGNOSIS — Z791 Long term (current) use of non-steroidal anti-inflammatories (NSAID): Secondary | ICD-10-CM

## 2022-11-18 DIAGNOSIS — Z79899 Other long term (current) drug therapy: Secondary | ICD-10-CM | POA: Diagnosis not present

## 2022-11-18 MED ORDER — WHITE PETROLATUM EX OINT
TOPICAL_OINTMENT | CUTANEOUS | Status: AC
  Filled 2022-11-18: qty 5

## 2022-11-18 MED ORDER — ACETAMINOPHEN 325 MG PO TABS
650.0000 mg | ORAL_TABLET | Freq: Four times a day (QID) | ORAL | Status: DC | PRN
  Administered 2022-11-18 – 2022-11-19 (×4): 650 mg via ORAL
  Filled 2022-11-18 (×4): qty 2

## 2022-11-18 MED ORDER — LAMOTRIGINE 100 MG PO TABS
200.0000 mg | ORAL_TABLET | Freq: Two times a day (BID) | ORAL | Status: DC
  Administered 2022-11-18 – 2022-11-23 (×10): 200 mg via ORAL
  Filled 2022-11-18 (×15): qty 2

## 2022-11-18 MED ORDER — ONDANSETRON 4 MG PO TBDP: 4.0000 mg | ORAL_TABLET | Freq: Four times a day (QID) | ORAL | Status: AC | PRN

## 2022-11-18 MED ORDER — ALBUTEROL SULFATE HFA 108 (90 BASE) MCG/ACT IN AERS: 1.0000 | INHALATION_SPRAY | Freq: Four times a day (QID) | RESPIRATORY_TRACT | Status: DC | PRN

## 2022-11-18 MED ORDER — HALOPERIDOL LACTATE 5 MG/ML IJ SOLN: 5.0000 mg | Freq: Three times a day (TID) | INTRAMUSCULAR | Status: DC | PRN

## 2022-11-18 MED ORDER — DULOXETINE HCL 60 MG PO CPEP
60.0000 mg | ORAL_CAPSULE | Freq: Every day | ORAL | Status: DC
  Administered 2022-11-18 – 2022-11-23 (×6): 60 mg via ORAL
  Filled 2022-11-18 (×10): qty 1

## 2022-11-18 MED ORDER — LORAZEPAM 1 MG PO TABS: 2.0000 mg | ORAL_TABLET | Freq: Three times a day (TID) | ORAL | Status: DC | PRN

## 2022-11-18 MED ORDER — MAGNESIUM HYDROXIDE 400 MG/5ML PO SUSP
30.0000 mL | Freq: Every day | ORAL | Status: DC | PRN
  Administered 2022-11-20 – 2022-11-23 (×3): 30 mL via ORAL
  Filled 2022-11-18 (×3): qty 30

## 2022-11-18 MED ORDER — LOPERAMIDE HCL 2 MG PO CAPS: 2.0000 mg | ORAL_CAPSULE | ORAL | Status: AC | PRN

## 2022-11-18 MED ORDER — HALOPERIDOL 5 MG PO TABS: 5.0000 mg | ORAL_TABLET | Freq: Three times a day (TID) | ORAL | Status: DC | PRN

## 2022-11-18 MED ORDER — DIPHENHYDRAMINE HCL 25 MG PO CAPS: 50.0000 mg | ORAL_CAPSULE | Freq: Three times a day (TID) | ORAL | Status: DC | PRN

## 2022-11-18 MED ORDER — DIPHENHYDRAMINE HCL 50 MG/ML IJ SOLN: 50.0000 mg | Freq: Three times a day (TID) | INTRAMUSCULAR | Status: DC | PRN

## 2022-11-18 MED ORDER — MELOXICAM 7.5 MG PO TABS
7.5000 mg | ORAL_TABLET | Freq: Two times a day (BID) | ORAL | Status: DC
  Administered 2022-11-18 – 2022-11-23 (×11): 7.5 mg via ORAL
  Filled 2022-11-18 (×16): qty 1

## 2022-11-18 MED ORDER — ALUM & MAG HYDROXIDE-SIMETH 200-200-20 MG/5ML PO SUSP: 30.0000 mL | ORAL | Status: DC | PRN

## 2022-11-18 MED ORDER — LORAZEPAM 2 MG/ML IJ SOLN: 2.0000 mg | Freq: Three times a day (TID) | INTRAMUSCULAR | Status: DC | PRN

## 2022-11-18 MED ORDER — HYDROXYZINE HCL 25 MG PO TABS
25.0000 mg | ORAL_TABLET | Freq: Three times a day (TID) | ORAL | Status: DC | PRN
  Administered 2022-11-18 – 2022-11-22 (×7): 25 mg via ORAL
  Filled 2022-11-18 (×8): qty 1

## 2022-11-18 MED ORDER — NICOTINE POLACRILEX 2 MG MT GUM
2.0000 mg | CHEWING_GUM | OROMUCOSAL | Status: DC | PRN
  Administered 2022-11-18 – 2022-11-23 (×11): 2 mg via ORAL
  Filled 2022-11-18 (×4): qty 1

## 2022-11-18 MED ORDER — DICYCLOMINE HCL 20 MG PO TABS: 20.0000 mg | ORAL_TABLET | Freq: Four times a day (QID) | ORAL | Status: AC | PRN

## 2022-11-18 MED ORDER — TRAZODONE HCL 50 MG PO TABS
50.0000 mg | ORAL_TABLET | Freq: Every evening | ORAL | Status: DC | PRN
  Administered 2022-11-18 – 2022-11-21 (×5): 50 mg via ORAL
  Filled 2022-11-18 (×5): qty 1

## 2022-11-18 MED ORDER — PANTOPRAZOLE SODIUM 40 MG PO TBEC
80.0000 mg | DELAYED_RELEASE_TABLET | Freq: Every day | ORAL | Status: DC
  Administered 2022-11-18 – 2022-11-23 (×4): 80 mg via ORAL
  Filled 2022-11-18 (×9): qty 2

## 2022-11-18 MED ORDER — LAMOTRIGINE 200 MG PO TABS
200.0000 mg | ORAL_TABLET | Freq: Two times a day (BID) | ORAL | Status: DC
  Administered 2022-11-18: 200 mg via ORAL
  Filled 2022-11-18 (×3): qty 1

## 2022-11-18 MED ORDER — TIZANIDINE HCL 2 MG PO TABS
4.0000 mg | ORAL_TABLET | Freq: Four times a day (QID) | ORAL | Status: DC | PRN
  Administered 2022-11-18 – 2022-11-22 (×10): 4 mg via ORAL
  Filled 2022-11-18 (×10): qty 2

## 2022-11-18 NOTE — Progress Notes (Signed)
   11/18/22 0800  Psych Admission Type (Psych Patients Only)  Admission Status Voluntary  Psychosocial Assessment  Patient Complaints Anxiety;Depression  Eye Contact Fair  Facial Expression Anxious  Affect Depressed;Anxious  Speech Logical/coherent  Interaction Assertive  Motor Activity Slow  Appearance/Hygiene In scrubs  Behavior Characteristics Appropriate to situation;Cooperative  Mood Depressed;Anxious;Sad  Aggressive Behavior  Effect No apparent injury  Thought Process  Coherency WDL  Content WDL  Delusions None reported or observed  Hallucination None reported or observed  Judgment Poor  Confusion None  Danger to Self  Current suicidal ideation? Denies  Agreement Not to Harm Self Yes  Description of Agreement Patient agreed to safety plan  Danger to Others  Danger to Others None reported or observed

## 2022-11-18 NOTE — Progress Notes (Signed)
   11/18/22 2000  Psych Admission Type (Psych Patients Only)  Admission Status Voluntary  Psychosocial Assessment  Patient Complaints Anxiety  Eye Contact Fair  Facial Expression Anxious  Affect Anxious;Depressed  Speech Logical/coherent  Interaction Assertive  Motor Activity Slow  Appearance/Hygiene Unremarkable  Behavior Characteristics Appropriate to situation  Mood Depressed  Aggressive Behavior  Effect No apparent injury  Thought Process  Coherency WDL  Content WDL  Delusions WDL  Perception WDL  Hallucination None reported or observed  Judgment Poor  Confusion None  Danger to Self  Current suicidal ideation? Denies  Danger to Others  Danger to Others None reported or observed

## 2022-11-18 NOTE — Tx Team (Signed)
Initial Treatment Plan 11/18/2022 3:51 AM Stacey [name] Husch [id number]    PATIENT STRESSORS: Financial difficulties   Health problems   Medication change or noncompliance     PATIENT STRENGTHS: Ability for insight  Capable of independent living  General fund of knowledge  Motivation for treatment/growth    PATIENT IDENTIFIED PROBLEMS:   depression  Suicidal ideation   "Need referral for homeless shelter  "Back on her medication"             DISCHARGE CRITERIA:  Adequate post-discharge living arrangements Improved stabilization in mood, thinking, and/or behavior Medical problems require only outpatient monitoring Motivation to continue treatment in a less acute level of care Need for constant or close observation no longer present  PRELIMINARY DISCHARGE PLAN: Outpatient therapy Participate in family therapy Placement in alternative living arrangements Return to previous work or school arrangements  PATIENT/FAMILY INVOLVEMENT: This treatment plan has been presented to and reviewed with the patient, Stacey [name],  The patient and family have been given the opportunity to ask questions and make suggestions.  Earnest Conroy, RN 11/18/2022, 3:51 AM
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
## 2022-11-18 NOTE — [facility] Group Notes (Signed)
Adult Psychoeducational Group Note  Date:  11/18/2022 Time:  11:52 PM  Group Topic/Focus:  Wrap-Up Group:   The focus of this group is to help patients review their daily goal of treatment and discuss progress on daily workbooks.  Participation Level:  Active  Participation Quality:  Appropriate  Affect:  Appropriate  Cognitive:  Appropriate  Insight: Appropriate  Engagement in Group:  Engaged  Modes of Intervention:  Education  Additional Comments:  Pt goal today was to get her med's straightened out. Tomorrow pt wants to work on finding out what the Child psychotherapist can offer.  Aretta Stetzel, Sharen Counter 11/18/2022, 11:52 PM

## 2022-11-18 NOTE — Progress Notes (Signed)
Pt stated she takes something for allergies, but could not remember what it was, pt encouraged to talk to the doctor tomorrow, pt said she thought it might have been Zyrtec , but could not remember

## 2022-11-18 NOTE — Plan of Care (Signed)

## 2022-11-18 NOTE — H&P (Signed)
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 Folic Acid HbAIC HCG UDS UA Order: TSH, Free T3, CMP; UDS+ Morphine, THC, BAL<10  Psychotherapy:  group, milieu  Medications:   see MAR  Consultations:   social work  Discharge Concerns:   safety  Estimated LOS:  Other:     Physician Treatment Plan for Primary Diagnosis: Bipolar 1 disorder, depressed, severe Long Term Goal(s): Improvement in symptoms so as ready for discharge  Short Term Goals: Ability to identify changes in lifestyle to reduce recurrence of condition will improve, Ability to verbalize feelings will improve, Ability to disclose and discuss suicidal ideas, Ability to demonstrate self-control will improve,  Ability to identify and develop effective coping behaviors will improve, Ability to maintain clinical measurements within normal limits will improve, Compliance with prescribed medications will improve, and Ability to identify triggers associated with substance abuse/mental health issues will improve  Physician Treatment Plan for Secondary Diagnosis: Principal Problem:   Bipolar 1 disorder, depressed, severe  Long Term Goal(s): Improvement in symptoms so as ready for discharge  Short Term Goals: Ability to identify changes in lifestyle to reduce recurrence of condition will improve, Ability to verbalize feelings will improve, Ability to disclose and discuss suicidal ideas, Ability to demonstrate self-control will improve, Ability to identify and develop effective coping behaviors will improve, Ability to maintain clinical measurements within normal limits will  improve, Compliance with prescribed medications will improve, and Ability to identify triggers associated with substance abuse/mental health issues will improve  I certify that inpatient services furnished can reasonably be expected to improve the patient's condition.    Loletta Parish, NP 4/21/20249:43 AM

## 2022-11-19 ENCOUNTER — Encounter (HOSPITAL_COMMUNITY): Payer: Self-pay

## 2022-11-19 DIAGNOSIS — F314 Bipolar disorder, current episode depressed, severe, without psychotic features: Secondary | ICD-10-CM | POA: Diagnosis not present

## 2022-11-19 MED ORDER — SENNOSIDES-DOCUSATE SODIUM 8.6-50 MG PO TABS
1.0000 | ORAL_TABLET | Freq: Every day | ORAL | Status: DC
  Administered 2022-11-19 – 2022-11-21 (×3): 1 via ORAL
  Filled 2022-11-19 (×8): qty 1

## 2022-11-19 MED ORDER — GABAPENTIN 100 MG PO CAPS
200.0000 mg | ORAL_CAPSULE | Freq: Three times a day (TID) | ORAL | Status: DC
  Administered 2022-11-19 – 2022-11-22 (×11): 200 mg via ORAL
  Filled 2022-11-19 (×15): qty 2

## 2022-11-19 MED ORDER — HYDROCODONE-ACETAMINOPHEN 5-325 MG PO TABS
1.0000 | ORAL_TABLET | Freq: Four times a day (QID) | ORAL | Status: DC | PRN
  Administered 2022-11-19 – 2022-11-22 (×8): 1 via ORAL
  Filled 2022-11-19 (×8): qty 1

## 2022-11-19 MED ORDER — LORATADINE 10 MG PO TABS
10.0000 mg | ORAL_TABLET | Freq: Every day | ORAL | Status: DC
  Administered 2022-11-19 – 2022-11-23 (×5): 10 mg via ORAL
  Filled 2022-11-19 (×9): qty 1

## 2022-11-19 MED ORDER — SENNOSIDES-DOCUSATE SODIUM 8.6-50 MG PO TABS
1.0000 | ORAL_TABLET | Freq: Every day | ORAL | Status: DC
  Filled 2022-11-19: qty 1

## 2022-11-19 NOTE — Group Note (Signed)
Recreation Therapy Group Note   Group Topic:Team Building  Group Date: 11/19/2022 Start Time: 1308 End Time: 1016 Facilitators: Shawnia Vizcarrondo-McCall, LRT,CTRS Location: [address] Dayroom   Goal Area(s) Addresses:  Patient will effectively work with peer towards shared goal.  Patient will identify skills used to make activity successful.  Patient will identify how skills used during activity can be used to reach post d/c goals.    Group Description: Landing Pad. In teams of 3-5, patients were given 12 plastic drinking straws and an equal length of masking tape. Using the materials provided, patients were asked to build a landing pad to catch a golf ball dropped from approximately 5 feet in the air. All materials were required to be used by the team in their design. LRT facilitated post-activity discussion.   Affect/Mood: N/A   Participation Level: Did not attend    Clinical Observations/Individualized Feedback:    Plan: Continue to engage patient in RT group sessions 2-3x/week.   Aida Lemaire-McCall, LRT,CTRS 11/19/2022 1:16 PM

## 2022-11-19 NOTE — Plan of Care (Signed)
  Problem: Education: Goal: Knowledge of [facility] Education information/materials will improve Outcome: Progressing Goal: Emotional status will improve Outcome: Progressing Goal: Mental status will improve Outcome: Progressing Goal: Verbalization of understanding the information provided will improve Outcome: Progressing   Problem: Activity: Goal: Interest or engagement in activities will improve Outcome: Progressing Goal: Sleeping patterns will improve Outcome: Progressing   Problem: Coping: Goal: Ability to verbalize frustrations and anger appropriately will improve Outcome: Progressing Goal: Ability to demonstrate self-control will improve Outcome: Progressing   
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
## 2022-11-19 NOTE — Progress Notes (Signed)
Pt up requesting an EKG "I have Tachycardia, I think I need an EKG" pt vital signs were taken and they were 132/90 pulse 87 O2 99 RA , and pt did not appear to be in distress. Pt very pessimistic , pt complaining that "they did not give me my medications" pt educated on talking with the doctor. Pt walked away from the medication window after getting her vital signs taken, pt appeared to not want to hear what writer was trying to explain.
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
## 2022-11-19 NOTE — Progress Notes (Signed)
   11/19/22 0800  Psych Admission Type (Psych Patients Only)  Admission Status Voluntary  Psychosocial Assessment  Patient Complaints Anxiety  Eye Contact Fair  Facial Expression Anxious  Affect Anxious;Depressed  Speech Logical/coherent  Interaction Assertive  Motor Activity Slow  Appearance/Hygiene Unremarkable  Behavior Characteristics Appropriate to situation  Mood Depressed  Aggressive Behavior  Effect No apparent injury  Thought Process  Coherency WDL  Content WDL  Delusions WDL  Perception WDL  Hallucination None reported or observed  Judgment Poor  Confusion None  Danger to Self  Current suicidal ideation? Denies  Agreement Not to Harm Self Yes  Description of Agreement Patient agreed to safety plan  Danger to Others  Danger to Others None reported or observed

## 2022-11-19 NOTE — BHH Group Notes (Signed)
Pt did not attend AA group  
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
## 2022-11-20 DIAGNOSIS — F314 Bipolar disorder, current episode depressed, severe, without psychotic features: Secondary | ICD-10-CM | POA: Diagnosis not present

## 2022-11-20 NOTE — Progress Notes (Signed)
Patient appears anxious. Patient denies SI/HI/AVH. Pt reports anxiety is 7/10 and depression is 5/10. Pt reports "not great" sleep and good appetite. Pt reported pain 7.5/10 in her back. Pt requested voltaren for pain after seeing another pt with it. Pt complained of plantar fascitis pain in their left foot. Patient complied with morning medication with no reported side effects. Patient remains safe on Q28min checks and contracts for safety.      11/20/22 1020  Psych Admission Type (Psych Patients Only)  Admission Status Voluntary  Psychosocial Assessment  Patient Complaints Anxiety;Depression;Sleep disturbance  Eye Contact Fair  Facial Expression Anxious  Affect Anxious  Speech Logical/coherent  Interaction Assertive  Appearance/Hygiene Unremarkable  Behavior Characteristics Cooperative;Appropriate to situation  Mood Anxious  Thought Process  Coherency WDL  Delusions None reported or observed  Perception WDL  Hallucination None reported or observed  Judgment Poor  Confusion None  Danger to Self  Current suicidal ideation? Denies  Agreement Not to Harm Self Yes  Description of Agreement verbal  Danger to Others  Danger to Others None reported or observed

## 2022-11-20 NOTE — [facility] Group Notes (Signed)
Adult Psychoeducational Group Note  Date:  11/20/2022 Time:  9:58 PM  Group Topic/Focus:  Wrap-Up Group:   The focus of this group is to help patients review their daily goal of treatment and discuss progress on daily workbooks.  Participation Level:  Did Not Attend  Participation Quality:   Did not attend  Affect:   Did not attend  Cognitive:   Did not attend  Insight: None  Engagement in Group:   Did not attend  Modes of Intervention:   Did not attend  Additional Comments:  Did not attend  Stacey [name] 11/20/2022, 9:58 PM

## 2022-11-20 NOTE — Group Note (Signed)
LCSW Group Therapy Note   Group Date: 11/20/2022 Start Time: 1100 End Time: 1200   Type of Therapy and Topic:  Group Therapy: Goal Exploration   Participation Level:  Active    Description of Group: In this process group, patients discussed using strengths to work toward goals and address challenges.  Patients identified two positive things about themselves and one goal they were working on.  Patients were given the opportunity to share openly and support each other's plan for self-empowerment.  The group discussed the value of gratitude and were encouraged to have a daily reflection of positive characteristics or circumstances.  Patients were encouraged to identify a plan to utilize their strengths to work on current challenges and goals.   Therapeutic Goals Patient will verbalize personal strengths/positive qualities and relate how these can assist with achieving desired personal goals Patients will verbalize affirmation of peers plans for personal change and goal setting Patients will explore the value of gratitude and positive focus as related to successful achievement of goals Patients will verbalize a plan for regular reinforcement of personal positive qualities and circumstances.   Summary of Patient Progress: Patient identified the definition of goals. Patient was able to engage and share her insights on goal exploration. Patient did share her own personal goals once she is discharge. Patient was respectful of her peers and remain in group the entire time.      Therapeutic Modalities Cognitive Behavioral Therapy Motivational Interviewing    Stacey [name] 11/20/2022  3:29 PM
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
## 2022-11-20 NOTE — Group Note (Signed)
Recreation Therapy Group Note   Group Topic:Animal Assisted Therapy   Group Date: 11/20/2022 Start Time: 1610 End Time: 1030 Facilitators: Rose-Marie Hickling-McCall, LRT,CTRS Location: [address] Dayroom   Animal-Assisted Activity (AAA) Program Checklist/Progress Notes Patient Eligibility Criteria Checklist & Daily Group note for Rec Tx Intervention  AAA/T Program Assumption of Risk Form signed by Patient/ or Parent Legal Guardian Yes  Patient is free of allergies or severe asthma Yes  Patient reports no fear of animals Yes  Patient reports no history of cruelty to animals Yes  Patient understands his/her participation is voluntary Yes  Patient washes hands before animal contact Yes  Patient washes hands after animal contact Yes   Affect/Mood: Happy   Participation Level: Engaged   Participation Quality: Independent   Behavior: Appropriate   Speech/Thought Process: Focused    Clinical Observations/Individualized Feedback: Pt attended and participated in group session.     Plan: Continue to engage patient in RT group sessions 2-3x/week.   Allesha Aronoff-McCall, LRT,CTRS 11/20/2022 1:35 PM

## 2022-11-20 NOTE — Progress Notes (Signed)
[name], Stacey A, Chaplain  Stacey [name] B, Chaplain Spiritual care group on Spiritual Resources highlighting hope today facilitated by Chaplain Katy [name], Bcc and Chaplain Jervis Trapani  Group Goal: Support / Education around spiritual resources - Hope  Members engage in facilitated group support and psycho-social education.  Group Description:  Following introductions and group rules, group members engaged in facilitated group dialogue and support around topic the topic of hope. There is particular support around what brings them hope and what makes them feel hopeless. Group identified types of hope and identified patterns, circumstances, and changes that precipitate being hopeless. Group reflected on thoughts / feelings around hope, normalized hopeless responses, and recognized a variety in hope experiences. Group encouraged individual reflection on what brings them hope and drew inspiration from pictures that represent hope to each of them.   Patient Progress: Patient did not attend group.   [name] Chaplain 
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
## 2022-11-20 NOTE — Plan of Care (Signed)
  Problem: Education: Goal: Knowledge of [place] [facility] Education information/materials will improve Outcome: Progressing Goal: Emotional status will improve Outcome: Progressing Goal: Mental status will improve Outcome: Progressing Goal: Verbalization of understanding the information provided will improve Outcome: Progressing   Problem: Activity: Goal: Interest or engagement in activities will improve Outcome: Progressing Goal: Sleeping patterns will improve Outcome: Progressing   Problem: Coping: Goal: Ability to verbalize frustrations and anger appropriately will improve Outcome: Progressing Goal: Ability to demonstrate self-control will improve Outcome: Progressing   Problem: Health Behavior/Discharge Planning: Goal: Identification of resources available to assist in meeting health care needs will improve Outcome: Progressing Goal: Compliance with treatment plan for underlying cause of condition will improve Outcome: Progressing   Problem: Physical Regulation: Goal: Ability to maintain clinical measurements within normal limits will improve Outcome: Progressing   Problem: Safety: Goal: Periods of time without injury will increase Outcome: Progressing   Problem: Education: Goal: Utilization of techniques to improve thought processes will improve Outcome: Progressing Goal: Knowledge of the prescribed therapeutic regimen will improve Outcome: Progressing   Problem: Activity: Goal: Interest or engagement in leisure activities will improve Outcome: Progressing Goal: Imbalance in normal sleep/wake cycle will improve Outcome: Progressing   Problem: Coping: Goal: Coping ability will improve Outcome: Progressing Goal: Will verbalize feelings Outcome: Progressing   Problem: Health Behavior/Discharge Planning: Goal: Ability to make decisions will improve Outcome: Progressing Goal: Compliance with therapeutic regimen will improve Outcome: Progressing    Problem: Role Relationship: Goal: Will demonstrate positive changes in social behaviors and relationships Outcome: Progressing   Problem: Safety: Goal: Ability to disclose and discuss suicidal ideas will improve Outcome: Progressing Goal: Ability to identify and utilize support systems that promote safety will improve Outcome: Progressing   Problem: Self-Concept: Goal: Will verbalize positive feelings about self Outcome: Progressing Goal: Level of anxiety will decrease Outcome: Progressing   Problem: Education: Goal: Ability to make informed decisions regarding treatment will improve Outcome: Progressing   Problem: Coping: Goal: Coping ability will improve Outcome: Progressing   Problem: Health Behavior/Discharge Planning: Goal: Identification of resources available to assist in meeting health care needs will improve Outcome: Progressing   Problem: Medication: Goal: Compliance with prescribed medication regimen will improve Outcome: Progressing   Problem: Self-Concept: Goal: Ability to disclose and discuss suicidal ideas will improve Outcome: Progressing Goal: Will verbalize positive feelings about self Outcome: Progressing   

## 2022-11-21 DIAGNOSIS — F314 Bipolar disorder, current episode depressed, severe, without psychotic features: Secondary | ICD-10-CM | POA: Diagnosis not present
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
## 2022-11-21 NOTE — Plan of Care (Signed)
  Problem: Health Behavior/Discharge Planning: Goal: Compliance with treatment plan for underlying cause of condition will improve Outcome: Progressing   Problem: Safety: Goal: Periods of time without injury will increase Outcome: Progressing   

## 2022-11-21 NOTE — Progress Notes (Signed)
   11/21/22 0600  15 Minute Checks  Location Bedroom  Visual Appearance Calm  Behavior Sleeping  Sleep (Behavioral Health Patients Only)  Calculate sleep? (Click Yes once per 24 hr at 0600 safety check) Yes  Documented sleep last 24 hours 6

## 2022-11-21 NOTE — Group Note (Signed)
Date:  11/21/2022 Time:  9:44 AM  Group Topic/Focus:  Goals Group:   The focus of this group is to help patients establish daily goals to achieve during treatment and discuss how the patient can incorporate goal setting into their daily lives to aide in recovery. Orientation:   The focus of this group is to educate the patient on the purpose and policies of crisis stabilization and provide a format to answer questions about their admission.  The group details unit policies and expectations of patients while admitted.    Participation Level:  Active  Participation Quality:  Attentive  Affect:  Appropriate  Cognitive:  Appropriate  Insight: Appropriate  Engagement in Group:  Engaged  Modes of Intervention:  Discussion  Additional Comments:  Patient attended group and was attentive the duration of it. Patient's goal was to work on coping skills for her depression.   Earlyn Sylvan T Lorraine Lax 11/21/2022, 9:44 AM

## 2022-11-21 NOTE — [facility] Group Notes (Signed)
Spiritual care group on grief and loss facilitated by Chaplain Dyanne Carrel, [facility]  Group Goal: Support / Education around grief and loss  Members engage in facilitated group support and psycho-social education.  Group Description:  Following introductions and group rules, group members engaged in facilitated group dialogue and support around topic of loss, with particular support around experiences of loss in their lives. Group Identified types of loss (relationships / self / things) and identified patterns, circumstances, and changes that precipitate losses. Reflected on thoughts / feelings around loss, normalized grief responses, and recognized variety in grief experience. Group encouraged individual reflection on safe space and on the coping skills that they are already utilizing.  Group drew on Adlerian / Rogerian and narrative framework  Patient Progress: Stacey [name] attended group and actively engaged and participated in group activities and conversation.  She shared about her experiences of grief and her comments demonstrated good insight.

## 2022-11-21 NOTE — Group Note (Signed)
Recreation Therapy Group Note   Group Topic:Health and Wellness  Group Date: 11/21/2022 Start Time: 0930 End Time: 0955 Facilitators: Kataleia Quaranta-McCall, LRT,CTRS Location: [address] Dayroom   Goal Area(s) Addresses:  Patient will define components of whole wellness. Patient will verbalize benefit of whole wellness.   Group Description:  Exercise.  LRT and patients discussed the importance on physical exercise and its affects on the body.  LRT then explained to patients they would take turns leading the group in exercises of their choosing.  Patients were told to consider the physical abilities of peers and to anything that would be too challenging.  Patients could take breaks and get water as needed.     Affect/Mood: Appropriate   Participation Level: Engaged   Participation Quality: Independent   Behavior: Appropriate   Speech/Thought Process: Focused   Insight: Good   Judgement: Good   Modes of Intervention: Music   Patient Response to Interventions:  Engaged   Education Outcome:  In group clarification offered    Clinical Observations/Individualized Feedback: Pt attended, engaged and was social with peers.   Plan: Continue to engage patient in RT group sessions 2-3x/week.   Vence Lalor-McCall, LRT,CTRS 11/21/2022 1:42 PM

## 2022-11-22 DIAGNOSIS — F314 Bipolar disorder, current episode depressed, severe, without psychotic features: Secondary | ICD-10-CM | POA: Diagnosis not present

## 2022-11-22 MED ORDER — GABAPENTIN 300 MG PO CAPS
300.0000 mg | ORAL_CAPSULE | Freq: Three times a day (TID) | ORAL | Status: DC
  Administered 2022-11-23: 300 mg via ORAL
  Filled 2022-11-22 (×8): qty 1

## 2022-11-22 MED ORDER — TRAZODONE HCL 150 MG PO TABS
150.0000 mg | ORAL_TABLET | Freq: Every evening | ORAL | Status: DC | PRN
  Administered 2022-11-22: 150 mg via ORAL
  Filled 2022-11-22: qty 1

## 2022-11-22 MED ORDER — TIZANIDINE HCL 2 MG PO TABS
4.0000 mg | ORAL_TABLET | Freq: Every evening | ORAL | Status: DC | PRN
  Administered 2022-11-22: 4 mg via ORAL
  Filled 2022-11-22: qty 2

## 2022-11-22 MED ORDER — HYDROXYZINE HCL 25 MG PO TABS
25.0000 mg | ORAL_TABLET | ORAL | Status: DC | PRN
  Administered 2022-11-22: 25 mg via ORAL
  Filled 2022-11-22: qty 1
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
## 2022-11-22 NOTE — Progress Notes (Signed)
   11/22/22 0705  15 Minute Checks  Location Cafeteria  Visual Appearance Calm  Behavior Composed  Sleep (Behavioral Health Patients Only)  Calculate sleep? (Click Yes once per 24 hr at 0600 safety check) Yes  Documented sleep last 24 hours 7.5

## 2022-11-22 NOTE — [facility] Group Notes (Signed)
Psychoeducational Group Note  Date:  11/22/2022 Time:  2000  Group Topic/Focus:  Wrap up group  Participation Level: Did Not Attend  Participation Quality:  Not Applicable  Affect:  Not Applicable  Cognitive:  Not Applicable  Insight:  Not Applicable  Engagement in Group: Not Applicable  Additional Comments:  Did not attend.   Marcille Buffy 11/22/2022, 10:31 PM

## 2022-11-22 NOTE — Progress Notes (Signed)
   11/22/22 2300  Psych Admission Type (Psych Patients Only)  Admission Status Voluntary  Psychosocial Assessment  Patient Complaints Depression  Eye Contact Fair  Facial Expression Anxious  Affect Depressed  Speech Logical/coherent  Interaction Assertive  Motor Activity Slow;Shuffling  Appearance/Hygiene Unremarkable  Behavior Characteristics Cooperative;Appropriate to situation  Mood Depressed;Anxious;Pleasant  Thought Process  Coherency WDL  Content WDL  Delusions None reported or observed  Perception WDL  Hallucination None reported or observed  Judgment Limited  Confusion None  Danger to Self  Current suicidal ideation? Denies  Agreement Not to Harm Self Yes  Description of Agreement verbal  Danger to Others  Danger to Others None reported or observed

## 2022-11-22 NOTE — Progress Notes (Signed)
   11/22/22 1209  Psych Admission Type (Psych Patients Only)  Admission Status Voluntary  Psychosocial Assessment  Patient Complaints Anxiety;Depression  Eye Contact Fair  Facial Expression Anxious  Affect Depressed  Speech Logical/coherent  Interaction Assertive  Motor Activity Slow  Appearance/Hygiene Unremarkable  Behavior Characteristics Cooperative;Appropriate to situation  Mood Anxious;Depressed;Pleasant  Thought Process  Coherency WDL  Content WDL  Delusions None reported or observed  Perception WDL  Hallucination None reported or observed  Judgment Limited  Confusion None  Danger to Self  Current suicidal ideation? Denies  Agreement Not to Harm Self Yes  Description of Agreement verbally  Danger to Others  Danger to Others None reported or observed

## 2022-11-22 NOTE — Progress Notes (Signed)
   11/21/22 2300  Psych Admission Type (Psych Patients Only)  Admission Status Voluntary  Psychosocial Assessment  Patient Complaints Anxiety;Depression  Eye Contact Fair  Facial Expression Anxious  Affect Depressed  Speech Logical/coherent  Interaction Assertive  Motor Activity Slow  Appearance/Hygiene Unremarkable  Behavior Characteristics Cooperative;Appropriate to situation  Mood Anxious;Depressed;Pleasant  Thought Process  Coherency WDL  Content WDL  Delusions None reported or observed  Perception WDL  Hallucination None reported or observed  Judgment Limited  Confusion None  Danger to Self  Current suicidal ideation? Denies  Agreement Not to Harm Self Yes  Description of Agreement verbally  Danger to Others  Danger to Others None reported or observed

## 2022-11-22 NOTE — Group Note (Signed)
LCSW Group Therapy Note  Group Date: 11/22/2022 Start Time: 1100 End Time: 1200   Type of Therapy and Topic:  Group Therapy - How To Cope with Nervousness about Discharge   Participation Level:  Active   Description of Group This process group involved identification of patients' feelings about discharge. Some of them are scheduled to be discharged soon, while others are new admissions, but each of them was asked to share thoughts and feelings surrounding discharge from the hospital. One common theme was that they are excited at the prospect of going home, while another was that many of them are apprehensive about sharing why they were hospitalized. Patients were given the opportunity to discuss these feelings with their peers in preparation for discharge.  Therapeutic Goals  Patient will identify their overall feelings about pending discharge. Patient will think about how they might proactively address issues that they believe will once again arise once they get home (i.e. with parents). Patients will participate in discussion about having hope for change.   Summary of Patient Progress:  Patient was very active throughout the session. Patient demonstrated positive insight into the subject matter, and proved open to input from peers and feedback from CSW. Patient assisted one of her peers who is unable to see to write. Patient shared a safe zone she enjoys going to that helps her calms her moods. Patient  was respectful of peers and participated throughout the entire session.   Therapeutic Modalities Cognitive Behavioral Therapy   Beather Arbour 11/22/2022  1:03 PM

## 2022-11-23 DIAGNOSIS — F314 Bipolar disorder, current episode depressed, severe, without psychotic features: Secondary | ICD-10-CM | POA: Diagnosis not present

## 2022-11-23 MED ORDER — GABAPENTIN 300 MG PO CAPS: 300.0000 mg | ORAL_CAPSULE | Freq: Three times a day (TID) | ORAL | Status: DC

## 2022-11-23 MED ORDER — OMEPRAZOLE 40 MG PO CPDR: 40.0000 mg | DELAYED_RELEASE_CAPSULE | Freq: Every day | ORAL | 0 refills | Status: DC

## 2022-11-23 MED ORDER — NICOTINE POLACRILEX 2 MG MT GUM: 2.0000 mg | CHEWING_GUM | OROMUCOSAL | 0 refills | Status: DC | PRN

## 2022-11-23 NOTE — [facility] Group Notes (Signed)
Adult Psychoeducational Group Note  Date:  11/23/2022 Time:  9:54 AM  Group Topic/Focus:  Goals Group:   The focus of this group is to help patients establish daily goals to achieve during treatment and discuss how the patient can incorporate goal setting into their daily lives to aide in recovery.  Participation Level:  Active  Participation Quality:  Appropriate  Affect:  Appropriate  Cognitive:  Appropriate  Insight: Appropriate  Engagement in Group:  Engaged  Modes of Intervention:  Education  Additional Comments: Goal: To leave and be more loving.  Gwinda Maine 11/23/2022, 9:54 AM
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
## 2022-11-23 NOTE — Progress Notes (Signed)
   11/23/22 0815  Psych Admission Type (Psych Patients Only)  Admission Status Voluntary  Psychosocial Assessment  Patient Complaints Worrying  Eye Contact Fair  Facial Expression Animated  Affect Preoccupied  Speech Logical/coherent  Interaction Assertive  Motor Activity Slow  Appearance/Hygiene Unremarkable  Behavior Characteristics Cooperative;Appropriate to situation  Mood Pleasant  Thought Process  Coherency WDL  Content WDL  Delusions None reported or observed  Perception WDL  Hallucination None reported or observed  Judgment Limited  Confusion None  Danger to Self  Current suicidal ideation? Denies  Agreement Not to Harm Self Yes  Description of Agreement verbal  Danger to Others  Danger to Others None reported or observed

## 2022-11-23 NOTE — Discharge Instructions (Addendum)
-  Follow-up with your outpatient psychiatric provider -instructions on appointment date, time, and address (location) are provided to you in discharge paperwork.  -Take your psychiatric medications as prescribed at discharge - instructions are provided to you in the discharge paperwork As you indicated, you do not need any prescriptions at discharge and you have enough medications at home to last until your next follow-up appointments.   -Follow-up with outpatient primary care doctor and other specialists -for management of preventative medicine and any chronic medical disease.  -Recommend abstinence from alcohol, tobacco, and other illicit drug use at discharge.   -If your psychiatric symptoms recur, worsen, or if you have side effects to your psychiatric medications, call your outpatient psychiatric provider, 911, 988 or go to the nearest emergency department.  -If suicidal thoughts occur, call your outpatient psychiatric provider, 911, 988 or go to the nearest emergency department.  Naloxone (Narcan) can help reverse an overdose when given to the victim quickly.  [facility] offers free naloxone kits and instructions/training on its use.  Add naloxone to your first aid kit and you can help save a life.   Pick up your free kit at the following locations:   [place]:  [facility], [address] [place] [address] [phone]) [facility] [address] [place] [address] [phone]) [facility] [address] [place] [address]  [place]: [facility] [address] [place] [address] ([phone]) [facility] [address] [place] [address] [phone])

## 2022-11-23 NOTE — Progress Notes (Signed)
   11/23/22 0558  15 Minute Checks  Location Bedroom  Visual Appearance Calm  Behavior Composed  Sleep (Behavioral Health Patients Only)  Calculate sleep? (Click Yes once per 24 hr at 0600 safety check) Yes  Documented sleep last 24 hours 6.75

## 2022-11-23 NOTE — Progress Notes (Signed)
Discharge Note:  Patient denies SI/HI/AVH at this time. Discharge instructions, AVS, prescriptions, and transition record gone over with patient. Patient agrees to comply with medication management, follow-up visit, and outpatient therapy. Patient belongings returned to patient. Patient questions and concerns addressed and answered. Patient ambulatory, though unsteady off unit. Patient discharged to home with friend to orthopedic appointment.

## 2022-11-23 NOTE — BHH Counselor (Signed)
CSW spoke with patient to check and see who her legal guardian was and patient declined stating , " I never went to court, signed papers, or had any one appointed to be my guardian" . Also states that she does not have any documents nor provided them to anyone in the hospital. Furthermore, patient provided me with her friend number to do safety planning and stated that he will be picking her up at 11:00AM. CSW will continue to assist.

## 2022-11-23 NOTE — BHH Suicide Risk Assessment (Signed)
[facility] INPATIENT:  Family/Significant Other Suicide Prevention Education  Suicide Prevention Education:  Education Completed; Laurell Roof [phone],  (name of family member/significant other) has been identified by the patient as the family member/significant other with whom the patient will be residing, and identified as the person(s) who will aid the patient in the event of a mental health crisis (suicidal ideations/suicide attempt).  With written consent from the patient, the family member/significant other has been provided the following suicide prevention education, prior to the and/or following the discharge of the patient.  CSW spoke with patient friend and completed safety planning. Friend stated that patient will be coming to live with him and confirmed that he had no gun or weapons in his home. Also states that he has no safety concerns for patient.   The suicide prevention education provided includes the following: Suicide risk factors Suicide prevention and interventions National Suicide Hotline telephone number [facility] assessment telephone number [facility] Emergency Assistance 911 [facility] and/or Residential Mobile Crisis Unit telephone number  Request made of family/significant other to: Remove weapons (e.g., guns, rifles, knives), all items previously/currently identified as safety concern.   Remove drugs/medications (over-the-counter, prescriptions, illicit drugs), all items previously/currently identified as a safety concern.  The family member/significant other verbalizes understanding of the suicide prevention education information provided.  The family member/significant other agrees to remove the items of safety concern listed above.  Stacey [name] 11/23/2022, 10:19 AM

## 2022-11-23 NOTE — Progress Notes (Signed)
  [facility] Adult Case Management Discharge Plan :  Will you be returning to the same living situation after discharge:  No.Patient will be going to stay with a friend  At discharge, do you have transportation home?: Yes,  Pt friend will be picking her up at 11AM Do you have the ability to pay for your medications: Yes,  [employer] Medicaid   Release of information consent forms completed and in the chart;  Patient's signature needed at discharge.  Patient to Follow up at:  Follow-up Information     [facility] Go on 12/14/2022.   Why: You have a virtual medication management appointment with this provider on Friday 12/14/2022 @ 3:00PM. However, this provider will send you an intake packet that you will need to complete before your appointment . It will be sent to your phone via text and email. If you have any questions , please reach out to this provider. Contact information: [address] [place] [address] [phone]         ?[facility] Call on 11/23/2022.   Why: Please follow up with your provider to schedule your therpay services. I was unable contact her . If you communicate with her via email , please contact her once you are DC at [email or web address] .com Contact information: [address], [address] [place], [place] [address] Phone: ?[phone] Fax: [phone]                Next level of care provider has access to [facility] Link:no  Safety Planning and Suicide Prevention discussed: Yes,  Laurell Roof [phone]     Has patient been referred to the Quitline?: Patient refused referral  Patient has been referred for addiction treatment: N/A. Pt does not have an addiction to need an referral   [name], LCSWA 11/23/2022, 10:39 AM

## 2023-03-16 ENCOUNTER — Emergency Department (HOSPITAL_COMMUNITY)
Admission: EM | Admit: 2023-03-16 | Discharge: 2023-03-19 | Disposition: A | Discharge status: Other Acute Inpatient Hospital | Payer: MEDICAID | Attending: Emergency Medicine | Admitting: Emergency Medicine

## 2023-03-16 ENCOUNTER — Other Ambulatory Visit: Payer: Self-pay

## 2023-03-16 ENCOUNTER — Encounter (HOSPITAL_COMMUNITY): Payer: Self-pay | Admitting: Emergency Medicine

## 2023-03-16 ENCOUNTER — Encounter: Payer: Self-pay | Admitting: Hematology & Oncology

## 2023-03-16 DIAGNOSIS — G4700 Insomnia, unspecified: Secondary | ICD-10-CM | POA: Diagnosis not present

## 2023-03-16 DIAGNOSIS — Z91040 Latex allergy status: Secondary | ICD-10-CM | POA: Insufficient documentation

## 2023-03-16 DIAGNOSIS — F315 Bipolar disorder, current episode depressed, severe, with psychotic features: Secondary | ICD-10-CM | POA: Diagnosis present

## 2023-03-16 DIAGNOSIS — F314 Bipolar disorder, current episode depressed, severe, without psychotic features: Secondary | ICD-10-CM | POA: Diagnosis not present

## 2023-03-16 NOTE — ED Triage Notes (Signed)
Patient present due to insomnia. She has not been compliant with her psych meds and has been taking a steroid. The patient has not been able to sleep in 4 days. EMS noted intermittent drowsiness, erratic behavior and excessive talking. Each time she begins to nod to sleep, she wakes her up, started, believing she had a stroke.     EMS vitals: 156/101 BP 188 CBG 99% SPO2 on room air 102 HR

## 2023-03-17 DIAGNOSIS — F314 Bipolar disorder, current episode depressed, severe, without psychotic features: Secondary | ICD-10-CM

## 2023-03-17 LAB — COMPREHENSIVE METABOLIC PANEL
ALT: 19 U/L (ref 0–44)
AST: 18 U/L (ref 15–41)
Albumin: 3.8 g/dL (ref 3.5–5.0)
Alkaline Phosphatase: 88 U/L (ref 38–126)
Anion gap: 8 (ref 5–15)
BUN: 17 mg/dL (ref 6–20)
CO2: 26 mmol/L (ref 22–32)
Calcium: 8.6 mg/dL — ABNORMAL LOW (ref 8.9–10.3)
Chloride: 103 mmol/L (ref 98–111)
Creatinine, Ser: 0.63 mg/dL (ref 0.44–1.00)
GFR, Estimated: >60 mL/min (ref >60)
Glucose, Bld: 95 mg/dL (ref 70–99)
Potassium: 3.5 mmol/L (ref 3.5–5.1)
Sodium: 137 mmol/L (ref 135–145)
Total Bilirubin: 0.7 mg/dL (ref 0.3–1.2)
Total Protein: 7.8 g/dL (ref 6.5–8.1)

## 2023-03-17 LAB — CBC
HCT: 41.4 % (ref 36.0–46.0)
Hemoglobin: 13.2 g/dL (ref 12.0–15.0)
MCH: 28.4 pg (ref 26.0–34.0)
MCHC: 31.9 g/dL (ref 30.0–36.0)
MCV: 89 fL (ref 80.0–100.0)
Platelets: 285 10*3/uL (ref 150–400)
RBC: 4.65 MIL/uL (ref 3.87–5.11)
RDW: 13 % (ref 11.5–15.5)
WBC: 10 10*3/uL (ref 4.0–10.5)
nRBC: 0 % (ref 0.0–0.2)

## 2023-03-17 LAB — RAPID URINE DRUG SCREEN, HOSP PERFORMED
Amphetamines: NOT DETECTED
Barbiturates: NOT DETECTED
Benzodiazepines: NOT DETECTED
Cocaine: NOT DETECTED
Opiates: NOT DETECTED
Tetrahydrocannabinol: POSITIVE — AB

## 2023-03-17 LAB — MAGNESIUM: Magnesium: 2.3 mg/dL (ref 1.7–2.4)

## 2023-03-17 LAB — ACETAMINOPHEN LEVEL: Acetaminophen (Tylenol), Serum: <10 ug/mL — ABNORMAL LOW (ref 10–30)

## 2023-03-17 LAB — SALICYLATE LEVEL: Salicylate Lvl: <7 mg/dL — ABNORMAL LOW (ref 7.0–30.0)

## 2023-03-17 LAB — ETHANOL: Alcohol, Ethyl (B): <10 mg/dL (ref <10)

## 2023-03-17 LAB — CK: Total CK: 38 U/L (ref 38–234)

## 2023-03-17 MED ORDER — TRAZODONE HCL 50 MG PO TABS
150.0000 mg | ORAL_TABLET | Freq: Every day | ORAL | Status: DC
  Administered 2023-03-17 – 2023-03-18 (×2): 150 mg via ORAL
  Filled 2023-03-17 (×2): qty 1

## 2023-03-17 MED ORDER — LORATADINE 10 MG PO TABS
10.0000 mg | ORAL_TABLET | Freq: Once | ORAL | Status: AC
  Administered 2023-03-17: 10 mg via ORAL
  Filled 2023-03-17: qty 1

## 2023-03-17 MED ORDER — DIPHENHYDRAMINE HCL 50 MG/ML IJ SOLN: 50.0000 mg | Freq: Once | INTRAMUSCULAR | Status: DC

## 2023-03-17 MED ORDER — LORAZEPAM 1 MG PO TABS
1.0000 mg | ORAL_TABLET | Freq: Once | ORAL | Status: DC
  Filled 2023-03-17: qty 1

## 2023-03-17 MED ORDER — ZIPRASIDONE MESYLATE 20 MG IM SOLR: 20.0000 mg | Freq: Once | INTRAMUSCULAR | Status: DC

## 2023-03-17 MED ORDER — LORAZEPAM 2 MG/ML IJ SOLN: 2.0000 mg | Freq: Once | INTRAMUSCULAR | Status: DC

## 2023-03-17 MED ORDER — LAMOTRIGINE 100 MG PO TABS
200.0000 mg | ORAL_TABLET | Freq: Two times a day (BID) | ORAL | Status: DC
  Administered 2023-03-17 – 2023-03-19 (×5): 200 mg via ORAL
  Filled 2023-03-17 (×5): qty 2

## 2023-03-17 MED ORDER — DULOXETINE HCL 20 MG PO CPEP
40.0000 mg | ORAL_CAPSULE | Freq: Every day | ORAL | Status: DC
  Administered 2023-03-17 – 2023-03-19 (×3): 40 mg via ORAL
  Filled 2023-03-17 (×3): qty 2

## 2023-03-17 MED ORDER — RISPERIDONE 1 MG PO TABS
1.0000 mg | ORAL_TABLET | Freq: Every day | ORAL | Status: DC
  Administered 2023-03-17 – 2023-03-18 (×2): 1 mg via ORAL
  Filled 2023-03-17 (×2): qty 1

## 2023-03-17 MED ORDER — GABAPENTIN 300 MG PO CAPS
300.0000 mg | ORAL_CAPSULE | Freq: Three times a day (TID) | ORAL | Status: DC
  Administered 2023-03-17 – 2023-03-19 (×7): 300 mg via ORAL
  Filled 2023-03-17 (×7): qty 1

## 2023-03-17 NOTE — ED Notes (Signed)
Lunch tray given. 

## 2023-03-17 NOTE — ED Notes (Signed)
Patient moved from main ED to [facility] she remains calm and cooperative no visual distress at this time, prescribed meds held, RN will continue to monitor

## 2023-03-17 NOTE — ED Notes (Signed)
 Pt has received their dinner tray.
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
## 2023-03-17 NOTE — ED Notes (Addendum)
Pt became emotional when told her jewelry had to come off. Nostril jewelry and bracelet removed from pt unable to remove septum jewelry. Jewelry in bag was placed in locker 34.

## 2023-03-17 NOTE — Progress Notes (Signed)
Patient has been denied by [facility] due to no appropriate beds available. Patient meets BH inpatient criteria per Dahlia Byes, NP. Patient has been faxed out to the following facilities:   [facility]  [address] [place] [place] [address] [phone]  [facility]  [address] [place], [place] [address] [phone]  [facility] Center-Adult  [address] [place] [address] [phone]  [facility]  [address] [place] [address] [phone]  [facility]  [address] [place] [address] [phone]  [facility] [address]  [address] [place] [address] [phone]  [facility]  [address], [place] [address] [phone]  [facility]  [address] [place] [address] [phone]  [facility]  [address] [place] [address] [phone]  [facility] Patient Placement  [facility], [place] [phone]  [facility]  [address] [place], [place] [address] [phone]  [facility]  [address] [place], [place] [address] [phone]  [facility]  [address] [place] [address] [phone]  [facility]  [address] [place] [address] [phone]   Damita Dunnings, MSW, LCSW-A  2:22 PM 03/17/2023
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
## 2023-03-17 NOTE — ED Notes (Signed)
Septum nose ring can not be removed

## 2023-03-17 NOTE — ED Notes (Signed)
Pt currently doing TTS assessment

## 2023-03-17 NOTE — ED Notes (Signed)
Sausage biscuit given to pt.

## 2023-03-17 NOTE — Consult Note (Signed)
[facility] ED ASSESSMENT   Reason for Consult:  Psychiatry evaluation Referring Physician:  ER Physician Patient Identification: Stacey [name] MRN:  [id number] ED Chief Complaint: <principal problem not specified>  Diagnosis:  Active Problems:   Bipolar disorder, curr episode depressed, severe, w/psychotic features [facility])   ED Assessment Time Calculation: Start Time: 1135 Stop Time: 1206 Total Time in Minutes (Assessment Completion): 31   Subjective:   Stacey [name] is a 57 y.o. female patient admitted with previous hx of bipolar I disorder, suicide attempt, suicide ideations; PTSD, multiple psychiatric hospitalizations, ECT treatment  was brought in by EMS stating that her boyfriend and family are trying to kill her and added she has a recording of this.  HPI:  Patient was seen for this morning evaluation.  She engaged in meaningful conversation.  Apparently she has not been taking her Medications.  She recently completed Steroid  for back pain.  Patient is confused, disorganized and speech is tangential. She remains paranoid perseverating on boyfriend and family planning to kill her. Patient is disheveled and unkempt, stating she has not been sleeping or eating.  She described mood as depressed and rated Depression 8/10 with 10 being severe depression.  She reports feeling tired, lack of concentration and some irritability which could be attributed to the Steroid as well. Patient was at [facility] in April and reports that she has been taking her Medications.  She admits she smokes Cannabis daily and smokes two hits a day.  She also has attempted suicide in the past but reports none since 2022.  Patient is calm, cooperative and answered questions promptly but had difficulty explaining herself. We have resumed her home Medications and will seek inpatient Psychiatry hospitalization.  We will fax records to facilities for available beds.  Past Psychiatric History:  previous hx of bipolar  I disorder, suicide attempt, suicide ideations; PTSD, multiple psychiatric hospitalizations, ECT treatment     Receives outpatient Psychiatry care at [facility] in [place].  She also receives therapy with Meredith Leeds.  Risk to Self or Others: Is the patient at risk to self? No Has the patient been a risk to self in the past 6 months? No Has the patient been a risk to self within the distant past? No Is the patient a risk to others? No Has the patient been a risk to others in the past 6 months? No Has the patient been a risk to others within the distant past? No  [place] Scale:  Flowsheet Row ED from 03/16/2023 in [facility] Emergency Department at [facility] Admission (Discharged) [facility]16/2022 in [facility]  C-SSRS RISK CATEGORY No Risk No Risk High Risk       AIMS:  , , ,  ,   ASAM:    Substance Abuse:  Alcohol / Drug Use Pain Medications: See MAR Prescriptions: See MAR Over the Counter: See MAR History of alcohol / drug use?: Yes Longest period of sobriety (when/how long): Unknown Negative Consequences of Use:  (N/A) Withdrawal Symptoms: None  Past Medical History:  Past Medical History:  Diagnosis Date   Allergic rhinitis, cause unspecified    Anxiety    Anxiety    Asthma    Beta thalassemia minor Pt has a family hx. Pt has never been tested.    Bipolar 1 disorder (HCC)    Complication of anesthesia 2004   bp dropped after c section,  took 24hrs to feels leg , numbing medicine does not last long when injected into skin   Diabetes mellitus without complication (HCC)    pre diabetic   Difficult intravenous access    hard stick for ivs and lab per pt   Family history of adverse reaction to anesthesia    mom heart stopped times 2 in or   Fibromyalgia    GERD (gastroesophageal reflux disease)    Headache    Iron deficiency anemia, unspecified 02/24/2013    Mental disorder    Morbid obesity (HCC)    Nasal bone fracture age 18   left side cannot place tube in nostril per pt   OSA (obstructive sleep apnea)    uses cpap does not know settings   Pernicious anemia 02/24/2013   Thyroid nodule     Past Surgical History:  Procedure Laterality Date   CESAREAN SECTION     COLONOSCOPY WITH PROPOFOL N/A 01/02/2013   Procedure: COLONOSCOPY WITH PROPOFOL;  Surgeon: Louis Meckel, MD;  Location: [facility];  Service: Endoscopy;  Laterality: N/A;   ESOPHAGOGASTRODUODENOSCOPY N/A 01/02/2013   Procedure: ESOPHAGOGASTRODUODENOSCOPY (EGD);  Surgeon: Louis Meckel, MD;  Location: [facility];  Service: Endoscopy;  Laterality: N/A;   noraplant     TUBAL LIGATION     TUBAL LIGATION Bilateral    WISDOM TOOTH EXTRACTION     Family History:  Family History  Problem Relation Age of Onset   Breast cancer Mother 35   Asthma Mother    Bipolar disorder Mother    Parkinson's disease Mother    Diabetes Father    Breast cancer Maternal Aunt 89   Esophageal cancer Paternal Aunt        dx >50   Esophageal cancer Paternal Uncle        dx > 50   Breast cancer Maternal Grandmother 68   Emphysema Paternal Grandmother    Colon polyps Paternal Grandfather        unknown #   Family Psychiatric  History: Mother suffers from Bipolar disorder Social History:  Social History   Substance and Sexual Activity  Alcohol Use Not Currently   Comment: socially     Social History   Substance and Sexual Activity  Drug Use No    Social History   Socioeconomic History   Marital status: Divorced    Spouse name: [name]   Number of children: 4   Years of education: Bachelors   Highest education level: Not on file  Occupational History   Occupation: Unemployed  Tobacco Use   Smoking status: Former    Current packs/day: 0.00    Average packs/day: 0.5 packs/day for 20.0 years (10.0 ttl pk-yrs)    Types: Cigarettes    Start date: 02/05/1987    Quit date: 07/30/2006     Years since quitting: 16.6   Smokeless tobacco: Never   Tobacco comments:    quit 7 years ago  Vaping Use   Vaping status: Every Day   Substances: Nicotine  Substance and Sexual Activity   Alcohol use: Not Currently    Comment: socially   Drug use: No   Sexual activity: Never    Birth control/protection: Surgical  Other Topics Concern   Not on file  Social History Narrative   She lives with her husbandLeavenworth), 4 children 21, 15, 13, 10, she stays at home.   Patient has a Bachelor's degree.   Patient is right- handed.   Patient drinks one cup of  coffee in the morning, sometimes 2 cups.         Social Determinants of Health   Financial Resource Strain: High Risk (05/18/2022)   Received from [facility], [facility]   Overall Financial Resource Strain (CARDIA)    Difficulty of Paying Living Expenses: Very hard  Food Insecurity: Food Insecurity Present (11/18/2022)   Hunger Vital Sign    Worried About Running Out of Food in the Last Year: Sometimes true    Ran Out of Food in the Last Year: Sometimes true  Transportation Needs: No Transportation Needs (11/18/2022)   PRAPARE - Administrator, Civil Service (Medical): No    Lack of Transportation (Non-Medical): No  Physical Activity: Inactive (05/18/2022)   Received from [facility], [facility]   Exercise Vital Sign    Days of Exercise per Week: 0 days    Minutes of Exercise per Session: 0 min  Stress: Stress Concern Present (05/18/2022)   Received from [facility], [facility] of Occupational Health - Occupational Stress Questionnaire    Feeling of Stress : Very much  Social Connections: Socially Isolated (05/18/2022)   Received from [facility], [facility]   Social Network    How would you rate your social network (family, work, friends)?: Little participation, lonely and socially isolated   Additional Social History:    Allergies:   Allergies  Allergen Reactions   Bee  Venom Swelling   Latex Other (See Comments), Swelling and Hives    Blisters where the latex touched it   Wheat Anaphylaxis    Headache   Bupropion Swelling and Other (See Comments)    swelling in hands   Bupropion Hcl Other (See Comments)    swelling in hands   Strawberry Extract Other (See Comments) and Rash    Caused facial swelling and rash   Tomato Rash    Face breaks out   Citalopram Hydrobromide Other (See Comments)    Doesn't remember reaction   Other Other (See Comments)    Bleach cause vocal cord to close    Labs:  Results for orders placed or performed during the hospital encounter of 03/16/23 (from the past 48 hour(s))  Rapid urine drug screen (hospital performed)     Status: Abnormal   Collection Time: 03/16/23 10:54 PM  Result Value Ref Range   Opiates NONE DETECTED NONE DETECTED   Cocaine NONE DETECTED NONE DETECTED   Benzodiazepines NONE DETECTED NONE DETECTED   Amphetamines NONE DETECTED NONE DETECTED   Tetrahydrocannabinol POSITIVE (A) NONE DETECTED   Barbiturates NONE DETECTED NONE DETECTED    Comment: (NOTE) DRUG SCREEN FOR MEDICAL PURPOSES ONLY.  IF CONFIRMATION IS NEEDED FOR ANY PURPOSE, NOTIFY LAB WITHIN 5 DAYS.  LOWEST DETECTABLE LIMITS FOR URINE DRUG SCREEN Drug Class                     Cutoff (ng/mL) Amphetamine and metabolites    1000 Barbiturate and metabolites    200 Benzodiazepine                 200 Opiates and metabolites        300 Cocaine and metabolites        300 THC                            50 Performed at [facility], [address] [place], [place] [address]  Magnesium     Status: None   Collection Time: 03/17/23  4:02 AM  Result Value Ref Range   Magnesium 2.3 1.7 - 2.4 mg/dL    Comment: Performed at [facility], [address] [place], [place] [address]  CK     Status: None   Collection Time: 03/17/23  4:02 AM  Result Value Ref Range   Total CK 38 38 - 234 U/L    Comment:  Performed at [facility], [address], [place], [place] [address]  Comprehensive metabolic panel     Status: Abnormal   Collection Time: 03/17/23  4:14 AM  Result Value Ref Range   Sodium 137 135 - 145 mmol/L   Potassium 3.5 3.5 - 5.1 mmol/L   Chloride 103 98 - 111 mmol/L   CO2 26 22 - 32 mmol/L   Glucose, Bld 95 70 - 99 mg/dL    Comment: Glucose reference range applies only to samples taken after fasting for at least 8 hours.   BUN 17 6 - 20 mg/dL   Creatinine, Ser 0.98 0.44 - 1.00 mg/dL   Calcium 8.6 (L) 8.9 - 10.3 mg/dL   Total Protein 7.8 6.5 - 8.1 g/dL   Albumin 3.8 3.5 - 5.0 g/dL   AST 18 15 - 41 U/L   ALT 19 0 - 44 U/L   Alkaline Phosphatase 88 38 - 126 U/L   Total Bilirubin 0.7 0.3 - 1.2 mg/dL   GFR, Estimated >11 >91 mL/min    Comment: (NOTE) Calculated using the CKD-EPI Creatinine Equation (2021)    Anion gap 8 5 - 15    Comment: Performed at [facility], [address], [place], [place] [address]  Ethanol     Status: None   Collection Time: 03/17/23  4:14 AM  Result Value Ref Range   Alcohol, Ethyl (B) <10 <10 mg/dL    Comment: (NOTE) Lowest detectable limit for serum alcohol is 10 mg/dL.  For medical purposes only. Performed at [facility], [address], [place], [place] [address]   Salicylate level     Status: Abnormal   Collection Time: 03/17/23  4:14 AM  Result Value Ref Range   Salicylate Lvl <7.0 (L) 7.0 - 30.0 mg/dL    Comment: Performed at [facility], [address], [place], [place] [address]  Acetaminophen level     Status: Abnormal   Collection Time: 03/17/23  4:14 AM  Result Value Ref Range   Acetaminophen (Tylenol), Serum <10 (L) 10 - 30 ug/mL    Comment: (NOTE) Therapeutic concentrations vary significantly. A range of 10-30 ug/mL  may be an effective concentration for many patients. However, some  are best treated at concentrations outside of this  range. Acetaminophen concentrations >150 ug/mL at 4 hours after ingestion  and >50 ug/mL at 12 hours after ingestion are often associated with  toxic reactions.  Performed at [facility], [address], [place], [place] [address]   cbc     Status: None   Collection Time: 03/17/23  4:14 AM  Result Value Ref Range   WBC 10.0 4.0 - 10.5 K/uL   RBC 4.65 3.87 - 5.11 MIL/uL   Hemoglobin 13.2 12.0 - 15.0 g/dL   HCT 29.5 28.4 - 13.2 %   MCV 89.0 80.0 - 100.0 fL   MCH 28.4 26.0 - 34.0 pg   MCHC 31.9 30.0 - 36.0 g/dL   RDW 44.0 10.2 -  15.5 %   Platelets 285 150 - 400 K/uL   nRBC 0.0 0.0 - 0.2 %    Comment: Performed at [facility], [address], [place], [place] [address]    Current Facility-Administered Medications  Medication Dose Route Frequency Provider Last Rate Last Admin   diphenhydrAMINE (BENADRYL) injection 50 mg  50 mg Intravenous Once Long, Arlyss Repress, MD       DULoxetine (CYMBALTA) DR capsule 40 mg  40 mg Oral Daily Kitt Minardi C, NP       gabapentin (NEURONTIN) capsule 300 mg  300 mg Oral TID Dahlia Byes C, NP       lamoTRIgine (LAMICTAL) tablet 200 mg  200 mg Oral BID Quintyn Dombek C, NP       LORazepam (ATIVAN) injection 2 mg  2 mg Intramuscular Once Long, Arlyss Repress, MD       risperiDONE (RISPERDAL) tablet 1 mg  1 mg Oral QHS Juleon Narang C, NP       traZODone (DESYREL) tablet 150 mg  150 mg Oral QHS Skyra Crichlow C, NP       ziprasidone (GEODON) injection 20 mg  20 mg Intramuscular Once Long, Arlyss Repress, MD       Current Outpatient Medications  Medication Sig Dispense Refill   albuterol (VENTOLIN HFA) 108 (90 Base) MCG/ACT inhaler Inhale 1-2 puffs into the lungs every 6 (six) hours as needed for wheezing or shortness of breath. 18 g 0   clobetasol (TEMOVATE) 0.05 % external solution Apply 1 Application topically 2 (two) times daily.     DULoxetine (CYMBALTA) 60 MG capsule Take 1 capsule (60 mg total) by mouth  daily. (Patient taking differently: Take 40 mg by mouth daily.) 30 capsule 0   gabapentin (NEURONTIN) 300 MG capsule Take 1 capsule (300 mg total) by mouth 3 (three) times daily.     HYDROcodone-acetaminophen (NORCO) 10-325 MG tablet Take 1 tablet by mouth 6 (six) times daily as needed for pain. 180 tablet 0   hydrOXYzine (ATARAX/VISTARIL) 25 MG tablet Take 1 tablet (25 mg total) by mouth 3 (three) times daily as needed for anxiety. 90 tablet 0   iron polysaccharides (NIFEREX) 150 MG capsule Take 150 mg by mouth daily.     lamoTRIgine (LAMICTAL) 200 MG tablet Take 200 mg by mouth 2 (two) times daily.     meloxicam (MOBIC) 15 MG tablet Take 1 tablet by mouth daily.     naloxone (NARCAN) nasal spray 4 mg/0.1 mL Place 1 spray into the nose once. Prn opioid overdose     nicotine polacrilex (NICORETTE) 2 MG gum Take 1 each (2 mg total) by mouth every 4 (four) hours as needed for smoking cessation. 100 tablet 0   nystatin powder Apply 1 Application topically 2 (two) times daily. Groin area & sacral area     omeprazole (PRILOSEC) 40 MG capsule Take 1 capsule (40 mg total) by mouth daily. 30 capsule 0   tiZANidine (ZANAFLEX) 4 MG tablet Take 6 mg by mouth every 8 (eight) hours as needed for muscle spasms. For back spasms     traZODone (DESYREL) 150 MG tablet Take 150 mg by mouth daily.     Facility-Administered Medications Ordered in Other Encounters  Medication Dose Route Frequency Provider Last Rate Last Admin   fentaNYL (SUBLIMAZE) injection 25 mcg  25 mcg Intravenous Q5 min PRN Yves Dill, MD       ondansetron [facility]) injection 4 mg  4 mg Intravenous Once PRN Noralyn Pick,  Renae Fickle, MD        Musculoskeletal: Strength & Muscle Tone: within normal limits Gait & Station: normal Patient leans:  see above   Psychiatric Specialty Exam: Presentation  General Appearance:  Casual (Obese)  Eye Contact: Fleeting  Speech: Clear and Coherent; Normal Rate  Speech  Volume: Normal  Handedness: Right   Mood and Affect  Mood: Anxious; Depressed; Irritable; Labile  Affect: Congruent; Depressed; Labile   Thought Process  Thought Processes: Coherent; Goal Directed  Descriptions of Associations:Intact  Orientation:Full (Time, Place and Person)  Thought Content:Logical  History of Schizophrenia/Schizoaffective disorder:No  Duration of Psychotic Symptoms:No data recorded Hallucinations:Hallucinations: None  Ideas of Reference:None  Suicidal Thoughts:Suicidal Thoughts: No  Homicidal Thoughts:Homicidal Thoughts: No   Sensorium  Memory: Immediate Good; Recent Good; Remote Good  Judgment: Impaired  Insight: Lacking   Executive Functions  Concentration: Fair  Attention Span: Fair  Recall: Fair  Fund of Knowledge: Fair  Language: Good   Psychomotor Activity  Psychomotor Activity: Psychomotor Activity: Normal   Assets  Assets: Manufacturing systems engineer; Housing; Intimacy    Sleep  Sleep: Sleep: Poor   Physical Exam: Physical Exam Vitals and nursing note reviewed.  Constitutional:      Appearance: She is obese.  HENT:     Head: Normocephalic.     Nose: Nose normal.  Cardiovascular:     Rate and Rhythm: Normal rate and regular rhythm.  Pulmonary:     Effort: Pulmonary effort is normal.  Musculoskeletal:     Cervical back: Normal range of motion.     Comments: Reports back pain, completed Steroid taper yesterday  Skin:    General: Skin is dry.  Neurological:     Mental Status: She is alert and oriented to person, place, and time.  Psychiatric:        Attention and Perception: Attention and perception normal.        Mood and Affect: Mood is depressed. Affect is tearful.        Speech: Speech is rapid and pressured and tangential.        Behavior: Behavior is cooperative.        Thought Content: Thought content is paranoid.        Cognition and Memory: Cognition and memory normal.        Judgment:  Judgment is inappropriate.    Review of Systems  Constitutional: Negative.   HENT: Negative.    Eyes: Negative.   Respiratory: Negative.    Cardiovascular: Negative.   Gastrointestinal: Negative.   Genitourinary: Negative.   Musculoskeletal:  Positive for back pain.  Skin: Negative.   Neurological: Negative.   Endo/Heme/Allergies: Negative.   Psychiatric/Behavioral:  Positive for depression and substance abuse. The patient is nervous/anxious and has insomnia.    Blood pressure (!) 152/115, pulse 83, temperature 98.5 F (36.9 C), temperature source Oral, resp. rate 19, last menstrual period 09/21/2015, SpO2 98%. There is no height or weight on file to calculate BMI.  Medical Decision Making: Patient meets criteria for inpatient hospitalization due to not taking her medications, paranoia and depression. She just completed steroid and that could be a reason for Psychosis.  We will fax out records to facilities for bed availability.  Problem 1: Bipolar disorder, depressed type- with Psychotic features.  Disposition:  admit and seek bed placement  Earney Navy, NP-PMHNP-BC 03/17/2023 12:14 PM

## 2023-03-18 ENCOUNTER — Encounter: Payer: Self-pay | Admitting: Hematology & Oncology

## 2023-03-18 DIAGNOSIS — F315 Bipolar disorder, current episode depressed, severe, with psychotic features: Secondary | ICD-10-CM

## 2023-03-18 MED ORDER — LIDOCAINE 5 % EX PTCH
1.0000 | MEDICATED_PATCH | CUTANEOUS | Status: DC
  Administered 2023-03-18 – 2023-03-19 (×2): 1 via TRANSDERMAL
  Filled 2023-03-18 (×3): qty 1

## 2023-03-18 MED ORDER — IBUPROFEN 200 MG PO TABS: 600.0000 mg | ORAL_TABLET | Freq: Four times a day (QID) | ORAL | Status: DC | PRN

## 2023-03-18 NOTE — ED Notes (Signed)
Patient has been alert and oriented this shift. Patient calm and cooperative.  Patient has been appropriate with staff. Patient medication compliant. Anxiety noted.

## 2023-03-18 NOTE — ED Notes (Signed)
Pt made phone call to # [phone].

## 2023-03-18 NOTE — ED Provider Notes (Signed)
Emergency Medicine Observation Re-evaluation Note  Stacey [name] is a 57 y.o. female, seen on rounds today.  Pt initially presented to the ED for complaints of Insomnia Currently, the patient is awake and alert.  Pt presented with insomnia and depression.  She is feeling better this am, but c/o back pain.  No problems over night.  Physical Exam  BP 131/82 (BP Location: Left Arm)   Pulse 92   Temp 98.4 F (36.9 C) (Oral)   Resp 18   LMP 09/21/2015   SpO2 100%  Physical Exam General: awake and alert Cardiac: rr Lungs: clear Psych: calm  ED Course / MDM  EKG:   I have reviewed the labs performed to date as well as medications administered while in observation.  Recent changes in the last 24 hours include TTS eval.  Plan  Current plan is for inpatient psych placement.  Ibuprofen ordered prn for back pain.  Pt already has a lidocaine patch and fentanyl prn.    Jacalyn Lefevre, MD 03/18/23 [phone]

## 2023-03-18 NOTE — ED Notes (Signed)
Pt was provided with 2 peanut butter cups, per RN.

## 2023-03-18 NOTE — Progress Notes (Addendum)
Pt was accepted to [facility] TOMORROW 03/19/23; Bed Assignment 309 PENDING 1st/morning shift discharges. Address: [address] [place], [place], [place] [address]  DX: Bipolar D.O.   Pt meets inpatient criteria per Alona Bene, PMHNP  Attending Physician will be Dr. Shellee Milo  Report can be called to: - [phone] (please do no call report until [name] confirms with care team about transfer.)  Pt can arrive after: 08:30am (Conformation will come from [facility] and [facility] Charge RN).  Care Team notified: Alona Bene, PMHNP, Day [facility] Malva Limes, RN, Night [facility] Edythe Clarity, RN, [place], LCSW, [facility]   [place], [place] 03/18/2023 @ 7:20 PM
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
## 2023-03-18 NOTE — ED Notes (Signed)
Pt provided with water cup, per RN.

## 2023-03-19 ENCOUNTER — Encounter: Payer: Self-pay | Admitting: Psychiatry

## 2023-03-19 ENCOUNTER — Other Ambulatory Visit: Payer: Self-pay

## 2023-03-19 ENCOUNTER — Inpatient Hospital Stay
Admission: AD | Admit: 2023-03-19 | Discharge: 2023-03-27 | DRG: 885 | Disposition: A | Discharge status: Home / Self Care | Payer: MEDICAID | Source: Intra-hospital | Attending: Psychiatry | Admitting: Psychiatry

## 2023-03-19 DIAGNOSIS — J45909 Unspecified asthma, uncomplicated: Secondary | ICD-10-CM | POA: Diagnosis present

## 2023-03-19 DIAGNOSIS — Z91048 Other nonmedicinal substance allergy status: Secondary | ICD-10-CM

## 2023-03-19 DIAGNOSIS — T7411XA Adult physical abuse, confirmed, initial encounter: Secondary | ICD-10-CM | POA: Diagnosis present

## 2023-03-19 DIAGNOSIS — Z5986 Financial insecurity: Secondary | ICD-10-CM | POA: Diagnosis not present

## 2023-03-19 DIAGNOSIS — G4700 Insomnia, unspecified: Secondary | ICD-10-CM | POA: Diagnosis present

## 2023-03-19 DIAGNOSIS — Z91018 Allergy to other foods: Secondary | ICD-10-CM

## 2023-03-19 DIAGNOSIS — M542 Cervicalgia: Secondary | ICD-10-CM | POA: Diagnosis present

## 2023-03-19 DIAGNOSIS — M549 Dorsalgia, unspecified: Secondary | ICD-10-CM | POA: Diagnosis present

## 2023-03-19 DIAGNOSIS — G4733 Obstructive sleep apnea (adult) (pediatric): Secondary | ICD-10-CM | POA: Diagnosis present

## 2023-03-19 DIAGNOSIS — Z91030 Bee allergy status: Secondary | ICD-10-CM

## 2023-03-19 DIAGNOSIS — F419 Anxiety disorder, unspecified: Secondary | ICD-10-CM | POA: Diagnosis present

## 2023-03-19 DIAGNOSIS — Z9152 Personal history of nonsuicidal self-harm: Secondary | ICD-10-CM

## 2023-03-19 DIAGNOSIS — E119 Type 2 diabetes mellitus without complications: Secondary | ICD-10-CM | POA: Diagnosis present

## 2023-03-19 DIAGNOSIS — F3112 Bipolar disorder, current episode manic without psychotic features, moderate: Secondary | ICD-10-CM | POA: Diagnosis present

## 2023-03-19 DIAGNOSIS — F22 Delusional disorders: Secondary | ICD-10-CM | POA: Diagnosis present

## 2023-03-19 DIAGNOSIS — Z9151 Personal history of suicidal behavior: Secondary | ICD-10-CM

## 2023-03-19 DIAGNOSIS — Z818 Family history of other mental and behavioral disorders: Secondary | ICD-10-CM | POA: Diagnosis not present

## 2023-03-19 DIAGNOSIS — G8929 Other chronic pain: Secondary | ICD-10-CM | POA: Diagnosis present

## 2023-03-19 DIAGNOSIS — Z91040 Latex allergy status: Secondary | ICD-10-CM

## 2023-03-19 DIAGNOSIS — D563 Thalassemia minor: Secondary | ICD-10-CM | POA: Diagnosis present

## 2023-03-19 DIAGNOSIS — Z1152 Encounter for screening for COVID-19: Secondary | ICD-10-CM | POA: Diagnosis not present

## 2023-03-19 DIAGNOSIS — K219 Gastro-esophageal reflux disease without esophagitis: Secondary | ICD-10-CM | POA: Diagnosis present

## 2023-03-19 DIAGNOSIS — F17290 Nicotine dependence, other tobacco product, uncomplicated: Secondary | ICD-10-CM | POA: Diagnosis present

## 2023-03-19 DIAGNOSIS — Z560 Unemployment, unspecified: Secondary | ICD-10-CM | POA: Diagnosis not present

## 2023-03-19 DIAGNOSIS — M797 Fibromyalgia: Secondary | ICD-10-CM | POA: Diagnosis present

## 2023-03-19 DIAGNOSIS — Z5941 Food insecurity: Secondary | ICD-10-CM | POA: Diagnosis not present

## 2023-03-19 DIAGNOSIS — Z825 Family history of asthma and other chronic lower respiratory diseases: Secondary | ICD-10-CM

## 2023-03-19 DIAGNOSIS — F319 Bipolar disorder, unspecified: Secondary | ICD-10-CM | POA: Diagnosis present

## 2023-03-19 DIAGNOSIS — F4310 Post-traumatic stress disorder, unspecified: Secondary | ICD-10-CM | POA: Diagnosis present

## 2023-03-19 DIAGNOSIS — Z833 Family history of diabetes mellitus: Secondary | ICD-10-CM

## 2023-03-19 DIAGNOSIS — K5900 Constipation, unspecified: Secondary | ICD-10-CM | POA: Diagnosis present

## 2023-03-19 DIAGNOSIS — Z79899 Other long term (current) drug therapy: Secondary | ICD-10-CM

## 2023-03-19 DIAGNOSIS — Z9851 Tubal ligation status: Secondary | ICD-10-CM

## 2023-03-19 MED ORDER — HYDROXYZINE HCL 25 MG PO TABS
25.0000 mg | ORAL_TABLET | Freq: Three times a day (TID) | ORAL | Status: DC | PRN
  Administered 2023-03-22 – 2023-03-27 (×9): 25 mg via ORAL
  Filled 2023-03-19 (×8): qty 1

## 2023-03-19 MED ORDER — IBUPROFEN 600 MG PO TABS
600.0000 mg | ORAL_TABLET | Freq: Four times a day (QID) | ORAL | Status: DC | PRN
  Administered 2023-03-19 – 2023-03-27 (×17): 600 mg via ORAL
  Filled 2023-03-19 (×17): qty 1

## 2023-03-19 MED ORDER — TRAZODONE HCL 50 MG PO TABS
150.0000 mg | ORAL_TABLET | Freq: Every day | ORAL | Status: DC
  Administered 2023-03-19 – 2023-03-26 (×8): 150 mg via ORAL
  Filled 2023-03-19 (×8): qty 1

## 2023-03-19 MED ORDER — LORAZEPAM 2 MG PO TABS: 2.0000 mg | ORAL_TABLET | Freq: Three times a day (TID) | ORAL | Status: DC | PRN

## 2023-03-19 MED ORDER — LAMOTRIGINE 100 MG PO TABS
200.0000 mg | ORAL_TABLET | Freq: Two times a day (BID) | ORAL | Status: DC
  Administered 2023-03-19 – 2023-03-27 (×17): 200 mg via ORAL
  Filled 2023-03-19 (×18): qty 2

## 2023-03-19 MED ORDER — LORAZEPAM 2 MG/ML IJ SOLN: 2.0000 mg | Freq: Three times a day (TID) | INTRAMUSCULAR | Status: DC | PRN

## 2023-03-19 MED ORDER — MAGNESIUM HYDROXIDE 400 MG/5ML PO SUSP
30.0000 mL | Freq: Every day | ORAL | Status: DC | PRN
  Administered 2023-03-21 – 2023-03-23 (×4): 30 mL via ORAL
  Filled 2023-03-19 (×6): qty 30

## 2023-03-19 MED ORDER — HALOPERIDOL LACTATE 5 MG/ML IJ SOLN: 5.0000 mg | Freq: Three times a day (TID) | INTRAMUSCULAR | Status: DC | PRN

## 2023-03-19 MED ORDER — DULOXETINE HCL 20 MG PO CPEP
40.0000 mg | ORAL_CAPSULE | Freq: Every day | ORAL | Status: DC
  Administered 2023-03-20 – 2023-03-25 (×6): 40 mg via ORAL
  Filled 2023-03-19 (×6): qty 2

## 2023-03-19 MED ORDER — RISPERIDONE 1 MG PO TABS
1.0000 mg | ORAL_TABLET | Freq: Every day | ORAL | Status: DC
  Administered 2023-03-19: 1 mg via ORAL
  Filled 2023-03-19: qty 1

## 2023-03-19 MED ORDER — HALOPERIDOL 5 MG PO TABS: 5.0000 mg | ORAL_TABLET | Freq: Three times a day (TID) | ORAL | Status: DC | PRN

## 2023-03-19 MED ORDER — GABAPENTIN 300 MG PO CAPS
300.0000 mg | ORAL_CAPSULE | Freq: Three times a day (TID) | ORAL | Status: DC
  Administered 2023-03-19 – 2023-03-27 (×26): 300 mg via ORAL
  Filled 2023-03-19 (×25): qty 1

## 2023-03-19 MED ORDER — DIPHENHYDRAMINE HCL 25 MG PO CAPS: 50.0000 mg | ORAL_CAPSULE | Freq: Three times a day (TID) | ORAL | Status: DC | PRN

## 2023-03-19 MED ORDER — LIDOCAINE 5 % EX PTCH
1.0000 | MEDICATED_PATCH | CUTANEOUS | Status: DC
  Administered 2023-03-20 – 2023-03-24 (×5): 1 via TRANSDERMAL
  Filled 2023-03-19 (×5): qty 1

## 2023-03-19 MED ORDER — DIPHENHYDRAMINE HCL 50 MG/ML IJ SOLN: 50.0000 mg | Freq: Three times a day (TID) | INTRAMUSCULAR | Status: DC | PRN

## 2023-03-19 MED ORDER — ACETAMINOPHEN 325 MG PO TABS
650.0000 mg | ORAL_TABLET | Freq: Four times a day (QID) | ORAL | Status: DC | PRN
  Administered 2023-03-19 – 2023-03-27 (×15): 650 mg via ORAL
  Filled 2023-03-19 (×17): qty 2

## 2023-03-19 MED ORDER — ALUM & MAG HYDROXIDE-SIMETH 200-200-20 MG/5ML PO SUSP: 30.0000 mL | ORAL | Status: DC | PRN

## 2023-03-19 NOTE — Group Note (Signed)
Date:  03/19/2023 Time:  11:12 AM  Group Topic/Focus:  Goals Group:   The focus of this group is to help patients establish daily goals to achieve during treatment and discuss how the patient can incorporate goal setting into their daily lives to aide in recovery.    Participation Level:  Did Not Attend   Natalye Kott 03/19/2023, 11:12 AM

## 2023-03-19 NOTE — Group Note (Signed)
Recreation Therapy Group Note   Group Topic:General Recreation  Group Date: 03/19/2023 Start Time: 1000 End Time: 1100 Facilitators: Rosina Lowenstein, LRT, CTRS Location:  Courtyard  Group Description: Outdoor Recreation. Patients had the option to play basketball, corn hole or sit and listen to music while outside in the courtyard getting fresh air and sunlight. LRT and pts discussed things that they enjoy doing in their free time outside of the hospital.   Goal Area(s) Addressed: Patient will identify leisure interests.  Patient will practice healthy decision making. Patient will engage in recreation activity.   Affect/Mood: N/A   Participation Level: Did not attend    Clinical Observations/Individualized Feedback: Stacey [name] did not attend group due to not being a new admission.  Plan: Continue to engage patient in RT group sessions 2-3x/week.   Rosina Lowenstein, LRT, CTRS 03/20/2023 11:34 AM
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
## 2023-03-19 NOTE — ED Notes (Signed)
Patient off unit to [facility] per provider.   Patient calm, cooperative, no s/s of distress. Patient discharge information and belongings given to [facility] for transport. Patient ambulatory off unit, escorted and transported by [facility].

## 2023-03-19 NOTE — Plan of Care (Signed)
Pt denies SI/HI/AVH and compliant with medication administration per MD orders and procedures on the unit  Problem: Education: Goal: Knowledge of [facility] Education information/materials will improve Outcome: Progressing Goal: Emotional status will improve Outcome: Progressing Goal: Mental status will improve Outcome: Progressing Goal: Verbalization of understanding the information provided will improve Outcome: Progressing   Problem: Health Behavior/Discharge Planning: Goal: Identification of resources available to assist in meeting health care needs will improve Outcome: Progressing Goal: Compliance with treatment plan for underlying cause of condition will improve Outcome: Progressing   Problem: Activity: Goal: Will identify at least one activity in which they can participate Outcome: Progressing   Problem: Coping: Goal: Ability to identify and develop effective coping behavior will improve Outcome: Progressing Goal: Ability to interact with others will improve Outcome: Progressing Goal: Demonstration of participation in decision-making regarding own care will improve Outcome: Progressing Goal: Ability to use eye contact when communicating with others will improve Outcome: Progressing   Problem: Self-Concept: Goal: Will verbalize positive feelings about self Outcome: Progressing

## 2023-03-19 NOTE — ED Provider Notes (Signed)
Patient awake and alert, ambulatory, has been accepted to [place] [facility], Dr. Marlou Porch is excepting.    Stacey Munch, MD 03/19/23 [phone]
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
## 2023-03-19 NOTE — Tx Team (Signed)
Initial Treatment Plan 03/19/2023 11:55 AM Stacey [name] Husch ZOX:[id number]    PATIENT STRESSORS: Financial difficulties   Marital or family conflict   Medication change or noncompliance   Traumatic event     PATIENT STRENGTHS: Average or above average intelligence  Communication skills  Motivation for treatment/growth  Physical Health    PATIENT IDENTIFIED PROBLEMS: Homelessness  Financial stress  Non compliant with medications                 DISCHARGE CRITERIA:  Medical problems require only outpatient monitoring Motivation to continue treatment in a less acute level of care Verbal commitment to aftercare and medication compliance  PRELIMINARY DISCHARGE PLAN: Attend aftercare/continuing care group Placement in alternative living arrangements  PATIENT/FAMILY INVOLVEMENT: This treatment plan has been presented to and reviewed with the patient, Stacey [name], and/or family member,.  The patient and family have been given the opportunity to ask questions and make suggestions.  Leonarda Salon, RN 03/19/2023, 11:55 AM
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
## 2023-03-19 NOTE — Group Note (Signed)
Date:  03/19/2023 Time:  5:07 PM  Group Topic/Focus:  Managing Feelings:   The focus of this group is to identify what feelings patients have difficulty handling and develop a plan to handle them in a healthier way upon discharge.    Participation Level:  Active  Participation Quality:  Appropriate  Affect:  Appropriate  Cognitive:  Alert and Appropriate  Insight: Appropriate  Engagement in Group:  Developing/Improving and Supportive  Modes of Intervention:  Activity, Discussion, and Rapport Building  Additional Comments:    Stacey [name] 03/19/2023, 5:07 PM

## 2023-03-19 NOTE — ED Notes (Signed)
Pt had uneventful night and has been compliant with taking medications. However this morning pt appears more anxious and has made numerous trips to nurse's station, requesting various things, including paper and pen stating she wants to write a letter, then requesting photocopies of the letter, stamp and envelope for her letter to be mailed. Pt provided paper and crayon as requested. Pt also voices to this nurse that she does not want to be moved to a different facility today, and she wants to sign herself out.

## 2023-03-20 DIAGNOSIS — F319 Bipolar disorder, unspecified: Secondary | ICD-10-CM | POA: Diagnosis not present

## 2023-03-20 MED ORDER — NICOTINE POLACRILEX 2 MG MT GUM
2.0000 mg | CHEWING_GUM | OROMUCOSAL | Status: DC | PRN
  Administered 2023-03-20 – 2023-03-27 (×35): 2 mg via ORAL
  Filled 2023-03-20 (×37): qty 1

## 2023-03-20 MED ORDER — RISPERIDONE 1 MG PO TABS
1.5000 mg | ORAL_TABLET | Freq: Every day | ORAL | Status: DC
  Administered 2023-03-20 – 2023-03-22 (×3): 1.5 mg via ORAL
  Filled 2023-03-20 (×3): qty 2

## 2023-03-20 NOTE — Group Note (Signed)
Date:  03/20/2023 Time:  6:52 PM  Group Topic/Focus:  OUTDOOR RECREATION WELLNESS THROUGH MUSIC AND STRUCTURED ACTIVITY WHILE BUILDING THERAPEUTIC RAPPORT.    Participation Level:  Active  Participation Quality:  Appropriate and Attentive  Affect:  Appropriate and Excited  Cognitive:  Alert, Appropriate, and Oriented  Insight: Appropriate  Engagement in Group:  Developing/Improving, Engaged, and Supportive  Modes of Intervention:  Activity, Discussion, Rapport Building, and Socialization  Additional Comments:    Rosaura Carpenter 03/20/2023, 6:52 PM

## 2023-03-20 NOTE — Group Note (Signed)
Date:  03/20/2023 Time:  9:37 PM  Group Topic/Focus:  Developing a Wellness Toolbox:   The focus of this group is to help patients develop a "wellness toolbox" with skills and strategies to promote recovery upon discharge.    Participation Level:  Did Not Attend   Lenore Cordia 03/20/2023, 9:37 PM

## 2023-03-20 NOTE — Group Note (Signed)
Date:  03/20/2023 Time:  1:34 AM  Group Topic/Focus:  Wrap-Up Group:   The focus of this group is to help patients review their daily goal of treatment and discuss progress on daily workbooks.    Participation Level:  Active  Participation Quality:  Appropriate  Affect:  Appropriate  Cognitive:  Alert and Appropriate  Insight: Appropriate  Engagement in Group:  Limited  Modes of Intervention:  Discussion  Additional Comments:     Maglione,Norm Wray E 03/20/2023, 1:34 AM

## 2023-03-20 NOTE — Progress Notes (Signed)
Suicide Risk Assessment  Admission Assessment    [facility] Admission Suicide Risk Assessment   Nursing information obtained from:  Patient Demographic factors:  Caucasian, Unemployed Current Mental Status:  NA Loss Factors:  Financial problems / change in socioeconomic status Historical Factors:  Prior suicide attempts Risk Reduction Factors:  Religious beliefs about death  Total Time spent with patient: 1 hour Principal Problem: Bipolar 1 disorder (HCC) Diagnosis:  Principal Problem:   Bipolar 1 disorder (HCC)  Subjective Data: 57 y/o female w/ history of mdd, bipolar 1 disorder, admitted to inpatient psychiatry under IVC petition. Per initial notes in the emergency department, was reported she had not slept in 4 days, was noted to have erratic behavior and excessive talking. Was seen by psychiatry, where she was noted to be confused, disorganized, tangential, paranoid. Was recommended for inpatient psychiatry and transferred to this unit for continued care.  Continued Clinical Symptoms:  Alcohol Use Disorder Identification Test Final Score (AUDIT): 1 The "Alcohol Use Disorders Identification Test", Guidelines for Use in Primary Care, Second Edition.  [employer] writer [facility]). Score between 0-7:  no or low risk or alcohol related problems. Score between 8-15:  moderate risk of alcohol related problems. Score between 16-19:  high risk of alcohol related problems. Score 20 or above:  warrants further diagnostic evaluation for alcohol dependence and treatment.   CLINICAL FACTORS:   Previous Psychiatric Diagnoses and Treatments   Musculoskeletal: Strength & Muscle Tone: within normal limits Gait & Station: normal Patient leans: N/A  Psychiatric Specialty Exam:  Presentation  General Appearance:  Appropriate for Environment; Fairly Groomed  Eye Contact: Fair  Speech: Clear and Coherent; Normal Rate; Other (comment) (hyperverbal)  Speech  Volume: Normal  Handedness: Right   Mood and Affect  Mood: Anxious; Depressed  Affect: Other (comment) (overall appropriate, mildly labile)   Thought Process  Thought Processes: Coherent  Descriptions of Associations:Circumstantial  Orientation:Full (Time, Place and Person)  Thought Content:Paranoid Ideation  History of Schizophrenia/Schizoaffective disorder:Yes  Duration of Psychotic Symptoms:Less than six months  Hallucinations:Hallucinations: None  Ideas of Reference:Paranoia  Suicidal Thoughts:Suicidal Thoughts: No  Homicidal Thoughts:Homicidal Thoughts: No   Sensorium  Memory: Immediate Fair  Judgment: Other (comment)  Insight: Present   Executive Functions  Concentration: Fair  Attention Span: Fair  Recall: Fair  Fund of Knowledge: Fair  Language: Fair   Psychomotor Activity  Psychomotor Activity:Psychomotor Activity: Normal   Assets  Assets: Communication Skills; Desire for Improvement; Financial Resources/Insurance; Leisure Time; Resilience; Social Support   Sleep  Sleep:Sleep: Poor    Physical Exam: Physical Exam see admission ROS see admission Blood pressure 121/84, pulse 84, temperature 99.4 F (37.4 C), temperature source Oral, resp. rate 18, height 5\' 8"  (1.727 m), weight 111.6 kg, last menstrual period 09/21/2015, SpO2 100%. Body mass index is 37.4 kg/m.  COGNITIVE FEATURES THAT CONTRIBUTE TO RISK:  Paranoia    SUICIDE RISK:  Minimal: No identifiable suicidal ideation.  Patients presenting with no risk factors but with morbid ruminations; may be classified as minimal risk based on the severity of the depressive symptoms  PLAN OF CARE:  Daily contact with patient to assess and evaluate symptoms and progress in treatment Medication management  I certify that inpatient services furnished can reasonably be expected to improve the patient's condition.   Lauree Chandler, NP 03/20/2023, 1:38 PM
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
## 2023-03-20 NOTE — Group Note (Signed)
Recreation Therapy Group Note   Group Topic:Relaxation  Group Date: 03/20/2023 Start Time: 1000 End Time: 1100 Facilitators: Rosina Lowenstein, LRT, CTRS Location: Courtyard  Group Description: Meditation. LRT and patients discussed what they know about meditation and mindfulness. LRT played a Deep Breathing Meditation exercise script for patients to follow along to. LRT and patients discussed how meditation and deep breathing can be used as a coping skill post--discharge. After the meditation, LRT took song requests from pts to listen to while getting fresh air and sunlight.    Goal Area(s) Addressed: Patient will practice using relaxation technique. Patient will identify a new coping skill.  Patient will follow multistep directions to reduce anxiety and stress.   Affect/Mood: Appropriate   Participation Level: Moderate   Participation Quality: Independent   Behavior: Calm and Cooperative   Speech/Thought Process: Coherent   Insight: Fair   Judgement: Good   Modes of Intervention: Activity   Patient Response to Interventions:  Receptive   Education Outcome:  Acknowledges education   Clinical Observations/Individualized Feedback: Stacey [name] was mostly active in their participation of session activities and group discussion. Pt came late to group and was not present for meditation section of group, however she came outside and listened to music while looking around at the different plants outside. Pt interacted well with LRT and peers duration of session.   Plan: Continue to engage patient in RT group sessions 2-3x/week.   Rosina Lowenstein, LRT, CTRS 03/20/2023 11:47 AM

## 2023-03-20 NOTE — Group Note (Signed)
Date:  03/20/2023 Time:  3:59 PM  Group Topic/Focus:  Goals Group:   The focus of this group is to help patients establish daily goals to achieve during treatment and discuss how the patient can incorporate goal setting into their daily lives to aide in recovery.  Progressive Muscle Relaxation   Participation Level:  Active  Participation Quality:  Appropriate, Attentive, and Supportive  Affect:  Appropriate  Cognitive:  Alert and Appropriate  Insight: Appropriate  Engagement in Group:  Engaged and Supportive  Modes of Intervention:  Activity, Discussion, and Education  Additional Comments:    Stacey [name] [name] 03/20/2023, 3:59 PM

## 2023-03-20 NOTE — BH IP Treatment Plan (Signed)
Interdisciplinary Treatment and Diagnostic Plan Update  03/20/2023 Time of Session: 9:22AM Stacey [name] MRN: [id number]  Principal Diagnosis: Bipolar 1 disorder [facility])  Secondary Diagnoses: Principal Problem:   Bipolar 1 disorder (HCC)   Current Medications:  Current Facility-Administered Medications  Medication Dose Route Frequency Provider Last Rate Last Admin   acetaminophen (TYLENOL) tablet 650 mg  650 mg Oral Q6H PRN Motley-Mangrum, Jadeka A, PMHNP   650 mg at 03/20/23 0608   alum & mag hydroxide-simeth (MAALOX/MYLANTA) 200-200-20 MG/5ML suspension 30 mL  30 mL Oral Q4H PRN Motley-Mangrum, Jadeka A, PMHNP       diphenhydrAMINE (BENADRYL) capsule 50 mg  50 mg Oral TID PRN Motley-Mangrum, Jadeka A, PMHNP       Or   diphenhydrAMINE (BENADRYL) injection 50 mg  50 mg Intramuscular TID PRN Motley-Mangrum, Jadeka A, PMHNP       DULoxetine (CYMBALTA) DR capsule 40 mg  40 mg Oral Daily Motley-Mangrum, Jadeka A, PMHNP   40 mg at 03/20/23 0900   gabapentin (NEURONTIN) capsule 300 mg  300 mg Oral TID Motley-Mangrum, Jadeka A, PMHNP   300 mg at 03/20/23 0901   haloperidol (HALDOL) tablet 5 mg  5 mg Oral TID PRN Motley-Mangrum, Jadeka A, PMHNP       Or   haloperidol lactate (HALDOL) injection 5 mg  5 mg Intramuscular TID PRN Motley-Mangrum, Geralynn Ochs A, PMHNP       hydrOXYzine (ATARAX) tablet 25 mg  25 mg Oral TID PRN Motley-Mangrum, Jadeka A, PMHNP       ibuprofen (ADVIL) tablet 600 mg  600 mg Oral Q6H PRN Motley-Mangrum, Jadeka A, PMHNP   600 mg at 03/20/23 0608   lamoTRIgine (LAMICTAL) tablet 200 mg  200 mg Oral BID Motley-Mangrum, Jadeka A, PMHNP   200 mg at 03/20/23 0900   lidocaine (LIDODERM) 5 % 1 patch  1 patch Transdermal Q24H Motley-Mangrum, Jadeka A, PMHNP   1 patch at 03/20/23 4098   LORazepam (ATIVAN) tablet 2 mg  2 mg Oral TID PRN Motley-Mangrum, Geralynn Ochs A, PMHNP       Or   LORazepam (ATIVAN) injection 2 mg  2 mg Intramuscular TID PRN Motley-Mangrum, Jadeka A, PMHNP        magnesium hydroxide (MILK OF MAGNESIA) suspension 30 mL  30 mL Oral Daily PRN Motley-Mangrum, Jadeka A, PMHNP       nicotine polacrilex (NICORETTE) gum 2 mg  2 mg Oral PRN Lauree Chandler, NP       risperiDONE (RISPERDAL) tablet 1.5 mg  1.5 mg Oral QHS Lauree Chandler, NP       traZODone (DESYREL) tablet 150 mg  150 mg Oral QHS Motley-Mangrum, Jadeka A, PMHNP   150 mg at 03/19/23 2158   Facility-Administered Medications Ordered in Other Encounters  Medication Dose Route Frequency Provider Last Rate Last Admin   fentaNYL (SUBLIMAZE) injection 25 mcg  25 mcg Intravenous Q5 min PRN Yves Dill, MD       ondansetron [facility]) injection 4 mg  4 mg Intravenous Once PRN Yves Dill, MD       PTA Medications: Medications Prior to Admission  Medication Sig Dispense Refill Last Dose   albuterol (VENTOLIN HFA) 108 (90 Base) MCG/ACT inhaler Inhale 1-2 puffs into the lungs every 6 (six) hours as needed for wheezing or shortness of breath. (Patient taking differently: Inhale 1 puff into the lungs in the morning and at bedtime.) 18 g 0    cetirizine (ZYRTEC) 10 MG tablet Take 10 mg by  mouth daily.      clobetasol (TEMOVATE) 0.05 % external solution Apply 1 Application topically as needed (psoriasis).      cyclobenzaprine (FLEXERIL) 10 MG tablet Take 1 tablet 3 times a day by oral route as needed for 7 days. (Patient not taking: Reported on 03/17/2023)      diclofenac Sodium (VOLTAREN) 1 % GEL Apply 1 Application topically 3 (three) times daily as needed (pain).      DULoxetine HCl 40 MG CPEP Take 40 mg by mouth daily.      gabapentin (NEURONTIN) 300 MG capsule Take 1 capsule (300 mg total) by mouth 3 (three) times daily.      HYDROcodone-acetaminophen (NORCO) 10-325 MG tablet Take 1 tablet by mouth 6 (six) times daily as needed for pain. (Patient not taking: Reported on 03/17/2023) 180 tablet 0    hydrOXYzine (VISTARIL) 25 MG capsule Take 25 mg by mouth 4 (four) times daily as needed for anxiety.       lamoTRIgine (LAMICTAL) 200 MG tablet Take 200 mg by mouth 2 (two) times daily.      LIDOCAINE EX Apply 1 patch topically as needed (pain).      lurasidone (LATUDA) 40 MG TABS tablet Take 40 mg by mouth daily with breakfast.      methylPREDNISolone (MEDROL DOSEPAK) 4 MG TBPK tablet See admin instructions. follow package directions (Patient not taking: Reported on 03/17/2023)      naloxone [facility]) nasal spray 4 mg/0.1 mL Place 1 spray into the nose once. Prn opioid overdose      nystatin powder Apply 1 Application topically 2 (two) times daily. Groin area & sacral area (Patient not taking: Reported on 03/17/2023)      tiZANidine (ZANAFLEX) 4 MG tablet Take 6 mg by mouth as needed for muscle spasms. For back spasms      traZODone (DESYREL) 150 MG tablet Take 150 mg by mouth at bedtime as needed for sleep.       Patient Stressors: Financial difficulties   Marital or family conflict   Medication change or noncompliance   Traumatic event    Patient Strengths: Average or above average intelligence  Communication skills  Motivation for treatment/growth  Physical Health   Treatment Modalities: Medication Management, Group therapy, Case management,  1 to 1 session with clinician, Psychoeducation, Recreational therapy.   Physician Treatment Plan for Primary Diagnosis: Bipolar 1 disorder (HCC) Long Term Goal(s):     Short Term Goals:    Medication Management: Evaluate patient's response, side effects, and tolerance of medication regimen.  Therapeutic Interventions: 1 to 1 sessions, Unit Group sessions and Medication administration.  Evaluation of Outcomes: Not Met  Physician Treatment Plan for Secondary Diagnosis: Principal Problem:   Bipolar 1 disorder (HCC)  Long Term Goal(s):     Short Term Goals:       Medication Management: Evaluate patient's response, side effects, and tolerance of medication regimen.  Therapeutic Interventions: 1 to 1 sessions, Unit Group sessions and  Medication administration.  Evaluation of Outcomes: Not Met   RN Treatment Plan for Primary Diagnosis: Bipolar 1 disorder (HCC) Long Term Goal(s): Knowledge of disease and therapeutic regimen to maintain health will improve  Short Term Goals: Ability to demonstrate self-control, Ability to participate in decision making will improve, Ability to verbalize feelings will improve, Ability to disclose and discuss suicidal ideas, Ability to identify and develop effective coping behaviors will improve, and Compliance with prescribed medications will improve  Medication Management: RN will administer medications as ordered  by provider, will assess and evaluate patient's response and provide education to patient for prescribed medication. RN will report any adverse and/or side effects to prescribing provider.  Therapeutic Interventions: 1 on 1 counseling sessions, Psychoeducation, Medication administration, Evaluate responses to treatment, Monitor vital signs and CBGs as ordered, Perform/monitor CIWA, COWS, AIMS and Fall Risk screenings as ordered, Perform wound care treatments as ordered.  Evaluation of Outcomes: Not Met   LCSW Treatment Plan for Primary Diagnosis: Bipolar 1 disorder (HCC) Long Term Goal(s): Safe transition to appropriate next level of care at discharge, Engage patient in therapeutic group addressing interpersonal concerns.  Short Term Goals: Engage patient in aftercare planning with referrals and resources, Increase social support, Increase ability to appropriately verbalize feelings, Increase emotional regulation, Facilitate acceptance of mental health diagnosis and concerns, and Increase skills for wellness and recovery  Therapeutic Interventions: Assess for all discharge needs, 1 to 1 time with Social worker, Explore available resources and support systems, Assess for adequacy in community support network, Educate family and significant other(s) on suicide prevention, Complete  Psychosocial Assessment, Interpersonal group therapy.  Evaluation of Outcomes: Not Met   Progress in Treatment: Attending groups: No. Participating in groups: No. Taking medication as prescribed: Yes. Toleration medication: Yes. Family/Significant other contact made: No, will contact:  once permission is given. Patient understands diagnosis: Yes. Discussing patient identified problems/goals with staff: Yes. Medical problems stabilized or resolved: Yes. Denies suicidal/homicidal ideation: Yes. Issues/concerns per patient self-inventory: No. Other: none  New problem(s) identified: No, Describe:  none  New Short Term/Long Term Goal(s): detox, elimination of symptoms of psychosis, medication management for mood stabilization; elimination of SI thoughts; development of comprehensive mental wellness/sobriety plan.   Patient Goals:  "I'm looking to leave the state"    Discharge Plan or Barriers: CSW to assist in the development of appropriate discharge plans.   Reason for Continuation of Hospitalization: Anxiety Delusions  Depression Hallucinations Medication stabilization Suicidal ideation  Estimated Length of Stay:  1-7 days  Last 3 [place] Suicide Severity Risk Score: Flowsheet Row Admission (Current) from 03/19/2023 in [facility] ED from 03/16/2023 in [facility]) [facility]  C-SSRS RISK CATEGORY No Risk No Risk No Risk       Last PHQ 2/9 Scores:    11/17/2022   12:26 AM  Depression screen PHQ 2/9  Decreased Interest 1  Down, Depressed, Hopeless 1  PHQ - 2 Score 2  Altered sleeping 1  Tired, decreased energy 1  Change in appetite 1  Feeling bad or failure about yourself  1  Trouble concentrating 1  Moving slowly or fidgety/restless 1  Suicidal thoughts 1  PHQ-9 Score 9  Difficult doing work/chores Somewhat difficult     Scribe for Treatment Team: Harden Mo, Alexander Mt 03/20/2023 10:37 AM

## 2023-03-21 DIAGNOSIS — F319 Bipolar disorder, unspecified: Secondary | ICD-10-CM

## 2023-03-21 LAB — LIPID PANEL
Cholesterol: 183 mg/dL (ref 0–200)
HDL: 83 mg/dL (ref >40)
LDL Cholesterol: 92 mg/dL (ref 0–99)
Total CHOL/HDL Ratio: 2.2 ratio
Triglycerides: 41 mg/dL (ref <150)
VLDL: 8 mg/dL (ref 0–40)

## 2023-03-21 LAB — HEMOGLOBIN A1C
Hgb A1c MFr Bld: 4.9 % (ref 4.8–5.6)
Mean Plasma Glucose: 93.93 mg/dL

## 2023-03-21 NOTE — Group Note (Signed)
Date:  03/21/2023 Time:  10:04 AM  Group Topic/Focus:  Goals Group:   The focus of this group is to help patients establish daily goals to achieve during treatment and discuss how the patient can incorporate goal setting into their daily lives to aide in recovery.    Participation Level:  Active  Participation Quality:  Appropriate  Affect:  Appropriate  Cognitive:  Appropriate  Insight: Appropriate  Engagement in Group:  Engaged  Modes of Intervention:  Discussion, Education, and Support  Additional Comments:    Wilford Corner 03/21/2023, 10:04 AM

## 2023-03-21 NOTE — [facility] Suicide Risk Assessment (Signed)
[facility] INPATIENT:  Family/Significant Other Suicide Prevention Education  Suicide Prevention Education:  Patient Refusal for Family/Significant Other Suicide Prevention Education: The patient Stacey [name] has refused to provide written consent for family/significant other to be provided Family/Significant Other Suicide Prevention Education during admission and/or prior to discharge.  Physician notified.  SPE completed with pt, as pt refused to consent to family contact. SPI pamphlet provided to pt and pt was encouraged to share information with support network, ask questions, and talk about any concerns relating to SPE. Pt denies access to guns/firearms and verbalized understanding of information provided. Mobile Crisis information also provided to pt.   Harden Mo 03/21/2023, 12:48 PM
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
## 2023-03-21 NOTE — Group Note (Signed)
[facility] LCSW Group Therapy Note   Group Date: 03/21/2023 Start Time: 1310 End Time: 1400   Type of Therapy/Topic:  Group Therapy:  Balance in Life  Participation Level:  Active   Description of Group:    This group will address the concept of balance and how it feels and looks when one is unbalanced. Patients will be encouraged to process areas in their lives that are out of balance, and identify reasons for remaining unbalanced. Facilitators will guide patients utilizing problem- solving interventions to address and correct the stressor making their life unbalanced. Understanding and applying boundaries will be explored and addressed for obtaining  and maintaining a balanced life. Patients will be encouraged to explore ways to assertively make their unbalanced needs known to significant others in their lives, using other group members and facilitator for support and feedback.  Therapeutic Goals: Patient will identify two or more emotions or situations they have that consume much of in their lives. Patient will identify signs/triggers that life has become out of balance:  Patient will identify two ways to set boundaries in order to achieve balance in their lives:  Patient will demonstrate ability to communicate their needs through discussion and/or role plays  Summary of Patient Progress: Pt was present for the entirety of the group process. She was actively involved in the discussion. Pt shared that not sleeping can cause her to become off balanced. She shared that she was give a steroid medication prior to admission which shifted her into a manic state. Pt appeared open and receptive to feedback/comments from both peers and facilitator. She presents with some insight into the topic.   Therapeutic Modalities:   Cognitive Behavioral Therapy Solution-Focused Therapy Assertiveness Training   Glenis Smoker, LCSW
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
## 2023-03-21 NOTE — Plan of Care (Signed)
Patient verbalized that she had a " good day " today. Patient appropriate with staff & peers. Denies SI,HI and AVH. Visible in the milieu. Attended groups. Appetite and energy level good. Support and encouragement given.

## 2023-03-21 NOTE — Group Note (Signed)
Recreation Therapy Group Note   Group Topic:Goal Setting  Group Date: 03/21/2023 Start Time: 1000 End Time: 1120 Facilitators: Rosina Lowenstein, LRT, CTRS Location:  Craft Room  Group Description: Vision Board. Patients were given many different magazines, a glue stick, markers, and a piece of cardstock paper. LRT and pts discussed the importance of having goals in life. LRT and pts discussed the difference between short-term and long-term goals, as well as what a SMART goal is. LRT encouraged pts to create a vision board, with images they picked and then cut out with safety scissors from the magazine, for themselves, that capture their short and long-term goals. LRT encouraged pts to show and explain their vision board to the group.   Goal Area(s) Addressed:  Patient will gain knowledge of short vs. long term goals.  Patient will identify goals for themselves. Patient will practice setting SMART goals. Patient will verbalize their goals to LRT and peers.   Affect/Mood: Appropriate   Participation Level: Active and Engaged   Participation Quality: Independent   Behavior: Appropriate, Calm, and Cooperative   Speech/Thought Process: Coherent   Insight: Good   Judgement: Good   Modes of Intervention: Art   Patient Response to Interventions:  Attentive, Engaged, Interested , and Receptive   Education Outcome:  Acknowledges education   Clinical Observations/Individualized Feedback: Kalijah was active in their participation of session activities and group discussion. Pt identified "I want to start a community garden and plant more butterfly vines" as her goal. Pt shared that she really enjoyed the activity and that she has always wanted to make a vision board. Pt interacted well with LRT and peers duration of session.   Plan: Continue to engage patient in RT group sessions 2-3x/week.   Rosina Lowenstein, LRT, CTRS 03/21/2023 12:08 PM

## 2023-03-21 NOTE — Progress Notes (Signed)
Patient slept most of early shift.  Woke up later for medication. Denies SI, HI, aVH. Medication compliant. Appropriate with staff and peers. No other complaints or concerns voiced.  Encouragement and support provided. Safety checks maintained. Medications given as prescribed. Pt receptive and remains safe on unit with q 15 min checks.

## 2023-03-21 NOTE — Group Note (Signed)
Date:  03/21/2023 Time:  11:19 PM  Group Topic/Focus:  Crisis Planning:   The purpose of this group is to help patients create a crisis plan for use upon discharge or in the future, as needed.    Participation Level:  Active  Participation Quality:  Appropriate  Affect:  Appropriate  Cognitive:  Appropriate  Insight: Appropriate  Engagement in Group:  Engaged  Modes of Intervention:  Discussion  Additional Comments:    Lenore Cordia 03/21/2023, 11:19 PM

## 2023-03-21 NOTE — Progress Notes (Signed)
Patient presents with bright affect. Request prn for pain, anxiety and sleep given with good relief. Denies SI, HI, AVH. Reports feeling a little better today. Noted in dayroom with peers. Encouragement and support provided. Safety checks maintained. Medications given as prescribed. Pt receptive and remains safe on unit with q 15 min checks.

## 2023-03-21 NOTE — Group Note (Signed)
Date:  03/21/2023 Time:  4:17 PM  Group Topic/Focus:  Activity Group:  The purpose of this group is to promote activity and also get some fresh air outside in the courtyard.    Participation Level:  Active  Participation Quality:  Appropriate  Affect:  Appropriate  Cognitive:  Appropriate  Insight: Appropriate  Engagement in Group:  Engaged  Modes of Intervention:  Activity  Additional Comments:    Stacey [name] 03/21/2023, 4:17 PM

## 2023-03-21 NOTE — Plan of Care (Signed)
  Problem: Coping: Goal: Ability to identify and develop effective coping behavior will improve Outcome: Progressing Goal: Ability to interact with others will improve Outcome: Progressing Goal: Ability to use eye contact when communicating with others will improve Outcome: Progressing   Problem: Self-Concept: Goal: Will verbalize positive feelings about self Outcome: Progressing

## 2023-03-21 NOTE — Plan of Care (Signed)
  Problem: Education: Goal: Emotional status will improve Outcome: Progressing Goal: Mental status will improve Outcome: Progressing Goal: Verbalization of understanding the information provided will improve Outcome: Progressing   Problem: Health Behavior/Discharge Planning: Goal: Compliance with treatment plan for underlying cause of condition will improve Outcome: Progressing   

## 2023-03-22 DIAGNOSIS — F3112 Bipolar disorder, current episode manic without psychotic features, moderate: Secondary | ICD-10-CM | POA: Diagnosis not present

## 2023-03-22 MED ORDER — SENNOSIDES-DOCUSATE SODIUM 8.6-50 MG PO TABS: 2.0000 | ORAL_TABLET | Freq: Two times a day (BID) | ORAL | Status: DC | PRN

## 2023-03-22 MED ORDER — HYDROXYZINE HCL 25 MG PO TABS
25.0000 mg | ORAL_TABLET | Freq: Every day | ORAL | Status: DC
  Administered 2023-03-22 – 2023-03-24 (×3): 25 mg via ORAL
  Filled 2023-03-22 (×3): qty 1

## 2023-03-22 NOTE — Group Note (Signed)
[facility] LCSW Group Therapy Note   Group Date: 03/22/2023 Start Time: 1315 End Time: 1415   Type of Therapy/Topic:  Group Therapy:  Emotion Regulation  Participation Level:  Active   Mood:  Description of Group:    The purpose of this group is to assist patients in learning to regulate negative emotions and experience positive emotions. Patients will be guided to discuss ways in which they have been vulnerable to their negative emotions. These vulnerabilities will be juxtaposed with experiences of positive emotions or situations, and patients challenged to use positive emotions to combat negative ones. Special emphasis will be placed on coping with negative emotions in conflict situations, and patients will process healthy conflict resolution skills.  Therapeutic Goals: Patient will identify two positive emotions or experiences to reflect on in order to balance out negative emotions:  Patient will label two or more emotions that they find the most difficult to experience:  Patient will be able to demonstrate positive conflict resolution skills through discussion or role plays:   Summary of Patient Progress: Patient was present in group. Patient was engaged and supportive of others. Patient reports that she journals for relaxation.  She reports that she thinks of emotional regulation as learning to express emotions appropriate.    Therapeutic Modalities:   Cognitive Behavioral Therapy Feelings Identification Dialectical Behavioral Therapy   Harden Mo, LCSW
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
## 2023-03-22 NOTE — Group Note (Signed)
Recreation Therapy Group Note   Group Topic:Leisure Education  Group Date: 03/22/2023 Start Time: 1000 End Time: 1105 Facilitators: Rosina Lowenstein, LRT, CTRS Location:  Craft Room  Group Description: Leisure. Patients were given the option to choose from coloring mandalas, singing karaoke, journaling, or playing cards. LRT and pts discussed the meaning of leisure, the importance of participating in leisure during their free time/when they're outside of the hospital, as well as how our leisure interests can also serve as coping skills. Pt identified two leisure interests and shared with the group.   Goal Area(s) Addressed:  Patient will identify a current leisure interest.  Patient will learn the definition of "leisure". Patient will practice making a positive decision. Patient will have the opportunity to try a new leisure activity. Patient will communicate with peers and LRT.    Affect/Mood: Appropriate and Flat   Participation Level: Active and Engaged   Participation Quality: Independent   Behavior: Calm and Cooperative   Speech/Thought Process: Coherent   Insight: Fair   Judgement: Good   Modes of Intervention: Activity   Patient Response to Interventions:  Receptive   Education Outcome:  Acknowledges education   Clinical Observations/Individualized Feedback: Stacey [name] was active in their participation of session activities and group discussion. Pt identified "garden and cook" as things she does in her free time. Pt chose to color with oil pastels while in group. Pt interacted well with LRT and peers duration of session.   Plan: Continue to engage patient in RT group sessions 2-3x/week.   Rosina Lowenstein, LRT, CTRS 03/22/2023 12:09 PM

## 2023-03-22 NOTE — Group Note (Signed)
Date:  03/22/2023 Time:  12:36 PM  Group Topic/Focus:  Goals Group:   The focus of this group is to help patients establish daily goals to achieve during treatment and discuss how the patient can incorporate goal setting into their daily lives to aide in recovery.    Participation Level:  Active  Participation Quality:  Appropriate  Affect:  Appropriate  Cognitive:  Appropriate  Insight: Appropriate  Engagement in Group:  Engaged  Modes of Intervention:  Discussion, Education, and Support  Additional Comments:    Wilford Corner 03/22/2023, 12:36 PM

## 2023-03-22 NOTE — Group Note (Signed)
Date:  03/22/2023 Time:  5:49 PM  Group Topic/Focus:  Rediscovering Joy:   The focus of this group is to explore various ways to relieve stress in a positive manner. Outdoor Recreation. Music Therapy. Structured Outdoor Activity.    Participation Level:  Active  Participation Quality:  Appropriate and Attentive  Affect:  Appropriate and Excited  Cognitive:  Alert, Appropriate, and Oriented  Insight: Appropriate  Engagement in Group:  Developing/Improving and Supportive  Modes of Intervention:  Activity, Discussion, Reality Testing, and Socialization  Additional Comments:    Rosaura Carpenter 03/22/2023, 5:49 PM

## 2023-03-22 NOTE — Progress Notes (Signed)
D- Patient alert and oriented. Affect/mood. Denies SI, HI, AVH Quotes. Goal. Has frequent c/o back pain. Has been isolated to herself most of the the day. A- Scheduled medications administered to patient, per MD orders. Support and encouragement provided.  Routine safety checks conducted every 15 minutes.  Patient informed to notify staff with problems or concerns. R- No adverse drug reactions noted. Patient contracts for safety at this time. Patient compliant with medications and treatment plan. Patient receptive, calm, and cooperative. Patient remains safe at this time.

## 2023-03-23 DIAGNOSIS — F3112 Bipolar disorder, current episode manic without psychotic features, moderate: Secondary | ICD-10-CM | POA: Diagnosis not present

## 2023-03-23 MED ORDER — RISPERIDONE 1 MG PO TABS
2.0000 mg | ORAL_TABLET | Freq: Every day | ORAL | Status: DC
  Administered 2023-03-23 – 2023-03-26 (×4): 2 mg via ORAL
  Filled 2023-03-23 (×4): qty 2

## 2023-03-23 NOTE — Group Note (Signed)
Date:  03/23/2023 Time:  12:04 PM  Group Topic/Focus:  Goals Group:   The focus of this group is to help patients establish daily goals to achieve during treatment and discuss how the patient can incorporate goal setting into their daily lives to aide in recovery.    Participation Level:  Active  Participation Quality:  Appropriate  Affect:  Appropriate  Cognitive:  Appropriate  Insight: Appropriate  Engagement in Group:  Engaged  Modes of Intervention:  Activity, Discussion, Education, and Support  Additional Comments:    Wilford Corner 03/23/2023, 12:04 PM

## 2023-03-23 NOTE — Group Note (Signed)
Date:  03/23/2023 Time:  10:24 PM  Group Topic/Focus:  Wrap-Up Group:   The focus of this group is to help patients review their daily goal of treatment and discuss progress on daily workbooks.    Participation Level:  Active  Participation Quality:  Appropriate  Affect:  Appropriate, Blunted, and Flat  Cognitive:  Alert and Oriented  Insight: Appropriate  Engagement in Group:  Engaged and Improving  Modes of Intervention:  Activity, Discussion, and Socialization  Additional Comments:     Katina Dung 03/23/2023, 10:24 PM

## 2023-03-23 NOTE — BHH Counselor (Signed)
The patient pulled the LCSWA aside to discuss her need for placement. The patient stated that she wanted resources for women group homes in the [place] or [place] areas.    Alesha Jaffee, MSW, LCSWA 03/23/23, 3:00 pm
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
## 2023-03-23 NOTE — Progress Notes (Signed)
[facility] MD Progress Note  03/23/2023 12:45 PM Stacey [name] Husch  MRN:  [id number]  Subjective:  Pt chart reviewed, discussed with interdisciplinary team, and seen on rounds. The client was outside stretching in the court yard prior to the evaluation.  Stacey [name] reported her sleep "was so much better" with the hydroxyzine.  Appetite is "good, food is good here."  Her depression is 4/10 with no suicidal ideations.  Anxiety is 7-8/10, "still pretty high" related to her concern for housing after discharge, desires to go to the [facility].  No side effects from her medications.  Principal Problem: Bipolar affective disorder, currently manic, moderate (HCC)  Diagnosis: Principal Problem:   Bipolar affective disorder, currently manic, moderate (HCC)  Total Time spent with patient:  25 minutes  Past Psychiatric History: mdd, ptsd  Past Medical History:  Past Medical History:  Diagnosis Date   Allergic rhinitis, cause unspecified    Anxiety    Anxiety    Asthma    Beta thalassemia minor Pt has a family hx. Pt has never been tested.    Bipolar 1 disorder (HCC)    Complication of anesthesia 2004   bp dropped after c section, took 24hrs to feels leg , numbing medicine does not last long when injected into skin   Diabetes mellitus without complication (HCC)    pre diabetic   Difficult intravenous access    hard stick for ivs and lab per pt   Family history of adverse reaction to anesthesia    mom heart stopped times 2 in or   Fibromyalgia    GERD (gastroesophageal reflux disease)    Headache    Iron deficiency anemia, unspecified 02/24/2013   Mental disorder    Morbid obesity (HCC)    Nasal bone fracture age 43   left side cannot place tube in nostril per pt   OSA (obstructive sleep apnea)    uses cpap does not know settings   Pernicious anemia 02/24/2013   Thyroid nodule     Past Surgical History:  Procedure Laterality Date   CESAREAN SECTION     COLONOSCOPY WITH  PROPOFOL N/A 01/02/2013   Procedure: COLONOSCOPY WITH PROPOFOL;  Surgeon: Louis Meckel, MD;  Location: [facility];  Service: Endoscopy;  Laterality: N/A;   ESOPHAGOGASTRODUODENOSCOPY N/A 01/02/2013   Procedure: ESOPHAGOGASTRODUODENOSCOPY (EGD);  Surgeon: Louis Meckel, MD;  Location: [facility];  Service: Endoscopy;  Laterality: N/A;   noraplant     TUBAL LIGATION     TUBAL LIGATION Bilateral    WISDOM TOOTH EXTRACTION     Family History:  Family History  Problem Relation Age of Onset   Breast cancer Mother 5   Asthma Mother    Bipolar disorder Mother    Parkinson's disease Mother    Diabetes Father    Breast cancer Maternal Aunt 28   Esophageal cancer Paternal Aunt        dx >50   Esophageal cancer Paternal Uncle        dx > 50   Breast cancer Maternal Grandmother 59   Emphysema Paternal Grandmother    Colon polyps Paternal Grandfather        unknown #   Family Psychiatric  History: Pt reports positive family psychiatric history. States "mother and all of her people have bipolar 1 and on father's side depression, anxiety, autism". Social History:  Social History   Substance and Sexual Activity  Alcohol Use Not Currently   Comment: socially  Social History   Substance and Sexual Activity  Drug Use No    Social History   Socioeconomic History   Marital status: Divorced    Spouse name: [name]   Number of children: 4   Years of education: Bachelors   Highest education level: Not on file  Occupational History   Occupation: Unemployed  Tobacco Use   Smoking status: Former    Current packs/day: 0.00    Average packs/day: 0.5 packs/day for 20.0 years (10.0 ttl pk-yrs)    Types: Cigarettes    Start date: 02/05/1987    Quit date: 07/30/2006    Years since quitting: 16.6   Smokeless tobacco: Never   Tobacco comments:    quit 7 years ago  Vaping Use   Vaping status: Every Day   Substances: Nicotine  Substance and Sexual Activity   Alcohol use: Not Currently     Comment: socially   Drug use: No   Sexual activity: Never    Birth control/protection: Surgical  Other Topics Concern   Not on file  Social History Narrative   Stacey [name] lives [place] 4 children 21, 15, 13, 10, Stacey [name] stays at home.   Patient has a Bachelor's degree.   Patient is right- handed.   Patient drinks one cup of coffee in the morning, sometimes 2 cups.         Social Determinants of Health   Financial Resource Strain: High Risk (05/18/2022)   Received from [facility], [facility]   Overall Financial Resource Strain (CARDIA)    Difficulty of Paying Living Expenses: Very hard  Food Insecurity: Food Insecurity Present (03/19/2023)   Hunger Vital Sign    Worried About Running Out of Food in the Last Year: Sometimes true    Ran Out of Food in the Last Year: Sometimes true  Transportation Needs: No Transportation Needs (03/19/2023)   PRAPARE - Administrator, Civil Service (Medical): No    Lack of Transportation (Non-Medical): No  Physical Activity: Inactive (05/18/2022)   Received from [facility], [facility]   Exercise Vital Sign    Days of Exercise per Week: 0 days    Minutes of Exercise per Session: 0 min  Stress: Stress Concern Present (05/18/2022)   Received from [facility], [facility] of Occupational Health - Occupational Stress Questionnaire    Feeling of Stress : Very much  Social Connections: Socially Isolated (05/18/2022)   Received from [facility], [facility]   Social Network    How would you rate your social network (family, work, friends)?: Little participation, lonely and socially isolated   Sleep: Good  Appetite:  Good  Current Medications: Current Facility-Administered Medications  Medication Dose Route Frequency Provider Last Rate Last Admin   acetaminophen (TYLENOL) tablet 650 mg  650 mg Oral Q6H PRN Motley-Mangrum, Jadeka A, PMHNP   650 mg at 03/23/23 1126   alum & mag  hydroxide-simeth (MAALOX/MYLANTA) 200-200-20 MG/5ML suspension 30 mL  30 mL Oral Q4H PRN Motley-Mangrum, Jadeka A, PMHNP       diphenhydrAMINE (BENADRYL) capsule 50 mg  50 mg Oral TID PRN Motley-Mangrum, Jadeka A, PMHNP       Or   diphenhydrAMINE (BENADRYL) injection 50 mg  50 mg Intramuscular TID PRN Motley-Mangrum, Jadeka A, PMHNP       DULoxetine (CYMBALTA) DR capsule 40 mg  40 mg Oral Daily Motley-Mangrum, Jadeka A, PMHNP   40 mg at 03/23/23 0842   gabapentin (NEURONTIN)  capsule 300 mg  300 mg Oral TID Motley-Mangrum, Jadeka A, PMHNP   300 mg at 03/23/23 1126   haloperidol (HALDOL) tablet 5 mg  5 mg Oral TID PRN Motley-Mangrum, Jadeka A, PMHNP       Or   haloperidol lactate (HALDOL) injection 5 mg  5 mg Intramuscular TID PRN Motley-Mangrum, Jadeka A, PMHNP       hydrOXYzine (ATARAX) tablet 25 mg  25 mg Oral TID PRN Motley-Mangrum, Jadeka A, PMHNP   25 mg at 03/23/23 0844   hydrOXYzine (ATARAX) tablet 25 mg  25 mg Oral QHS Charm Rings, NP   25 mg at 03/22/23 2137   ibuprofen (ADVIL) tablet 600 mg  600 mg Oral Q6H PRN Motley-Mangrum, Jadeka A, PMHNP   600 mg at 03/23/23 0844   lamoTRIgine (LAMICTAL) tablet 200 mg  200 mg Oral BID Motley-Mangrum, Jadeka A, PMHNP   200 mg at 03/23/23 0842   lidocaine (LIDODERM) 5 % 1 patch  1 patch Transdermal Q24H Motley-Mangrum, Jadeka A, PMHNP   1 patch at 03/23/23 0635   LORazepam (ATIVAN) tablet 2 mg  2 mg Oral TID PRN Motley-Mangrum, Geralynn Ochs A, PMHNP       Or   LORazepam (ATIVAN) injection 2 mg  2 mg Intramuscular TID PRN Motley-Mangrum, Jadeka A, PMHNP       magnesium hydroxide (MILK OF MAGNESIA) suspension 30 mL  30 mL Oral Daily PRN Motley-Mangrum, Jadeka A, PMHNP   30 mL at 03/23/23 0844   nicotine polacrilex (NICORETTE) gum 2 mg  2 mg Oral PRN Lauree Chandler, NP   2 mg at 03/23/23 1126   risperiDONE (RISPERDAL) tablet 1.5 mg  1.5 mg Oral QHS Lauree Chandler, NP   1.5 mg at 03/22/23 2137   senna-docusate (Senokot-S) tablet 2 tablet  2 tablet  Oral BID PRN Charm Rings, NP       traZODone (DESYREL) tablet 150 mg  150 mg Oral QHS Motley-Mangrum, Jadeka A, PMHNP   150 mg at 03/22/23 2136   Facility-Administered Medications Ordered in Other Encounters  Medication Dose Route Frequency Provider Last Rate Last Admin   fentaNYL (SUBLIMAZE) injection 25 mcg  25 mcg Intravenous Q5 min PRN Yves Dill, MD       ondansetron [facility]) injection 4 mg  4 mg Intravenous Once PRN Yves Dill, MD        Lab Results:  No results found for this or any previous visit (from the past 48 hour(s)).   Blood Alcohol level:  Lab Results  Component Value Date   ETH <10 03/17/2023   ETH <10 11/16/2022    Metabolic Disorder Labs: Lab Results  Component Value Date   HGBA1C 4.9 03/21/2023   MPG 93.93 03/21/2023   MPG 111 01/10/2015   No results found for: "PROLACTIN" Lab Results  Component Value Date   CHOL 183 03/21/2023   TRIG 41 03/21/2023   HDL 83 03/21/2023   CHOLHDL 2.2 03/21/2023   VLDL 8 03/21/2023   LDLCALC 92 03/21/2023   LDLCALC 98 11/16/2022    Physical Findings: AIMS:  , ,  ,  ,    CIWA:    COWS:     Musculoskeletal: Strength & Muscle Tone: within normal limits Gait & Station: normal Patient leans: N/A  Psychiatric Specialty Exam: Physical Exam Constitutional:      General: Stacey [name] is not in acute distress.    Appearance: Stacey [name] is not ill-appearing, toxic-appearing or diaphoretic.  HENT:     Nose:  Nose normal.  Eyes:     General: No scleral icterus. Pulmonary:     Effort: Pulmonary effort is normal. No respiratory distress.  Musculoskeletal:        General: Normal range of motion.     Cervical back: Normal range of motion.  Neurological:     General: No focal deficit present.     Mental Status: Stacey [name] is alert and oriented to person, place, and time.  Psychiatric:        Attention and Perception: Attention and perception normal.        Mood and Affect: Mood and affect normal.        Speech: Speech normal.         Behavior: Behavior normal. Behavior is cooperative.        Thought Content: Thought content is paranoid.        Cognition and Memory: Cognition and memory normal.     Review of Systems  Constitutional:  Negative for chills and fever.  Respiratory:  Negative for shortness of breath.   Cardiovascular:  Negative for chest pain and palpitations.  Gastrointestinal:  Negative for abdominal pain.  Psychiatric/Behavioral:  Positive for depression. The patient is nervous/anxious and has insomnia.   All other systems reviewed and are negative.   Blood pressure 139/68, pulse 76, temperature 97.8 F (36.6 C), temperature source Oral, resp. rate 18, height 5\' 8"  (1.727 m), weight 111.6 kg, last menstrual period 09/21/2015, SpO2 100%.Body mass index is 37.4 kg/m.  General Appearance: Casual  Eye Contact:  Good  Speech:  Normal Rate  Volume:  Normal  Mood:  Anxious and Depressed  Affect:  Congruent  Thought Process:  Coherent  Orientation:  Full (Time, Place, and Person)  Thought Content:  Delusions  Suicidal Thoughts:  No  Homicidal Thoughts:  No  Memory:  Immediate;   Fair Recent;   Fair Remote;   Fair  Judgement:  Fair  Insight:  Fair  Psychomotor Activity:  Normal  Concentration:  Concentration: Fair and Attention Span: Fair  Recall:  [employer] of Knowledge:  Good  Language:  Good  Akathisia:  No  Handed:  Right  AIMS (if indicated):     Assets:  Leisure Time Physical Health Resilience Social Support  ADL's:  Intact  Cognition:  WNL  Sleep:        Physical Exam: Physical Exam Constitutional:      General: Stacey [name] is not in acute distress.    Appearance: Stacey [name] is not ill-appearing, toxic-appearing or diaphoretic.  HENT:     Nose: Nose normal.  Eyes:     General: No scleral icterus. Pulmonary:     Effort: Pulmonary effort is normal. No respiratory distress.  Musculoskeletal:        General: Normal range of motion.     Cervical back: Normal range of motion.   Neurological:     General: No focal deficit present.     Mental Status: Stacey [name] is alert and oriented to person, place, and time.  Psychiatric:        Attention and Perception: Attention and perception normal.        Mood and Affect: Mood and affect normal.        Speech: Speech normal.        Behavior: Behavior normal. Behavior is cooperative.        Thought Content: Thought content is paranoid.        Cognition and Memory: Cognition and memory normal.  Review of Systems  Constitutional:  Negative for chills and fever.  Respiratory:  Negative for shortness of breath.   Cardiovascular:  Negative for chest pain and palpitations.  Gastrointestinal:  Negative for abdominal pain.  Psychiatric/Behavioral:  Positive for depression. The patient is nervous/anxious and has insomnia.   All other systems reviewed and are negative.  Blood pressure 139/68, pulse 76, temperature 97.8 F (36.6 C), temperature source Oral, resp. rate 18, height 5\' 8"  (1.727 m), weight 111.6 kg, last menstrual period 09/21/2015, SpO2 100%. Body mass index is 37.4 kg/m.   Treatment Plan Summary: Daily contact with patient to assess and evaluate symptoms and progress in treatment, Medication management, and Plan    Bipolar 1 disorder -Lamictal 200mg  oral 2 times daily    Paranoia -Increase rRsperdal 1.5mg  oral daily at bedtime to 2 mg    Fibromyalgia, Chronic back and neck pain -Cymbalta 40mg  oral daily -Gabapentin 300mg  oral 3 times daily -Lidoderm 5% 1 patch transdermal administer over 12 hours every 24 hours    Smoking cessation -Nicorette 2mg  oral oral as needed smoking cessation   Insomnia -Trazodone 150mg  oral daily at bedtime   Anxiety PRN -Atarax 25mg  oral 3 times daily PRN anxiety, 25 mg scheduled at bedtime per her request   Agitation PRNs -Benadryl 50mg  oral or IM 3 times daily PRN agitation -Haldol 5mg  oral or IM 3 times daily PRN agitation -Ativan 2mg  oral or IM 3 times daily PRN  agitation   PRNs -Tylenol 650mg  oral every 6 hours PRN mild pain -Maalox/Mylanta 30mL oral every 4 hours PRN indigestion -Senokot ordered per her request, 2 tablets BID PRN constipation -Milk of Magnesia 30mL oral daily PRN mild constipation   Lipid panel Total CHOL/HDL ratio 2.2 Cholesterol 213 HDL Cholesterol 83 LDL (calc) 92 Triglycerides 41 VLDL 8   A1c  4.9  EKG Qtc 086  Nanine Means, NP 03/23/2023, 12:45 PM

## 2023-03-23 NOTE — Progress Notes (Signed)
Patient endorses depression, she has chronic pain , she was medicated on shift as prescribed. She endorses depression and anxiety. She was cooperative with treatment on the shift. No new behavioral issues to report on shift through out the night.

## 2023-03-23 NOTE — Plan of Care (Signed)
  Problem: Education: Goal: Knowledge of [facility] Education information/materials will improve Outcome: Progressing Goal: Emotional status will improve Outcome: Progressing Goal: Mental status will improve Outcome: Progressing Goal: Verbalization of understanding the information provided will improve Outcome: Progressing   Problem: Health Behavior/Discharge Planning: Goal: Identification of resources available to assist in meeting health care needs will improve Outcome: Progressing Goal: Compliance with treatment plan for underlying cause of condition will improve Outcome: Progressing   Problem: Activity: Goal: Will identify at least one activity in which they can participate Outcome: Progressing   Problem: Coping: Goal: Ability to identify and develop effective coping behavior will improve Outcome: Progressing Goal: Ability to interact with others will improve Outcome: Progressing Goal: Demonstration of participation in decision-making regarding own care will improve Outcome: Progressing Goal: Ability to use eye contact when communicating with others will improve Outcome: Progressing   Problem: Self-Concept: Goal: Will verbalize positive feelings about self Outcome: Progressing   

## 2023-03-23 NOTE — Group Note (Signed)
Date:  03/23/2023 Time:  5:49 PM  Group Topic/Focus:  Wellness Toolbox:   The focus of this group is to discuss various aspects of wellness, balancing those aspects and exploring ways to increase the ability to experience wellness.  Patients will create a wellness toolbox for use upon discharge.    Participation Level:  Did Not Attend   Lynelle Smoke [facility] 03/23/2023, 5:49 PM

## 2023-03-23 NOTE — Group Note (Signed)
Date:  03/23/2023 Time:  4:07 AM  Group Topic/Focus:  Wrap-Up Group:   The focus of this group is to help patients review their daily goal of treatment and discuss progress on daily workbooks.    Participation Level:  Active  Participation Quality:  Appropriate  Affect:  Appropriate  Cognitive:  Alert and Appropriate  Insight: Appropriate  Engagement in Group:  Limited  Modes of Intervention:  Activity  Additional Comments:     Maglione,Fraidy Mccarrick E 03/23/2023, 4:07 AM

## 2023-03-23 NOTE — Plan of Care (Signed)
  Problem: Education: Goal: Knowledge of [facility] Education information/materials will improve 03/23/2023 0723 by Elbert Ewings, RN Outcome: Progressing 03/23/2023 0722 by Elbert Ewings, RN Outcome: Progressing Goal: Emotional status will improve 03/23/2023 0723 by Elbert Ewings, RN Outcome: Progressing 03/23/2023 0722 by Elbert Ewings, RN Outcome: Progressing Goal: Mental status will improve 03/23/2023 0723 by Elbert Ewings, RN Outcome: Progressing 03/23/2023 0722 by Elbert Ewings, RN Outcome: Progressing Goal: Verbalization of understanding the information provided will improve 03/23/2023 0723 by Elbert Ewings, RN Outcome: Progressing 03/23/2023 0722 by Elbert Ewings, RN Outcome: Progressing   Problem: Health Behavior/Discharge Planning: Goal: Identification of resources available to assist in meeting health care needs will improve 03/23/2023 0723 by Elbert Ewings, RN Outcome: Progressing 03/23/2023 0722 by Elbert Ewings, RN Outcome: Progressing Goal: Compliance with treatment plan for underlying cause of condition will improve 03/23/2023 0723 by Elbert Ewings, RN Outcome: Progressing 03/23/2023 0722 by Elbert Ewings, RN Outcome: Progressing   Problem: Activity: Goal: Will identify at least one activity in which they can participate 03/23/2023 0723 by Elbert Ewings, RN Outcome: Progressing 03/23/2023 0722 by Elbert Ewings, RN Outcome: Progressing   Problem: Coping: Goal: Ability to identify and develop effective coping behavior will improve 03/23/2023 0723 by Elbert Ewings, RN Outcome: Progressing 03/23/2023 0722 by Elbert Ewings, RN Outcome: Progressing Goal: Ability to interact with others will improve 03/23/2023 0723 by Elbert Ewings, RN Outcome: Progressing 03/23/2023 0722 by Elbert Ewings, RN Outcome: Progressing Goal: Demonstration of participation in decision-making regarding own care will improve 03/23/2023 0723 by Elbert Ewings, RN Outcome: Progressing 03/23/2023 0722 by Elbert Ewings, RN Outcome: Progressing Goal: Ability to use eye contact when communicating with others will improve 03/23/2023 0723 by Elbert Ewings, RN Outcome: Progressing 03/23/2023 0722 by Elbert Ewings, RN Outcome: Progressing   Problem: Self-Concept: Goal: Will verbalize positive feelings about self 03/23/2023 0723 by Elbert Ewings, RN Outcome: Progressing 03/23/2023 0722 by Elbert Ewings, RN Outcome: Progressing

## 2023-03-24 DIAGNOSIS — F3112 Bipolar disorder, current episode manic without psychotic features, moderate: Secondary | ICD-10-CM | POA: Diagnosis not present

## 2023-03-24 MED ORDER — LIDOCAINE 5 % EX PTCH
2.0000 | MEDICATED_PATCH | CUTANEOUS | Status: DC
  Administered 2023-03-25 – 2023-03-27 (×3): 2 via TRANSDERMAL
  Filled 2023-03-24 (×3): qty 2
  Filled 2023-03-24: qty 1
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
## 2023-03-24 NOTE — Progress Notes (Addendum)
Nursing Shift Note:  1900-0700  Attended Evening Group: yes Medication Compliant: yes  Behavior: cooperative and pleasant  Sleep Quality: improved Significant Changes: none  The patient was social and pleasant.  Risperdal started at nighttime.  No significant issues or changes to note.
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
## 2023-03-24 NOTE — Group Note (Signed)
Date:  03/24/2023 Time:  7:22 PM  Group Topic/Focus:  Activity Group    Participation Level:  Active  Participation Quality:  Appropriate  Affect:  Appropriate  Cognitive:  Appropriate  Insight: Appropriate  Engagement in Group:  Engaged  Modes of Intervention:  Activity  Additional Comments:    Abdoulaye Drum A Shevon Sian 03/24/2023, 7:22 PM

## 2023-03-24 NOTE — Plan of Care (Signed)
  Problem: Education: Goal: Knowledge of [facility] Education information/materials will improve Outcome: Not Progressing Goal: Emotional status will improve Outcome: Not Progressing Goal: Mental status will improve Outcome: Not Progressing Goal: Verbalization of understanding the information provided will improve Outcome: Not Progressing   Problem: Health Behavior/Discharge Planning: Goal: Identification of resources available to assist in meeting health care needs will improve Outcome: Not Progressing Goal: Compliance with treatment plan for underlying cause of condition will improve Outcome: Not Progressing   Problem: Activity: Goal: Will identify at least one activity in which they can participate Outcome: Not Progressing   Problem: Coping: Goal: Ability to identify and develop effective coping behavior will improve Outcome: Not Progressing Goal: Ability to interact with others will improve Outcome: Not Progressing Goal: Demonstration of participation in decision-making regarding own care will improve Outcome: Not Progressing Goal: Ability to use eye contact when communicating with others will improve Outcome: Not Progressing   Problem: Self-Concept: Goal: Will verbalize positive feelings about self Outcome: Not Progressing   

## 2023-03-24 NOTE — Progress Notes (Signed)
Nursing Shift Note:  1900-0700  Attended Evening Group: yes Medication Compliant: yes  Behavior: social and cooperative Sleep Quality: good Significant Changes: none  The patient was cooperative and pleasant.  She continues to requests frequent PRN medications for a variety of somatic concerns.  Overall stable mood with appropriate behavior.
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
## 2023-03-25 DIAGNOSIS — F3112 Bipolar disorder, current episode manic without psychotic features, moderate: Secondary | ICD-10-CM | POA: Diagnosis not present

## 2023-03-25 MED ORDER — DULOXETINE HCL 30 MG PO CPEP
60.0000 mg | ORAL_CAPSULE | Freq: Every day | ORAL | Status: DC
  Administered 2023-03-26 – 2023-03-27 (×2): 60 mg via ORAL
  Filled 2023-03-25 (×2): qty 2

## 2023-03-25 MED ORDER — HYDROXYZINE HCL 25 MG PO TABS: 25.0000 mg | ORAL_TABLET | Freq: Every evening | ORAL | Status: DC | PRN

## 2023-03-25 NOTE — Group Note (Signed)
Date:  03/25/2023 Time:  12:20 PM  Group Topic/Focus:  Healthy Communication:   The focus of this group is to discuss communication, barriers to communication, as well as healthy ways to communicate with others.    Participation Level:  Did Not Attend   Stacey [name] 03/25/2023, 12:20 PM

## 2023-03-25 NOTE — Progress Notes (Signed)
   03/25/23 0906  Psych Admission Type (Psych Patients Only)  Admission Status Involuntary  Psychosocial Assessment  Patient Complaints Anxiety  Eye Contact Fair  Facial Expression Animated  Affect Appropriate to circumstance  Speech Logical/coherent  Interaction Assertive  Motor Activity Slow  Appearance/Hygiene Unremarkable  Behavior Characteristics Cooperative  Mood Pleasant  Thought Process  Coherency WDL  Content WDL  Delusions None reported or observed  Perception WDL  Hallucination None reported or observed  Judgment Impaired  Confusion WDL  Danger to Self  Current suicidal ideation? Denies  Agreement Not to Harm Self Yes  Description of Agreement Verbal  Danger to Others  Danger to Others None reported or observed

## 2023-03-25 NOTE — Group Note (Signed)
Date:  03/25/2023 Time:  6:00 PM  Group Topic/Focus:  Art Therapy It is particularly useful for those who are struggling with depression, anxiety, trauma, or other related mental health issues. Individuals who need help expressing their emotions or exploring unresolved issues may also benefit from art therapy.    Participation Level:  Minimal  Participation Quality:  Appropriate  Affect:  Appropriate  Cognitive:  Alert, Appropriate, and Oriented  Insight: Appropriate  Engagement in Group:  Engaged  Modes of Intervention:  Activity, Exploration, and Socialization  Additional Comments:    Stacey [name] 03/25/2023, 6:00 PM
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
## 2023-03-25 NOTE — [facility] Counselor (Signed)
CSW provided patient with number for [facility] for her to call for possible bed availability.  Penni Homans, MSW, LCSW 03/25/2023 3:02 PM

## 2023-03-25 NOTE — Group Note (Signed)
Date:  03/25/2023 Time:  9:29 PM  Group Topic/Focus:  Wrap-Up Group:   The focus of this group is to help patients review their daily goal of treatment and discuss progress on daily workbooks.    Participation Level:  Active  Participation Quality:  Appropriate  Affect:  Appropriate  Cognitive:  Appropriate  Insight: Appropriate  Engagement in Group:  Engaged  Modes of Intervention:  Discussion  Additional Comments:   Lenore Cordia 03/25/2023, 9:29 PM

## 2023-03-25 NOTE — Plan of Care (Signed)
  Problem: Education: Goal: Knowledge of [place] [facility] Education information/materials will improve Outcome: Progressing   Problem: Coping: Goal: Ability to interact with others will improve Outcome: Progressing

## 2023-03-26 DIAGNOSIS — F3112 Bipolar disorder, current episode manic without psychotic features, moderate: Secondary | ICD-10-CM | POA: Diagnosis not present

## 2023-03-26 NOTE — Progress Notes (Signed)
Patient pleasant and cooperative. Denies SI, HI, AVH. Endorses anxiety and depression. Complaints of pain to lower back, prn given with good relief. Visible in milieu, appropriate with staff and peers.  Encouragement and support provided. Safety checks maintained. Medications given as prescribed. Pt receptive and remains safe on unit with q 15 min checks.

## 2023-03-26 NOTE — Progress Notes (Signed)
[facility] MD Progress Note  03/26/2023 12:11 PM Stacey [name] Husch  MRN:  [id number]  Subjective:  Pt chart reviewed, discussed with interdisciplinary team, and seen on rounds. Reports "I feel like I'm doing really well". States she has found a place to stay. She will be staying with a friend from her agnostic secular group. States her friend has ALS and she will be helping friend around the house. She denies suicidal, homicidal ideations. She denies auditory visual hallucinations or paranoia.  We called pt's friend, Cleon Dew, [phone], together. Delice Bison confirmed pt will be staying with her. She states she can be support for pt.   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 Pt has never been tested.    Bipolar 1 disorder (HCC)    Complication of anesthesia 2004   bp dropped after c section, took 24hrs to feels leg , numbing medicine does not last long when injected into skin   Diabetes mellitus without complication (HCC)    pre diabetic   Difficult intravenous access    hard stick for ivs and lab per pt   Family history of adverse reaction to anesthesia    mom heart stopped times 2 in or   Fibromyalgia    GERD (gastroesophageal reflux disease)    Headache    Iron deficiency anemia, unspecified 02/24/2013   Mental disorder    Morbid obesity (HCC)    Nasal bone fracture age 21   left side cannot place tube in nostril per pt   OSA (obstructive sleep apnea)    uses cpap does not know settings   Pernicious anemia 02/24/2013   Thyroid nodule     Past Surgical History:  Procedure Laterality  Date   CESAREAN SECTION     COLONOSCOPY WITH PROPOFOL N/A 01/02/2013   Procedure: COLONOSCOPY WITH PROPOFOL;  Surgeon: Louis Meckel, MD;  Location: [facility];  Service: Endoscopy;  Laterality: N/A;   ESOPHAGOGASTRODUODENOSCOPY N/A 01/02/2013   Procedure: ESOPHAGOGASTRODUODENOSCOPY (EGD);  Surgeon: Louis Meckel, MD;  Location: [facility];  Service: Endoscopy;  Laterality: N/A;   noraplant     TUBAL LIGATION     TUBAL LIGATION Bilateral    WISDOM TOOTH EXTRACTION     Family History:  Family History  Problem Relation Age of Onset   Breast cancer Mother 39   Asthma Mother    Bipolar disorder Mother    Parkinson's disease Mother    Diabetes Father    Breast cancer Maternal Aunt 3   Esophageal cancer Paternal Aunt        dx >50   Esophageal cancer Paternal Uncle        dx > 50   Breast cancer Maternal Grandmother 3   Emphysema Paternal Grandmother    Colon polyps Paternal Grandfather        unknown #   Family Psychiatric  History: Pt reports positive family psychiatric history. States "mother and all of her people have bipolar 1 and on father's side depression, anxiety, autism".  Social History:  Social History   Substance and Sexual Activity  Alcohol Use Not Currently  Comment: socially     Social History   Substance and Sexual Activity  Drug Use No    Social History   Socioeconomic History   Marital status: Divorced    Spouse name: [name]   Number of children: 4   Years of education: Bachelors   Highest education level: Not on file  Occupational History   Occupation: Unemployed  Tobacco Use   Smoking status: Former    Current packs/day: 0.00    Average packs/day: 0.5 packs/day for 20.0 years (10.0 ttl pk-yrs)    Types: Cigarettes    Start date: 02/05/1987    Quit date: 07/30/2006    Years since quitting: 16.6   Smokeless tobacco: Never   Tobacco comments:    quit 7 years ago  Vaping Use   Vaping status: Every Day   Substances: Nicotine  Substance and  Sexual Activity   Alcohol use: Not Currently    Comment: socially   Drug use: No   Sexual activity: Never    Birth control/protection: Surgical  Other Topics Concern   Not on file  Social History Narrative   She lives [place] her [place] 4 children 21, 15, 13, 10, she stays at home.   Patient has a Bachelor's degree.   Patient is right- handed.   Patient drinks one cup of coffee in the morning, sometimes 2 cups.         Social Determinants of Health   Financial Resource Strain: High Risk (05/18/2022)   Received from [facility], [facility]   Overall Financial Resource Strain (CARDIA)    Difficulty of Paying Living Expenses: Very hard  Food Insecurity: Food Insecurity Present (03/19/2023)   Hunger Vital Sign    Worried About Running Out of Food in the Last Year: Sometimes true    Ran Out of Food in the Last Year: Sometimes true  Transportation Needs: No Transportation Needs (03/19/2023)   PRAPARE - Administrator, Civil Service (Medical): No    Lack of Transportation (Non-Medical): No  Physical Activity: Inactive (05/18/2022)   Received from [facility], [facility]   Exercise Vital Sign    Days of Exercise per Week: 0 days    Minutes of Exercise per Session: 0 min  Stress: Stress Concern Present (05/18/2022)   Received from [facility], [facility] of Occupational Health - Occupational Stress Questionnaire    Feeling of Stress : Very much  Social Connections: Socially Isolated (05/18/2022)   Received from [facility], [facility]   Social Network    How would you rate your social network (family, work, friends)?: Little participation, lonely and socially isolated   Sleep: Fair  Appetite:  Fair  Current Medications: Current Facility-Administered Medications  Medication Dose Route Frequency Provider Last Rate Last Admin   acetaminophen (TYLENOL) tablet 650 mg  650 mg Oral Q6H PRN Motley-Mangrum, Jadeka A, PMHNP   650  mg at 03/24/23 2152   alum & mag hydroxide-simeth (MAALOX/MYLANTA) 200-200-20 MG/5ML suspension 30 mL  30 mL Oral Q4H PRN Motley-Mangrum, Jadeka A, PMHNP       diphenhydrAMINE (BENADRYL) capsule 50 mg  50 mg Oral TID PRN Motley-Mangrum, Jadeka A, PMHNP       Or   diphenhydrAMINE (BENADRYL) injection 50 mg  50 mg Intramuscular TID PRN Motley-Mangrum, Jadeka A, PMHNP       DULoxetine (CYMBALTA) DR capsule 60 mg  60 mg Oral Daily Lauree Chandler, NP   60 mg at  03/26/23 0900   gabapentin (NEURONTIN) capsule 300 mg  300 mg Oral TID Motley-Mangrum, Jadeka A, PMHNP   300 mg at 03/26/23 1113   haloperidol (HALDOL) tablet 5 mg  5 mg Oral TID PRN Motley-Mangrum, Jadeka A, PMHNP       Or   haloperidol lactate (HALDOL) injection 5 mg  5 mg Intramuscular TID PRN Motley-Mangrum, Jadeka A, PMHNP       hydrOXYzine (ATARAX) tablet 25 mg  25 mg Oral TID PRN Motley-Mangrum, Jadeka A, PMHNP   25 mg at 03/25/23 2102   ibuprofen (ADVIL) tablet 600 mg  600 mg Oral Q6H PRN Motley-Mangrum, Jadeka A, PMHNP   600 mg at 03/26/23 1114   lamoTRIgine (LAMICTAL) tablet 200 mg  200 mg Oral BID Motley-Mangrum, Jadeka A, PMHNP   200 mg at 03/26/23 0900   lidocaine (LIDODERM) 5 % 2 patch  2 patch Transdermal Q24H Charm Rings, NP   2 patch at 03/26/23 0859   magnesium hydroxide (MILK OF MAGNESIA) suspension 30 mL  30 mL Oral Daily PRN Motley-Mangrum, Jadeka A, PMHNP   30 mL at 03/23/23 2131   nicotine polacrilex (NICORETTE) gum 2 mg  2 mg Oral PRN Lauree Chandler, NP   2 mg at 03/26/23 1114   risperiDONE (RISPERDAL) tablet 2 mg  2 mg Oral QHS Charm Rings, NP   2 mg at 03/25/23 2059   senna-docusate (Senokot-S) tablet 2 tablet  2 tablet Oral BID PRN Charm Rings, NP       traZODone (DESYREL) tablet 150 mg  150 mg Oral QHS Motley-Mangrum, Jadeka A, PMHNP   150 mg at 03/25/23 2059   Facility-Administered Medications Ordered in Other Encounters  Medication Dose Route Frequency Provider Last Rate Last Admin    fentaNYL (SUBLIMAZE) injection 25 mcg  25 mcg Intravenous Q5 min PRN Yves Dill, MD       ondansetron [facility]) injection 4 mg  4 mg Intravenous Once PRN Yves Dill, MD        Lab Results: No results found for this or any previous visit (from the past 48 hour(s)).  Blood Alcohol level:  Lab Results  Component Value Date   ETH <10 03/17/2023   ETH <10 11/16/2022    Metabolic Disorder Labs: Lab Results  Component Value Date   HGBA1C 4.9 03/21/2023   MPG 93.93 03/21/2023   MPG 111 01/10/2015   No results found for: "PROLACTIN" Lab Results  Component Value Date   CHOL 183 03/21/2023   TRIG 41 03/21/2023   HDL 83 03/21/2023   CHOLHDL 2.2 03/21/2023   VLDL 8 03/21/2023   LDLCALC 92 03/21/2023   LDLCALC 98 11/16/2022    Physical Findings: AIMS:  , ,  ,  ,    CIWA:    COWS:     Musculoskeletal: Strength & Muscle Tone: within normal limits Gait & Station: normal Patient leans: N/A  Psychiatric Specialty Exam:  Presentation  General Appearance:  Appropriate for Environment; Fairly Groomed  Eye Contact: Fair  Speech: Clear and Coherent; Normal Rate  Speech Volume: Normal  Handedness: Right   Mood and Affect  Mood: -- ("I feel like I'm doing really well")  Affect: Appropriate; Full Range   Thought Process  Thought Processes: Coherent; Goal Directed; Linear  Descriptions of Associations:Intact  Orientation:Full (Time, Place and Person)  Thought Content:Logical  History of Schizophrenia/Schizoaffective disorder:No  Duration of Psychotic Symptoms:N/A  Hallucinations:Hallucinations: None  Ideas of Reference:None  Suicidal Thoughts:Suicidal Thoughts: No  Homicidal  Thoughts:Homicidal Thoughts: No   Sensorium  Memory: Immediate Fair  Judgment: Fair  Insight: Fair   Chartered certified accountant: Fair  Attention Span: Fair  Recall: [employer] of Knowledge: Fair  Language: Fair   Psychomotor Activity   Psychomotor Activity: Psychomotor Activity: Normal   Assets  Assets: Communication Skills; Desire for Improvement; Financial Resources/Insurance; Leisure Time; Resilience; Housing; Social Support   Sleep  Sleep: Sleep: Good    Physical Exam: Physical Exam Constitutional:      General: She is not in acute distress.    Appearance: She is not ill-appearing, toxic-appearing or diaphoretic.  Eyes:     General: No scleral icterus. Cardiovascular:     Rate and Rhythm: Normal rate.  Pulmonary:     Effort: Pulmonary effort is normal. No respiratory distress.  Neurological:     Mental Status: She is alert and oriented to person, place, and time.  Psychiatric:        Attention and Perception: Attention and perception normal.        Mood and Affect: Mood and affect normal.        Speech: Speech normal.        Behavior: Behavior normal. Behavior is cooperative.        Thought Content: Thought content normal.        Cognition and Memory: Cognition and memory normal.        Judgment: Judgment normal.    Review of Systems  Constitutional:  Negative for chills and fever.  Respiratory:  Negative for shortness of breath.   Cardiovascular:  Negative for chest pain and palpitations.  Gastrointestinal:  Negative for abdominal pain.  Neurological:  Negative for headaches.  Psychiatric/Behavioral: Negative.     Blood pressure 125/73, pulse 95, temperature 98.6 F (37 C), temperature source Oral, resp. rate (!) 21, height 5\' 8"  (1.727 m), weight 111.6 kg, last menstrual period 09/21/2015, SpO2 98%. Body mass index is 37.4 kg/m.  Treatment Plan Summary: Daily contact with patient to assess and evaluate symptoms and progress in treatment, Medication management, and Plan    Bipolar 1 disorder -Lamictal 200mg  oral 2 times daily    Paranoia -Risperdal 2 mg oral daily at bedtime    Fibromyalgia, Chronic back and neck pain -Increased Cymbalta from 40mg  to 60mg  oral daily -Gabapentin  300mg  oral 3 times daily -Lidoderm 5% 1 patch transdermal administer over 12 hours every 24 hours increased to 2   Smoking cessation -Nicorette 2mg  oral oral as needed smoking cessation   Insomnia -Trazodone 150mg  oral daily at bedtime   Anxiety PRN -Atarax 25mg  oral 3 times daily PRN anxiety,  -Discontinued atarax 25 mg scheduled at bedtime    Agitation PRNs -Benadryl 50mg  oral or IM 3 times daily PRN agitation -Haldol 5mg  oral or IM 3 times daily PRN agitation -Discontinued Ativan 2mg  oral or IM 3 times daily PRN agitation   PRNs -Tylenol 650mg  oral every 6 hours PRN mild pain -Maalox/Mylanta 30mL oral every 4 hours PRN indigestion -Senokot ordered per her request, 2 tablets BID PRN constipation -Milk of Magnesia 30mL oral daily PRN mild constipation -Advil 600mg  oral every 6 hours PRN moderate pain   Lipid panel Total CHOL/HDL ratio 2.2 Cholesterol 638 HDL Cholesterol 83 LDL (calc) 92 Triglycerides 41 VLDL 8   A1c  4.9   EKG Qtc 441   Ordered covid test  Lauree Chandler, NP 03/26/2023, 12:11 PM

## 2023-03-26 NOTE — Group Note (Signed)
[facility] LCSW Group Therapy Note   Group Date: 03/26/2023 Start Time: 1315 End Time: 1405  Type of Therapy/Topic:  Group Therapy:  Feelings about Diagnosis  Participation Level:  Active    Description of Group:    This group will allow patients to explore their thoughts and feelings about diagnoses they have received. Patients will be guided to explore their level of understanding and acceptance of these diagnoses. Facilitator will encourage patients to process their thoughts and feelings about the reactions of others to their diagnosis, and will guide patients in identifying ways to discuss their diagnosis with significant others in their lives. This group will be process-oriented, with patients participating in exploration of their own experiences as well as giving and receiving support and challenge from other group members.   Therapeutic Goals: 1. Patient will demonstrate understanding of diagnosis as evidence by identifying two or more symptoms of the disorder:  2. Patient will be able to express two feelings regarding the diagnosis 3. Patient will demonstrate ability to communicate their needs through discussion and/or role plays  Summary of Patient Progress: Patient was present for the entirety of the group process. She was active in the discussion. Pt defined diagnosis as labels that defined her as missing/lacking something. She, however, shared that she is beginning to think that she has no moral failings but she does have a disease. Her comments were pertinent to the discussion. She appeared open and receptive to feedback from both peers and facilitator. Insight into to topic present.    Therapeutic Modalities:   Cognitive Behavioral Therapy Brief Therapy Feelings Identification    Glenis Smoker, LCSW

## 2023-03-26 NOTE — BH IP Treatment Plan (Signed)
Interdisciplinary Treatment and Diagnostic Plan Update  03/26/2023 Time of Session: 09:00 Stacey [name] MRN: [id number]  Principal Diagnosis: Bipolar affective disorder, currently manic, moderate (HCC)  Secondary Diagnoses: Principal Problem:   Bipolar affective disorder, currently manic, moderate (HCC)   Current Medications:  Current Facility-Administered Medications  Medication Dose Route Frequency Provider Last Rate Last Admin   acetaminophen (TYLENOL) tablet 650 mg  650 mg Oral Q6H PRN Motley-Mangrum, Jadeka A, PMHNP   650 mg at 03/24/23 2152   alum & mag hydroxide-simeth (MAALOX/MYLANTA) 200-200-20 MG/5ML suspension 30 mL  30 mL Oral Q4H PRN Motley-Mangrum, Jadeka A, PMHNP       diphenhydrAMINE (BENADRYL) capsule 50 mg  50 mg Oral TID PRN Motley-Mangrum, Jadeka A, PMHNP       Or   diphenhydrAMINE (BENADRYL) injection 50 mg  50 mg Intramuscular TID PRN Motley-Mangrum, Jadeka A, PMHNP       DULoxetine (CYMBALTA) DR capsule 60 mg  60 mg Oral Daily Lauree Chandler, NP   60 mg at 03/26/23 0900   gabapentin (NEURONTIN) capsule 300 mg  300 mg Oral TID Motley-Mangrum, Jadeka A, PMHNP   300 mg at 03/26/23 0900   haloperidol (HALDOL) tablet 5 mg  5 mg Oral TID PRN Motley-Mangrum, Jadeka A, PMHNP       Or   haloperidol lactate (HALDOL) injection 5 mg  5 mg Intramuscular TID PRN Motley-Mangrum, Jadeka A, PMHNP       hydrOXYzine (ATARAX) tablet 25 mg  25 mg Oral TID PRN Motley-Mangrum, Jadeka A, PMHNP   25 mg at 03/25/23 2102   ibuprofen (ADVIL) tablet 600 mg  600 mg Oral Q6H PRN Motley-Mangrum, Jadeka A, PMHNP   600 mg at 03/26/23 0454   lamoTRIgine (LAMICTAL) tablet 200 mg  200 mg Oral BID Motley-Mangrum, Jadeka A, PMHNP   200 mg at 03/26/23 0900   lidocaine (LIDODERM) 5 % 2 patch  2 patch Transdermal Q24H Charm Rings, NP   2 patch at 03/26/23 0859   magnesium hydroxide (MILK OF MAGNESIA) suspension 30 mL  30 mL Oral Daily PRN Motley-Mangrum, Jadeka A, PMHNP   30 mL at  03/23/23 2131   nicotine polacrilex (NICORETTE) gum 2 mg  2 mg Oral PRN Lauree Chandler, NP   2 mg at 03/26/23 0900   risperiDONE (RISPERDAL) tablet 2 mg  2 mg Oral QHS Charm Rings, NP   2 mg at 03/25/23 2059   senna-docusate (Senokot-S) tablet 2 tablet  2 tablet Oral BID PRN Charm Rings, NP       traZODone (DESYREL) tablet 150 mg  150 mg Oral QHS Motley-Mangrum, Jadeka A, PMHNP   150 mg at 03/25/23 2059   Facility-Administered Medications Ordered in Other Encounters  Medication Dose Route Frequency Provider Last Rate Last Admin   fentaNYL (SUBLIMAZE) injection 25 mcg  25 mcg Intravenous Q5 min PRN Yves Dill, MD       ondansetron [facility]) injection 4 mg  4 mg Intravenous Once PRN Yves Dill, MD       PTA Medications: Medications Prior to Admission  Medication Sig Dispense Refill Last Dose   albuterol (VENTOLIN HFA) 108 (90 Base) MCG/ACT inhaler Inhale 1-2 puffs into the lungs every 6 (six) hours as needed for wheezing or shortness of breath. (Patient taking differently: Inhale 1 puff into the lungs in the morning and at bedtime.) 18 g 0    cetirizine (ZYRTEC) 10 MG tablet Take 10 mg by mouth daily.  clobetasol (TEMOVATE) 0.05 % external solution Apply 1 Application topically as needed (psoriasis).      cyclobenzaprine (FLEXERIL) 10 MG tablet Take 1 tablet 3 times a day by oral route as needed for 7 days. (Patient not taking: Reported on 03/17/2023)      diclofenac Sodium (VOLTAREN) 1 % GEL Apply 1 Application topically 3 (three) times daily as needed (pain).      DULoxetine HCl 40 MG CPEP Take 40 mg by mouth daily.      gabapentin (NEURONTIN) 300 MG capsule Take 1 capsule (300 mg total) by mouth 3 (three) times daily.      HYDROcodone-acetaminophen (NORCO) 10-325 MG tablet Take 1 tablet by mouth 6 (six) times daily as needed for pain. (Patient not taking: Reported on 03/17/2023) 180 tablet 0    hydrOXYzine (VISTARIL) 25 MG capsule Take 25 mg by mouth 4 (four) times daily as  needed for anxiety.      lamoTRIgine (LAMICTAL) 200 MG tablet Take 200 mg by mouth 2 (two) times daily.      LIDOCAINE EX Apply 1 patch topically as needed (pain).      lurasidone (LATUDA) 40 MG TABS tablet Take 40 mg by mouth daily with breakfast.      methylPREDNISolone (MEDROL DOSEPAK) 4 MG TBPK tablet See admin instructions. follow package directions (Patient not taking: Reported on 03/17/2023)      naloxone [facility]) nasal spray 4 mg/0.1 mL Place 1 spray into the nose once. Prn opioid overdose      nystatin powder Apply 1 Application topically 2 (two) times daily. Groin area & sacral area (Patient not taking: Reported on 03/17/2023)      tiZANidine (ZANAFLEX) 4 MG tablet Take 6 mg by mouth as needed for muscle spasms. For back spasms      traZODone (DESYREL) 150 MG tablet Take 150 mg by mouth at bedtime as needed for sleep.       Patient Stressors: Financial difficulties   Marital or family conflict   Medication change or noncompliance   Traumatic event    Patient Strengths: Average or above average intelligence  Communication skills  Motivation for treatment/growth  Physical Health   Treatment Modalities: Medication Management, Group therapy, Case management,  1 to 1 session with clinician, Psychoeducation, Recreational therapy.   Physician Treatment Plan for Primary Diagnosis: Bipolar affective disorder, currently manic, moderate (HCC) Long Term Goal(s): Improvement in symptoms so as ready for discharge   Short Term Goals: Ability to identify changes in lifestyle to reduce recurrence of condition will improve Ability to verbalize feelings will improve Ability to demonstrate self-control will improve Ability to identify and develop effective coping behaviors will improve Ability to identify triggers associated with substance abuse/mental health issues will improve  Medication Management: Evaluate patient's response, side effects, and tolerance of medication  regimen.  Therapeutic Interventions: 1 to 1 sessions, Unit Group sessions and Medication administration.  Evaluation of Outcomes: Progressing  Physician Treatment Plan for Secondary Diagnosis: Principal Problem:   Bipolar affective disorder, currently manic, moderate (HCC)  Long Term Goal(s): Improvement in symptoms so as ready for discharge   Short Term Goals: Ability to identify changes in lifestyle to reduce recurrence of condition will improve Ability to verbalize feelings will improve Ability to demonstrate self-control will improve Ability to identify and develop effective coping behaviors will improve Ability to identify triggers associated with substance abuse/mental health issues will improve     Medication Management: Evaluate patient's response, side effects, and tolerance of medication regimen.  Therapeutic Interventions: 1 to 1 sessions, Unit Group sessions and Medication administration.  Evaluation of Outcomes: Progressing   RN Treatment Plan for Primary Diagnosis: Bipolar affective disorder, currently manic, moderate (HCC) Long Term Goal(s): Knowledge of disease and therapeutic regimen to maintain health will improve  Short Term Goals: Ability to remain free from injury will improve, Ability to verbalize frustration and anger appropriately will improve, Ability to demonstrate self-control, Ability to participate in decision making will improve, Ability to verbalize feelings will improve, Ability to disclose and discuss suicidal ideas, Ability to identify and develop effective coping behaviors will improve, and Compliance with prescribed medications will improve  Medication Management: RN will administer medications as ordered by provider, will assess and evaluate patient's response and provide education to patient for prescribed medication. RN will report any adverse and/or side effects to prescribing provider.  Therapeutic Interventions: 1 on 1 counseling sessions,  Psychoeducation, Medication administration, Evaluate responses to treatment, Monitor vital signs and CBGs as ordered, Perform/monitor CIWA, COWS, AIMS and Fall Risk screenings as ordered, Perform wound care treatments as ordered.  Evaluation of Outcomes: Progressing   LCSW Treatment Plan for Primary Diagnosis: Bipolar affective disorder, currently manic, moderate (HCC) Long Term Goal(s): Safe transition to appropriate next level of care at discharge, Engage patient in therapeutic group addressing interpersonal concerns.  Short Term Goals: Engage patient in aftercare planning with referrals and resources, Increase social support, Increase ability to appropriately verbalize feelings, Increase emotional regulation, Facilitate acceptance of mental health diagnosis and concerns, Facilitate patient progression through stages of change regarding substance use diagnoses and concerns, Identify triggers associated with mental health/substance abuse issues, and Increase skills for wellness and recovery  Therapeutic Interventions: Assess for all discharge needs, 1 to 1 time with Social worker, Explore available resources and support systems, Assess for adequacy in community support network, Educate family and significant other(s) on suicide prevention, Complete Psychosocial Assessment, Interpersonal group therapy.  Evaluation of Outcomes: Progressing   Progress in Treatment: Attending groups: Yes. and No. Participating in groups: Yes. Taking medication as prescribed: Yes. Toleration medication: Yes. Family/Significant other contact made: No, will contact:  if given permission. Pt has declined collateral/familial contact.  Patient understands diagnosis: Yes. Discussing patient identified problems/goals with staff: Yes. Medical problems stabilized or resolved: Yes. Denies suicidal/homicidal ideation: Yes. Issues/concerns per patient self-inventory: No. Other: none.  New problem(s) identified: No,  Describe:  none identified.  New Short Term/Long Term Goal(s): detox, elimination of symptoms of psychosis, medication management for mood stabilization; elimination of SI thoughts; development of comprehensive mental wellness/sobriety plan. Update 03/25/23: No changes at this time.   Patient Goals:  "I'm looking to leave the state"  Update 03/25/23: No changes at this time.   Discharge Plan or Barriers: CSW to assist in the development of appropriate discharge plans. Update 03/25/23: No changes at this time.   Reason for Continuation of Hospitalization: Anxiety Delusions  Depression Hallucinations Medication stabilization Suicidal ideation   Estimated Length of Stay:  1-7 days Update 03/25/23: TBD  Last 3 [place] Suicide Severity Risk Score: Flowsheet Row Admission (Current) from 03/19/2023 in [facility] from 03/16/2023 in [facility]) [facility]  C-SSRS RISK CATEGORY No Risk No Risk No Risk       Last PHQ 2/9 Scores:    11/17/2022   12:26 AM  Depression screen PHQ 2/9  Decreased Interest 1  Down, Depressed, Hopeless 1  PHQ -  2 Score 2  Altered sleeping 1  Tired, decreased energy 1  Change in appetite 1  Feeling bad or failure about yourself  1  Trouble concentrating 1  Moving slowly or fidgety/restless 1  Suicidal thoughts 1  PHQ-9 Score 9  Difficult doing work/chores Somewhat difficult    Scribe for Treatment Team: Glenis Smoker, LCSW 03/26/2023 10:25 AM

## 2023-03-26 NOTE — Plan of Care (Signed)
  Problem: Education: Goal: Emotional status will improve Outcome: Progressing Goal: Mental status will improve Outcome: Progressing   Problem: Coping: Goal: Ability to identify and develop effective coping behavior will improve Outcome: Progressing

## 2023-03-26 NOTE — Plan of Care (Signed)
  Problem: Education: Goal: Knowledge of [facility] Education information/materials will improve Outcome: Progressing Goal: Emotional status will improve Outcome: Progressing Goal: Mental status will improve Outcome: Progressing Goal: Verbalization of understanding the information provided will improve Outcome: Progressing   Problem: Health Behavior/Discharge Planning: Goal: Identification of resources available to assist in meeting health care needs will improve Outcome: Progressing Goal: Compliance with treatment plan for underlying cause of condition will improve Outcome: Progressing   Problem: Activity: Goal: Will identify at least one activity in which they can participate Outcome: Progressing   Problem: Coping: Goal: Ability to identify and develop effective coping behavior will improve Outcome: Progressing Goal: Ability to interact with others will improve Outcome: Progressing Goal: Demonstration of participation in decision-making regarding own care will improve Outcome: Progressing Goal: Ability to use eye contact when communicating with others will improve Outcome: Progressing   Problem: Self-Concept: Goal: Will verbalize positive feelings about self Outcome: Progressing   

## 2023-03-26 NOTE — Group Note (Signed)
Date:  03/26/2023 Time:  10:18 AM  Group Topic/Focus:  Goals Group:   The focus of this group is to help patients establish daily goals to achieve during treatment and discuss how the patient can incorporate goal setting into their daily lives to aide in recovery.    Participation Level:  Active  Participation Quality:  Appropriate  Affect:  Appropriate  Cognitive:  Appropriate  Insight: Appropriate  Engagement in Group:  Engaged  Modes of Intervention:  Discussion, Education, and Support  Additional Comments:    Wilford Corner 03/26/2023, 10:18 AM

## 2023-03-26 NOTE — [facility] Group Notes (Signed)
Adult Psychoeducational Group Note  Date:  03/26/2023 Time:  4:35 PM  Group Topic/Focus:  Emotional Education:   The focus of this group is to discuss what feelings/emotions are, and how they are experienced.  Participation Level:  Active  Participation Quality:  Appropriate  Affect:  Appropriate  Cognitive:  Appropriate  Insight: Appropriate  Engagement in Group:  Engaged  Modes of Intervention:  Discussion  Additional Comments:    Lucilla Edin 03/26/2023, 4:35 PMBHH Group Notes:  (Nursing/MHT/Case Management/Adjunct)

## 2023-03-26 NOTE — Progress Notes (Signed)
D- Patient alert and oriented x 4. Affect flat/mood congruent. Denies SI/ HI/ AVH. Patient complains of back pain 6/10. Lidocaine patch and Ibuprofen administered with fair results- 4/10.  Patient endorses depression and anxiety. Hydroxyzine 25 mg administered for c/o of anxiety with fair results. States her goal today is to find "a home".  A- Scheduled and PRN medications administered to patient, per MD orders. Support and encouragement provided.  Routine safety checks conducted every 15 minutes without incident.  Patient informed to notify staff with problems or concerns and verbalizes understanding. R- No adverse drug reactions noted.  Patient compliant with medications and treatment plan. Patient receptive, calm cooperative and interacts well with others on the unit.  Patient contracts for safety and  remains safe on the unit at this time.

## 2023-03-26 NOTE — Group Note (Signed)
Recreation Therapy Group Note   Group Topic:Coping Skills  Group Date: 03/26/2023 Start Time: 1010 End Time: 1100 Facilitators: Rosina Lowenstein, LRT, CTRS Location:  Craft Room  Group Description: Mind Map.  Patient was provided a blank template of a diagram with 32 blank boxes in a tiered system, branching from the center (similar to a bubble chart). LRT directed patients to label the middle of the diagram "Coping Skills". LRT and patients then came up with 8 different coping skills as examples. Pt were directed to record their coping skills in the 2nd tier boxes closest to the center.  Patients would then share their coping skills with the group as LRT wrote them out. LRT gave a handout of 99 different coping skills at the end of group.    Goal Area(s) Addressed: Patients will be able to define "coping skills". Patient will identify new coping skills.  Patient will increase communication.   Affect/Mood: Appropriate   Participation Level: Active and Engaged   Participation Quality: Independent   Behavior: Appropriate, Calm, and Cooperative   Speech/Thought Process: Coherent   Insight: Good   Judgement: Good   Modes of Intervention: Activity and Worksheet   Patient Response to Interventions:  Attentive, Engaged, Interested , and Receptive   Education Outcome:  Acknowledges education   Clinical Observations/Individualized Feedback: Lawson Fiscal was active in their participation of session activities and group discussion. Pt identified "gardening, going outside, reading recipes, doing research, learning new things" as coping skills. Pt spontaneously contributed to group discussion while interacting well with LRT and peers duration of session.   Plan: Continue to engage patient in RT group sessions 2-3x/week.   Rosina Lowenstein, LRT, CTRS 03/26/2023 11:15 AM

## 2023-03-26 NOTE — Progress Notes (Signed)
Patient presents with flat affect but brightens on approach. Denies SI, HI, AVH. Complains of back pain . Prn meds given for anxiety and sleep with good relief. Visible in milieu. Encouragement and support provided. Safety checks maintained. Meds given as prescribed. Pt receptive and remains safe on unit with q 15 min checiks

## 2023-03-27 DIAGNOSIS — F3112 Bipolar disorder, current episode manic without psychotic features, moderate: Secondary | ICD-10-CM | POA: Diagnosis not present

## 2023-03-27 MED ORDER — TRAZODONE HCL 150 MG PO TABS: 150.0000 mg | ORAL_TABLET | Freq: Every day | ORAL | 0 refills | Status: AC

## 2023-03-27 MED ORDER — DULOXETINE HCL 60 MG PO CPEP: 60.0000 mg | ORAL_CAPSULE | Freq: Every day | ORAL | 0 refills | Status: DC

## 2023-03-27 MED ORDER — LAMOTRIGINE 200 MG PO TABS: 200.0000 mg | ORAL_TABLET | Freq: Two times a day (BID) | ORAL | 0 refills | Status: AC

## 2023-03-27 MED ORDER — HYDROXYZINE HCL 25 MG PO TABS: 25.0000 mg | ORAL_TABLET | Freq: Three times a day (TID) | ORAL | 0 refills | Status: AC | PRN

## 2023-03-27 MED ORDER — GABAPENTIN 300 MG PO CAPS: 300.0000 mg | ORAL_CAPSULE | Freq: Three times a day (TID) | ORAL | 0 refills | Status: AC

## 2023-03-27 MED ORDER — LIDOCAINE 5 % EX PTCH: 2.0000 | MEDICATED_PATCH | CUTANEOUS | 0 refills | Status: AC

## 2023-03-27 MED ORDER — RISPERIDONE 2 MG PO TABS: 2.0000 mg | ORAL_TABLET | Freq: Every day | ORAL | 0 refills | Status: DC

## 2023-03-27 MED ORDER — NICOTINE POLACRILEX 2 MG MT GUM: 2.0000 mg | CHEWING_GUM | OROMUCOSAL | 0 refills | Status: AC | PRN

## 2023-03-27 NOTE — Discharge Summary (Signed)
Physician Discharge Summary Note  Patient:  Stacey [name] is an 57 y.o., female MRN:  [id number] DOB:  1965-08-11 Patient phone:  [phone] (home)  Patient address:   [address] [place] [facility] [address],  Total Time spent with patient: 30 minutes  Date of Admission:  03/19/2023 Date of Discharge: 03/27/2023  Reason for Admission:  57 y/o female w/ history of mdd, bipolar 1 disorder, admitted to inpatient psychiatry under IVC petition. Per initial notes in the emergency department, was reported she had not slept in 4 days, was noted to have erratic behavior and excessive talking. Was seen by psychiatry, where she was noted to be confused, disorganized, tangential, paranoid. Was recommended for inpatient psychiatry and transferred to this unit for continued care.   Principal Problem: Bipolar affective disorder, currently manic, moderate (HCC) Discharge Diagnoses: Principal Problem:   Bipolar affective disorder, currently manic, moderate (HCC)  Subjective: Pt chart reviewed, discussed with interdisciplinary team and seen on rounds. She states her mood today is "pretty good". Appetite and sleep have been "good". Denies suicidal, homicidal ideations. Denies auditory visual hallucinations or paranoia. Looking forward to discharge today and going to her friend's home. We reviewed her discharge medications. Covid test had been ordered for her at her request but was not collected. Asked her about this and she states after the assessment that day she took a shower and "felt much better". She feels her symptoms were more related to anxiety. Declines covid test now. Order discontinued.  Past Psychiatric History: mdd, ptsd  Past Medical History:  Past Medical History:  Diagnosis Date   Allergic rhinitis, cause unspecified    Anxiety    Anxiety    Asthma    Beta thalassemia minor Pt has a family hx. Pt has never been tested.    Bipolar 1 disorder (HCC)    Complication of  anesthesia 2004   bp dropped after c section, took 24hrs to feels leg , numbing medicine does not last long when injected into skin   Diabetes mellitus without complication (HCC)    pre diabetic   Difficult intravenous access    hard stick for ivs and lab per pt   Family history of adverse reaction to anesthesia    mom heart stopped times 2 in or   Fibromyalgia    GERD (gastroesophageal reflux disease)    Headache    Iron deficiency anemia, unspecified 02/24/2013   Mental disorder    Morbid obesity (HCC)    Nasal bone fracture age 25   left side cannot place tube in nostril per pt   OSA (obstructive sleep apnea)    uses cpap does not know settings   Pernicious anemia 02/24/2013   Thyroid nodule     Past Surgical History:  Procedure Laterality Date   CESAREAN SECTION     COLONOSCOPY WITH PROPOFOL N/A 01/02/2013   Procedure: COLONOSCOPY WITH PROPOFOL;  Surgeon: Louis Meckel, MD;  Location: [facility];  Service: Endoscopy;  Laterality: N/A;   ESOPHAGOGASTRODUODENOSCOPY N/A 01/02/2013   Procedure: ESOPHAGOGASTRODUODENOSCOPY (EGD);  Surgeon: Louis Meckel, MD;  Location: [facility];  Service: Endoscopy;  Laterality: N/A;   noraplant     TUBAL LIGATION     TUBAL LIGATION Bilateral    WISDOM TOOTH EXTRACTION     Family History:  Family History  Problem Relation Age of Onset   Breast cancer Mother 10   Asthma Mother    Bipolar disorder Mother    Parkinson's disease Mother  Diabetes Father    Breast cancer Maternal Aunt 48   Esophageal cancer Paternal Aunt        dx >50   Esophageal cancer Paternal Uncle        dx > 50   Breast cancer Maternal Grandmother 4   Emphysema Paternal Grandmother    Colon polyps Paternal Grandfather        unknown #   Family Psychiatric  History: Stacey [name] was admitted for Bipolar affective disorder, currently manic, moderate (HCC) and crisis management. Stacey [name] was discharged with current medication and was  instructed on how to take medications as prescribed; (details listed below under Medication List).  Medical problems were identified and treated as needed.  Home medications were restarted as appropriate.  Improvement was monitored by observation and Stacey [name] daily report of symptom reduction.  Emotional and mental status was monitored by daily self-inventory reports completed by Stacey [name] and clinical staff.         Stacey [name] was evaluated by the treatment team for stability and plans for continued recovery upon discharge.  Stacey [name] motivation was an integral factor for scheduling further treatment.  Employment, transportation, bed availability, health status, family support, and any pending legal issues were also considered during her hospital stay.  Stacey [name] will follow up with the services as listed below under Follow Up Information.     Upon completion of this admission the Stacey [name] was both mentally and medically stable for discharge denying suicidal/homicidal ideation, auditory/visual/tactile hallucinations, delusional thoughts and paranoia.     Social History:  Social History   Substance and Sexual Activity  Alcohol Use Not Currently   Comment: socially     Social History   Substance and Sexual Activity  Drug Use No    Social History   Socioeconomic History   Marital status: Divorced    Spouse name: [name]   Number of children: 4   Years of education: Bachelors   Highest education level: Not on file  Occupational History   Occupation: Unemployed  Tobacco Use   Smoking status: Former    Current packs/day: 0.00    Average packs/day: 0.5 packs/day for 20.0 years (10.0 ttl pk-yrs)    Types: Cigarettes    Start date: 02/05/1987    Quit date: 07/30/2006    Years since quitting: 16.6   Smokeless tobacco: Never   Tobacco comments:    quit 7 years ago  Vaping Use   Vaping status: Every Day    Substances: Nicotine  Substance and Sexual Activity   Alcohol use: Not Currently    Comment: socially   Drug use: No   Sexual activity: Never    Birth control/protection: Surgical  Other Topics Concern   Not on file  Social History Narrative   She lives with her husbandMorgandale), 4 children 21, 15, 13, 10, she stays at home.   Patient has a Bachelor's degree.   Patient is right- handed.   Patient drinks one cup of coffee in the morning, sometimes 2 cups.         Social Determinants of Health   Financial Resource Strain: High Risk (05/18/2022)   Received from [facility], [facility]   Overall Financial Resource Strain (CARDIA)    Difficulty of Paying Living Expenses: Very hard  Food Insecurity: Food Insecurity Present (03/19/2023)   Hunger Vital Sign    Worried  About Running Out of Food in the Last Year: Sometimes true    Ran Out of Food in the Last Year: Sometimes true  Transportation Needs: No Transportation Needs (03/19/2023)   PRAPARE - Administrator, Civil Service (Medical): No    Lack of Transportation (Non-Medical): No  Physical Activity: Inactive (05/18/2022)   Received from [facility], [facility]   Exercise Vital Sign    Days of Exercise per Week: 0 days    Minutes of Exercise per Session: 0 min  Stress: Stress Concern Present (05/18/2022)   Received from [facility] - Occupational Stress Questionnaire    Feeling of Stress : Very much  Social Connections: Socially Isolated (05/18/2022)   Received from [facility], [facility]   Social Network    How would you rate your social network (family, work, friends)?: Little participation, lonely and socially isolated    Hospital Course:  Stacey [name] was admitted for Bipolar affective disorder, currently manic, moderate (HCC) and crisis management.  Stacey [name] was discharged with current medication and was  instructed on how to take medications as prescribed; (details listed below under Medication List).  Medical problems were identified and treated as needed.  Home medications were restarted as appropriate.  Improvement was monitored by observation and Stacey [name] daily report of symptom reduction.  Emotional and mental status was monitored by daily self-inventory reports completed by Stacey [name] and clinical staff.         Stacey [name] was evaluated by the treatment team for stability and plans for continued recovery upon discharge.  Stacey [name] motivation was an integral factor for scheduling further treatment.  Employment, transportation, bed availability, health status, family support, and any pending legal issues were also considered during her hospital stay. Stacey [name] will follow up with the services as listed below under Follow Up Information.     Upon completion of this admission the Stacey [name] was both mentally and medically stable for discharge denying suicidal/homicidal ideation, auditory/visual/tactile hallucinations, delusional thoughts and paranoia.     Physical Findings: AIMS:  , ,  ,  ,    CIWA:    COWS:     Musculoskeletal: Strength & Muscle Tone: within normal limits Gait & Station: normal Patient leans: N/A  Psychiatric Specialty Exam:  Presentation  General Appearance:  Appropriate for Environment; Fairly Groomed  Eye Contact: Fair  Speech: Clear and Coherent; Normal Rate  Speech Volume: Normal  Handedness: Right  Mood and Affect  Mood: -- ("pretty good")  Affect: Appropriate; Full Range  Thought Process  Thought Processes: Coherent; Goal Directed; Linear  Descriptions of Associations:Intact  Orientation:Full (Time, Place and Person)  Thought Content:Logical  Duration of Psychotic Symptoms:N/A  Hallucinations:Hallucinations: None  Ideas of Reference:None  Suicidal  Thoughts:Suicidal Thoughts: No  Homicidal Thoughts:Homicidal Thoughts: No  Sensorium  Memory: Immediate Fair  Judgment: Fair  Insight: Fair  Art therapist  Concentration: Fair  Attention Span: Fair  Recall: [employer] of Knowledge: Fair  Language: Fair  Psychomotor Activity  Psychomotor Activity: Psychomotor Activity: Normal  Assets  Assets: Communication Skills; Desire for Improvement; Financial Resources/Insurance  Sleep  Sleep: Sleep: Good  Physical Exam: Physical Exam Constitutional:      General: She is not in acute distress.    Appearance: She is not ill-appearing, toxic-appearing or diaphoretic.  Eyes:  General: No scleral icterus. Cardiovascular:     Rate and Rhythm: Normal rate.  Pulmonary:     Effort: Pulmonary effort is normal. No respiratory distress.  Neurological:     Mental Status: She is alert and oriented to person, place, and time.  Psychiatric:        Attention and Perception: Attention and perception normal.        Mood and Affect: Mood and affect normal.        Speech: Speech normal.        Behavior: Behavior normal. Behavior is cooperative.        Thought Content: Thought content normal.        Cognition and Memory: Cognition and memory normal.        Judgment: Judgment normal.    Review of Systems  Constitutional:  Negative for chills and fever.  Respiratory:  Negative for shortness of breath.   Cardiovascular:  Negative for chest pain and palpitations.  Gastrointestinal:  Negative for abdominal pain.  Neurological:  Negative for headaches.  Psychiatric/Behavioral: Negative.     Blood pressure (!) 145/84, pulse 86, temperature 97.8 F (36.6 C), resp. rate (!) 22, height 5\' 8"  (1.727 m), weight 111.6 kg, last menstrual period 09/21/2015, SpO2 98%. Body mass index is 37.4 kg/m.   Social History   Tobacco Use  Smoking Status Former   Current packs/day: 0.00   Average packs/day: 0.5 packs/day for 20.0 years  (10.0 ttl pk-yrs)   Types: Cigarettes   Start date: 02/05/1987   Quit date: 07/30/2006   Years since quitting: 16.6  Smokeless Tobacco Never  Tobacco Comments   quit 7 years ago   Tobacco Cessation:  A prescription for an FDA-approved tobacco cessation medication provided at discharge  Blood Alcohol level:  Lab Results  Component Value Date   ETH <10 03/17/2023   ETH <10 11/16/2022   Metabolic Disorder Labs:  Lab Results  Component Value Date   HGBA1C 4.9 03/21/2023   MPG 93.93 03/21/2023   MPG 111 01/10/2015   No results found for: "PROLACTIN" Lab Results  Component Value Date   CHOL 183 03/21/2023   TRIG 41 03/21/2023   HDL 83 03/21/2023   CHOLHDL 2.2 03/21/2023   VLDL 8 03/21/2023   LDLCALC 92 03/21/2023   LDLCALC 98 11/16/2022   See Psychiatric Specialty Exam and Suicide Risk Assessment completed by Attending Physician prior to discharge.  Discharge destination:  Home  Is patient on multiple antipsychotic therapies at discharge:  No   Has Patient had three or more failed trials of antipsychotic monotherapy by history:  No  Recommended Plan for Multiple Antipsychotic Therapies: NA  Allergies as of 03/27/2023       Reactions   Bee Venom Swelling   Latex Other (See Comments), Swelling, Hives   Blisters where the latex touched it   Wheat Anaphylaxis   Headache   Bupropion Swelling, Other (See Comments)   swelling in hands   Bupropion Hcl Other (See Comments)   swelling in hands   Strawberry Extract Other (See Comments), Rash   Caused facial swelling and rash   Tomato Rash   Face breaks out   Citalopram Hydrobromide Other (See Comments)   Doesn't remember reaction   Other Other (See Comments)   Bleach cause vocal cord to close        Medication List     STOP taking these medications    cyclobenzaprine 10 MG tablet Commonly known as: FLEXERIL   HYDROcodone-acetaminophen  10-325 MG tablet Commonly known as: NORCO   hydrOXYzine 25 MG  capsule Commonly known as: VISTARIL   LIDOCAINE EX Replaced by: lidocaine 5 %   lurasidone 40 MG Tabs tablet Commonly known as: LATUDA   methylPREDNISolone 4 MG Tbpk tablet Commonly known as: MEDROL DOSEPAK   nystatin powder       TAKE these medications      Indication  albuterol 108 (90 Base) MCG/ACT inhaler Commonly known as: VENTOLIN HFA Inhale 1-2 puffs into the lungs every 6 (six) hours as needed for wheezing or shortness of breath. What changed:  how much to take when to take this  Indication: Asthma   cetirizine 10 MG tablet Commonly known as: ZYRTEC Take 10 mg by mouth daily.  Indication: Hayfever   clobetasol 0.05 % external solution Commonly known as: TEMOVATE Apply 1 Application topically as needed (psoriasis).  Indication: Skin Inflammation   DULoxetine 60 MG capsule Commonly known as: CYMBALTA Take 1 capsule (60 mg total) by mouth daily. What changed:  medication strength how much to take  Indication: Fibromyalgia Syndrome, Major Depressive Disorder   gabapentin 300 MG capsule Commonly known as: NEURONTIN Take 1 capsule (300 mg total) by mouth 3 (three) times daily.  Indication: Generalized Anxiety Disorder, Neuropathic Pain   hydrOXYzine 25 MG tablet Commonly known as: ATARAX Take 1 tablet (25 mg total) by mouth 3 (three) times daily as needed for anxiety.  Indication: Feeling Anxious   lamoTRIgine 200 MG tablet Commonly known as: LAMICTAL Take 1 tablet (200 mg total) by mouth 2 (two) times daily.  Indication: Manic-Depression   lidocaine 5 % Commonly known as: LIDODERM Place 2 patches onto the skin daily. Remove & Discard patch within 12 hours or as directed by MD Replaces: LIDOCAINE EX  Indication: Allodynia   naloxone 4 MG/0.1ML Liqd nasal spray kit Commonly known as: NARCAN Place 1 spray into the nose once. Prn opioid overdose  Indication: Opioid Overdose   nicotine polacrilex 2 MG gum Commonly known as: NICORETTE Take 1 each  (2 mg total) by mouth as needed for smoking cessation.  Indication: Nicotine Addiction   risperiDONE 2 MG tablet Commonly known as: RISPERDAL Take 1 tablet (2 mg total) by mouth at bedtime.  Indication: Manic Phase of Manic-Depression   tiZANidine 4 MG tablet Commonly known as: ZANAFLEX Take 6 mg by mouth as needed for muscle spasms. For back spasms  Indication: Muscle Spasticity   traZODone 150 MG tablet Commonly known as: DESYREL Take 1 tablet (150 mg total) by mouth at bedtime. What changed:  when to take this reasons to take this  Indication: Trouble Sleeping   Voltaren 1 % Gel Generic drug: diclofenac Sodium Apply 1 Application topically 3 (three) times daily as needed (pain).  Indication: Joint Damage causing Pain and Loss of Function       Follow-up recommendations:   Follow up with outpatient psychiatry and counseling  Signed: Lauree Chandler, NP 03/27/2023, 8:57 AM

## 2023-03-27 NOTE — Group Note (Signed)
Date:  03/27/2023 Time:  12:07 AM  Group Topic/Focus:  Wrap-Up Group:   The focus of this group is to help patients review their daily goal of treatment and discuss progress on daily workbooks.    Participation Level:  Active  Participation Quality:  Appropriate and Supportive  Affect:  Appropriate  Cognitive:  Alert and Appropriate  Insight: Appropriate  Engagement in Group:  Engaged  Modes of Intervention:  Activity  Additional Comments:     [name],Stacey Roark E 03/27/2023, 12:07 AM

## 2023-03-27 NOTE — Progress Notes (Signed)
Discharge Note:  Patient denies SI/HI/AVH at this time. Discharge instructions, AVS and transition record gone over with patient. Patient agrees to comply with medication management, follow-up visit, and outpatient therapy. Patient belongings returned to patient. Patient questions and concerns addressed and answered. Patient ambulatory off unit. Patient discharged to home with safe transport.

## 2023-03-27 NOTE — Progress Notes (Signed)
  [facility] Adult Case Management Discharge Plan :  Will you be returning to the same living situation after discharge:  No. Pt will be living with a friend  At discharge, do you have transportation home?: Yes,  CSW will provide taxi  Do you have the ability to pay for your medications: Yes,  [employer] / [employer] TAIL[employer]ED PLAN  Release of information consent forms completed and in the chart;  Patient's signature needed at discharge.  Patient to Follow up at:  Follow-up Information     [facility] Follow up on 05/07/2023.   Why: Per your request your next appointment is November 8th at 5:50 PM. They stated all of your medicines have been sent in for 90 day supplies as requested by you. Contact information: [address] [place] [address] [phone]                 Next level of care provider has access to [facility] Link:no  Safety Planning and Suicide Prevention discussed: Yes,  SPE discussed with pt   Have you used any form of tobacco in the last 30 days? (Cigarettes, Smokeless Tobacco, Cigars, and/or Pipes): No  Has patient been referred to the Quitline?: Patient refused referral for treatment  Patient has been referred for addiction treatment: No known substance use disorder.  [address], LCSWA 03/27/2023, 9:55 AM

## 2023-03-27 NOTE — [facility] Suicide Risk Assessment (Signed)
[facility] INPATIENT:  Family/Significant Other Suicide Prevention Education  Suicide Prevention Education:  Patient Refusal for Family/Significant Other Suicide Prevention Education: The patient Stacey [name] has refused to provide written consent for family/significant other to be provided Family/Significant Other Suicide Prevention Education during admission and/or prior to discharge.  Physician notified.  Elza Rafter 03/27/2023, 9:57 AM

## 2023-03-27 NOTE — Group Note (Signed)
Date:  03/27/2023 Time:  10:45 AM  Group Topic/Focus:  Self Esteem Action Plan:   The focus of this group is to help patients create a plan to continue to build self-esteem after discharge.    Participation Level:  Active  Participation Quality:  Appropriate, Sharing, and Supportive  Affect:  Appropriate  Cognitive:  Alert, Appropriate, and Oriented  Insight: Appropriate  Engagement in Group:  Developing/Improving, Engaged, and Supportive  Modes of Intervention:  Activity, Discussion, Education, and Socialization  Additional Comments:    Stacey [name] 03/27/2023, 10:45 AM
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
## 2023-03-27 NOTE — Group Note (Signed)
Recreation Therapy Group Note   Group Topic:Relaxation  Group Date: 03/27/2023 Start Time: 1030 End Time: 1120 Facilitators: Rosina Lowenstein, LRT, CTRS Location:  [facility]  Group Description: PMR (Progressive Muscle Relaxation). LRT asks patients their current level of stress/anxiety from 1-10, with 10 being the highest. LRT educates patients on what PMR is and the benefits that come from it. Patients are asked to sit with their feet flat on the floor while sitting up and all the way back in their chair, if possible. LRT and pts follow a prompt through a speaker that requires you to tense and release different muscles in their body and focus on their breathing. During session, lights are off and soft music is being played. At the end of the prompt, LRT asks patients to rank their current levels of stress/anxiety from 1-10, 10 being the highest.   Goal Area(s) Addressed:  Patients will be able to describe progressive muscle relaxation.  Patient will practice using relaxation technique. Patient will identify a new coping skill.  Patient will follow multistep directions to reduce anxiety and stress.   Affect/Mood: Appropriate   Participation Level: Active and Engaged   Participation Quality: Independent   Behavior: Calm and Cooperative   Speech/Thought Process: Coherent   Insight: Good   Judgement: Fair    Modes of Intervention: Activity   Patient Response to Interventions:  Attentive, Engaged, and Receptive   Education Outcome:  Acknowledges education   Clinical Observations/Individualized Feedback: Lawson Fiscal was active in their participation of session activities and group discussion. Pt identified "I have done something like this before and it really lowers my blood pressure when I get anxious." Pt came to group late and did not complete the before and after handout. Pt completed prompts while interacting well with LRT and peers duration of session.   Plan: Continue to engage  patient in RT group sessions 2-3x/week.   Rosina Lowenstein, LRT, CTRS 03/27/2023 12:32 PM

## 2023-03-27 NOTE — Group Note (Signed)
Date:  03/27/2023 Time:  5:12 PM  Group Topic/Focus:  Rediscovering Joy:   The focus of this group is to explore various ways to relieve stress in a positive manner.    Participation Level:  Minimal  Participation Quality:  Appropriate  Affect:  Appropriate  Cognitive:  Alert and Appropriate  Insight: Appropriate  Engagement in Group:  Developing/Improving and Engaged  Modes of Intervention:  Activity and Socialization  Additional Comments:    Rosaura Carpenter 03/27/2023, 5:12 PM

## 2023-03-27 NOTE — Plan of Care (Signed)
  Problem: Education: Goal: Knowledge of [facility] Education information/materials will improve Outcome: Adequate for Discharge Goal: Emotional status will improve Outcome: Adequate for Discharge Goal: Mental status will improve Outcome: Adequate for Discharge Goal: Verbalization of understanding the information provided will improve Outcome: Adequate for Discharge   Problem: Health Behavior/Discharge Planning: Goal: Identification of resources available to assist in meeting health care needs will improve Outcome: Adequate for Discharge Goal: Compliance with treatment plan for underlying cause of condition will improve Outcome: Adequate for Discharge   Problem: Activity: Goal: Will identify at least one activity in which they can participate Outcome: Adequate for Discharge   Problem: Coping: Goal: Ability to identify and develop effective coping behavior will improve Outcome: Adequate for Discharge Goal: Ability to interact with others will improve Outcome: Adequate for Discharge Goal: Demonstration of participation in decision-making regarding own care will improve Outcome: Adequate for Discharge Goal: Ability to use eye contact when communicating with others will improve Outcome: Adequate for Discharge   Problem: Self-Concept: Goal: Will verbalize positive feelings about self Outcome: Adequate for Discharge   

## 2024-04-29 ENCOUNTER — Encounter (HOSPITAL_COMMUNITY): Payer: Self-pay | Admitting: Emergency Medicine

## 2024-04-29 ENCOUNTER — Emergency Department (HOSPITAL_COMMUNITY): Payer: MEDICAID

## 2024-04-29 ENCOUNTER — Emergency Department (HOSPITAL_COMMUNITY)
Admission: EM | Admit: 2024-04-29 | Discharge: 2024-04-29 | Disposition: A | Discharge status: Home / Self Care | Payer: MEDICAID | Attending: Emergency Medicine | Admitting: Emergency Medicine

## 2024-04-29 DIAGNOSIS — J45909 Unspecified asthma, uncomplicated: Secondary | ICD-10-CM | POA: Diagnosis not present

## 2024-04-29 DIAGNOSIS — Z91040 Latex allergy status: Secondary | ICD-10-CM | POA: Diagnosis not present

## 2024-04-29 DIAGNOSIS — R079 Chest pain, unspecified: Secondary | ICD-10-CM

## 2024-04-29 DIAGNOSIS — R0602 Shortness of breath: Secondary | ICD-10-CM

## 2024-04-29 DIAGNOSIS — Z7951 Long term (current) use of inhaled steroids: Secondary | ICD-10-CM | POA: Insufficient documentation

## 2024-04-29 DIAGNOSIS — J90 Pleural effusion, not elsewhere classified: Secondary | ICD-10-CM | POA: Insufficient documentation

## 2024-04-29 LAB — URINALYSIS, ROUTINE W REFLEX MICROSCOPIC
Bacteria, UA: NONE SEEN
Bilirubin Urine: NEGATIVE
Glucose, UA: NEGATIVE mg/dL
Hgb urine dipstick: NEGATIVE
Ketones, ur: NEGATIVE mg/dL
Nitrite: NEGATIVE
Protein, ur: NEGATIVE mg/dL
Specific Gravity, Urine: 1.005 (ref 1.005–1.030)
pH: 6 (ref 5.0–8.0)

## 2024-04-29 LAB — CBC
HCT: 41 % (ref 36.0–46.0)
Hemoglobin: 13.2 g/dL (ref 12.0–15.0)
MCH: 28.8 pg (ref 26.0–34.0)
MCHC: 32.2 g/dL (ref 30.0–36.0)
MCV: 89.5 fL (ref 80.0–100.0)
Platelets: 313 K/uL (ref 150–400)
RBC: 4.58 MIL/uL (ref 3.87–5.11)
RDW: 12.5 % (ref 11.5–15.5)
WBC: 8.7 K/uL (ref 4.0–10.5)
nRBC: 0 % (ref 0.0–0.2)

## 2024-04-29 LAB — TROPONIN T, HIGH SENSITIVITY
Troponin T High Sensitivity: <15 ng/L (ref 0–19)
Troponin T High Sensitivity: <15 ng/L (ref 0–19)

## 2024-04-29 LAB — BASIC METABOLIC PANEL WITH GFR
Anion gap: 14 (ref 5–15)
BUN: 13 mg/dL (ref 6–20)
CO2: 20 mmol/L — ABNORMAL LOW (ref 22–32)
Calcium: 9.9 mg/dL (ref 8.9–10.3)
Chloride: 102 mmol/L (ref 98–111)
Creatinine, Ser: 0.67 mg/dL (ref 0.44–1.00)
GFR, Estimated: >60 mL/min (ref >60)
Glucose, Bld: 136 mg/dL — ABNORMAL HIGH (ref 70–99)
Potassium: 3.9 mmol/L (ref 3.5–5.1)
Sodium: 136 mmol/L (ref 135–145)

## 2024-04-29 MED ORDER — IOHEXOL 350 MG/ML SOLN
75.0000 mL | Freq: Once | INTRAVENOUS | Status: AC | PRN
  Administered 2024-04-29: 75 mL via INTRAVENOUS

## 2024-04-29 NOTE — ED Notes (Signed)
 Patient placed in gown and on monitoring equipment.  Patient states normally gets up at 4am and said she was short of breath when she awoke.  Patient currently room air 99% and speaking full sentences without difficulty.

## 2024-04-29 NOTE — ED Triage Notes (Signed)
 Pt presents to the ED via POV with complaints of SOB that started this AM around 0430 with associated CP. She describes the CP as a tight sensation and she is having difficulty taking a deep breath.  A&Ox4 at this time. Denies fevers, chills, cough.

## 2024-04-29 NOTE — ED Provider Notes (Signed)
 [place] EMERGENCY DEPARTMENT AT [facility] Provider Note   CSN: [id number] Arrival date & time: 04/29/24  9472     Patient presents with: Shortness of Breath   Stacey [name] is a 58 y.o. female.   HPI     58yo female with history of asthma, bipolar disorder, OSA who presents with concern for shortness of breath and chest pain.  Reports that she woke up at 4:30 in the morning with a sensation of lightheadedness like she was in a pass out, and then developed sudden shortness of breath and chest pain.  Describes the chest pain as sharp pain followed by pressure.  The pain is worse with deep breaths.  She otherwise has not noticed any exacerbating or relieving factors.  She tried taking Benadryl  and her inhaler and did not notice any change.  Reports that this feels different than her asthma.  Reports she sat in a doctor's office for 4 hours on Friday and had knee swelling but otherwise denies any leg swelling.  No history of DVT or PE.  She has an aunt who has some type of a clotting disorder but she does not believe that she has it.  She denies cough, fever, congestion, nausea, vomiting, abdominal pain.  She does not smoke cigarettes, drink alcohol or use other drugs (other than hx of occ thc)  Has never felt this way.  Pain is now 6/10.  Has a palate biopsy this afternoon.   Father had hx of early heart disease before the age of 58.  Past Medical History:  Diagnosis Date   Allergic rhinitis, cause unspecified    Anxiety    Anxiety    Asthma    Beta thalassemia minor Pt has a family hx. Pt has never been tested.    Bipolar 1 disorder (HCC)    Complication of anesthesia 2004   bp dropped after c section, took 24hrs to feels leg , numbing medicine does not last long when injected into skin   Diabetes mellitus without complication (HCC)    pre diabetic   Difficult intravenous access    hard stick for ivs and lab per pt   Family history of adverse reaction to  anesthesia    mom heart stopped times 2 in or   Fibromyalgia    GERD (gastroesophageal reflux disease)    Headache    Iron deficiency anemia, unspecified 02/24/2013   Mental disorder    Morbid obesity (HCC)    Nasal bone fracture age 13   left side cannot place tube in nostril per pt   OSA (obstructive sleep apnea)    uses cpap does not know settings   Pernicious anemia 02/24/2013   Thyroid  nodule     Past Surgical History:  Procedure Laterality Date   CESAREAN SECTION     COLONOSCOPY WITH PROPOFOL  N/A 01/02/2013   Procedure: COLONOSCOPY WITH PROPOFOL ;  Surgeon: Lamar JONETTA Aho, MD;  Location: [facility];  Service: Endoscopy;  Laterality: N/A;   ESOPHAGOGASTRODUODENOSCOPY N/A 01/02/2013   Procedure: ESOPHAGOGASTRODUODENOSCOPY (EGD);  Surgeon: Lamar JONETTA Aho, MD;  Location: [facility];  Service: Endoscopy;  Laterality: N/A;   noraplant     TUBAL LIGATION     TUBAL LIGATION Bilateral    WISDOM TOOTH EXTRACTION       Prior to Admission medications   Medication Sig Start Date End Date Taking? Authorizing Provider  albuterol  (VENTOLIN  HFA) 108 (90 Base) MCG/ACT inhaler Inhale 1-2 puffs into the lungs every 6 (  six) hours as needed for wheezing or shortness of breath. Patient taking differently: Inhale 1 puff into the lungs in the morning and at bedtime. 08/04/22   Silver Wonda LABOR, PA  cetirizine (ZYRTEC) 10 MG tablet Take 10 mg by mouth daily.    [provider]  clobetasol (TEMOVATE) 0.05 % external solution Apply 1 Application topically as needed (psoriasis). 07/13/22   [provider]  diclofenac Sodium (VOLTAREN) 1 % GEL Apply 1 Application topically 3 (three) times daily as needed (pain).    [provider]  DULoxetine  (CYMBALTA ) 60 MG capsule Take 1 capsule (60 mg total) by mouth daily. 03/27/23 04/26/23  Lee, Jacqueline Eun, NP  gabapentin  (NEURONTIN ) 300 MG capsule Take 1 capsule (300 mg total) by mouth 3 (three) times daily. 03/27/23 04/26/23  Lee,  Jacqueline Eun, NP  lamoTRIgine  (LAMICTAL ) 200 MG tablet Take 1 tablet (200 mg total) by mouth 2 (two) times daily. 03/27/23 04/26/23  Lee, Jacqueline Eun, NP  naloxone [facility]) nasal spray 4 mg/0.1 mL Place 1 spray into the nose once. Prn opioid overdose 08/30/22   [provider]  nicotine  polacrilex (NICORETTE ) 2 MG gum Take 1 each (2 mg total) by mouth as needed for smoking cessation. 03/27/23   Lee, Jacqueline Eun, NP  risperiDONE  (RISPERDAL ) 2 MG tablet Take 1 tablet (2 mg total) by mouth at bedtime. 03/27/23 04/26/23  Lee, Jacqueline Eun, NP  tiZANidine  (ZANAFLEX ) 4 MG tablet Take 6 mg by mouth as needed for muscle spasms. For back spasms 11/01/22   [provider]  traZODone  (DESYREL ) 150 MG tablet Take 1 tablet (150 mg total) by mouth at bedtime. 03/27/23 04/26/23  Lee, Jacqueline Eun, NP    Allergies: Bee venom, Latex, Wheat, Bupropion, Bupropion hcl, Strawberry extract, Tomato, Citalopram hydrobromide, and Other    Review of Systems  Updated Vital Signs BP 112/73 (BP Location: Right Arm)   Pulse 84   Temp 98 F (36.7 C) (Oral)   Resp 18   Ht 5' 8 (1.727 m)   Wt 112.2 kg   LMP 09/21/2015   SpO2 94%   BMI 37.61 kg/m   Physical Exam Vitals and nursing note reviewed.  Constitutional:      General: She is not in acute distress.    Appearance: She is well-developed. She is not diaphoretic.  HENT:     Head: Normocephalic and atraumatic.  Eyes:     Conjunctiva/sclera: Conjunctivae normal.  Cardiovascular:     Rate and Rhythm: Normal rate and regular rhythm.     Heart sounds: Normal heart sounds. No murmur heard.    No friction rub. No gallop.  Pulmonary:     Effort: Pulmonary effort is normal. No respiratory distress.     Breath sounds: Normal breath sounds. No wheezing or rales.  Abdominal:     General: There is no distension.     Palpations: Abdomen is soft.     Tenderness: There is no abdominal tenderness. There is no guarding.  Musculoskeletal:         General: No tenderness.     Cervical back: Normal range of motion.  Skin:    General: Skin is warm and dry.     Findings: No erythema or rash.  Neurological:     Mental Status: She is alert and oriented to person, place, and time.     (all labs ordered are listed, but only abnormal results are displayed) Labs Reviewed  BASIC METABOLIC PANEL WITH GFR - Abnormal; Notable for the  following components:      Result Value   CO2 20 (*)    Glucose, Bld 136 (*)    All other components within normal limits  URINALYSIS, ROUTINE W REFLEX MICROSCOPIC - Abnormal; Notable for the following components:   Leukocytes,Ua SMALL (*)    All other components within normal limits  CBC  TROPONIN T, HIGH SENSITIVITY  TROPONIN T, HIGH SENSITIVITY    EKG: EKG Interpretation Date/Time:  Wednesday April 29 2024 05:39:47 EDT Ventricular Rate:  104 PR Interval:  141 QRS Duration:  93 QT Interval:  337 QTC Calculation: 444 R Axis:   79  Text Interpretation: Sinus tachycardia Borderline T abnormalities, anterior leads No significant change since last tracing , borderline tw changes Confirmed by Dreama Longs ([id number]) on 04/29/2024 8:32:56 AM  Radiology: CT Angio Chest PE W and/or Wo Contrast Result Date: 04/29/2024 CLINICAL DATA:  Pulmonary embolism (PE) suspected, high prob EXAM: CT ANGIOGRAPHY CHEST WITH CONTRAST TECHNIQUE: Multidetector CT imaging of the chest was performed using the standard protocol during bolus administration of intravenous contrast. Multiplanar CT image reconstructions and MIPs were obtained to evaluate the vascular anatomy. RADIATION DOSE REDUCTION: This exam was performed according to the departmental dose-optimization program which includes automated exposure control, adjustment of the mA and/or kV according to patient size and/or use of iterative reconstruction technique. CONTRAST:  75mL OMNIPAQUE  IOHEXOL  350 MG/ML SOLN COMPARISON:  08/04/2022 FINDINGS: Pulmonary Embolism: No  pulmonary embolism. Cardiovascular: No cardiomegaly or pericardial effusion.No aortic aneurysm. Mediastinum/Nodes: No mediastinal mass.Similar appearance of a posterior left thyroid  lobe nodule, measuring 2.8 cm (previously, 2.7 cm).No mediastinal, hilar, or axillary lymphadenopathy. Lungs/Pleura: The midline trachea and bronchi are patent. Trace left pleural effusion with compressive atelectasis. No focal airspace consolidation or pneumothorax. Musculoskeletal: No acute fracture or destructive bone lesion. Decreased bone mineralization. Multilevel degenerative disc disease of the spine. Upper Abdomen: No acute abnormality in the partially visualized upper abdomen. Cholecystolithiasis. Review of the MIP images confirms the above findings. IMPRESSION: 1. No acute intrathoracic abnormality; specifically, no pulmonary embolism, pneumonia, or pulmonary edema. 2. Trace left pleural effusion with left basilar atelectasis. Electronically Signed   By: Rogelia Myers M.D.   On: 04/29/2024 10:54   DG Chest 2 View Result Date: 04/29/2024 CLINICAL DATA:  Chest pain and shortness of breath. EXAM: CHEST - 2 VIEW COMPARISON:  08/04/2022 FINDINGS: Cardiopericardial silhouette is at upper limits of normal for size. The lungs are clear without focal pneumonia, edema, pneumothorax or pleural effusion. No acute bony abnormality. Telemetry leads overlie the chest. IMPRESSION: No active cardiopulmonary disease. Electronically Signed   By: Camellia Candle M.D.   On: 04/29/2024 06:22     Procedures   Medications Ordered in the ED  iohexol  (OMNIPAQUE ) 350 MG/ML injection 75 mL (75 mLs Intravenous Contrast Given 04/29/24 1003)                                     58yo female with history of asthma, bipolar disorder, OSA who presents with concern for shortness of breath and chest pain.  Differential diagnosis for chest pain/dyspnea includes pulmonary embolus, dissection, pneumothorax, pneumonia, ACS, myocarditis, pericarditis,  anemia, metabolic acidosis, asthma exacerbation, anxiety.  Low clinical concern for aortic dissection. Does not have wheezing on exam to suggest asthma exacerbation.   EKG evaluated by me without signs of STEMI, no pericarditis.   CXR completed and personally interpreted by me and radiology shows no  evidence of pneumonia, pneumothorax, or clear pulmonary edema.   Labs completed and evaluated and interpreted by me show no evidence of UTI, no clinically significant electrolyte abnormalities, no clinically significant anemia. Troponin WNL x2 and doubt ACS.   Given pleuritic chest pain, tachycardia and dyspnea will obtain CT PE study//wells 4.5.  CT PE study shows no acute intrathoracic abnormality, specifically no PE, pneumonia or pulmonary edema.  There is trace pleural effusion with left basilar atelectasis.  Consider the effusion as a possible etiology of her discomfort. Recommend PCP follow up.  She does have a family of early coronary artery disease, and given presence of chest pain described, we will also refer to cardiology.  Discussed reasons to return to the emergency department. Patient discharged in stable condition with understanding of reasons to return.        Final diagnoses:  Shortness of breath  Chest pain, unspecified type  Pleural effusion    ED Discharge Orders          Ordered    Ambulatory referral to Cardiology        04/29/24 1126               Dreama Longs, MD 04/29/24 1127

## 2024-06-15 ENCOUNTER — Encounter: Payer: Self-pay | Admitting: Cardiovascular Disease

## 2024-06-15 ENCOUNTER — Ambulatory Visit: Payer: MEDICAID | Attending: Cardiovascular Disease | Admitting: Cardiovascular Disease

## 2024-06-15 VITALS — BP 110/70 | HR 76 | Ht 68.0 in | Wt 247.8 lb

## 2024-06-15 DIAGNOSIS — I517 Cardiomegaly: Secondary | ICD-10-CM | POA: Insufficient documentation

## 2024-06-15 DIAGNOSIS — R0602 Shortness of breath: Secondary | ICD-10-CM | POA: Insufficient documentation

## 2024-06-15 NOTE — Progress Notes (Signed)
 Cardiology Office Note   Date:  06/15/2024  ID:  [place] SARINANA Stacey [name], DOB [birth year], MRN [id number] PCP: Jerome Heron Ruth, PA-C  [facility] Providers Cardiologist:  None    Allie JULIANNA Horns Stacey [name] is a 58 y.o. female who is being seen today for the evaluation of dyspnea at the request of Jerome Heron Ruth, SHAUNNA*.  History of Present Illness Pami Wool Faydra [name] is a 58 y.o. female referred  for cardiology evaluation due to a strong family history of coronary disease and dyspnea.  Additional medical problems include GERD, obesity, OSA, bipolar 1 disorder, fibromyalgia.  She was seen in the emergency room on April 29, 2024, with shortness of breath. An ECG showed sinus tachycardia with minor nonspecific T wave changes in the anterior leads. A CT angiogram of the chest did not show evidence of pulmonary embolism, and troponin levels were undetectable. A trace left pleural effusion was noted, but no other significant findings for CHF were observed.  On my review of the images there is not a single trace of calcified atherosclerotic plaque in the coronary arteries, thoracic aorta or branches of the aorta in the neck and upper abdomen.  She was prescribed hydrochlorothiazide, a diuretic, for the small pleural effusion at a subsequent follow-up with her PCP.  Shortness of breath improved gradually over a week's time.  Surprisingly, since starting the medication, her excessive thirst, which had persisted for two to three years, has resolved. She would drink two to three 40-ounce bottles of water daily but still feel thirsty.  This is no longer a problem.  She has experienced swelling in her ankles at times.  BNP was marginally abnormal at a different ER visit for shortness of breath on 08/04/2022, at 141.3.  She she believes that she has a strong family history of coronary disease. Her father had atrial fibrillation as a teenager and is currently in his eighties. Her paternal  grandfather experienced heart attacks in his seventies. No personal history of heart attacks or congestive heart failure.   She had an echocardiogram in Jul 19, 2013, which was normal except for a mildly dilated left atrium. She had preeclampsia during her first pregnancy but no issues in subsequent pregnancies.    Studies Reviewed  Echocardiogram Jul 19, 2013 Personally reviewed ECG tracing from 04/29/2024 which shows sinus tachycardia with otherwise normal Extensive review of notes, ECG, imaging studies from ER visit 04/30/2019   Risk Assessment/Calculations           Physical Exam VS:  BP 110/70 (BP Location: Left Arm, Patient Position: Sitting, Cuff Size: Large)   Pulse 76   Ht 5' 8 (1.727 m)   Wt 247 lb 12.8 oz (112.4 kg)   LMP 09/21/2015   SpO2 96%   BMI 37.68 kg/m        Wt Readings from Last 3 Encounters:  06/15/24 247 lb 12.8 oz (112.4 kg)  04/29/24 247 lb 5.7 oz (112.2 kg)  11/09/22 221 lb (100.2 kg)    General: Alert, oriented x3, no distress, obese Head: no evidence of trauma, PERRL, EOMI, no exophtalmos or lid lag, no myxedema, no xanthelasma; normal ears, nose and oropharynx Neck: normal jugular venous pulsations and no hepatojugular reflux; brisk carotid pulses without delay and no carotid bruits Chest: clear to auscultation, no signs of consolidation by percussion or palpation, normal fremitus, symmetrical and full respiratory excursions Cardiovascular: normal position and quality of the apical impulse, regular rhythm, normal first and second heart sounds, no murmurs, rubs or gallops  Abdomen: no tenderness or distention, no masses by palpation, no abnormal pulsatility or arterial bruits, normal bowel sounds, no hepatosplenomegaly Extremities: no clubbing, cyanosis or edema; 2+ radial, ulnar and brachial pulses bilaterally; 2+ right femoral, posterior tibial and dorsalis pedis pulses; 2+ left femoral, posterior tibial and dorsalis pedis pulses; no subclavian or femoral  bruits Neurological: grossly nonfocal Psych: Normal mood and affect  ASSESSMENT AND PLAN  Dyspnea: Transient, associated with sinus tachycardia and a trivial left pleural effusion, resolved gradually after addition of hydrochlorothiazide (not sure that it improved because of the medication).  Currently asymptomatic.  Previous echocardiogram showed a dilated left atrium but otherwise normal findings. No evidence of coronary artery atherosclerosis on imaging.  Possibly had a viral illness causing pleural inflammation and shortness of breath as there were no other signs of congestive heart failure. Ordered echocardiogram to evaluate heart function and assess for any abnormalities. Excessive thirst Reported for the past two to three years, resolved with hydrochlorothiazide. No clear etiology identified. Potassium levels were normal at the time of ER visit.  I guess it is okay to continue hydrochlorothiazide if it alleviates symptoms, but need to ensure adequate intake of potassium-rich foods. BNP levels were slightly abnormal but not indicative of significant heart failure. No hypertension or valve issues identified on remote echocardiogram, but the left atrium was dilated. Possible link to past preeclampsia.        Dispo: Echocardiogram.  Follow-up if there are abnormalities on the echocardiogram.  Signed, Jerel Balding, MD

## 2024-06-15 NOTE — Patient Instructions (Signed)
 Medication Instructions:  Your physician recommends that you continue on your current medications as directed. Please refer to the Current Medication list given to you today.  *If you need a refill on your cardiac medications before your next appointment, please call your pharmacy*  Testing/Procedures: Your physician has requested that you have an echocardiogram. Echocardiography is a painless test that uses sound waves to create images of your heart. It provides your doctor with information about the size and shape of your heart and how well your heart's chambers and valves are working. This procedure takes approximately one hour. There are no restrictions for this procedure. Please do NOT wear cologne, perfume, aftershave, or lotions (deodorant is allowed). Please arrive 15 minutes prior to your appointment time.  Please note: We ask at that you not bring children with you during ultrasound (echo/ vascular) testing. Due to room size and safety concerns, children are not allowed in the ultrasound rooms during exams. Our front office staff cannot provide observation of children in our lobby area while testing is being conducted. An adult accompanying a patient to their appointment will only be allowed in the ultrasound room at the discretion of the ultrasound technician under special circumstances. We apologize for any inconvenience.   Follow-Up: As needed

## 2024-07-21 ENCOUNTER — Ambulatory Visit (HOSPITAL_COMMUNITY)
Admission: RE | Admit: 2024-07-21 | Discharge: 2024-07-21 | Disposition: A | Discharge status: Home / Self Care | Payer: MEDICAID | Source: Ambulatory Visit | Attending: Internal Medicine | Admitting: Internal Medicine

## 2024-07-21 DIAGNOSIS — R0602 Shortness of breath: Secondary | ICD-10-CM | POA: Insufficient documentation

## 2024-07-21 DIAGNOSIS — R9431 Abnormal electrocardiogram [ECG] [EKG]: Secondary | ICD-10-CM | POA: Diagnosis not present

## 2024-07-22 LAB — ECHOCARDIOGRAM COMPLETE
Area-P 1/2: 4.76 cm2
S' Lateral: 2.5 cm

## 2024-07-24 ENCOUNTER — Ambulatory Visit: Payer: Self-pay | Admitting: Cardiovascular Disease

## 2024-08-11 ENCOUNTER — Encounter: Payer: Self-pay | Admitting: Oral Surgery

## 2024-08-12 ENCOUNTER — Other Ambulatory Visit: Payer: Self-pay | Admitting: Oral Surgery

## 2024-08-13 LAB — SURGICAL PATHOLOGY

## 2024-08-18 ENCOUNTER — Encounter: Payer: Self-pay | Admitting: Oral Surgery
